# Patient Record
Sex: Male | Born: 1975 | Race: Black or African American | Hispanic: No | Marital: Single
Health system: Southern US, Community
[De-identification: ages and names within clinical notes are randomized; demographics above are authoritative.]

---

## 2020-06-29 ENCOUNTER — Inpatient Hospital Stay
Admission: RE | Admit: 2020-06-29 | Discharge: 2020-07-17 | Disposition: A | Discharge status: Other Acute Inpatient Hospital | Payer: Medicare Other | Source: Other Acute Inpatient Hospital | Attending: Internal Medicine | Admitting: Internal Medicine

## 2020-06-29 ENCOUNTER — Other Ambulatory Visit (HOSPITAL_COMMUNITY): Payer: Medicare Other

## 2020-06-29 DIAGNOSIS — I5032 Chronic diastolic (congestive) heart failure: Secondary | ICD-10-CM

## 2020-06-29 DIAGNOSIS — J9621 Acute and chronic respiratory failure with hypoxia: Secondary | ICD-10-CM

## 2020-06-29 DIAGNOSIS — Z992 Dependence on renal dialysis: Secondary | ICD-10-CM

## 2020-06-29 DIAGNOSIS — Z4659 Encounter for fitting and adjustment of other gastrointestinal appliance and device: Secondary | ICD-10-CM

## 2020-06-29 DIAGNOSIS — Z452 Encounter for adjustment and management of vascular access device: Secondary | ICD-10-CM

## 2020-06-29 DIAGNOSIS — N186 End stage renal disease: Secondary | ICD-10-CM

## 2020-06-29 DIAGNOSIS — J9690 Respiratory failure, unspecified, unspecified whether with hypoxia or hypercapnia: Secondary | ICD-10-CM

## 2020-06-29 DIAGNOSIS — J181 Lobar pneumonia, unspecified organism: Secondary | ICD-10-CM

## 2020-06-29 DIAGNOSIS — G9341 Metabolic encephalopathy: Secondary | ICD-10-CM

## 2020-06-29 LAB — CBC
HCT: 28.2 % — ABNORMAL LOW (ref 39.0–52.0)
Hemoglobin: 8.7 g/dL — ABNORMAL LOW (ref 13.0–17.0)
MCH: 21.9 pg — ABNORMAL LOW (ref 26.0–34.0)
MCHC: 30.9 g/dL (ref 30.0–36.0)
MCV: 70.9 fL — ABNORMAL LOW (ref 80.0–100.0)
Platelets: 134 10*3/uL — ABNORMAL LOW (ref 150–400)
RBC: 3.98 MIL/uL — ABNORMAL LOW (ref 4.22–5.81)
RDW: 15.2 % (ref 11.5–15.5)
WBC: 12.6 10*3/uL — ABNORMAL HIGH (ref 4.0–10.5)
nRBC: 0 % (ref 0.0–0.2)

## 2020-06-29 LAB — COMPREHENSIVE METABOLIC PANEL
ALT: 24 U/L (ref 0–44)
AST: 33 U/L (ref 15–41)
Albumin: 2.2 g/dL — ABNORMAL LOW (ref 3.5–5.0)
Alkaline Phosphatase: 58 U/L (ref 38–126)
Anion gap: 17 — ABNORMAL HIGH (ref 5–15)
BUN: 73 mg/dL — ABNORMAL HIGH (ref 6–20)
CO2: 26 mmol/L (ref 22–32)
Calcium: 8.5 mg/dL — ABNORMAL LOW (ref 8.9–10.3)
Chloride: 83 mmol/L — ABNORMAL LOW (ref 98–111)
Creatinine, Ser: 8.05 mg/dL — ABNORMAL HIGH (ref 0.61–1.24)
GFR, Estimated: 8 mL/min — ABNORMAL LOW (ref >60)
Glucose, Bld: 129 mg/dL — ABNORMAL HIGH (ref 70–99)
Potassium: 4.7 mmol/L (ref 3.5–5.1)
Sodium: 126 mmol/L — ABNORMAL LOW (ref 135–145)
Total Bilirubin: 0.6 mg/dL (ref 0.3–1.2)
Total Protein: 6 g/dL — ABNORMAL LOW (ref 6.5–8.1)

## 2020-06-29 LAB — BLOOD GAS, ARTERIAL
Acid-Base Excess: 2.8 mmol/L — ABNORMAL HIGH (ref 0.0–2.0)
Bicarbonate: 26.4 mmol/L (ref 20.0–28.0)
FIO2: 28
O2 Saturation: 98.5 %
Patient temperature: 36.7
pCO2 arterial: 37.2 mmHg (ref 32.0–48.0)
pH, Arterial: 7.463 — ABNORMAL HIGH (ref 7.350–7.450)
pO2, Arterial: 115 mmHg — ABNORMAL HIGH (ref 83.0–108.0)

## 2020-06-29 LAB — TSH: TSH: 5.995 u[IU]/mL — ABNORMAL HIGH (ref 0.350–4.500)

## 2020-06-30 DIAGNOSIS — J9621 Acute and chronic respiratory failure with hypoxia: Secondary | ICD-10-CM | POA: Diagnosis not present

## 2020-06-30 DIAGNOSIS — N186 End stage renal disease: Secondary | ICD-10-CM | POA: Diagnosis not present

## 2020-06-30 DIAGNOSIS — I5032 Chronic diastolic (congestive) heart failure: Secondary | ICD-10-CM

## 2020-06-30 DIAGNOSIS — G9341 Metabolic encephalopathy: Secondary | ICD-10-CM | POA: Diagnosis not present

## 2020-06-30 DIAGNOSIS — J181 Lobar pneumonia, unspecified organism: Secondary | ICD-10-CM

## 2020-06-30 DIAGNOSIS — Z992 Dependence on renal dialysis: Secondary | ICD-10-CM

## 2020-06-30 LAB — HEPATITIS B CORE ANTIBODY, TOTAL: Hep B Core Total Ab: NONREACTIVE

## 2020-06-30 LAB — HEPATITIS B SURFACE ANTIGEN: Hepatitis B Surface Ag: NONREACTIVE

## 2020-06-30 NOTE — Consult Note (Signed)
[facility] [place]  Date of Service: 06/30/2020  PULMONARY CRITICAL CARE CONSULT   DRIN KISHIMOTO  [id number]  DOB: 07-14-1975   DOA: 06/29/2020  Referring Physician: [name], MD  HPI: Jeffery Andrews is a 45 y.o. male seen for follow up of Acute on Chronic Respiratory Failure.  Patient has multiple medical problems including end-stage renal disease congestive heart failure hypertension diabetes came into the hospital because of altered mental status.  At the time of evaluation patient was intubated placed on mechanical ventilation was noted to be in hyperglycemia.  Patient had a prolonged course ended up remaining on the ventilator and was not able to come off so therefore transferred to our facility for further management.  At time of evaluation here patient is on full support and respiratory therapy is check the RSB and mechanics to try to start with the weaning.  Review of Systems:  ROS performed and is unremarkable other than noted above.  Past medical history: Hypertension Congestive heart failure Diabetes mellitus End-stage renal disease Encephalopathy  Social history: Unknown tobacco alcohol drug use  Family history: Noncontributory to present illness  Medications: Reviewed on Rounds  Physical Exam:  Vitals: Temperature is 97.7 pulse 67 respiratory 14 blood pressure is 1.7/67 saturations 100%  Ventilator Settings on pressure support FiO2 is 28% pressure 12/5  General: Comfortable at this time Eyes: Grossly normal lids, irises & conjunctiva ENT: grossly tongue is normal Neck: no obvious mass Cardiovascular: S1-S2 normal no gallop or rub Respiratory: No rhonchi very coarse breath Abdomen: Soft and nontender Skin: no rash seen on limited exam Musculoskeletal: not rigid Psychiatric:unable to assess Neurologic: no seizure no involuntary movements         Labs on Admission:  Basic  Metabolic Panel: Recent Labs  Lab 06/29/20 2137  NA 126*  K 4.7  CL 83*  CO2 26  GLUCOSE 129*  BUN 73*  CREATININE 8.05*  CALCIUM 8.5*    Recent Labs  Lab 06/29/20 2220  PHART 7.463*  PCO2ART 37.2  PO2ART 115*  HCO3 26.4  O2SAT 98.5    Liver Function Tests: Recent Labs  Lab 06/29/20 2137  AST 33  ALT 24  ALKPHOS 58  BILITOT 0.6  PROT 6.0*  ALBUMIN 2.2*   No results for input(s): LIPASE, AMYLASE in the last 168 hours. No results for input(s): AMMONIA in the last 168 hours.  CBC: Recent Labs  Lab 06/29/20 2137  WBC 12.6*  HGB 8.7*  HCT 28.2*  MCV 70.9*  PLT 134*    Cardiac Enzymes: No results for input(s): CKTOTAL, CKMB, CKMBINDEX, TROPONINI in the last 168 hours.  BNP (last 3 results) No results for input(s): BNP in the last 8760 hours.  ProBNP (last 3 results) No results for input(s): PROBNP in the last 8760 hours.   Radiological Exams on Admission: DG CHEST PORT 1 VIEW  Result Date: 06/29/2020 CLINICAL DATA:  Intubation EXAM: PORTABLE CHEST - 1 VIEW PORTABLE ABDOMEN - 1 VIEW COMPARISON:  None. FINDINGS: *Endotracheal tube tip terminates 5.8 cm from the carina. *Transesophageal tube tip and side port are distal to the GE junction, coiling in the gastric lumen with the tip redirected towards the gastric fundus. *Telemetry leads overlie the chest. Patchy ill-defined opacities are present in the right lung base, could early airspace disease or atelectasis. No pneumothorax or visible effusion. Cardiomediastinal contours are unremarkable for the portable technique. No suspicious abdominal calcifications or obstructive bowel gas pattern within  the included portions of the upper abdomen. No acute osseous abnormality or suspicious osseous lesion. IMPRESSION: Lines and tubes as above. Patchy opacity in the right lower lung, possible atelectasis or early consolidation. Electronically Signed   By: Lovena Le M.D.   On: 06/29/2020 22:20   DG Abd Portable  1V  Result Date: 06/29/2020 CLINICAL DATA:  Intubation EXAM: PORTABLE CHEST - 1 VIEW PORTABLE ABDOMEN - 1 VIEW COMPARISON:  None. FINDINGS: *Endotracheal tube tip terminates 5.8 cm from the carina. *Transesophageal tube tip and side port are distal to the GE junction, coiling in the gastric lumen with the tip redirected towards the gastric fundus. *Telemetry leads overlie the chest. Patchy ill-defined opacities are present in the right lung base, could early airspace disease or atelectasis. No pneumothorax or visible effusion. Cardiomediastinal contours are unremarkable for the portable technique. No suspicious abdominal calcifications or obstructive bowel gas pattern within the included portions of the upper abdomen. No acute osseous abnormality or suspicious osseous lesion. IMPRESSION: Lines and tubes as above. Patchy opacity in the right lower lung, possible atelectasis or early consolidation. Electronically Signed   By: Lovena Le M.D.   On: 06/29/2020 22:20    Assessment/Plan Active Problems:   Acute on chronic respiratory failure with hypoxia (HCC)   End stage renal disease on dialysis (HCC)   Lobar pneumonia, unspecified organism (HCC)   Acute metabolic encephalopathy   Chronic diastolic CHF (congestive heart failure) (HCC)   Acute on chronic respiratory failure with hypoxia plan is going to be to continue with pressure support weaning right now is on pressure support of 12/5 goal is for 2 hours today to start we will see how the patient does with the attempts at weaning. End-stage renal disease will be continued on hemodialysis nephrology will be asked to see the patient. Chronic heart failure diastolic continue to monitor patient's fluid status closely diurese as tolerated. Lobar pneumonia chest x-ray shows patchy infiltrates in the right lower lung zone.  Patient has been treated with antibiotics.  Will discuss further with primary team. Metabolic encephalopathy overall no change we will  continue with supportive care  I have personally seen and evaluated the patient, evaluated laboratory and imaging results, formulated the assessment and plan and placed orders. The Patient requires high complexity decision making with multiple systems involvement.  Case was discussed on Rounds with the Respiratory Therapy Director and the Respiratory staff Time Spent 61mnutes  Raileigh Sabater A Kesler Wickham, MD [facility] CenterPulmonary [facility] Medicine Sleep Medicine

## 2020-07-01 ENCOUNTER — Other Ambulatory Visit (HOSPITAL_COMMUNITY): Payer: Medicare Other

## 2020-07-01 DIAGNOSIS — G9341 Metabolic encephalopathy: Secondary | ICD-10-CM | POA: Diagnosis not present

## 2020-07-01 DIAGNOSIS — J9621 Acute and chronic respiratory failure with hypoxia: Secondary | ICD-10-CM | POA: Diagnosis not present

## 2020-07-01 DIAGNOSIS — I5032 Chronic diastolic (congestive) heart failure: Secondary | ICD-10-CM

## 2020-07-01 DIAGNOSIS — J181 Lobar pneumonia, unspecified organism: Secondary | ICD-10-CM

## 2020-07-01 DIAGNOSIS — N186 End stage renal disease: Secondary | ICD-10-CM

## 2020-07-01 LAB — HEMOGLOBIN A1C
Hgb A1c MFr Bld: 13.5 % — ABNORMAL HIGH (ref 4.8–5.6)
Mean Plasma Glucose: 341 mg/dL

## 2020-07-01 LAB — HEPATITIS B SURFACE ANTIBODY,QUALITATIVE: Hep B S Ab: REACTIVE — AB

## 2020-07-01 NOTE — Consult Note (Signed)
[facility] CONSULT NOTE    Date: 07/01/2020                  Patient Name:  Jeffery Andrews  MRN: [id number]  DOB: September 22, 1975  Age / Sex: 45 y.o., male         PCP: Jacqualine Code, DO                 Service Requesting Consult: Hospitalist                 Reason for Consult: Evaluation management of ESRD            History of Present Illness: Patient is a 45 y.o. male with a PMHx of ESRD on HD, posterior reversible encephalopathy syndrome, diabetes mellitus type 2, chronic systolic heart failure, anemia of chronic kidney disease, secondary hyperparathyroidism, malignant hypertension, acute respiratory failure, who was admitted to [facility] on 06/29/2020 for ongoing treatment of acute respiratory failure, posterior reversible encephalopathy syndrome, and end-stage renal disease.  Patient was admitted to [facility] through the emergency department for elevated blood sugar and confusion.  His initial blood pressure there was 289/152.  Given worsening agitation and confusion he had to be intubated.  He was admitted to the intensive care unit.  He was placed on a Cardene drip initially.  He was also found to have labile blood sugars.  CT of the brain on 06/24/2020 showed cerebral edema.  In addition imaging showed probable posterior reversible encephalopathy syndrome.  His hemodialysis treatments were continued.   Medications: Sliding scale insulin with Humalog, amlodipine 10 mg daily, clonidine 0.1 mg nightly, famotidine 10 mg daily, heparin 5000 units subcutaneous every 12 hours, hydralazine 100 mg 3 times daily, Juven 1 packet 3 times daily, labetalol 200 mg twice daily, Lantus 20 units nightly, losartan 50 mg daily, sertraline 200 mg daily   Allergies: No known drug allergies   Past Medical History: ESRD on HD, posterior reversible encephalopathy syndrome, diabetes mellitus type 2, chronic systolic heart failure, anemia of  chronic kidney disease, secondary hyperparathyroidism, malignant hypertension, acute respiratory failure   Past Surgical History: Status post AV fistula placement  Family History: Unable to obtain as the patient is currently intubated.   Social History: Unable to obtain as the patient is currently intubated.  Review of Systems: Unable to obtain as the patient is currently intubated.  Vital Signs: Temperature nine 9.1 pulse 66 respirations 15 blood pressure 161/79  Weight trends: There were no vitals filed for this visit.  Physical Exam: Physical Exam: General: Critically ill-appearing  Head:  Normocephalic, atraumatic.  Endotracheal tube in place  Eyes:  Anicteric  Neck:  Supple  Lungs:   Clear to auscultation, normal effort  Heart:  S1S2 no rubs  Abdomen:   Soft, nontender, bowel sounds present  Extremities: Trace peripheral edema.  Neurologic: Eyes are open however patient not following commands  Skin:  No acute skin rash  Access: AV fistula in place    Lab results: Basic Metabolic Panel: Recent Labs  Lab 06/29/20 2137  NA 126*  K 4.7  CL 83*  CO2 26  GLUCOSE 129*  BUN 73*  CREATININE 8.05*  CALCIUM 8.5*    Liver Function Tests: Recent Labs  Lab 06/29/20 2137  AST 33  ALT 24  ALKPHOS 58  BILITOT 0.6  PROT 6.0*  ALBUMIN 2.2*   No results for input(s): LIPASE, AMYLASE in the last 168 hours. No results for  input(s): AMMONIA in the last 168 hours.  CBC: Recent Labs  Lab 06/29/20 2137  WBC 12.6*  HGB 8.7*  HCT 28.2*  MCV 70.9*  PLT 134*    Cardiac Enzymes: No results for input(s): CKTOTAL, CKMB, CKMBINDEX, TROPONINI in the last 168 hours.  BNP: Invalid input(s): POCBNP  CBG: No results for input(s): GLUCAP in the last 168 hours.  Microbiology: Results for orders placed or performed during the hospital encounter of 06/29/20  Culture, Respiratory w Gram Stain     Status: None (Preliminary result)   Collection Time: 06/30/20  4:00 PM    Specimen: Tracheal Aspirate; Respiratory  Result Value Ref Range Status   Specimen Description TRACHEAL ASPIRATE  Final   Special Requests NONE  Final   Gram Stain   Final    FEW WBC PRESENT,BOTH PMN AND MONONUCLEAR MODERATE GRAM NEGATIVE COCCOBACILLI RARE GRAM POSITIVE COCCI IN PAIRS FEW GRAM POSITIVE RODS Performed at [facility], [place] [address] [place], [place] [address]    Culture PENDING  Incomplete   Report Status PENDING  Incomplete    Coagulation Studies: No results for input(s): LABPROT, INR in the last 72 hours.  Urinalysis: No results for input(s): COLORURINE, LABSPEC, PHURINE, GLUCOSEU, HGBUR, BILIRUBINUR, KETONESUR, PROTEINUR, UROBILINOGEN, NITRITE, LEUKOCYTESUR in the last 72 hours.  Invalid input(s): APPERANCEUR    Imaging: DG CHEST PORT 1 VIEW  Result Date: 06/29/2020 CLINICAL DATA:  Intubation EXAM: PORTABLE CHEST - 1 VIEW PORTABLE ABDOMEN - 1 VIEW COMPARISON:  None. FINDINGS: *Endotracheal tube tip terminates 5.8 cm from the carina. *Transesophageal tube tip and side port are distal to the GE junction, coiling in the gastric lumen with the tip redirected towards the gastric fundus. *Telemetry leads overlie the chest. Patchy ill-defined opacities are present in the right lung base, could early airspace disease or atelectasis. No pneumothorax or visible effusion. Cardiomediastinal contours are unremarkable for the portable technique. No suspicious abdominal calcifications or obstructive bowel gas pattern within the included portions of the upper abdomen. No acute osseous abnormality or suspicious osseous lesion. IMPRESSION: Lines and tubes as above. Patchy opacity in the right lower lung, possible atelectasis or early consolidation. Electronically Signed   By: Lovena Le M.D.   On: 06/29/2020 22:20   DG Abd Portable 1V  Result Date: 06/29/2020 CLINICAL DATA:  Intubation EXAM: PORTABLE CHEST - 1 VIEW PORTABLE ABDOMEN - 1 VIEW COMPARISON:  None. FINDINGS:  *Endotracheal tube tip terminates 5.8 cm from the carina. *Transesophageal tube tip and side port are distal to the GE junction, coiling in the gastric lumen with the tip redirected towards the gastric fundus. *Telemetry leads overlie the chest. Patchy ill-defined opacities are present in the right lung base, could early airspace disease or atelectasis. No pneumothorax or visible effusion. Cardiomediastinal contours are unremarkable for the portable technique. No suspicious abdominal calcifications or obstructive bowel gas pattern within the included portions of the upper abdomen. No acute osseous abnormality or suspicious osseous lesion. IMPRESSION: Lines and tubes as above. Patchy opacity in the right lower lung, possible atelectasis or early consolidation. Electronically Signed   By: Lovena Le M.D.   On: 06/29/2020 22:20     Assessment & Plan: Pt is a 45 y.o. male with a PMHx of ESRD on HD, posterior reversible encephalopathy syndrome, diabetes mellitus type 2, chronic systolic heart failure, anemia of chronic kidney disease, secondary hyperparathyroidism, malignant hypertension, acute respiratory failure, who was admitted to [facility] on 06/29/2020 for ongoing treatment of acute respiratory failure, posterior  reversible encephalopathy syndrome, and end-stage renal disease.   1.  ESRD on HD.  We will maintain the patient on TTS dialysis schedule while here at our facility.  Continue to use AV fistula.  2.  Anemia of chronic kidney disease.  Hemoglobin currently 8.7.  Start the patient on Retacrit 4000 units IV with dialysis treatments.  3.  Secondary hyperparathyroidism.  Monitor calcium, phosphorus over the course of the hospitalization.  Not on binders at the moment.  4.  Acute respiratory failure.  Maintained on ventilatory support.  Weaning as per pulmonary/critical care.  Encephalopathy likely playing some role in the ability to extubate the patient.  5.  Hypertension.  Currently on  amlodipine, clonidine, hydralazine, labetalol, and losartan.  Monitor blood pressure trend.

## 2020-07-01 NOTE — Progress Notes (Signed)
Jeffery Andrews   [facility] CRITICAL CARE SERVICE  PROGRESS NOTE     Jeffery Andrews  [id number]  DOB: 02-18-1975   DOA: 06/29/2020  Referring Physician: [name], MD  HPI: Jeffery Andrews is a 45 y.o. male being followed for ventilator/airway/oxygen weaning Acute on Chronic Respiratory Failure.  Patient is currently on pressure support 10/5 good volumes are noted.  Respiratory therapy is going to advance the weaning further  Medications: Reviewed on Rounds  Physical Exam:  Vitals: Temperature is 99.1 pulse 66 respiratory 15 blood pressure is 161/79 saturations 100%  Ventilator Settings on pressure support 10/5  General: Comfortable at this time Neck: supple Cardiovascular: no malignant arrhythmias Respiratory: No rhonchi very coarse breath sound Skin: no rash seen on limited exam Musculoskeletal: No gross abnormality Psychiatric:unable to assess Neurologic:no involuntary movements         Lab Data:   Basic Metabolic Panel: Recent Labs  Lab 06/29/20 2137  NA 126*  K 4.7  CL 83*  CO2 26  GLUCOSE 129*  BUN 73*  CREATININE 8.05*  CALCIUM 8.5*    ABG: Recent Labs  Lab 06/29/20 2220  PHART 7.463*  PCO2ART 37.2  PO2ART 115*  HCO3 26.4  O2SAT 98.5    Liver Function Tests: Recent Labs  Lab 06/29/20 2137  AST 33  ALT 24  ALKPHOS 58  BILITOT 0.6  PROT 6.0*  ALBUMIN 2.2*   No results for input(s): LIPASE, AMYLASE in the last 168 hours. No results for input(s): AMMONIA in the last 168 hours.  CBC: Recent Labs  Lab 06/29/20 2137  WBC 12.6*  HGB 8.7*  HCT 28.2*  MCV 70.9*  PLT 134*    Cardiac Enzymes: No results for input(s): CKTOTAL, CKMB, CKMBINDEX, TROPONINI in the last 168 hours.  BNP (last 3 results) No results for input(s): BNP in the last 8760 hours.  ProBNP (last 3 results) No results for input(s): PROBNP in the last 8760 hours.  Radiological Exams: DG CHEST PORT 1  VIEW  Result Date: 06/29/2020 CLINICAL DATA:  Intubation EXAM: PORTABLE CHEST - 1 VIEW PORTABLE ABDOMEN - 1 VIEW COMPARISON:  None. FINDINGS: *Endotracheal tube tip terminates 5.8 cm from the carina. *Transesophageal tube tip and side port are distal to the GE junction, coiling in the gastric lumen with the tip redirected towards the gastric fundus. *Telemetry leads overlie the chest. Patchy ill-defined opacities are present in the right lung base, could early airspace disease or atelectasis. No pneumothorax or visible effusion. Cardiomediastinal contours are unremarkable for the portable technique. No suspicious abdominal calcifications or obstructive bowel gas pattern within the included portions of the upper abdomen. No acute osseous abnormality or suspicious osseous lesion. IMPRESSION: Lines and tubes as above. Patchy opacity in the right lower lung, possible atelectasis or early consolidation. Electronically Signed   By: Lovena Le M.D.   On: 06/29/2020 22:20   DG Abd Portable 1V  Result Date: 06/29/2020 CLINICAL DATA:  Intubation EXAM: PORTABLE CHEST - 1 VIEW PORTABLE ABDOMEN - 1 VIEW COMPARISON:  None. FINDINGS: *Endotracheal tube tip terminates 5.8 cm from the carina. *Transesophageal tube tip and side port are distal to the GE junction, coiling in the gastric lumen with the tip redirected towards the gastric fundus. *Telemetry leads overlie the chest. Patchy ill-defined opacities are present in the right lung base, could early airspace disease or atelectasis. No pneumothorax or visible effusion. Cardiomediastinal contours are unremarkable for the portable technique. No suspicious abdominal calcifications or  obstructive bowel gas pattern within the included portions of the upper abdomen. No acute osseous abnormality or suspicious osseous lesion. IMPRESSION: Lines and tubes as above. Patchy opacity in the right lower lung, possible atelectasis or early consolidation. Electronically Signed   By: Lovena Le M.D.   On: 06/29/2020 22:20    Assessment/Plan Active Problems:   Acute on chronic respiratory failure with hypoxia (HCC)   End stage renal disease on dialysis (HCC)   Lobar pneumonia, unspecified organism (HCC)   Acute metabolic encephalopathy   Chronic diastolic CHF (congestive heart failure) (HCC)   Acute on chronic respiratory failure hypoxia we will continue with pressure support 10/5 tidal volume 550 as tolerated.  Patient should be able to advance to T collar soon End-stage renal failure on hemodialysis being seen by nephrology we will continue with their recommendations Metabolic encephalopathy no change Chronic diastolic heart failure appears to be compensated at this time. Lobar pneumonia chest x-ray results as noted above   I have personally seen and evaluated the patient, evaluated laboratory and imaging results, formulated the assessment and plan and placed orders. The Patient requires high complexity decision making with multiple systems involvement.  Rounds were done with the Respiratory Therapy Director and Staff therapists and discussed with nursing staff also.  Jeffery Gee, MD [facility] Medicine Sleep Medicine

## 2020-07-02 ENCOUNTER — Other Ambulatory Visit (HOSPITAL_COMMUNITY): Payer: Medicare Other

## 2020-07-02 LAB — RENAL FUNCTION PANEL
Albumin: 2 g/dL — ABNORMAL LOW (ref 3.5–5.0)
Anion gap: 15 (ref 5–15)
BUN: 92 mg/dL — ABNORMAL HIGH (ref 6–20)
CO2: 24 mmol/L (ref 22–32)
Calcium: 8.4 mg/dL — ABNORMAL LOW (ref 8.9–10.3)
Chloride: 90 mmol/L — ABNORMAL LOW (ref 98–111)
Creatinine, Ser: 7.34 mg/dL — ABNORMAL HIGH (ref 0.61–1.24)
GFR, Estimated: 9 mL/min — ABNORMAL LOW (ref >60)
Glucose, Bld: 77 mg/dL (ref 70–99)
Phosphorus: 4.9 mg/dL — ABNORMAL HIGH (ref 2.5–4.6)
Potassium: 5.1 mmol/L (ref 3.5–5.1)
Sodium: 129 mmol/L — ABNORMAL LOW (ref 135–145)

## 2020-07-02 LAB — CBC
HCT: 28.6 % — ABNORMAL LOW (ref 39.0–52.0)
Hemoglobin: 8.8 g/dL — ABNORMAL LOW (ref 13.0–17.0)
MCH: 22.1 pg — ABNORMAL LOW (ref 26.0–34.0)
MCHC: 30.8 g/dL (ref 30.0–36.0)
MCV: 71.7 fL — ABNORMAL LOW (ref 80.0–100.0)
Platelets: 167 10*3/uL (ref 150–400)
RBC: 3.99 MIL/uL — ABNORMAL LOW (ref 4.22–5.81)
RDW: 15.4 % (ref 11.5–15.5)
WBC: 15 10*3/uL — ABNORMAL HIGH (ref 4.0–10.5)
nRBC: 0 % (ref 0.0–0.2)

## 2020-07-02 NOTE — Progress Notes (Signed)
  Echocardiogram 2D Echocardiogram with 3D has been performed. Dr. Doylene Canard contacted at 11:12 to read echo.  Jeffery Andrews M 07/02/2020, 11:13 AM

## 2020-07-03 DIAGNOSIS — J9621 Acute and chronic respiratory failure with hypoxia: Secondary | ICD-10-CM | POA: Diagnosis not present

## 2020-07-03 DIAGNOSIS — I5032 Chronic diastolic (congestive) heart failure: Secondary | ICD-10-CM | POA: Diagnosis not present

## 2020-07-03 DIAGNOSIS — G9341 Metabolic encephalopathy: Secondary | ICD-10-CM | POA: Diagnosis not present

## 2020-07-03 DIAGNOSIS — N186 End stage renal disease: Secondary | ICD-10-CM | POA: Diagnosis not present

## 2020-07-03 LAB — ECHOCARDIOGRAM COMPLETE
Area-P 1/2: 3.15 cm2
S' Lateral: 2.7 cm

## 2020-07-03 NOTE — Progress Notes (Signed)
[facility] [place]   PULMONARY CRITICAL CARE SERVICE  PROGRESS NOTE     Jeffery Andrews  [id number]  DOB: Mar 11, 1975   DOA: 06/29/2020  Referring Physician: [name], MD  HPI: Jeffery Andrews is a 45 y.o. male being followed for ventilator/airway/oxygen weaning Acute on Chronic Respiratory Failure.  Patient is currently on pressure support has been on 28% FiO2 and a pressure of 10/5 remains nonverbal and nonresponsive  Medications: Reviewed on Rounds  Physical Exam:  Vitals: Temperature 98.0 pulse 69 respiratory rate is 17 blood pressure 166/80 saturations 100%  Ventilator Settings on pressure support FiO2 28% pressure 10/5  General: Comfortable at this time Neck: supple Cardiovascular: no malignant arrhythmias Respiratory: No rhonchi or rales are noted at this time Skin: no rash seen on limited exam Musculoskeletal: No gross abnormality Psychiatric:unable to assess Neurologic:no involuntary movements         Lab Data:   Basic Metabolic Panel: Recent Labs  Lab 06/29/20 2137 07/02/20 1221  NA 126* 129*  K 4.7 5.1  CL 83* 90*  CO2 26 24  GLUCOSE 129* 77  BUN 73* 92*  CREATININE 8.05* 7.34*  CALCIUM 8.5* 8.4*  PHOS  --  4.9*    ABG: Recent Labs  Lab 06/29/20 2220  PHART 7.463*  PCO2ART 37.2  PO2ART 115*  HCO3 26.4  O2SAT 98.5    Liver Function Tests: Recent Labs  Lab 06/29/20 2137 07/02/20 1221  AST 33  --   ALT 24  --   ALKPHOS 58  --   BILITOT 0.6  --   PROT 6.0*  --   ALBUMIN 2.2* 2.0*   No results for input(s): LIPASE, AMYLASE in the last 168 hours. No results for input(s): AMMONIA in the last 168 hours.  CBC: Recent Labs  Lab 06/29/20 2137 07/02/20 1221  WBC 12.6* 15.0*  HGB 8.7* 8.8*  HCT 28.2* 28.6*  MCV 70.9* 71.7*  PLT 134* 167    Cardiac Enzymes: No results for input(s): CKTOTAL, CKMB, CKMBINDEX, TROPONINI in the last 168 hours.  BNP (last 3 results) No  results for input(s): BNP in the last 8760 hours.  ProBNP (last 3 results) No results for input(s): PROBNP in the last 8760 hours.  Radiological Exams: No results found.  Assessment/Plan Active Problems:   Acute on chronic respiratory failure with hypoxia (HCC)   End stage renal disease on dialysis (HCC)   Lobar pneumonia, unspecified organism (HCC)   Acute metabolic encephalopathy   Chronic diastolic CHF (congestive heart failure) (HCC)   Acute on chronic respiratory failure hypoxia plan is to continue with pressure support titrate oxygen as tolerated continue pulmonary toilet patient remains nonresponsive and there is a question whether they will be able to actually protect airway will likely end up needing to have a tracheostomy End-stage renal disease on hemodialysis patient is being seen by nephrology Lobar pneumonia has been treated with antibiotics we will continue with supportive care Metabolic encephalopathy remains nonresponsive Chronic diastolic heart failure echocardiogram was done   I have personally seen and evaluated the patient, evaluated laboratory and imaging results, formulated the assessment and plan and placed orders. The Patient requires high complexity decision making with multiple systems involvement.  Rounds were done with the Respiratory Therapy Director and Staff therapists and discussed with nursing staff also.  Allyne Gee, MD [facility] Pulmonary Critical Care Medicine Sleep Medicine

## 2020-07-03 NOTE — Progress Notes (Signed)
[facility]  ROUNDING NOTE   Subjective:  Patient completed dialysis early this AM. Still on the ventilator. Blood pressure currently 166/80.   Objective:  Vital signs in last 24 hours:  Temperature 97 pulse 69 respirations 14 blood pressure 166/80   Physical Exam: General:  Critically ill-appearing  Head:  Normocephalic, atraumatic.  Endotracheal tube in place  Eyes:  Anicteric  Neck:  Supple  Lungs:   Clear to auscultation, vent assisted  Heart:  S1S2 no rubs  Abdomen:   Soft, nontender, bowel sounds present  Extremities:  Trace peripheral edema.  Neurologic:  Intubated, not following commands  Skin:  No acute skin rash  Access:  Left upper extremity AV fistula    Basic Metabolic Panel: Recent Labs  Lab 06/29/20 2137 07/02/20 1221  NA 126* 129*  K 4.7 5.1  CL 83* 90*  CO2 26 24  GLUCOSE 129* 77  BUN 73* 92*  CREATININE 8.05* 7.34*  CALCIUM 8.5* 8.4*  PHOS  --  4.9*    Liver Function Tests: Recent Labs  Lab 06/29/20 2137 07/02/20 1221  AST 33  --   ALT 24  --   ALKPHOS 58  --   BILITOT 0.6  --   PROT 6.0*  --   ALBUMIN 2.2* 2.0*   No results for input(s): LIPASE, AMYLASE in the last 168 hours. No results for input(s): AMMONIA in the last 168 hours.  CBC: Recent Labs  Lab 06/29/20 2137 07/02/20 1221  WBC 12.6* 15.0*  HGB 8.7* 8.8*  HCT 28.2* 28.6*  MCV 70.9* 71.7*  PLT 134* 167    Cardiac Enzymes: No results for input(s): CKTOTAL, CKMB, CKMBINDEX, TROPONINI in the last 168 hours.  BNP: Invalid input(s): POCBNP  CBG: No results for input(s): GLUCAP in the last 168 hours.  Microbiology: Results for orders placed or performed during the hospital encounter of 06/29/20  Culture, Respiratory w Gram Stain     Status: None (Preliminary result)   Collection Time: 06/30/20  4:00 PM   Specimen: Tracheal Aspirate; Respiratory  Result Value Ref Range Status   Specimen Description TRACHEAL ASPIRATE  Final   Special Requests NONE   Final   Gram Stain   Final    FEW WBC PRESENT,BOTH PMN AND MONONUCLEAR MODERATE GRAM NEGATIVE COCCOBACILLI RARE GRAM POSITIVE COCCI IN PAIRS FEW GRAM POSITIVE RODS    Culture   Final    ABUNDANT HAEMOPHILUS INFLUENZAE CULTURE REINCUBATED FOR BETTER GROWTH Performed at [facility], [place] [address] [place], [place] [address]    Report Status PENDING  Incomplete    Coagulation Studies: No results for input(s): LABPROT, INR in the last 72 hours.  Urinalysis: No results for input(s): COLORURINE, LABSPEC, PHURINE, GLUCOSEU, HGBUR, BILIRUBINUR, KETONESUR, PROTEINUR, UROBILINOGEN, NITRITE, LEUKOCYTESUR in the last 72 hours.  Invalid input(s): APPERANCEUR    Imaging: No results found.   Medications:       Assessment/ Plan:  45 y.o. male with a PMHx of ESRD on HD, posterior reversible encephalopathy syndrome, diabetes mellitus type 2, chronic systolic heart failure, anemia of chronic kidney disease, secondary hyperparathyroidism, malignant hypertension, acute respiratory failure, who was admitted to [facility] on 06/29/2020 for ongoing treatment of acute respiratory failure, posterior reversible encephalopathy syndrome, and end-stage renal disease.   1.  ESRD on HD.  We will plan to maintain the patient on TTS dialysis schedule.  Next dialysis treatment for tomorrow.  2.  Anemia of chronic kidney disease.  Hemoglobin currently 8.8.  Start  Retacrit 4000 units IV with dialysis treatments.  3.  Secondary hyperparathyroidism.  Serum phosphorus currently 4.9 and acceptable.  Continue to monitor bone mineral metabolism parameters.  4.  Acute respiratory failure.  Continue ventilatory support at this time.  5.  Hypertension.  Blood pressure currently 166/80.  Continue amlodipine, clonidine, hydralazine, at current doses.  Increase losartan to '100mg'$  daily.     LOS: 0 Jeffery Andrews 7/1/20229:44 AM

## 2020-07-04 LAB — RENAL FUNCTION PANEL
Albumin: 2 g/dL — ABNORMAL LOW (ref 3.5–5.0)
Anion gap: 11 (ref 5–15)
BUN: 83 mg/dL — ABNORMAL HIGH (ref 6–20)
CO2: 27 mmol/L (ref 22–32)
Calcium: 8.6 mg/dL — ABNORMAL LOW (ref 8.9–10.3)
Chloride: 93 mmol/L — ABNORMAL LOW (ref 98–111)
Creatinine, Ser: 6.14 mg/dL — ABNORMAL HIGH (ref 0.61–1.24)
GFR, Estimated: 11 mL/min — ABNORMAL LOW (ref >60)
Glucose, Bld: 98 mg/dL (ref 70–99)
Phosphorus: 4.2 mg/dL (ref 2.5–4.6)
Potassium: 4.5 mmol/L (ref 3.5–5.1)
Sodium: 131 mmol/L — ABNORMAL LOW (ref 135–145)

## 2020-07-04 LAB — CULTURE, RESPIRATORY W GRAM STAIN

## 2020-07-04 LAB — CBC
HCT: 27.8 % — ABNORMAL LOW (ref 39.0–52.0)
Hemoglobin: 8.6 g/dL — ABNORMAL LOW (ref 13.0–17.0)
MCH: 22.4 pg — ABNORMAL LOW (ref 26.0–34.0)
MCHC: 30.9 g/dL (ref 30.0–36.0)
MCV: 72.4 fL — ABNORMAL LOW (ref 80.0–100.0)
Platelets: 183 10*3/uL (ref 150–400)
RBC: 3.84 MIL/uL — ABNORMAL LOW (ref 4.22–5.81)
RDW: 15.9 % — ABNORMAL HIGH (ref 11.5–15.5)
WBC: 16.3 10*3/uL — ABNORMAL HIGH (ref 4.0–10.5)
nRBC: 0 % (ref 0.0–0.2)

## 2020-07-06 DIAGNOSIS — G9341 Metabolic encephalopathy: Secondary | ICD-10-CM | POA: Diagnosis not present

## 2020-07-06 DIAGNOSIS — J9621 Acute and chronic respiratory failure with hypoxia: Secondary | ICD-10-CM | POA: Diagnosis not present

## 2020-07-06 DIAGNOSIS — I5032 Chronic diastolic (congestive) heart failure: Secondary | ICD-10-CM | POA: Diagnosis not present

## 2020-07-06 DIAGNOSIS — N186 End stage renal disease: Secondary | ICD-10-CM | POA: Diagnosis not present

## 2020-07-06 NOTE — Progress Notes (Signed)
[facility]  ROUNDING NOTE   Subjective:  Patient seen and evaluated at bedside. Awake but not following commands. Still on the ventilator at this time.   Objective:  Vital signs in last 24 hours:  Temperature nine 9.3 pulse 94 respirations 23 blood pressure 174/92   Physical Exam: General:  Critically ill-appearing  Head:  Normocephalic, atraumatic.  Endotracheal tube in place  Eyes:  Anicteric  Neck:  Supple  Lungs:   Clear to auscultation, vent assisted  Heart:  S1S2 no rubs  Abdomen:   Soft, nontender, bowel sounds present  Extremities:  Trace peripheral edema.  Neurologic:  Intubated, not following commands  Skin:  No acute skin rash  Access:  Left upper extremity AV fistula    Basic Metabolic Panel: Recent Labs  Lab 06/29/20 2137 07/02/20 1221 07/04/20 0756  NA 126* 129* 131*  K 4.7 5.1 4.5  CL 83* 90* 93*  CO2 '26 24 27  '$ GLUCOSE 129* 77 98  BUN 73* 92* 83*  CREATININE 8.05* 7.34* 6.14*  CALCIUM 8.5* 8.4* 8.6*  PHOS  --  4.9* 4.2     Liver Function Tests: Recent Labs  Lab 06/29/20 2137 07/02/20 1221 07/04/20 0756  AST 33  --   --   ALT 24  --   --   ALKPHOS 58  --   --   BILITOT 0.6  --   --   PROT 6.0*  --   --   ALBUMIN 2.2* 2.0* 2.0*    No results for input(s): LIPASE, AMYLASE in the last 168 hours. No results for input(s): AMMONIA in the last 168 hours.  CBC: Recent Labs  Lab 06/29/20 2137 07/02/20 1221 07/04/20 0756  WBC 12.6* 15.0* 16.3*  HGB 8.7* 8.8* 8.6*  HCT 28.2* 28.6* 27.8*  MCV 70.9* 71.7* 72.4*  PLT 134* 167 183     Cardiac Enzymes: No results for input(s): CKTOTAL, CKMB, CKMBINDEX, TROPONINI in the last 168 hours.  BNP: Invalid input(s): POCBNP  CBG: No results for input(s): GLUCAP in the last 168 hours.  Microbiology: Results for orders placed or performed during the hospital encounter of 06/29/20  Culture, Respiratory w Gram Stain     Status: None   Collection Time: 06/30/20  4:00 PM    Specimen: Tracheal Aspirate; Respiratory  Result Value Ref Range Status   Specimen Description TRACHEAL ASPIRATE  Final   Special Requests NONE  Final   Gram Stain   Final    FEW WBC PRESENT,BOTH PMN AND MONONUCLEAR MODERATE GRAM NEGATIVE COCCOBACILLI RARE GRAM POSITIVE COCCI IN PAIRS FEW GRAM POSITIVE RODS Performed at [facility], [place] [address] [place], [place] [address]    Culture   Final    ABUNDANT HAEMOPHILUS INFLUENZAE BETA LACTAMASE NEGATIVE FEW STAPHYLOCOCCUS AUREUS    Report Status 07/04/2020 FINAL  Final   Organism ID, Bacteria STAPHYLOCOCCUS AUREUS  Final      Susceptibility   Staphylococcus aureus - MIC*    CIPROFLOXACIN <=0.5 SENSITIVE Sensitive     ERYTHROMYCIN >=8 RESISTANT Resistant     GENTAMICIN <=0.5 SENSITIVE Sensitive     OXACILLIN <=0.25 SENSITIVE Sensitive     TETRACYCLINE <=1 SENSITIVE Sensitive     VANCOMYCIN <=0.5 SENSITIVE Sensitive     TRIMETH/SULFA <=10 SENSITIVE Sensitive     CLINDAMYCIN <=0.25 SENSITIVE Sensitive     RIFAMPIN <=0.5 SENSITIVE Sensitive     Inducible Clindamycin NEGATIVE Sensitive     * FEW STAPHYLOCOCCUS AUREUS    Coagulation Studies:  No results for input(s): LABPROT, INR in the last 72 hours.  Urinalysis: No results for input(s): COLORURINE, LABSPEC, PHURINE, GLUCOSEU, HGBUR, BILIRUBINUR, KETONESUR, PROTEINUR, UROBILINOGEN, NITRITE, LEUKOCYTESUR in the last 72 hours.  Invalid input(s): APPERANCEUR    Imaging: No results found.   Medications:       Assessment/ Plan:  45 y.o. male with a PMHx of ESRD on HD, posterior reversible encephalopathy syndrome, diabetes mellitus type 2, chronic systolic heart failure, anemia of chronic kidney disease, secondary hyperparathyroidism, malignant hypertension, acute respiratory failure, who was admitted to [facility] on 06/29/2020 for ongoing treatment of acute respiratory failure, posterior reversible encephalopathy syndrome, and end-stage renal disease.   1.  ESRD  on HD.  Next dialysis treatment scheduled for tomorrow.  Continue dialysis on TTS schedule.  2.  Anemia of chronic kidney disease.  Continue Retacrit 4000 units IV with dialysis treatments.  3.  Secondary hyperparathyroidism.  Phosphorus 4.2 on last check.  Not on binders at the moment.  4.  Acute respiratory failure.  Weaning as per pulmonary/critical care.  5.  Hypertension.  Systolic blood pressure has ranged between the 150s to 170s recently.  Continue amlodipine, clonidine, hydralazine, labetalol, and losartan.  We will also add clonidine patch 0.2 mg transdermal weekly.   LOS: 0 Coulton Schlink 7/4/20228:43 AM

## 2020-07-06 NOTE — Progress Notes (Signed)
Pulmonary [place]   PULMONARY CRITICAL CARE SERVICE  PROGRESS NOTE     REMONE SAPP  [id number]  DOB: 05-Aug-1975   DOA: 06/29/2020  Referring Physician: [name], MD  HPI: HENDRICK SCHIELKE is a 45 y.o. male being followed for ventilator/airway/oxygen weaning Acute on Chronic Respiratory Failure.  Patient is currently on pressure support remains on vent unresponsive nonverbal orally intubated  Medications: Reviewed on Rounds  Physical Exam:  Vitals: Temperature is 99.3 pulse 74 respiratory 23 blood pressure is 179/92 saturations 100%  Ventilator Settings pressure support FiO2 28% pressure 10/5  General: Comfortable at this time Neck: supple Cardiovascular: no malignant arrhythmias Respiratory: No rhonchi very coarse breath sounds Skin: no rash seen on limited exam Musculoskeletal: No gross abnormality Psychiatric:unable to assess Neurologic:no involuntary movements         Lab Data:   Basic Metabolic Panel: Recent Labs  Lab 06/29/20 2137 07/02/20 1221 07/04/20 0756  NA 126* 129* 131*  K 4.7 5.1 4.5  CL 83* 90* 93*  CO2 '26 24 27  '$ GLUCOSE 129* 77 98  BUN 73* 92* 83*  CREATININE 8.05* 7.34* 6.14*  CALCIUM 8.5* 8.4* 8.6*  PHOS  --  4.9* 4.2    ABG: Recent Labs  Lab 06/29/20 2220  PHART 7.463*  PCO2ART 37.2  PO2ART 115*  HCO3 26.4  O2SAT 98.5    Liver Function Tests: Recent Labs  Lab 06/29/20 2137 07/02/20 1221 07/04/20 0756  AST 33  --   --   ALT 24  --   --   ALKPHOS 58  --   --   BILITOT 0.6  --   --   PROT 6.0*  --   --   ALBUMIN 2.2* 2.0* 2.0*   No results for input(s): LIPASE, AMYLASE in the last 168 hours. No results for input(s): AMMONIA in the last 168 hours.  CBC: Recent Labs  Lab 06/29/20 2137 07/02/20 1221 07/04/20 0756  WBC 12.6* 15.0* 16.3*  HGB 8.7* 8.8* 8.6*  HCT 28.2* 28.6* 27.8*  MCV 70.9* 71.7* 72.4*  PLT 134* 167 183    Cardiac Enzymes: No results  for input(s): CKTOTAL, CKMB, CKMBINDEX, TROPONINI in the last 168 hours.  BNP (last 3 results) No results for input(s): BNP in the last 8760 hours.  ProBNP (last 3 results) No results for input(s): PROBNP in the last 8760 hours.  Radiological Exams: No results found.  Assessment/Plan Active Problems:   Acute on chronic respiratory failure with hypoxia (HCC)   End stage renal disease on dialysis (HCC)   Lobar pneumonia, unspecified organism (HCC)   Acute metabolic encephalopathy   Chronic diastolic CHF (congestive heart failure) (HCC)   Acute on chronic respiratory failure with hypoxia patient currently is on pressure support has been on 28% FiO2 End-stage renal failure on dialysis being seen by nephrology Metabolic encephalopathy no change no improvement clinically Chronic diastolic heart failure supportive care fluid being managed by nephrology   I have personally seen and evaluated the patient, evaluated laboratory and imaging results, formulated the assessment and plan and placed orders. The Patient requires high complexity decision making with multiple systems involvement.  Rounds were done with the Respiratory Therapy Director and Staff therapists and discussed with nursing staff also.  Allyne Gee, MD [facility] Pulmonary Critical Care Medicine Sleep Medicine

## 2020-07-07 DIAGNOSIS — G9341 Metabolic encephalopathy: Secondary | ICD-10-CM | POA: Diagnosis not present

## 2020-07-07 DIAGNOSIS — I5032 Chronic diastolic (congestive) heart failure: Secondary | ICD-10-CM | POA: Diagnosis not present

## 2020-07-07 DIAGNOSIS — N186 End stage renal disease: Secondary | ICD-10-CM | POA: Diagnosis not present

## 2020-07-07 DIAGNOSIS — J9621 Acute and chronic respiratory failure with hypoxia: Secondary | ICD-10-CM | POA: Diagnosis not present

## 2020-07-07 LAB — RENAL FUNCTION PANEL
Albumin: 1.9 g/dL — ABNORMAL LOW (ref 3.5–5.0)
Anion gap: 12 (ref 5–15)
BUN: 98 mg/dL — ABNORMAL HIGH (ref 6–20)
CO2: 27 mmol/L (ref 22–32)
Calcium: 8.9 mg/dL (ref 8.9–10.3)
Chloride: 91 mmol/L — ABNORMAL LOW (ref 98–111)
Creatinine, Ser: 6.73 mg/dL — ABNORMAL HIGH (ref 0.61–1.24)
GFR, Estimated: 10 mL/min — ABNORMAL LOW (ref >60)
Glucose, Bld: 59 mg/dL — ABNORMAL LOW (ref 70–99)
Phosphorus: 3.2 mg/dL (ref 2.5–4.6)
Potassium: 4.8 mmol/L (ref 3.5–5.1)
Sodium: 130 mmol/L — ABNORMAL LOW (ref 135–145)

## 2020-07-07 LAB — CBC
HCT: 25.2 % — ABNORMAL LOW (ref 39.0–52.0)
Hemoglobin: 7.9 g/dL — ABNORMAL LOW (ref 13.0–17.0)
MCH: 22.6 pg — ABNORMAL LOW (ref 26.0–34.0)
MCHC: 31.3 g/dL (ref 30.0–36.0)
MCV: 72 fL — ABNORMAL LOW (ref 80.0–100.0)
Platelets: 265 10*3/uL (ref 150–400)
RBC: 3.5 MIL/uL — ABNORMAL LOW (ref 4.22–5.81)
RDW: 16.2 % — ABNORMAL HIGH (ref 11.5–15.5)
WBC: 15 10*3/uL — ABNORMAL HIGH (ref 4.0–10.5)
nRBC: 0 % (ref 0.0–0.2)

## 2020-07-07 NOTE — Progress Notes (Signed)
Pulmonary [place]   PULMONARY CRITICAL CARE SERVICE  PROGRESS NOTE     Jeffery Andrews  [id number]  DOB: 23-Nov-1975   DOA: 06/29/2020  Referring Physician: [name], MD  HPI: Jeffery Andrews is a 45 y.o. male being followed for ventilator/airway/oxygen weaning Acute on Chronic Respiratory Failure.  Patient is on pressure support has been on 28% FiO2  Medications: Reviewed on Rounds  Physical Exam:  Vitals: Temperature is 100.6 pulse 82 respiratory rate is 20 blood pressure is 160/80 saturations 100  Ventilator Settings on pressure support FiO2 is 28% pressure 10/5 tidal volume 583  General: Comfortable at this time Neck: supple Cardiovascular: no malignant arrhythmias Respiratory: No rhonchi very coarse breath sounds Skin: no rash seen on limited exam Musculoskeletal: No gross abnormality Psychiatric:unable to assess Neurologic:no involuntary movements         Lab Data:   Basic Metabolic Panel: Recent Labs  Lab 07/02/20 1221 07/04/20 0756 07/07/20 0737  NA 129* 131* 130*  K 5.1 4.5 4.8  CL 90* 93* 91*  CO2 '24 27 27  '$ GLUCOSE 77 98 59*  BUN 92* 83* 98*  CREATININE 7.34* 6.14* 6.73*  CALCIUM 8.4* 8.6* 8.9  PHOS 4.9* 4.2 3.2    ABG: No results for input(s): PHART, PCO2ART, PO2ART, HCO3, O2SAT in the last 168 hours.  Liver Function Tests: Recent Labs  Lab 07/02/20 1221 07/04/20 0756 07/07/20 0737  ALBUMIN 2.0* 2.0* 1.9*   No results for input(s): LIPASE, AMYLASE in the last 168 hours. No results for input(s): AMMONIA in the last 168 hours.  CBC: Recent Labs  Lab 07/02/20 1221 07/04/20 0756 07/07/20 0737  WBC 15.0* 16.3* 15.0*  HGB 8.8* 8.6* 7.9*  HCT 28.6* 27.8* 25.2*  MCV 71.7* 72.4* 72.0*  PLT 167 183 265    Cardiac Enzymes: No results for input(s): CKTOTAL, CKMB, CKMBINDEX, TROPONINI in the last 168 hours.  BNP (last 3 results) No results for input(s): BNP in the last 8760  hours.  ProBNP (last 3 results) No results for input(s): PROBNP in the last 8760 hours.  Radiological Exams: No results found.  Assessment/Plan Active Problems:   Acute on chronic respiratory failure with hypoxia (HCC)   End stage renal disease on dialysis (HCC)   Lobar pneumonia, unspecified organism (HCC)   Acute metabolic encephalopathy   Chronic diastolic CHF (congestive heart failure) (HCC)   Acute on chronic respiratory failure hypoxia continue with the pressure support at this time.  Patient remains neurologically unresponsive.  Spoke with primary care team overall prognosis is quite poor and based on his neurological lack of improvement need to have a conversation with the family regarding whether or not to do a tracheostomy and possibly consider comfort measures End-stage renal disease on hemodialysis we will continue to monitor closely. Metabolic encephalopathy overall no change no improvement from a neurological perspective Chronic diastolic heart failure compensated we will continue to monitor closely   I have personally seen and evaluated the patient, evaluated laboratory and imaging results, formulated the assessment and plan and placed orders. The Patient requires high complexity decision making with multiple systems involvement.  Rounds were done with the Respiratory Therapy Director and Staff therapists and discussed with nursing staff also.  Allyne Gee, MD [facility] Pulmonary Critical Care Medicine Sleep Medicine

## 2020-07-08 ENCOUNTER — Other Ambulatory Visit (HOSPITAL_COMMUNITY): Payer: Medicare Other

## 2020-07-08 ENCOUNTER — Inpatient Hospital Stay (HOSPITAL_COMMUNITY)
Admission: RE | Admit: 2020-07-08 | Discharge: 2020-07-08 | Disposition: A | Discharge status: Home / Self Care | Payer: Medicare Other | Source: Other Acute Inpatient Hospital | Attending: Internal Medicine | Admitting: Internal Medicine

## 2020-07-08 DIAGNOSIS — R569 Unspecified convulsions: Secondary | ICD-10-CM | POA: Diagnosis not present

## 2020-07-08 DIAGNOSIS — N186 End stage renal disease: Secondary | ICD-10-CM | POA: Diagnosis not present

## 2020-07-08 DIAGNOSIS — G9341 Metabolic encephalopathy: Secondary | ICD-10-CM | POA: Diagnosis not present

## 2020-07-08 DIAGNOSIS — J9621 Acute and chronic respiratory failure with hypoxia: Secondary | ICD-10-CM | POA: Diagnosis not present

## 2020-07-08 DIAGNOSIS — I5032 Chronic diastolic (congestive) heart failure: Secondary | ICD-10-CM | POA: Diagnosis not present

## 2020-07-08 NOTE — Procedures (Signed)
Patient Name: KENNYTH PEARSON  MRN: [id number]  Epilepsy Attending: Lora Havens  Referring Physician/Provider: Dr [name] Date: 07/08/2020 Duration: 23.34 mins  Patient history: 45 year old male with altered mental status.  EEG to evaluate for seizures.  Level of alertness: lethargic  AEDs during EEG study: None  Technical aspects: This EEG study was done with scalp electrodes positioned according to the 10-20 International system of electrode placement. Electrical activity was acquired at a sampling rate of '500Hz'$  and reviewed with a high frequency filter of '70Hz'$  and a low frequency filter of '1Hz'$ . EEG data were recorded continuously and digitally stored.   Description: EEG showed continuous generalized 5 to 6 Hz theta as well as intermittent generalized 2 to 3 Hz delta slowing. Hyperventilation and photic stimulation were not performed.     ABNORMALITY - Continuous slow, generalized  IMPRESSION: This study is suggestive of moderate to severe diffuse encephalopathy, nonspecific etiology. No seizures or epileptiform discharges were seen throughout the recording.  Zakary Kimura Barbra Sarks

## 2020-07-08 NOTE — Progress Notes (Signed)
EEG completed, results pending. 

## 2020-07-08 NOTE — Progress Notes (Signed)
Pulmonary [place]   PULMONARY CRITICAL CARE SERVICE  PROGRESS NOTE     [name]  [id number]  DOB: 21-Jan-1975   DOA: 06/29/2020  Referring Physician: [name], MD  HPI: Jeffery Andrews is a 45 y.o. male being followed for ventilator/airway/oxygen weaning Acute on Chronic Respiratory Failure.  Patient was on pressure support this morning looks good was on a pressure of 10/5.  Spoke with the case management and I discussed the possibility of an extubation trial.  The patient is a dialysis patient if he does get a tracheostomy it would mean there would be undue hardship on the family having to have the patient placed outside of [place].  [place] there [place]  Since he has done well with the pressure support and he does not have significant secretions it may be worthwhile to try extubation.  If he fails extubation then definitively will need to be reintubated and have a tracheostomy placed.  Medications: Reviewed on Rounds  Physical Exam:  Vitals: Temperature is 98.6 pulse 85 respiratory 16 blood pressure is 146/65 saturations 100%  Ventilator Settings currently on pressure support 10/5 requiring 28% FiO2 with tidal volume 598 cc  General: Comfortable at this time Neck: supple Cardiovascular: no malignant arrhythmias Respiratory: No rhonchi very coarse breath Skin: no rash seen on limited exam Musculoskeletal: No gross abnormality Psychiatric:unable to assess Neurologic:no involuntary movements         Lab Data:   Basic Metabolic Panel: Recent Labs  Lab 07/02/20 1221 07/04/20 0756 07/07/20 0737  NA 129* 131* 130*  K 5.1 4.5 4.8  CL 90* 93* 91*  CO2 '24 27 27  '$ GLUCOSE 77 98 59*  BUN 92* 83* 98*  CREATININE 7.34* 6.14* 6.73*  CALCIUM 8.4* 8.6* 8.9  PHOS 4.9* 4.2 3.2    ABG: No results for input(s): PHART, PCO2ART, PO2ART, HCO3, O2SAT in the last  168 hours.  Liver Function Tests: Recent Labs  Lab 07/02/20 1221 07/04/20 0756 07/07/20 0737  ALBUMIN 2.0* 2.0* 1.9*   No results for input(s): LIPASE, AMYLASE in the last 168 hours. No results for input(s): AMMONIA in the last 168 hours.  CBC: Recent Labs  Lab 07/02/20 1221 07/04/20 0756 07/07/20 0737  WBC 15.0* 16.3* 15.0*  HGB 8.8* 8.6* 7.9*  HCT 28.6* 27.8* 25.2*  MCV 71.7* 72.4* 72.0*  PLT 167 183 265    Cardiac Enzymes: No results for input(s): CKTOTAL, CKMB, CKMBINDEX, TROPONINI in the last 168 hours.  BNP (last 3 results) No results for input(s): BNP in the last 8760 hours.  ProBNP (last 3 results) No results for input(s): PROBNP in the last 8760 hours.  Radiological Exams: No results found.  Assessment/Plan Active Problems:   Acute on chronic respiratory failure with hypoxia (HCC)   End stage renal disease on dialysis (HCC)   Lobar pneumonia, unspecified organism (HCC)   Acute metabolic encephalopathy   Chronic diastolic CHF (congestive heart failure) (HCC)   Acute on chronic respiratory failure hypoxia patient is doing fine with the pressure support we will have respiratory therapy decrease his pressure support 5/5 for 2 hours check an ABG at that point and then make a decision regarding possibility of extubation. End-stage renal disease on hemodialysis Lobar pneumonia treated with antibiotics we will continue to follow along closely Metabolic encephalopathy no changes noted at this time he remains nonresponsive CHF diastolic heart failure continue with blood pressure control supportive care  I have personally seen and evaluated the patient, evaluated laboratory and imaging results, formulated the assessment and plan and placed orders. The Patient requires high complexity decision making with multiple systems involvement.  Rounds were done with the Respiratory Therapy Director and Staff therapists and discussed with nursing staff also.  Allyne Gee, MD [facility] Pulmonary Critical Care Medicine Sleep Medicine

## 2020-07-08 NOTE — Progress Notes (Signed)
[facility]  ROUNDING NOTE   Subjective:  Patient maintained on dialysis on TTS schedule. Blood pressure control has improved a bit with the addition of clonidine patch. However continues to have some periods of elevated blood pressure. EEG was performed which showed moderate to severe diffuse encephalopathy.  Objective:  Vital signs in last 24 hours:  Temperature nine 9.6 pulse 85 respiration 16 blood pressure 146/65   Physical Exam: General:  Critically ill-appearing  Head:  Normocephalic, atraumatic.  Endotracheal tube in place  Eyes:  Anicteric  Neck:  Supple  Lungs:   Clear to auscultation, vent assisted  Heart:  S1S2 no rubs  Abdomen:   Soft, nontender, bowel sounds present  Extremities:  Trace peripheral edema.  Neurologic:  Intubated, not following commands  Skin:  No acute skin rash  Access:  Left upper extremity AV fistula    Basic Metabolic Panel: Recent Labs  Lab 07/02/20 1221 07/04/20 0756 07/07/20 0737  NA 129* 131* 130*  K 5.1 4.5 4.8  CL 90* 93* 91*  CO2 '24 27 27  '$ GLUCOSE 77 98 59*  BUN 92* 83* 98*  CREATININE 7.34* 6.14* 6.73*  CALCIUM 8.4* 8.6* 8.9  PHOS 4.9* 4.2 3.2     Liver Function Tests: Recent Labs  Lab 07/02/20 1221 07/04/20 0756 07/07/20 0737  ALBUMIN 2.0* 2.0* 1.9*    No results for input(s): LIPASE, AMYLASE in the last 168 hours. No results for input(s): AMMONIA in the last 168 hours.  CBC: Recent Labs  Lab 07/02/20 1221 07/04/20 0756 07/07/20 0737  WBC 15.0* 16.3* 15.0*  HGB 8.8* 8.6* 7.9*  HCT 28.6* 27.8* 25.2*  MCV 71.7* 72.4* 72.0*  PLT 167 183 265     Cardiac Enzymes: No results for input(s): CKTOTAL, CKMB, CKMBINDEX, TROPONINI in the last 168 hours.  BNP: Invalid input(s): POCBNP  CBG: No results for input(s): GLUCAP in the last 168 hours.  Microbiology: Results for orders placed or performed during the hospital encounter of 06/29/20  Culture, Respiratory w Gram Stain     Status: None    Collection Time: 06/30/20  4:00 PM   Specimen: Tracheal Aspirate; Respiratory  Result Value Ref Range Status   Specimen Description TRACHEAL ASPIRATE  Final   Special Requests NONE  Final   Gram Stain   Final    FEW WBC PRESENT,BOTH PMN AND MONONUCLEAR MODERATE GRAM NEGATIVE COCCOBACILLI RARE GRAM POSITIVE COCCI IN PAIRS FEW GRAM POSITIVE RODS Performed at [facility], [place] [address] [place], [place] [address]    Culture   Final    ABUNDANT HAEMOPHILUS INFLUENZAE BETA LACTAMASE NEGATIVE FEW STAPHYLOCOCCUS AUREUS    Report Status 07/04/2020 FINAL  Final   Organism ID, Bacteria STAPHYLOCOCCUS AUREUS  Final      Susceptibility   Staphylococcus aureus - MIC*    CIPROFLOXACIN <=0.5 SENSITIVE Sensitive     ERYTHROMYCIN >=8 RESISTANT Resistant     GENTAMICIN <=0.5 SENSITIVE Sensitive     OXACILLIN <=0.25 SENSITIVE Sensitive     TETRACYCLINE <=1 SENSITIVE Sensitive     VANCOMYCIN <=0.5 SENSITIVE Sensitive     TRIMETH/SULFA <=10 SENSITIVE Sensitive     CLINDAMYCIN <=0.25 SENSITIVE Sensitive     RIFAMPIN <=0.5 SENSITIVE Sensitive     Inducible Clindamycin NEGATIVE Sensitive     * FEW STAPHYLOCOCCUS AUREUS    Coagulation Studies: No results for input(s): LABPROT, INR in the last 72 hours.  Urinalysis: No results for input(s): COLORURINE, LABSPEC, [place], [place], [place], [place], [place], [place], [place],  NITRITE, LEUKOCYTESUR in the last 72 hours.  Invalid input(s): APPERANCEUR    Imaging: EEG adult  Result Date: 07/08/2020 Lora Havens, MD     07/08/2020 11:36 AM Patient Name: Jeffery Andrews MRN: [id number] Epilepsy Attending: Lora Havens Referring Physician/Provider: Dr [name] Date: 07/08/2020 Duration: 23.34 mins Patient history: 45 year old male with altered mental status.  EEG to evaluate for seizures. Level of alertness: lethargic AEDs during EEG study: None Technical aspects: This EEG study was done with scalp electrodes positioned  according to the 10-20 International system of electrode placement. Electrical activity was acquired at a sampling rate of '500Hz'$  and reviewed with a high frequency filter of '70Hz'$  and a low frequency filter of '1Hz'$ . EEG data were recorded continuously and digitally stored. Description: EEG showed continuous generalized 5 to 6 Hz theta as well as intermittent generalized 2 to 3 Hz delta slowing. Hyperventilation and photic stimulation were not performed.   ABNORMALITY - Continuous slow, generalized IMPRESSION: This study is suggestive of moderate to severe diffuse encephalopathy, nonspecific etiology. No seizures or epileptiform discharges were seen throughout the recording. Lora Havens     Medications:       Assessment/ Plan:  45 y.o. male with a PMHx of ESRD on HD, posterior reversible encephalopathy syndrome, diabetes mellitus type 2, chronic systolic heart failure, anemia of chronic kidney disease, secondary hyperparathyroidism, malignant hypertension, acute respiratory failure, who was admitted to [facility] on 06/29/2020 for ongoing treatment of acute respiratory failure, posterior reversible encephalopathy syndrome, and end-stage renal disease.   1.  ESRD on HD.  We will plan for dialysis treatment again tomorrow.  2.  Anemia of chronic kidney disease.  Hemoglobin down to 7.9.  Continue Retacrit 4000 units IV with dialysis treatments.  3.  Secondary hyperparathyroidism.  Phosphorus 3.2 and at target.  4.  Acute respiratory failure.  Suspect that encephalopathy playing some role in the inability to perform extubation on this patient.  Defer further management to pulmonary/critical care.  5.  Hypertension.  Blood pressure control overall has improved with the addition of clonidine patch however he does still have some episodes of severe hypertension.  I have advised nursing to check blood pressure manually when these episodes occur.  Otherwise continue amlodipine, clonidine and hold  p.o. and transdermal, hydralazine, labetalol, and losartan.  EEG result reviewed and was suggestive of moderate to severe diffuse encephalopathy.  Nonspecific etiology.  Primary team considering CT head again.   LOS: 0 Eulises Kijowski 7/6/202212:28 PM

## 2020-07-09 DIAGNOSIS — J9621 Acute and chronic respiratory failure with hypoxia: Secondary | ICD-10-CM | POA: Diagnosis not present

## 2020-07-09 DIAGNOSIS — N186 End stage renal disease: Secondary | ICD-10-CM | POA: Diagnosis not present

## 2020-07-09 DIAGNOSIS — I5032 Chronic diastolic (congestive) heart failure: Secondary | ICD-10-CM | POA: Diagnosis not present

## 2020-07-09 DIAGNOSIS — G9341 Metabolic encephalopathy: Secondary | ICD-10-CM | POA: Diagnosis not present

## 2020-07-09 LAB — RENAL FUNCTION PANEL
Albumin: 2 g/dL — ABNORMAL LOW (ref 3.5–5.0)
Anion gap: 12 (ref 5–15)
BUN: 84 mg/dL — ABNORMAL HIGH (ref 6–20)
CO2: 26 mmol/L (ref 22–32)
Calcium: 8.7 mg/dL — ABNORMAL LOW (ref 8.9–10.3)
Chloride: 93 mmol/L — ABNORMAL LOW (ref 98–111)
Creatinine, Ser: 6.36 mg/dL — ABNORMAL HIGH (ref 0.61–1.24)
GFR, Estimated: 10 mL/min — ABNORMAL LOW (ref >60)
Glucose, Bld: 206 mg/dL — ABNORMAL HIGH (ref 70–99)
Phosphorus: 3 mg/dL (ref 2.5–4.6)
Potassium: 4.3 mmol/L (ref 3.5–5.1)
Sodium: 131 mmol/L — ABNORMAL LOW (ref 135–145)

## 2020-07-09 LAB — CBC
HCT: 25.6 % — ABNORMAL LOW (ref 39.0–52.0)
Hemoglobin: 7.7 g/dL — ABNORMAL LOW (ref 13.0–17.0)
MCH: 22.1 pg — ABNORMAL LOW (ref 26.0–34.0)
MCHC: 30.1 g/dL (ref 30.0–36.0)
MCV: 73.6 fL — ABNORMAL LOW (ref 80.0–100.0)
Platelets: 295 10*3/uL (ref 150–400)
RBC: 3.48 MIL/uL — ABNORMAL LOW (ref 4.22–5.81)
RDW: 16.1 % — ABNORMAL HIGH (ref 11.5–15.5)
WBC: 16.6 10*3/uL — ABNORMAL HIGH (ref 4.0–10.5)
nRBC: 0 % (ref 0.0–0.2)

## 2020-07-09 NOTE — Progress Notes (Signed)
Pulmonary Jeffery Andrews   PULMONARY CRITICAL CARE SERVICE  PROGRESS NOTE     [name]  [id number]  DOB: [birth year]   DOA: 06/29/2020  Referring Physician: [name], MD  HPI: Jeffery Andrews is a 45 y.o. male being followed for ventilator/airway/oxygen weaning Acute on Chronic Respiratory Failure.  Patient at this time is on pressure support FiO2 is 28% pressure 10/5  Medications: Reviewed on Rounds  Physical Exam:  Vitals: Temperature is 99.2 pulse 74 respiratory 12 blood pressure is 185/84 saturations 100%  Ventilator Settings on pressure support FiO2 is 28% pressure 10.5  General: Comfortable at this time Neck: supple Cardiovascular: no malignant arrhythmias Respiratory: Scattered rhonchi expansion is equal Skin: no rash seen on limited exam Musculoskeletal: No gross abnormality Psychiatric:unable to assess Neurologic:no involuntary movements         Lab Data:   Basic Metabolic Panel: Recent Labs  Lab 07/02/20 1221 07/04/20 0756 07/07/20 0737 07/09/20 0800  NA 129* 131* 130* 131*  K 5.1 4.5 4.8 4.3  CL 90* 93* 91* 93*  CO2 '24 27 27 26  '$ GLUCOSE 77 98 59* 206*  BUN 92* 83* 98* 84*  CREATININE 7.34* 6.14* 6.73* 6.36*  CALCIUM 8.4* 8.6* 8.9 8.7*  PHOS 4.9* 4.2 3.2 3.0    ABG: No results for input(s): PHART, PCO2ART, PO2ART, HCO3, O2SAT in the last 168 hours.  Liver Function Tests: Recent Labs  Lab 07/02/20 1221 07/04/20 0756 07/07/20 0737 07/09/20 0800  ALBUMIN 2.0* 2.0* 1.9* 2.0*   No results for input(s): LIPASE, AMYLASE in the last 168 hours. No results for input(s): AMMONIA in the last 168 hours.  CBC: Recent Labs  Lab 07/02/20 1221 07/04/20 0756 07/07/20 0737 07/09/20 0800  WBC 15.0* 16.3* 15.0* 16.6*  HGB 8.8* 8.6* 7.9* 7.7*  HCT 28.6* 27.8* 25.2* 25.6*  MCV 71.7* 72.4* 72.0* 73.6*  PLT 167 183 265 295    Cardiac Enzymes: No results for input(s): CKTOTAL, CKMB,  CKMBINDEX, TROPONINI in the last 168 hours.  BNP (last 3 results) No results for input(s): BNP in the last 8760 hours.  ProBNP (last 3 results) No results for input(s): PROBNP in the last 8760 hours.  Radiological Exams: DG Chest Port 1 View  Result Date: 07/08/2020 CLINICAL DATA:  PICC line repositioning. EXAM: PORTABLE CHEST 1 VIEW COMPARISON:  Earlier the same date and 06/29/2020. FINDINGS: 1828 hours. Right arm PICC has been redirected with the tip overlying the superior cavoatrial junction. The endotracheal and enteric tubes are unchanged in position. The lungs appear clear. There is no pleural effusion or pneumothorax. The heart size and mediastinal contours are unchanged. IMPRESSION: PICC line has been redirected with the tip projecting over the superior cavoatrial junction. Electronically Signed   By: Richardean Sale M.D.   On: 07/08/2020 18:50   DG Chest Port 1 View  Result Date: 07/08/2020 CLINICAL DATA:  PICC line placement. EXAM: PORTABLE CHEST 1 VIEW COMPARISON:  06/29/2020. FINDINGS: 1821 hours. Right IJ PICC appears looped in the left brachiocephalic vein with the tip projected laterally. Endotracheal tube tip projects over the mid trachea. Enteric tube projects below the diaphragm. The heart size and mediastinal contours are stable. There is no pleural effusion or pneumothorax. Skin folds overlie the left chest. These are less conspicuous on repeat examination. IMPRESSION: Malpositioned right IJ PICC line as described. Repeat examination has already been performed; see separate report. Electronically Signed   By: Richardean Sale M.D.   On: 07/08/2020 18:49  EEG adult  Result Date: 07/08/2020 Lora Havens, MD     07/08/2020 11:36 AM Patient Name: Jeffery Andrews MRN: [id number] Epilepsy Attending: Lora Havens Referring Physician/Provider: Dr [name] Date: 07/08/2020 Duration: 23.34 mins Patient history: 44 year old male with altered mental status.  EEG to evaluate for  seizures. Level of alertness: lethargic AEDs during EEG study: None Technical aspects: This EEG study was done with scalp electrodes positioned according to the 10-20 International system of electrode placement. Electrical activity was acquired at a sampling rate of '500Hz'$  and reviewed with a high frequency filter of '70Hz'$  and a low frequency filter of '1Hz'$ . EEG data were recorded continuously and digitally stored. Description: EEG showed continuous generalized 5 to 6 Hz theta as well as intermittent generalized 2 to 3 Hz delta slowing. Hyperventilation and photic stimulation were not performed.   ABNORMALITY - Continuous slow, generalized IMPRESSION: This study is suggestive of moderate to severe diffuse encephalopathy, nonspecific etiology. No seizures or epileptiform discharges were seen throughout the recording. [name]    Assessment/Plan Active Problems:   Acute on chronic respiratory failure with hypoxia (HCC)   End stage renal disease on dialysis (HCC)   Lobar pneumonia, unspecified organism (HCC)   Acute metabolic encephalopathy   Chronic diastolic CHF (congestive heart failure) (HCC)   Acute on chronic respiratory failure with hypoxia we will continue with full supportive care.  Monitor.  We did try to wean him and unfortunately was having periods of apnea.  Family wants all aggressive measures to be done and therefore we will need to consider doing a tracheostomy at this stage End-stage renal failure patient is on hemodialysis supportive care Acute metabolic encephalopathy the EEG was done and it shows moderate to severe diffuse encephalopathy there was no active seizure discharges noted prognosis is poor Chronic diastolic heart failure now compensated monitor patient's fluid status Lobar pneumonia has been treated with antibiotics we will continue to monitor.   I have personally seen and evaluated the patient, evaluated laboratory and imaging results, formulated the assessment and  plan and placed orders. The Patient requires high complexity decision making with multiple systems involvement.  Rounds were done with the Respiratory Therapy Director and Staff therapists and discussed with nursing staff also.  Allyne Gee, MD [facility] Pulmonary Critical Care Medicine Sleep Medicine

## 2020-07-10 DIAGNOSIS — N186 End stage renal disease: Secondary | ICD-10-CM | POA: Diagnosis not present

## 2020-07-10 DIAGNOSIS — J9621 Acute and chronic respiratory failure with hypoxia: Secondary | ICD-10-CM | POA: Diagnosis not present

## 2020-07-10 DIAGNOSIS — I5032 Chronic diastolic (congestive) heart failure: Secondary | ICD-10-CM | POA: Diagnosis not present

## 2020-07-10 DIAGNOSIS — G9341 Metabolic encephalopathy: Secondary | ICD-10-CM | POA: Diagnosis not present

## 2020-07-10 LAB — BASIC METABOLIC PANEL
Anion gap: 10 (ref 5–15)
BUN: 58 mg/dL — ABNORMAL HIGH (ref 6–20)
CO2: 28 mmol/L (ref 22–32)
Calcium: 8.8 mg/dL — ABNORMAL LOW (ref 8.9–10.3)
Chloride: 94 mmol/L — ABNORMAL LOW (ref 98–111)
Creatinine, Ser: 4.78 mg/dL — ABNORMAL HIGH (ref 0.61–1.24)
GFR, Estimated: 14 mL/min — ABNORMAL LOW (ref >60)
Glucose, Bld: 98 mg/dL (ref 70–99)
Potassium: 4.3 mmol/L (ref 3.5–5.1)
Sodium: 132 mmol/L — ABNORMAL LOW (ref 135–145)

## 2020-07-10 LAB — CBC
HCT: 24.6 % — ABNORMAL LOW (ref 39.0–52.0)
Hemoglobin: 7.5 g/dL — ABNORMAL LOW (ref 13.0–17.0)
MCH: 22.4 pg — ABNORMAL LOW (ref 26.0–34.0)
MCHC: 30.5 g/dL (ref 30.0–36.0)
MCV: 73.4 fL — ABNORMAL LOW (ref 80.0–100.0)
Platelets: 300 10*3/uL (ref 150–400)
RBC: 3.35 MIL/uL — ABNORMAL LOW (ref 4.22–5.81)
RDW: 16.2 % — ABNORMAL HIGH (ref 11.5–15.5)
WBC: 18 10*3/uL — ABNORMAL HIGH (ref 4.0–10.5)
nRBC: 0 % (ref 0.0–0.2)

## 2020-07-10 LAB — MAGNESIUM: Magnesium: 2.3 mg/dL (ref 1.7–2.4)

## 2020-07-10 LAB — VANCOMYCIN, RANDOM: Vancomycin Rm: 9

## 2020-07-10 LAB — CULTURE, BLOOD (ROUTINE X 2)

## 2020-07-10 NOTE — Progress Notes (Signed)
[facility]  ROUNDING NOTE   Subjective:  Positive blood culture noted with gram-positive cocci in clusters. Patient does have a right IJ PICC line in place. Also has a left upper extremity AV fistula. Due for dialysis treatment tomorrow.  Objective:  Vital signs in last 24 hours:  Temperature 99.6 pulse 71 respirations 12 blood pressure 150/82   Physical Exam: General:  Critically ill-appearing  Head:  Normocephalic, atraumatic.  Endotracheal tube in place  Eyes:  Anicteric  Neck:  Supple  Lungs:   Clear to auscultation, vent assisted  Heart:  S1S2 no rubs  Abdomen:   Soft, nontender, bowel sounds present  Extremities:  Trace peripheral edema.  Neurologic:  Intubated, not following commands  Skin:  No acute skin rash  Access:  Left upper extremity AV fistula    Basic Metabolic Panel: Recent Labs  Lab 07/04/20 0756 07/07/20 0737 07/09/20 0800 07/10/20 0659  NA 131* 130* 131* 132*  K 4.5 4.8 4.3 4.3  CL 93* 91* 93* 94*  CO2 '27 27 26 28  '$ GLUCOSE 98 59* 206* 98  BUN 83* 98* 84* 58*  CREATININE 6.14* 6.73* 6.36* 4.78*  CALCIUM 8.6* 8.9 8.7* 8.8*  MG  --   --   --  2.3  PHOS 4.2 3.2 3.0  --      Liver Function Tests: Recent Labs  Lab 07/04/20 0756 07/07/20 0737 07/09/20 0800  ALBUMIN 2.0* 1.9* 2.0*    No results for input(s): LIPASE, AMYLASE in the last 168 hours. No results for input(s): AMMONIA in the last 168 hours.  CBC: Recent Labs  Lab 07/04/20 0756 07/07/20 0737 07/09/20 0800 07/10/20 0659  WBC 16.3* 15.0* 16.6* 18.0*  HGB 8.6* 7.9* 7.7* 7.5*  HCT 27.8* 25.2* 25.6* 24.6*  MCV 72.4* 72.0* 73.6* 73.4*  PLT 183 265 295 300     Cardiac Enzymes: No results for input(s): CKTOTAL, CKMB, CKMBINDEX, TROPONINI in the last 168 hours.  BNP: Invalid input(s): POCBNP  CBG: No results for input(s): GLUCAP in the last 168 hours.  Microbiology: Results for orders placed or performed during the hospital encounter of 06/29/20   Culture, Respiratory w Gram Stain     Status: None   Collection Time: 06/30/20  4:00 PM   Specimen: Tracheal Aspirate; Respiratory  Result Value Ref Range Status   Specimen Description TRACHEAL ASPIRATE  Final   Special Requests NONE  Final   Gram Stain   Final    FEW WBC PRESENT,BOTH PMN AND MONONUCLEAR MODERATE GRAM NEGATIVE COCCOBACILLI RARE GRAM POSITIVE COCCI IN PAIRS FEW GRAM POSITIVE RODS Performed at [facility], [place] [address] [place], [place] [address]    Culture   Final    ABUNDANT HAEMOPHILUS INFLUENZAE BETA LACTAMASE NEGATIVE FEW STAPHYLOCOCCUS AUREUS    Report Status 07/04/2020 FINAL  Final   Organism ID, Bacteria STAPHYLOCOCCUS AUREUS  Final      Susceptibility   Staphylococcus aureus - MIC*    CIPROFLOXACIN <=0.5 SENSITIVE Sensitive     ERYTHROMYCIN >=8 RESISTANT Resistant     GENTAMICIN <=0.5 SENSITIVE Sensitive     OXACILLIN <=0.25 SENSITIVE Sensitive     TETRACYCLINE <=1 SENSITIVE Sensitive     VANCOMYCIN <=0.5 SENSITIVE Sensitive     TRIMETH/SULFA <=10 SENSITIVE Sensitive     CLINDAMYCIN <=0.25 SENSITIVE Sensitive     RIFAMPIN <=0.5 SENSITIVE Sensitive     Inducible Clindamycin NEGATIVE Sensitive     * FEW STAPHYLOCOCCUS AUREUS  Culture, blood (routine x 2)  Status: None (Preliminary result)   Collection Time: 07/08/20  7:12 PM   Specimen: BLOOD  Result Value Ref Range Status   Specimen Description BLOOD SITE NOT SPECIFIED  Final   Special Requests   Final    BOTTLES DRAWN AEROBIC AND ANAEROBIC Blood Culture adequate volume   Culture   Final    NO GROWTH 2 DAYS Performed at [facility], [address], [place], [place] [address]    Report Status PENDING  Incomplete  Culture, blood (routine x 2)     Status: None (Preliminary result)   Collection Time: 07/08/20  7:12 PM   Specimen: BLOOD  Result Value Ref Range Status   Specimen Description BLOOD SITE NOT SPECIFIED  Final   Special Requests   Final    BOTTLES DRAWN AEROBIC AND  ANAEROBIC Blood Culture results may not be optimal due to an inadequate volume of blood received in culture bottles   Culture  Setup Time   Final    GRAM POSITIVE COCCI IN CLUSTERS AEROBIC BOTTLE ONLY Organism ID to follow CRITICAL RESULT CALLED TO, READ BACK BY AND VERIFIED WITH: Mirian Mo RN, [id number] 07/10/20 D.VANHOOK Performed at [facility], [place] [address], [place], [place] [address]    Culture GRAM POSITIVE COCCI  Final   Report Status PENDING  Incomplete  Blood Culture ID Panel (Reflexed)     Status: Abnormal   Collection Time: 07/08/20  7:12 PM  Result Value Ref Range Status   Enterococcus faecalis NOT DETECTED NOT DETECTED Final   Enterococcus Faecium NOT DETECTED NOT DETECTED Final   Listeria monocytogenes NOT DETECTED NOT DETECTED Final   Staphylococcus species DETECTED (A) NOT DETECTED Final   Staphylococcus aureus (BCID) NOT DETECTED NOT DETECTED Final   Staphylococcus epidermidis DETECTED (A) NOT DETECTED Final    Comment: Methicillin (oxacillin) resistant coagulase negative staphylococcus. Possible blood culture contaminant (unless isolated from more than one blood culture draw or clinical case suggests pathogenicity). No antibiotic treatment is indicated for blood  culture contaminants.    Staphylococcus lugdunensis NOT DETECTED NOT DETECTED Final   Streptococcus species NOT DETECTED NOT DETECTED Final   Streptococcus agalactiae NOT DETECTED NOT DETECTED Final   Streptococcus pneumoniae NOT DETECTED NOT DETECTED Final   Streptococcus pyogenes NOT DETECTED NOT DETECTED Final   A.calcoaceticus-baumannii NOT DETECTED NOT DETECTED Final   Bacteroides fragilis NOT DETECTED NOT DETECTED Final   Enterobacterales NOT DETECTED NOT DETECTED Final   Enterobacter cloacae complex NOT DETECTED NOT DETECTED Final   Escherichia coli NOT DETECTED NOT DETECTED Final   Klebsiella aerogenes NOT DETECTED NOT DETECTED Final   Klebsiella oxytoca NOT DETECTED NOT DETECTED Final    Klebsiella pneumoniae NOT DETECTED NOT DETECTED Final   Proteus species NOT DETECTED NOT DETECTED Final   Salmonella species NOT DETECTED NOT DETECTED Final   Serratia marcescens NOT DETECTED NOT DETECTED Final   Haemophilus influenzae NOT DETECTED NOT DETECTED Final   Neisseria meningitidis NOT DETECTED NOT DETECTED Final   Pseudomonas aeruginosa NOT DETECTED NOT DETECTED Final   Stenotrophomonas maltophilia NOT DETECTED NOT DETECTED Final   Candida albicans NOT DETECTED NOT DETECTED Final   Candida auris NOT DETECTED NOT DETECTED Final   Candida glabrata NOT DETECTED NOT DETECTED Final   Candida krusei NOT DETECTED NOT DETECTED Final   Candida parapsilosis NOT DETECTED NOT DETECTED Final   Candida tropicalis NOT DETECTED NOT DETECTED Final   Cryptococcus neoformans/gattii NOT DETECTED NOT DETECTED Final   Methicillin resistance mecA/C DETECTED (A)  NOT DETECTED Final    Comment: Performed at [facility], [place] [address] [place], [place] [address]    Coagulation Studies: No results for input(s): LABPROT, INR in the last 72 hours.  Urinalysis: No results for input(s): COLORURINE, LABSPEC, PHURINE, GLUCOSEU, HGBUR, BILIRUBINUR, KETONESUR, PROTEINUR, UROBILINOGEN, NITRITE, LEUKOCYTESUR in the last 72 hours.  Invalid input(s): APPERANCEUR    Imaging: DG Chest Port 1 View  Result Date: 07/08/2020 CLINICAL DATA:  PICC line repositioning. EXAM: PORTABLE CHEST 1 VIEW COMPARISON:  Earlier the same date and 06/29/2020. FINDINGS: 1828 hours. Right arm PICC has been redirected with the tip overlying the superior cavoatrial junction. The endotracheal and enteric tubes are unchanged in position. The lungs appear clear. There is no pleural effusion or pneumothorax. The heart size and mediastinal contours are unchanged. IMPRESSION: PICC line has been redirected with the tip projecting over the superior cavoatrial junction. Electronically Signed   By: Richardean Sale M.D.   On: 07/08/2020 18:50    DG Chest Port 1 View  Result Date: 07/08/2020 CLINICAL DATA:  PICC line placement. EXAM: PORTABLE CHEST 1 VIEW COMPARISON:  06/29/2020. FINDINGS: 1821 hours. Right IJ PICC appears looped in the left brachiocephalic vein with the tip projected laterally. Endotracheal tube tip projects over the mid trachea. Enteric tube projects below the diaphragm. The heart size and mediastinal contours are stable. There is no pleural effusion or pneumothorax. Skin folds overlie the left chest. These are less conspicuous on repeat examination. IMPRESSION: Malpositioned right IJ PICC line as described. Repeat examination has already been performed; see separate report. Electronically Signed   By: Richardean Sale M.D.   On: 07/08/2020 18:49   EEG adult  Result Date: 07/08/2020 Lora Havens, MD     07/08/2020 11:36 AM Patient Name: Jeffery Andrews MRN: [id number] Epilepsy Attending: Lora Havens Referring Physician/Provider: Dr [name] Date: 07/08/2020 Duration: 23.34 mins Patient history: 45 year old male with altered mental status.  EEG to evaluate for seizures. Level of alertness: lethargic AEDs during EEG study: None Technical aspects: This EEG study was done with scalp electrodes positioned according to the 10-20 International system of electrode placement. Electrical activity was acquired at a sampling rate of '500Hz'$  and reviewed with a high frequency filter of '70Hz'$  and a low frequency filter of '1Hz'$ . EEG data were recorded continuously and digitally stored. Description: EEG showed continuous generalized 5 to 6 Hz theta as well as intermittent generalized 2 to 3 Hz delta slowing. Hyperventilation and photic stimulation were not performed.   ABNORMALITY - Continuous slow, generalized IMPRESSION: This study is suggestive of moderate to severe diffuse encephalopathy, nonspecific etiology. No seizures or epileptiform discharges were seen throughout the recording. Lora Havens     Medications:        Assessment/ Plan:  45 y.o. male with a PMHx of ESRD on HD, posterior reversible encephalopathy syndrome, diabetes mellitus type 2, chronic systolic heart failure, anemia of chronic kidney disease, secondary hyperparathyroidism, malignant hypertension, acute respiratory failure, who was admitted to [facility] on 06/29/2020 for ongoing treatment of acute respiratory failure, posterior reversible encephalopathy syndrome, and end-stage renal disease.   1.  ESRD on HD.  Patient due for hemodialysis treatment again tomorrow.  Continue to use left upper extremity AV fistula.  2.  Anemia of chronic kidney disease.  Hemoglobin drifting down and currently 7.5.  Continue Retacrit 4000 units IV with dialysis.  Consider blood transfusion for hemoglobin of 7 or less.  3.  Secondary hyperparathyroidism.  Phosphorus  currently 3.0 and acceptable.  4.  Acute respiratory failure.  Maintain mechanical ventilation.  Difficult to wean from the ventilator given metabolic and hypertensive encephalopathy.  5.  Hypertension.  Continue amlodipine, clonidine, hydralazine, labetalol, and losartan.  6.  Bacteremia.  Gram-positive cocci in clusters noted.  Has right IJ PICC line in place.  Consider line holiday but defer infection issues to hospitalist.   LOS: 0 Andrienne Havener 7/8/20229:23 AM

## 2020-07-11 LAB — RENAL FUNCTION PANEL
Albumin: 1.8 g/dL — ABNORMAL LOW (ref 3.5–5.0)
Anion gap: 13 (ref 5–15)
BUN: 87 mg/dL — ABNORMAL HIGH (ref 6–20)
CO2: 26 mmol/L (ref 22–32)
Calcium: 8.8 mg/dL — ABNORMAL LOW (ref 8.9–10.3)
Chloride: 93 mmol/L — ABNORMAL LOW (ref 98–111)
Creatinine, Ser: 6.02 mg/dL — ABNORMAL HIGH (ref 0.61–1.24)
GFR, Estimated: 11 mL/min — ABNORMAL LOW (ref >60)
Glucose, Bld: 110 mg/dL — ABNORMAL HIGH (ref 70–99)
Phosphorus: 2.7 mg/dL (ref 2.5–4.6)
Potassium: 4.3 mmol/L (ref 3.5–5.1)
Sodium: 132 mmol/L — ABNORMAL LOW (ref 135–145)

## 2020-07-11 LAB — CBC
HCT: 22.7 % — ABNORMAL LOW (ref 39.0–52.0)
Hemoglobin: 7 g/dL — ABNORMAL LOW (ref 13.0–17.0)
MCH: 22.4 pg — ABNORMAL LOW (ref 26.0–34.0)
MCHC: 30.8 g/dL (ref 30.0–36.0)
MCV: 72.8 fL — ABNORMAL LOW (ref 80.0–100.0)
Platelets: 319 10*3/uL (ref 150–400)
RBC: 3.12 MIL/uL — ABNORMAL LOW (ref 4.22–5.81)
RDW: 16.2 % — ABNORMAL HIGH (ref 11.5–15.5)
WBC: 18.7 10*3/uL — ABNORMAL HIGH (ref 4.0–10.5)
nRBC: 0 % (ref 0.0–0.2)

## 2020-07-11 LAB — VANCOMYCIN, TROUGH: Vancomycin Tr: 9 ug/mL — ABNORMAL LOW (ref 15–20)

## 2020-07-11 LAB — PREPARE RBC (CROSSMATCH)

## 2020-07-11 LAB — ABO/RH: ABO/RH(D): O POS

## 2020-07-12 DIAGNOSIS — N186 End stage renal disease: Secondary | ICD-10-CM | POA: Diagnosis not present

## 2020-07-12 DIAGNOSIS — G9341 Metabolic encephalopathy: Secondary | ICD-10-CM | POA: Diagnosis not present

## 2020-07-12 DIAGNOSIS — I5032 Chronic diastolic (congestive) heart failure: Secondary | ICD-10-CM | POA: Diagnosis not present

## 2020-07-12 DIAGNOSIS — J9621 Acute and chronic respiratory failure with hypoxia: Secondary | ICD-10-CM | POA: Diagnosis not present

## 2020-07-12 LAB — BPAM RBC
Blood Product Expiration Date: 202208092359
ISSUE DATE / TIME: 202207091839
Unit Type and Rh: 5100

## 2020-07-12 LAB — TYPE AND SCREEN
ABO/RH(D): O POS
Antibody Screen: NEGATIVE
Unit division: 0

## 2020-07-12 LAB — CBC
HCT: 26 % — ABNORMAL LOW (ref 39.0–52.0)
Hemoglobin: 8.3 g/dL — ABNORMAL LOW (ref 13.0–17.0)
MCH: 23.3 pg — ABNORMAL LOW (ref 26.0–34.0)
MCHC: 31.9 g/dL (ref 30.0–36.0)
MCV: 73 fL — ABNORMAL LOW (ref 80.0–100.0)
Platelets: 298 10*3/uL (ref 150–400)
RBC: 3.56 MIL/uL — ABNORMAL LOW (ref 4.22–5.81)
RDW: 16.6 % — ABNORMAL HIGH (ref 11.5–15.5)
WBC: 18.7 10*3/uL — ABNORMAL HIGH (ref 4.0–10.5)
nRBC: 0 % (ref 0.0–0.2)

## 2020-07-12 LAB — HEMOGLOBIN A1C
Hgb A1c MFr Bld: 11 % — ABNORMAL HIGH (ref 4.8–5.6)
Mean Plasma Glucose: 269 mg/dL

## 2020-07-12 NOTE — Progress Notes (Signed)
Pulmonary [place]   PULMONARY CRITICAL CARE SERVICE  PROGRESS NOTE     [name]  [id number]  DOB: November 29, 1975   DOA: 06/29/2020  Referring Physician: [name], MD  HPI: Jeffery Andrews is a 45 y.o. male being followed for ventilator/airway/oxygen weaning Acute on Chronic Respiratory Failure.  Patient remains grossly unchanged has been moving the ventilator remains intubated has been on pressure support which he is tolerating  Medications: Reviewed on Rounds  Physical Exam:  Vitals: Temperature 99.6 pulse 77 respiratory 12 blood pressure is 150/84 saturations 100%  Ventilator Settings currently on pressure support 5/5  General: Comfortable at this time Neck: supple Cardiovascular: no malignant arrhythmias Respiratory: No rhonchi very coarse breath sounds Skin: no rash seen on limited exam Musculoskeletal: No gross abnormality Psychiatric:unable to assess Neurologic:no involuntary movements         Lab Data:   Basic Metabolic Panel: Recent Labs  Lab 07/07/20 0737 07/09/20 0800 07/10/20 0659 07/11/20 0813  NA 130* 131* 132* 132*  K 4.8 4.3 4.3 4.3  CL 91* 93* 94* 93*  CO2 '27 26 28 26  '$ GLUCOSE 59* 206* 98 110*  BUN 98* 84* 58* 87*  CREATININE 6.73* 6.36* 4.78* 6.02*  CALCIUM 8.9 8.7* 8.8* 8.8*  MG  --   --  2.3  --   PHOS 3.2 3.0  --  2.7    ABG: No results for input(s): PHART, PCO2ART, PO2ART, HCO3, O2SAT in the last 168 hours.  Liver Function Tests: Recent Labs  Lab 07/07/20 0737 07/09/20 0800 07/11/20 0813  ALBUMIN 1.9* 2.0* 1.8*   No results for input(s): LIPASE, AMYLASE in the last 168 hours. No results for input(s): AMMONIA in the last 168 hours.  CBC: Recent Labs  Lab 07/07/20 0737 07/09/20 0800 07/10/20 0659 07/11/20 0813 07/12/20 0307  WBC 15.0* 16.6* 18.0* 18.7* 18.7*  HGB 7.9* 7.7* 7.5* 7.0* 8.3*  HCT 25.2* 25.6* 24.6* 22.7* 26.0*  MCV 72.0* 73.6* 73.4* 72.8* 73.0*   PLT 265 295 300 319 298    Cardiac Enzymes: No results for input(s): CKTOTAL, CKMB, CKMBINDEX, TROPONINI in the last 168 hours.  BNP (last 3 results) No results for input(s): BNP in the last 8760 hours.  ProBNP (last 3 results) No results for input(s): PROBNP in the last 8760 hours.  Radiological Exams: No results found.  Assessment/Plan Active Problems:   Acute on chronic respiratory failure with hypoxia (HCC)   End stage renal disease on dialysis (HCC)   Lobar pneumonia, unspecified organism (HCC)   Acute metabolic encephalopathy   Chronic diastolic CHF (congestive heart failure) (HCC)   Acute on chronic respiratory failure hypoxia we will continue with the pressure support 5/5.  Because of the patient's mental status and multiple other factors await ENT input for possibility of doing a tracheostomy. End-stage renal failure on hemodialysis we will continue to monitor closely. Lobar pneumonia treated we will continue present management Acute metabolic encephalopathy no change Chronic diastolic heart failure patient is compensated at this time   I have personally seen and evaluated the patient, evaluated laboratory and imaging results, formulated the assessment and plan and placed orders. The Patient requires high complexity decision making with multiple systems involvement.  Rounds were done with the Respiratory Therapy Director and Staff therapists and discussed with nursing staff also.  Allyne Gee, MD [facility] Pulmonary Critical Care Medicine Sleep Medicine

## 2020-07-12 NOTE — Progress Notes (Signed)
Pulmonary [place]ery Andrews   PULMONARY CRITICAL CARE SERVICE  PROGRESS NOTE     Jeffery Andrews  [id number]  DOB: 27-Feb-1975   DOA: 06/29/2020  Referring Physician: [name], MD  HPI: Jeffery Andrews is a 45 y.o. male being followed for ventilator/airway/oxygen weaning Acute on Chronic Respiratory Failure.  Patient is on pressure support currently still on a pressure support of 5/5 awaiting a tracheostomy  Medications: Reviewed on Rounds  Physical Exam:  Vitals: Temperature is 99.0 pulse 80 respiratory 24 blood pressure is 182/81 saturations 100%  Ventilator Settings on pressure support FiO2 28% pressure 5/5  General: Comfortable at this time Neck: supple Cardiovascular: no malignant arrhythmias Respiratory: No rhonchi very coarse breath sounds Skin: no rash seen on limited exam Musculoskeletal: No gross abnormality Psychiatric:unable to assess Neurologic:no involuntary movements         Lab Data:   Basic Metabolic Panel: Recent Labs  Lab 07/07/20 0737 07/09/20 0800 07/10/20 0659 07/11/20 0813  NA 130* 131* 132* 132*  K 4.8 4.3 4.3 4.3  CL 91* 93* 94* 93*  CO2 '27 26 28 26  '$ GLUCOSE 59* 206* 98 110*  BUN 98* 84* 58* 87*  CREATININE 6.73* 6.36* 4.78* 6.02*  CALCIUM 8.9 8.7* 8.8* 8.8*  MG  --   --  2.3  --   PHOS 3.2 3.0  --  2.7    ABG: No results for input(s): PHART, PCO2ART, PO2ART, HCO3, O2SAT in the last 168 hours.  Liver Function Tests: Recent Labs  Lab 07/07/20 0737 07/09/20 0800 07/11/20 0813  ALBUMIN 1.9* 2.0* 1.8*   No results for input(s): LIPASE, AMYLASE in the last 168 hours. No results for input(s): AMMONIA in the last 168 hours.  CBC: Recent Labs  Lab 07/07/20 0737 07/09/20 0800 07/10/20 0659 07/11/20 0813 07/12/20 0307  WBC 15.0* 16.6* 18.0* 18.7* 18.7*  HGB 7.9* 7.7* 7.5* 7.0* 8.3*  HCT 25.2* 25.6* 24.6* 22.7* 26.0*  MCV 72.0* 73.6* 73.4* 72.8* 73.0*  PLT 265 295 300 319 298     Cardiac Enzymes: No results for input(s): CKTOTAL, CKMB, CKMBINDEX, TROPONINI in the last 168 hours.  BNP (last 3 results) No results for input(s): BNP in the last 8760 hours.  ProBNP (last 3 results) No results for input(s): PROBNP in the last 8760 hours.  Radiological Exams: No results found.  Assessment/Plan Active Problems:   Acute on chronic respiratory failure with hypoxia (HCC)   End stage renal disease on dialysis (HCC)   Lobar pneumonia, unspecified organism (HCC)   Acute metabolic encephalopathy   Chronic diastolic CHF (congestive heart failure) (HCC)   Acute on chronic respiratory failure hypoxia we will continue with the pressure support right now that he is actually tolerating it well we are awaiting tracheostomy based on periods of apnea I think it will be beneficial to have a secure airway End-stage renal disease on hemodialysis we will continue to monitor closely Lobar pneumonia supportive care has been treated with antibiotics Anoxic encephalopathy metabolic encephalopathy I believe the patient has suffered a diffuse global anoxic event EEG is suggestive Chronic diastolic heart failure right now is compensated we will continue to monitor.   I have personally seen and evaluated the patient, evaluated laboratory and imaging results, formulated the assessment and plan and placed orders. The Patient requires high complexity decision making with multiple systems involvement.  Rounds were done with the Respiratory Therapy Director and Staff therapists and discussed with nursing staff also.  Jeffery Loll A  Humphrey Rolls, MD [facility] Pulmonary Critical Care Medicine Sleep Medicine

## 2020-07-13 DIAGNOSIS — J9621 Acute and chronic respiratory failure with hypoxia: Secondary | ICD-10-CM | POA: Diagnosis not present

## 2020-07-13 DIAGNOSIS — I5032 Chronic diastolic (congestive) heart failure: Secondary | ICD-10-CM | POA: Diagnosis not present

## 2020-07-13 DIAGNOSIS — G9341 Metabolic encephalopathy: Secondary | ICD-10-CM | POA: Diagnosis not present

## 2020-07-13 DIAGNOSIS — N186 End stage renal disease: Secondary | ICD-10-CM | POA: Diagnosis not present

## 2020-07-13 LAB — CULTURE, BLOOD (ROUTINE X 2): Special Requests: ADEQUATE

## 2020-07-13 NOTE — Progress Notes (Signed)
Pulmonary [place]   PULMONARY CRITICAL CARE SERVICE  PROGRESS NOTE     Jeffery Andrews  [id number]  DOB: 01-26-1975   DOA: 06/29/2020  Referring Physician: [name], MD  HPI: Jeffery Andrews is a 45 y.o. male being followed for ventilator/airway/oxygen weaning Acute on Chronic Respiratory Failure.  Patient at this time is on pressure support has been on a pressure of 5/5 which the patient is tolerating without any major difficulty  Medications: Reviewed on Rounds  Physical Exam:  Vitals: Temperature 100.8 pulse 79 respiratory 21 blood pressure 181/91 saturations 100%  Ventilator Settings orally intubated on the ventilator on pressure support FiO2 28% pressure 5/5  General: Comfortable at this time Neck: supple Cardiovascular: no malignant arrhythmias Respiratory: Coarse breath sounds with few scattered rhonchi Skin: no rash seen on limited exam Musculoskeletal: No gross abnormality Psychiatric:unable to assess Neurologic:no involuntary movements         Lab Data:   Basic Metabolic Panel: Recent Labs  Lab 07/07/20 0737 07/09/20 0800 07/10/20 0659 07/11/20 0813  NA 130* 131* 132* 132*  K 4.8 4.3 4.3 4.3  CL 91* 93* 94* 93*  CO2 '27 26 28 26  '$ GLUCOSE 59* 206* 98 110*  BUN 98* 84* 58* 87*  CREATININE 6.73* 6.36* 4.78* 6.02*  CALCIUM 8.9 8.7* 8.8* 8.8*  MG  --   --  2.3  --   PHOS 3.2 3.0  --  2.7    ABG: No results for input(s): PHART, PCO2ART, PO2ART, HCO3, O2SAT in the last 168 hours.  Liver Function Tests: Recent Labs  Lab 07/07/20 0737 07/09/20 0800 07/11/20 0813  ALBUMIN 1.9* 2.0* 1.8*   No results for input(s): LIPASE, AMYLASE in the last 168 hours. No results for input(s): AMMONIA in the last 168 hours.  CBC: Recent Labs  Lab 07/07/20 0737 07/09/20 0800 07/10/20 0659 07/11/20 0813 07/12/20 0307  WBC 15.0* 16.6* 18.0* 18.7* 18.7*  HGB 7.9* 7.7* 7.5* 7.0* 8.3*  HCT 25.2* 25.6*  24.6* 22.7* 26.0*  MCV 72.0* 73.6* 73.4* 72.8* 73.0*  PLT 265 295 300 319 298    Cardiac Enzymes: No results for input(s): CKTOTAL, CKMB, CKMBINDEX, TROPONINI in the last 168 hours.  BNP (last 3 results) No results for input(s): BNP in the last 8760 hours.  ProBNP (last 3 results) No results for input(s): PROBNP in the last 8760 hours.  Radiological Exams: No results found.  Assessment/Plan Active Problems:   Acute on chronic respiratory failure with hypoxia (HCC)   End stage renal disease on dialysis (HCC)   Lobar pneumonia, unspecified organism (HCC)   Acute metabolic encephalopathy   Chronic diastolic CHF (congestive heart failure) (HCC)   Acute on chronic respiratory failure with hypoxia patient is on pressure support 5/5 which the patient is tolerating however has intermittent apneas noted.  Based on the wishes of the family the patient is going to have a tracheostomy consultation for ENT to evaluate has been placed End-stage renal disease patient is on hemodialysis which will be continued. 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 The Patient requires high complexity decision making with multiple systems involvement.  Rounds were done with  the Respiratory Therapy Director and Staff therapists and discussed with nursing staff also.  Allyne Gee, MD [facility] Pulmonary Critical Care Medicine Sleep Medicine

## 2020-07-14 DIAGNOSIS — N186 End stage renal disease: Secondary | ICD-10-CM | POA: Diagnosis not present

## 2020-07-14 DIAGNOSIS — G9341 Metabolic encephalopathy: Secondary | ICD-10-CM | POA: Diagnosis not present

## 2020-07-14 DIAGNOSIS — I5032 Chronic diastolic (congestive) heart failure: Secondary | ICD-10-CM | POA: Diagnosis not present

## 2020-07-14 DIAGNOSIS — J9621 Acute and chronic respiratory failure with hypoxia: Secondary | ICD-10-CM | POA: Diagnosis not present

## 2020-07-14 LAB — RENAL FUNCTION PANEL
Albumin: 1.9 g/dL — ABNORMAL LOW (ref 3.5–5.0)
Anion gap: 12 (ref 5–15)
BUN: 91 mg/dL — ABNORMAL HIGH (ref 6–20)
CO2: 26 mmol/L (ref 22–32)
Calcium: 8.7 mg/dL — ABNORMAL LOW (ref 8.9–10.3)
Chloride: 94 mmol/L — ABNORMAL LOW (ref 98–111)
Creatinine, Ser: 6.04 mg/dL — ABNORMAL HIGH (ref 0.61–1.24)
GFR, Estimated: 11 mL/min — ABNORMAL LOW (ref >60)
Glucose, Bld: 224 mg/dL — ABNORMAL HIGH (ref 70–99)
Phosphorus: 3.2 mg/dL (ref 2.5–4.6)
Potassium: 4.2 mmol/L (ref 3.5–5.1)
Sodium: 132 mmol/L — ABNORMAL LOW (ref 135–145)

## 2020-07-14 LAB — CBC
HCT: 21.9 % — ABNORMAL LOW (ref 39.0–52.0)
Hemoglobin: 7 g/dL — ABNORMAL LOW (ref 13.0–17.0)
MCH: 23.4 pg — ABNORMAL LOW (ref 26.0–34.0)
MCHC: 32 g/dL (ref 30.0–36.0)
MCV: 73.2 fL — ABNORMAL LOW (ref 80.0–100.0)
Platelets: 332 10*3/uL (ref 150–400)
RBC: 2.99 MIL/uL — ABNORMAL LOW (ref 4.22–5.81)
RDW: 16.8 % — ABNORMAL HIGH (ref 11.5–15.5)
WBC: 15.5 10*3/uL — ABNORMAL HIGH (ref 4.0–10.5)
nRBC: 0 % (ref 0.0–0.2)

## 2020-07-14 LAB — VANCOMYCIN, TROUGH: Vancomycin Tr: 24 ug/mL (ref 15–20)

## 2020-07-14 NOTE — Progress Notes (Signed)
Pulmonary [name]   PULMONARY CRITICAL CARE SERVICE  PROGRESS NOTE     Jeffery Andrews  [id number]  DOB: Apr 05, 1975   DOA: 06/29/2020  Referring Physician: [name], MD  HPI: Jeffery Andrews is a 45 y.o. male being followed for ventilator/airway/oxygen weaning Acute on Chronic Respiratory Failure.  Patient is on pressure support currently on 28% FiO2  Medications: Reviewed on Rounds  Physical Exam:  Vitals: Temperature is 99.2 pulse 69 respiratory to 16 blood pressure is 178/78 saturations 100%  Ventilator Settings pressure support FiO2 28% pressure 5/5  General: Comfortable at this time Neck: supple Cardiovascular: no malignant arrhythmias Respiratory: Coarse rhonchi Skin: no rash seen on limited exam Musculoskeletal: No gross abnormality Psychiatric:unable to assess Neurologic:no involuntary movements         Lab Data:   Basic Metabolic Panel: Recent Labs  Lab 07/09/20 0800 07/10/20 0659 07/11/20 0813 07/14/20 0810  NA 131* 132* 132* 132*  K 4.3 4.3 4.3 4.2  CL 93* 94* 93* 94*  CO2 '26 28 26 26  '$ GLUCOSE 206* 98 110* 224*  BUN 84* 58* 87* 91*  CREATININE 6.36* 4.78* 6.02* 6.04*  CALCIUM 8.7* 8.8* 8.8* 8.7*  MG  --  2.3  --   --   PHOS 3.0  --  2.7 3.2    ABG: No results for input(s): PHART, PCO2ART, PO2ART, HCO3, O2SAT in the last 168 hours.  Liver Function Tests: Recent Labs  Lab 07/09/20 0800 07/11/20 0813 07/14/20 0810  ALBUMIN 2.0* 1.8* 1.9*   No results for input(s): LIPASE, AMYLASE in the last 168 hours. No results for input(s): AMMONIA in the last 168 hours.  CBC: Recent Labs  Lab 07/09/20 0800 07/10/20 0659 07/11/20 0813 07/12/20 0307 07/14/20 0810  WBC 16.6* 18.0* 18.7* 18.7* 15.5*  HGB 7.7* 7.5* 7.0* 8.3* 7.0*  HCT 25.6* 24.6* 22.7* 26.0* 21.9*  MCV 73.6* 73.4* 72.8* 73.0* 73.2*  PLT 295 300 319 298 332    Cardiac Enzymes: No results for input(s): CKTOTAL, CKMB,  CKMBINDEX, TROPONINI in the last 168 hours.  BNP (last 3 results) No results for input(s): BNP in the last 8760 hours.  ProBNP (last 3 results) No results for input(s): PROBNP in the last 8760 hours.  Radiological Exams: No results found.  Assessment/Plan Active Problems:   Acute on chronic respiratory failure with hypoxia (HCC)   End stage renal disease on dialysis (HCC)   Lobar pneumonia, unspecified organism (HCC)   Acute metabolic encephalopathy   Chronic diastolic CHF (congestive heart failure) (HCC)   Acute on chronic respiratory failure hypoxia patient is doing fine on the pressure support.  Per family request waiting on tracheostomy apparently ENT is out of town I have put the consultation and will await for trach End-stage renal failure on hemodialysis Lobar pneumonia has been treated with antibiotics Diffuse global anoxic encephalopathy no change Chronic heart failure diastolic dysfunction supportive care monitor fluid status   I have personally seen and evaluated the patient, evaluated laboratory and imaging results, formulated the assessment and plan and placed orders. The Patient requires high complexity decision making with multiple systems involvement.  Rounds were done with the Respiratory Therapy Director and Staff therapists and discussed with nursing staff also.  Allyne Gee, MD [facility] Pulmonary Critical Care Medicine Sleep Medicine

## 2020-07-15 DIAGNOSIS — N186 End stage renal disease: Secondary | ICD-10-CM | POA: Diagnosis not present

## 2020-07-15 DIAGNOSIS — I5032 Chronic diastolic (congestive) heart failure: Secondary | ICD-10-CM | POA: Diagnosis not present

## 2020-07-15 DIAGNOSIS — J9620 Acute and chronic respiratory failure, unspecified whether with hypoxia or hypercapnia: Secondary | ICD-10-CM

## 2020-07-15 DIAGNOSIS — J9621 Acute and chronic respiratory failure with hypoxia: Secondary | ICD-10-CM | POA: Diagnosis not present

## 2020-07-15 DIAGNOSIS — G9341 Metabolic encephalopathy: Secondary | ICD-10-CM | POA: Diagnosis not present

## 2020-07-15 LAB — BLOOD CULTURE ID PANEL (REFLEXED) - BCID2

## 2020-07-15 LAB — CBC
HCT: 26.6 % — ABNORMAL LOW (ref 39.0–52.0)
Hemoglobin: 8.2 g/dL — ABNORMAL LOW (ref 13.0–17.0)
MCH: 22.8 pg — ABNORMAL LOW (ref 26.0–34.0)
MCHC: 30.8 g/dL (ref 30.0–36.0)
MCV: 73.9 fL — ABNORMAL LOW (ref 80.0–100.0)
Platelets: 415 10*3/uL — ABNORMAL HIGH (ref 150–400)
RBC: 3.6 MIL/uL — ABNORMAL LOW (ref 4.22–5.81)
RDW: 16.8 % — ABNORMAL HIGH (ref 11.5–15.5)
WBC: 15.8 10*3/uL — ABNORMAL HIGH (ref 4.0–10.5)
nRBC: 0 % (ref 0.0–0.2)

## 2020-07-15 LAB — PREPARE RBC (CROSSMATCH)

## 2020-07-15 NOTE — Progress Notes (Signed)
[facility]  ROUNDING NOTE   Subjective:  Patient seen at bedside. Still appears to have contractures in both upper extremities. Has been difficult to wean from the ventilator.  Objective:  Vital signs in last 24 hours:  Temperature 99 pulse 76 respirations 20 blood pressure 168/85   Physical Exam: General:  Critically ill-appearing  Head:  Normocephalic, atraumatic.  Endotracheal tube in place  Eyes:  Anicteric  Neck:  Supple  Lungs:   Clear to auscultation, vent assisted  Heart:  S1S2 no rubs  Abdomen:   Soft, nontender, bowel sounds present  Extremities:  Trace peripheral edema.  Neurologic:  Intubated, not following commands, contractures in both upper extremities.  Skin:  No acute skin rash  Access:  Left upper extremity AV fistula    Basic Metabolic Panel: Recent Labs  Lab 07/09/20 0800 07/10/20 0659 07/11/20 0813 07/14/20 0810  NA 131* 132* 132* 132*  K 4.3 4.3 4.3 4.2  CL 93* 94* 93* 94*  CO2 '26 28 26 26  '$ GLUCOSE 206* 98 110* 224*  BUN 84* 58* 87* 91*  CREATININE 6.36* 4.78* 6.02* 6.04*  CALCIUM 8.7* 8.8* 8.8* 8.7*  MG  --  2.3  --   --   PHOS 3.0  --  2.7 3.2     Liver Function Tests: Recent Labs  Lab 07/09/20 0800 07/11/20 0813 07/14/20 0810  ALBUMIN 2.0* 1.8* 1.9*    No results for input(s): LIPASE, AMYLASE in the last 168 hours. No results for input(s): AMMONIA in the last 168 hours.  CBC: Recent Labs  Lab 07/09/20 0800 07/10/20 0659 07/11/20 0813 07/12/20 0307 07/14/20 0810  WBC 16.6* 18.0* 18.7* 18.7* 15.5*  HGB 7.7* 7.5* 7.0* 8.3* 7.0*  HCT 25.6* 24.6* 22.7* 26.0* 21.9*  MCV 73.6* 73.4* 72.8* 73.0* 73.2*  PLT 295 300 319 298 332     Cardiac Enzymes: No results for input(s): CKTOTAL, CKMB, CKMBINDEX, TROPONINI in the last 168 hours.  BNP: Invalid input(s): POCBNP  CBG: No results for input(s): GLUCAP in the last 168 hours.  Microbiology: Results for orders placed or performed during the hospital encounter  of 06/29/20  Culture, Respiratory w Gram Stain     Status: None   Collection Time: 06/30/20  4:00 PM   Specimen: Tracheal Aspirate; Respiratory  Result Value Ref Range Status   Specimen Description TRACHEAL ASPIRATE  Final   Special Requests NONE  Final   Gram Stain   Final    FEW WBC PRESENT,BOTH PMN AND MONONUCLEAR MODERATE GRAM NEGATIVE COCCOBACILLI RARE GRAM POSITIVE COCCI IN PAIRS FEW GRAM POSITIVE RODS Performed at [facility], [place] [address], [place], [place] [address]    Culture   Final    ABUNDANT HAEMOPHILUS INFLUENZAE BETA LACTAMASE NEGATIVE FEW STAPHYLOCOCCUS AUREUS    Report Status 07/04/2020 FINAL  Final   Organism ID, Bacteria STAPHYLOCOCCUS AUREUS  Final      Susceptibility   Staphylococcus aureus - MIC*    CIPROFLOXACIN <=0.5 SENSITIVE Sensitive     ERYTHROMYCIN >=8 RESISTANT Resistant     GENTAMICIN <=0.5 SENSITIVE Sensitive     OXACILLIN <=0.25 SENSITIVE Sensitive     TETRACYCLINE <=1 SENSITIVE Sensitive     VANCOMYCIN <=0.5 SENSITIVE Sensitive     TRIMETH/SULFA <=10 SENSITIVE Sensitive     CLINDAMYCIN <=0.25 SENSITIVE Sensitive     RIFAMPIN <=0.5 SENSITIVE Sensitive     Inducible Clindamycin NEGATIVE Sensitive     * FEW STAPHYLOCOCCUS AUREUS  Culture, blood (routine x 2)  Status: Abnormal   Collection Time: 07/08/20  7:12 PM   Specimen: BLOOD  Result Value Ref Range Status   Specimen Description BLOOD SITE NOT SPECIFIED  Final   Special Requests   Final    BOTTLES DRAWN AEROBIC AND ANAEROBIC Blood Culture adequate volume   Culture  Setup Time   Final    GRAM POSITIVE COCCI IN CLUSTERS AEROBIC BOTTLE ONLY CRITICAL VALUE NOTED.  VALUE IS CONSISTENT WITH PREVIOUSLY REPORTED AND CALLED VALUE.    Culture (A)  Final    STAPHYLOCOCCUS HOMINIS THE SIGNIFICANCE OF ISOLATING THIS ORGANISM FROM A SINGLE SET OF BLOOD CULTURES WHEN MULTIPLE SETS ARE DRAWN IS UNCERTAIN. PLEASE NOTIFY THE MICROBIOLOGY DEPARTMENT WITHIN ONE WEEK IF SPECIATION AND  SENSITIVITIES ARE REQUIRED. Performed at [facility], [place] [address], [place], [place] [address]    Report Status 07/13/2020 FINAL  Final  Culture, blood (routine x 2)     Status: Abnormal   Collection Time: 07/08/20  7:12 PM   Specimen: BLOOD  Result Value Ref Range Status   Specimen Description BLOOD SITE NOT SPECIFIED  Final   Special Requests   Final    BOTTLES DRAWN AEROBIC AND ANAEROBIC Blood Culture results may not be optimal due to an inadequate volume of blood received in culture bottles   Culture  Setup Time   Final    GRAM POSITIVE COCCI IN CLUSTERS AEROBIC BOTTLE ONLY Organism ID to follow CRITICAL RESULT CALLED TO, READ BACK BY AND VERIFIED WITH: Mirian Mo RN, [id number] 07/10/20 D.VANHOOK    Culture (A)  Final    STAPHYLOCOCCUS EPIDERMIDIS THE SIGNIFICANCE OF ISOLATING THIS ORGANISM FROM A SINGLE SET OF BLOOD CULTURES WHEN MULTIPLE SETS ARE DRAWN IS UNCERTAIN. PLEASE NOTIFY THE MICROBIOLOGY DEPARTMENT WITHIN ONE WEEK IF SPECIATION AND SENSITIVITIES ARE REQUIRED. Performed at [facility], [place] [address], [place], [place] [address]    Report Status 07/10/2020 FINAL  Final  Blood Culture ID Panel (Reflexed)     Status: Abnormal   Collection Time: 07/08/20  7:12 PM  Result Value Ref Range Status   Enterococcus faecalis NOT DETECTED NOT DETECTED Final   Enterococcus Faecium NOT DETECTED NOT DETECTED Final   Listeria monocytogenes NOT DETECTED NOT DETECTED Final   Staphylococcus species DETECTED (A) NOT DETECTED Final   Staphylococcus aureus (BCID) NOT DETECTED NOT DETECTED Final   Staphylococcus epidermidis DETECTED (A) NOT DETECTED Final    Comment: Methicillin (oxacillin) resistant coagulase negative staphylococcus. Possible blood culture contaminant (unless isolated from more than one blood culture draw or clinical case suggests pathogenicity). No antibiotic treatment is indicated for blood  culture contaminants.    Staphylococcus lugdunensis NOT DETECTED NOT  DETECTED Final   Streptococcus species NOT DETECTED NOT DETECTED Final   Streptococcus agalactiae NOT DETECTED NOT DETECTED Final   Streptococcus pneumoniae NOT DETECTED NOT DETECTED Final   Streptococcus pyogenes NOT DETECTED NOT DETECTED Final   A.calcoaceticus-baumannii NOT DETECTED NOT DETECTED Final   Bacteroides fragilis NOT DETECTED NOT DETECTED Final   Enterobacterales NOT DETECTED NOT DETECTED Final   Enterobacter cloacae complex NOT DETECTED NOT DETECTED Final   Escherichia coli NOT DETECTED NOT DETECTED Final   Klebsiella aerogenes NOT DETECTED NOT DETECTED Final   Klebsiella oxytoca NOT DETECTED NOT DETECTED Final   Klebsiella pneumoniae NOT DETECTED NOT DETECTED Final   Proteus species NOT DETECTED NOT DETECTED Final   Salmonella species NOT DETECTED NOT DETECTED Final   Serratia marcescens NOT DETECTED NOT DETECTED Final   Haemophilus influenzae  NOT DETECTED NOT DETECTED Final   Neisseria meningitidis NOT DETECTED NOT DETECTED Final   Pseudomonas aeruginosa NOT DETECTED NOT DETECTED Final   Stenotrophomonas maltophilia NOT DETECTED NOT DETECTED Final   Candida albicans NOT DETECTED NOT DETECTED Final   Candida auris NOT DETECTED NOT DETECTED Final   Candida glabrata NOT DETECTED NOT DETECTED Final   Candida krusei NOT DETECTED NOT DETECTED Final   Candida parapsilosis NOT DETECTED NOT DETECTED Final   Candida tropicalis NOT DETECTED NOT DETECTED Final   Cryptococcus neoformans/gattii NOT DETECTED NOT DETECTED Final   Methicillin resistance mecA/C DETECTED (A) NOT DETECTED Final    Comment: Performed at [facility], [place] [address], [place], [place] [address]    Coagulation Studies: No results for input(s): LABPROT, INR in the last 72 hours.  Urinalysis: No results for input(s): COLORURINE, LABSPEC, PHURINE, GLUCOSEU, HGBUR, BILIRUBINUR, KETONESUR, PROTEINUR, UROBILINOGEN, NITRITE, LEUKOCYTESUR in the last 72 hours.  Invalid input(s): APPERANCEUR     Imaging: No results found.   Medications:       Assessment/ Plan:  45 y.o. male with a PMHx of ESRD on HD, posterior reversible encephalopathy syndrome, diabetes mellitus type 2, chronic systolic heart failure, anemia of chronic kidney disease, secondary hyperparathyroidism, malignant hypertension, acute respiratory failure, who was admitted to [facility] on 06/29/2020 for ongoing treatment of acute respiratory failure, posterior reversible encephalopathy syndrome, and end-stage renal disease.   1.  ESRD on HD.  Maintain the patient on a TTS dialysis schedule at this time.  Next dialysis treatment scheduled for tomorrow.  2.  Anemia of chronic kidney disease.  H hemoglobin down to 7.0.  Consider blood transfusion but defer to primary team.  3.  Secondary hyperparathyroidism.  Phosphorus remains within acceptable range and currently 3.2.  4.  Acute respiratory failure.  Patient maintained on mechanical ventilation.  May end up requiring tracheostomy placement.  Difficult to wean secondary to all underlying mental status from hypertensive encephalopathy.  5.  Hypertension.  Blood pressure continues to be labile and currently 168/85.  Continue amlodipine, clonidine, dialysis, labetalol, losartan.  6.  Bacteremia.  Gram-positive cocci in clusters noted previously. Line management and antibiotic management per hospitalist.    LOS: 0 Jeffery Andrews 7/13/202211:10 AM

## 2020-07-15 NOTE — Progress Notes (Signed)
Pulmonary [place]   PULMONARY CRITICAL CARE SERVICE  PROGRESS NOTE     Jeffery Andrews  [id number]  DOB: 1975-06-08   DOA: 06/29/2020  Referring Physician: [name], MD  HPI: Jeffery Andrews is a 45 y.o. male being followed for ventilator/airway/oxygen weaning Acute on Chronic Respiratory Failure.  Patient is currently on pressure support has been on 5/5 minutes right now on 28% FiO2  Medications: Reviewed on Rounds  Physical Exam:  Vitals: Temperature is 99.0 pulse 76 respiratory rate is 20 blood pressures 168/88 saturations 100%  Ventilator Settings on pressure support FiO2 28% pressure 5/5  General: Comfortable at this time Neck: supple Cardiovascular: no malignant arrhythmias Respiratory: No rhonchi very coarse breath sounds Skin: no rash seen on limited exam Musculoskeletal: No gross abnormality Psychiatric:unable to assess Neurologic:no involuntary movements         Lab Data:   Basic Metabolic Panel: Recent Labs  Lab 07/09/20 0800 07/10/20 0659 07/11/20 0813 07/14/20 0810  NA 131* 132* 132* 132*  K 4.3 4.3 4.3 4.2  CL 93* 94* 93* 94*  CO2 '26 28 26 26  '$ GLUCOSE 206* 98 110* 224*  BUN 84* 58* 87* 91*  CREATININE 6.36* 4.78* 6.02* 6.04*  CALCIUM 8.7* 8.8* 8.8* 8.7*  MG  --  2.3  --   --   PHOS 3.0  --  2.7 3.2    ABG: No results for input(s): PHART, PCO2ART, PO2ART, HCO3, O2SAT in the last 168 hours.  Liver Function Tests: Recent Labs  Lab 07/09/20 0800 07/11/20 0813 07/14/20 0810  ALBUMIN 2.0* 1.8* 1.9*   No results for input(s): LIPASE, AMYLASE in the last 168 hours. No results for input(s): AMMONIA in the last 168 hours.  CBC: Recent Labs  Lab 07/09/20 0800 07/10/20 0659 07/11/20 0813 07/12/20 0307 07/14/20 0810  WBC 16.6* 18.0* 18.7* 18.7* 15.5*  HGB 7.7* 7.5* 7.0* 8.3* 7.0*  HCT 25.6* 24.6* 22.7* 26.0* 21.9*  MCV 73.6* 73.4* 72.8* 73.0* 73.2*  PLT 295 300 319 298 332     Cardiac Enzymes: No results for input(s): CKTOTAL, CKMB, CKMBINDEX, TROPONINI in the last 168 hours.  BNP (last 3 results) No results for input(s): BNP in the last 8760 hours.  ProBNP (last 3 results) No results for input(s): PROBNP in the last 8760 hours.  Radiological Exams: No results found.  Assessment/Plan Active Problems:   Acute on chronic respiratory failure with hypoxia (HCC)   End stage renal disease on dialysis (HCC)   Lobar pneumonia, unspecified organism (HCC)   Acute metabolic encephalopathy   Chronic diastolic CHF (congestive heart failure) (HCC)   Acute on chronic respiratory failure hypoxia we will continue to wean on pressure support.  We are waiting for tracheostomy End-stage renal failure on hemodialysis Lobar pneumonia has been treated with antibiotics we will continue to monitor closely. Metabolic encephalopathy no change Chronic diastolic heart failure at baseline   I have personally seen and evaluated the patient, evaluated laboratory and imaging results, formulated the assessment and plan and placed orders. The Patient requires high complexity decision making with multiple systems involvement.  Rounds were done with the Respiratory Therapy Director and Staff therapists and discussed with nursing staff also.  Allyne Gee, MD [facility] Pulmonary Critical Care Medicine Sleep Medicine

## 2020-07-16 ENCOUNTER — Ambulatory Visit (INDEPENDENT_AMBULATORY_CARE_PROVIDER_SITE_OTHER): Payer: Self-pay | Admitting: Otolaryngology

## 2020-07-16 ENCOUNTER — Encounter (HOSPITAL_COMMUNITY): Payer: Self-pay | Admitting: Certified Registered"

## 2020-07-16 DIAGNOSIS — J9610 Chronic respiratory failure, unspecified whether with hypoxia or hypercapnia: Secondary | ICD-10-CM

## 2020-07-16 DIAGNOSIS — J9621 Acute and chronic respiratory failure with hypoxia: Secondary | ICD-10-CM | POA: Diagnosis not present

## 2020-07-16 DIAGNOSIS — I5032 Chronic diastolic (congestive) heart failure: Secondary | ICD-10-CM | POA: Diagnosis not present

## 2020-07-16 DIAGNOSIS — G9341 Metabolic encephalopathy: Secondary | ICD-10-CM | POA: Diagnosis not present

## 2020-07-16 DIAGNOSIS — N186 End stage renal disease: Secondary | ICD-10-CM | POA: Diagnosis not present

## 2020-07-16 LAB — CBC
HCT: 23.4 % — ABNORMAL LOW (ref 39.0–52.0)
Hemoglobin: 7.1 g/dL — ABNORMAL LOW (ref 13.0–17.0)
MCH: 22.4 pg — ABNORMAL LOW (ref 26.0–34.0)
MCHC: 30.3 g/dL (ref 30.0–36.0)
MCV: 73.8 fL — ABNORMAL LOW (ref 80.0–100.0)
Platelets: 371 10*3/uL (ref 150–400)
RBC: 3.17 MIL/uL — ABNORMAL LOW (ref 4.22–5.81)
RDW: 16.8 % — ABNORMAL HIGH (ref 11.5–15.5)
WBC: 13.6 10*3/uL — ABNORMAL HIGH (ref 4.0–10.5)
nRBC: 0 % (ref 0.0–0.2)

## 2020-07-16 LAB — RENAL FUNCTION PANEL
Albumin: 1.8 g/dL — ABNORMAL LOW (ref 3.5–5.0)
Anion gap: 11 (ref 5–15)
BUN: 83 mg/dL — ABNORMAL HIGH (ref 6–20)
CO2: 27 mmol/L (ref 22–32)
Calcium: 8.4 mg/dL — ABNORMAL LOW (ref 8.9–10.3)
Chloride: 94 mmol/L — ABNORMAL LOW (ref 98–111)
Creatinine, Ser: 5.43 mg/dL — ABNORMAL HIGH (ref 0.61–1.24)
GFR, Estimated: 12 mL/min — ABNORMAL LOW (ref >60)
Glucose, Bld: 224 mg/dL — ABNORMAL HIGH (ref 70–99)
Phosphorus: 3.6 mg/dL (ref 2.5–4.6)
Potassium: 4 mmol/L (ref 3.5–5.1)
Sodium: 132 mmol/L — ABNORMAL LOW (ref 135–145)

## 2020-07-16 LAB — VANCOMYCIN, TROUGH: Vancomycin Tr: 17 ug/mL (ref 15–20)

## 2020-07-16 NOTE — Progress Notes (Signed)
Pulmonary [place]   PULMONARY CRITICAL CARE SERVICE  PROGRESS NOTE     GARRUS ROLLEY  [id number]  DOB: 1975/12/07   DOA: 06/29/2020  Referring Physician: [name], MD  HPI: LISANDRO CURTISS is a 45 y.o. male being followed for ventilator/airway/oxygen weaning Acute on Chronic Respiratory Failure.  Patient is afebrile right now comfortable without distress has been on hemodialysis this morning was on pressure support 5/5  Medications: Reviewed on Rounds  Physical Exam:  Vitals: Temperature is 97.9 pulse 67 respiratory rate is 19 blood pressure is 157/81 saturations 100%  Ventilator Settings on pressure support 5/5  General: Comfortable at this time Neck: supple Cardiovascular: no malignant arrhythmias Respiratory: Scattered rhonchi expansion is equal Skin: no rash seen on limited exam Musculoskeletal: No gross abnormality Psychiatric:unable to assess Neurologic:no involuntary movements         Lab Data:   Basic Metabolic Panel: Recent Labs  Lab 07/10/20 0659 07/11/20 0813 07/14/20 0810 07/16/20 0626  NA 132* 132* 132* 132*  K 4.3 4.3 4.2 4.0  CL 94* 93* 94* 94*  CO2 '28 26 26 27  '$ GLUCOSE 98 110* 224* 224*  BUN 58* 87* 91* 83*  CREATININE 4.78* 6.02* 6.04* 5.43*  CALCIUM 8.8* 8.8* 8.7* 8.4*  MG 2.3  --   --   --   PHOS  --  2.7 3.2 3.6    ABG: No results for input(s): PHART, PCO2ART, PO2ART, HCO3, O2SAT in the last 168 hours.  Liver Function Tests: Recent Labs  Lab 07/11/20 0813 07/14/20 0810 07/16/20 0626  ALBUMIN 1.8* 1.9* 1.8*   No results for input(s): LIPASE, AMYLASE in the last 168 hours. No results for input(s): AMMONIA in the last 168 hours.  CBC: Recent Labs  Lab 07/11/20 0813 07/12/20 0307 07/14/20 0810 07/15/20 1329 07/16/20 0702  WBC 18.7* 18.7* 15.5* 15.8* 13.6*  HGB 7.0* 8.3* 7.0* 8.2* 7.1*  HCT 22.7* 26.0* 21.9* 26.6* 23.4*  MCV 72.8* 73.0* 73.2* 73.9* 73.8*  PLT  319 298 332 415* 371    Cardiac Enzymes: No results for input(s): CKTOTAL, CKMB, CKMBINDEX, TROPONINI in the last 168 hours.  BNP (last 3 results) No results for input(s): BNP in the last 8760 hours.  ProBNP (last 3 results) No results for input(s): PROBNP in the last 8760 hours.  Radiological Exams: No results found.  Assessment/Plan Active Problems:   Acute on chronic respiratory failure with hypoxia (HCC)   End stage renal disease on dialysis (HCC)   Lobar pneumonia, unspecified organism (HCC)   Acute metabolic encephalopathy   Chronic diastolic CHF (congestive heart failure) (HCC)   Acute on chronic respiratory failure hypoxia plan is to continue with pressure support for now.  The tracheostomy is scheduled for Friday once the tracheostomy is done I think we will be able to move ahead with weaning and I am hopeful that we should have him on T collar fairly quickly End-stage renal disease on hemodialysis we will continue to monitor closely. 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 The Patient requires high complexity decision making with multiple systems involvement.  Rounds were done with the Respiratory Therapy Director and Staff therapists and discussed with nursing  staff also.  Allyne Gee, MD [facility] Pulmonary Critical Care Medicine Sleep Medicine

## 2020-07-16 NOTE — H&P (Signed)
PREOPERATIVE H&P  Chief Complaint: Chronic respiratory failure  HPI: Jeffery Andrews is a 45 y.o. male who presents for evaluation of chronic respiratory failure.  Patient was admitted to an outside hospital on 06/22/2020 with end-stage renal disease, malignant hypertension and encephalopathy.  He was subsequently intubated on 06/24/2020.  [facility].  He was transferred intubated and on ventilator.  Attempts to wean him off of the ventilator have been unsuccessful and tracheostomy was recommended.  Patient has been nonresponsive and this has been discussed with his family.  He receives dialysis on Tuesdays Thursdays and Saturdays.  No past medical history on file.  Social History   Socioeconomic History   Marital status: Single    Spouse name: Not on file   Number of children: Not on file   Years of education: Not on file   Highest education level: Not on file  Occupational History   Not on file  Tobacco Use   Smoking status: Not on file   Smokeless tobacco: Not on file  Substance and Sexual Activity   Alcohol use: Not on file   Drug use: Not on file   Sexual activity: Not on file  Other Topics Concern   Not on file  Social History Narrative   Not on file   Social Determinants of Health   Financial Resource Strain: Not on file  Food Insecurity: Not on file  Transportation Needs: Not on file  Physical Activity: Not on file  Stress: Not on file  Social Connections: Not on file   No family history on file. Not on File Prior to Admission medications   Not on File     Positive ROS: Patient nonresponsive  All other systems have been reviewed and were otherwise negative with the exception of those mentioned in the HPI and as above.  Physical Exam: There were no vitals filed for this visit.  General: Patient intubated with a 7/2 endotracheal tube and on ventilator.  He is  nonresponsive. Nasal: Clear nasal passages Neck: No palpable adenopathy or thyroid nodules.  Trachea is palpably midline with a thin neck and easily palpable cricoid cartilage. Cardiovascular: Regular rate and rhythm, no murmur.  Respiratory: Clear to auscultation Neurologic: Patient is not responsive to conversation.   Assessment/Plan: Chronic respiratory failure having been intubated for over 3 weeks.  Plan for tracheostomy.   [name], MD 07/16/2020 6:04 PM

## 2020-07-17 ENCOUNTER — Telehealth (INDEPENDENT_AMBULATORY_CARE_PROVIDER_SITE_OTHER): Payer: Self-pay | Admitting: Otolaryngology

## 2020-07-17 ENCOUNTER — Inpatient Hospital Stay
Admission: RE | Admit: 2020-07-17 | Discharge: 2020-12-25 | Disposition: A | Discharge status: Skilled Nursing Facility | Payer: Medicare Other | Source: Other Acute Inpatient Hospital | Attending: Internal Medicine | Admitting: Internal Medicine

## 2020-07-17 ENCOUNTER — Encounter (HOSPITAL_COMMUNITY)
Admission: RE | Disposition: A | Discharge status: Nursing Home (Includes ICF) | Payer: Self-pay | Source: Other Acute Inpatient Hospital | Attending: Internal Medicine

## 2020-07-17 ENCOUNTER — Encounter (HOSPITAL_COMMUNITY): Payer: Medicare Other | Admitting: Critical Care Medicine

## 2020-07-17 ENCOUNTER — Ambulatory Visit (INDEPENDENT_AMBULATORY_CARE_PROVIDER_SITE_OTHER): Payer: Self-pay | Admitting: Otolaryngology

## 2020-07-17 ENCOUNTER — Inpatient Hospital Stay (HOSPITAL_COMMUNITY): Admission: RE | Admit: 2020-07-17 | Payer: Medicare Other | Source: Home / Self Care | Admitting: Otolaryngology

## 2020-07-17 DIAGNOSIS — T17908A Unspecified foreign body in respiratory tract, part unspecified causing other injury, initial encounter: Secondary | ICD-10-CM

## 2020-07-17 DIAGNOSIS — R0602 Shortness of breath: Secondary | ICD-10-CM

## 2020-07-17 DIAGNOSIS — K567 Ileus, unspecified: Secondary | ICD-10-CM

## 2020-07-17 DIAGNOSIS — J189 Pneumonia, unspecified organism: Secondary | ICD-10-CM

## 2020-07-17 DIAGNOSIS — D72829 Elevated white blood cell count, unspecified: Secondary | ICD-10-CM

## 2020-07-17 DIAGNOSIS — R112 Nausea with vomiting, unspecified: Secondary | ICD-10-CM

## 2020-07-17 DIAGNOSIS — J9621 Acute and chronic respiratory failure with hypoxia: Secondary | ICD-10-CM | POA: Diagnosis present

## 2020-07-17 DIAGNOSIS — J181 Lobar pneumonia, unspecified organism: Secondary | ICD-10-CM | POA: Diagnosis present

## 2020-07-17 DIAGNOSIS — R1310 Dysphagia, unspecified: Secondary | ICD-10-CM

## 2020-07-17 DIAGNOSIS — N186 End stage renal disease: Secondary | ICD-10-CM

## 2020-07-17 DIAGNOSIS — Z452 Encounter for adjustment and management of vascular access device: Secondary | ICD-10-CM

## 2020-07-17 DIAGNOSIS — R509 Fever, unspecified: Secondary | ICD-10-CM

## 2020-07-17 DIAGNOSIS — J9620 Acute and chronic respiratory failure, unspecified whether with hypoxia or hypercapnia: Secondary | ICD-10-CM

## 2020-07-17 DIAGNOSIS — J9690 Respiratory failure, unspecified, unspecified whether with hypoxia or hypercapnia: Secondary | ICD-10-CM

## 2020-07-17 DIAGNOSIS — I5032 Chronic diastolic (congestive) heart failure: Secondary | ICD-10-CM | POA: Diagnosis present

## 2020-07-17 HISTORY — PX: TRACHEOSTOMY TUBE PLACEMENT: SHX814

## 2020-07-17 LAB — TYPE AND SCREEN
ABO/RH(D): O POS
Antibody Screen: NEGATIVE
Unit division: 0

## 2020-07-17 LAB — BPAM RBC
Blood Product Expiration Date: 202208122359
ISSUE DATE / TIME: 202207140834
Unit Type and Rh: 5100

## 2020-07-17 SURGERY — CREATION, TRACHEOSTOMY
Anesthesia: General

## 2020-07-17 SURGERY — CREATION, TRACHEOSTOMY
Anesthesia: General | Site: Neck

## 2020-07-17 MED ORDER — SODIUM CHLORIDE 0.9 % IV SOLN: INTRAVENOUS | Status: DC | PRN

## 2020-07-17 MED ORDER — LIDOCAINE-EPINEPHRINE 1 %-1:100000 IJ SOLN
INTRAMUSCULAR | Status: DC | PRN
  Administered 2020-07-17: 4.5 mL via INTRADERMAL

## 2020-07-17 MED ORDER — FENTANYL CITRATE (PF) 250 MCG/5ML IJ SOLN
INTRAMUSCULAR | Status: DC | PRN
  Administered 2020-07-17: 50 ug via INTRAVENOUS

## 2020-07-17 MED ORDER — SUGAMMADEX SODIUM 200 MG/2ML IV SOLN
INTRAVENOUS | Status: DC | PRN
  Administered 2020-07-17: 200 mg via INTRAVENOUS

## 2020-07-17 MED ORDER — ONDANSETRON HCL 4 MG/2ML IJ SOLN
INTRAMUSCULAR | Status: DC | PRN
  Administered 2020-07-17: 4 mg via INTRAVENOUS

## 2020-07-17 MED ORDER — BACITRACIN ZINC 500 UNIT/GM EX OINT
TOPICAL_OINTMENT | CUTANEOUS | Status: DC | PRN
  Administered 2020-07-17: 1 via TOPICAL

## 2020-07-17 MED ORDER — 0.9 % SODIUM CHLORIDE (POUR BTL) OPTIME
TOPICAL | Status: DC | PRN
  Administered 2020-07-17: 1000 mL

## 2020-07-17 MED ORDER — BACITRACIN ZINC 500 UNIT/GM EX OINT
TOPICAL_OINTMENT | CUTANEOUS | Status: AC
  Filled 2020-07-17: qty 28.35

## 2020-07-17 MED ORDER — ROCURONIUM BROMIDE 10 MG/ML (PF) SYRINGE
PREFILLED_SYRINGE | INTRAVENOUS | Status: DC | PRN
  Administered 2020-07-17: 20 mg via INTRAVENOUS
  Administered 2020-07-17: 50 mg via INTRAVENOUS

## 2020-07-17 SURGICAL SUPPLY — 43 items
ATTRACTOMAT 16X20 MAGNETIC DRP (DRAPES) IMPLANT
BAG COUNTER SPONGE SURGICOUNT (BAG) ×2 IMPLANT
BLADE SURG 15 STRL LF DISP TIS (BLADE) ×1 IMPLANT
BLADE SURG 15 STRL SS (BLADE) ×1
CLEANER TIP ELECTROSURG 2X2 (MISCELLANEOUS) ×2 IMPLANT
COVER SURGICAL LIGHT HANDLE (MISCELLANEOUS) ×2 IMPLANT
DRAPE HALF SHEET 40X57 (DRAPES) IMPLANT
DRESSING MEPILEX FLEX 4X4 (GAUZE/BANDAGES/DRESSINGS) ×1 IMPLANT
DRSG MEPILEX BORD LITE 2X5 (GAUZE/BANDAGES/DRESSINGS) ×2 IMPLANT
DRSG MEPILEX FLEX 4X4 (GAUZE/BANDAGES/DRESSINGS) ×2
ELECT COATED BLADE 2.86 ST (ELECTRODE) ×2 IMPLANT
ELECT REM PT RETURN 9FT ADLT (ELECTROSURGICAL) ×2
ELECTRODE REM PT RTRN 9FT ADLT (ELECTROSURGICAL) ×1 IMPLANT
GAUZE 4X4 16PLY ~~LOC~~+RFID DBL (SPONGE) ×2 IMPLANT
GAUZE SPONGE 4X4 12PLY STRL LF (GAUZE/BANDAGES/DRESSINGS) ×2 IMPLANT
GEL ULTRASOUND 20GR AQUASONIC (MISCELLANEOUS) ×2 IMPLANT
GLOVE SURG MICRO LTX SZ7.5 (GLOVE) ×2 IMPLANT
GOWN STRL REUS W/ TWL LRG LVL3 (GOWN DISPOSABLE) ×1 IMPLANT
GOWN STRL REUS W/ TWL XL LVL3 (GOWN DISPOSABLE) ×1 IMPLANT
GOWN STRL REUS W/TWL LRG LVL3 (GOWN DISPOSABLE) ×1
GOWN STRL REUS W/TWL XL LVL3 (GOWN DISPOSABLE) ×1
HOLDER TRACH TUBE VELCRO 19.5 (MISCELLANEOUS) ×2 IMPLANT
KIT BASIN OR (CUSTOM PROCEDURE TRAY) ×2 IMPLANT
KIT SUCTION CATH 14FR (SUCTIONS) ×2 IMPLANT
KIT TURNOVER KIT B (KITS) ×2 IMPLANT
NEEDLE HYPO 25GX1X1/2 BEV (NEEDLE) ×2 IMPLANT
NS IRRIG 1000ML POUR BTL (IV SOLUTION) ×2 IMPLANT
PACK EENT II TURBAN DRAPE (CUSTOM PROCEDURE TRAY) ×2 IMPLANT
PAD ARMBOARD 7.5X6 YLW CONV (MISCELLANEOUS) IMPLANT
PENCIL BUTTON HOLSTER BLD 10FT (ELECTRODE) ×2 IMPLANT
SPONGE DRAIN TRACH 4X4 STRL 2S (GAUZE/BANDAGES/DRESSINGS) ×2 IMPLANT
SPONGE INTESTINAL PEANUT (DISPOSABLE) ×2 IMPLANT
SUT SILK 2 0 SH CR/8 (SUTURE) ×2 IMPLANT
SUT SILK 3 0 TIES 10X30 (SUTURE) IMPLANT
SYR 5ML LUER SLIP (SYRINGE) ×2 IMPLANT
SYR CONTROL 10ML LL (SYRINGE) ×2 IMPLANT
TOWEL GREEN STERILE (TOWEL DISPOSABLE) ×2 IMPLANT
TOWEL GREEN STERILE FF (TOWEL DISPOSABLE) ×2 IMPLANT
TUBE CONNECTING 12X1/4 (SUCTIONS) ×2 IMPLANT
TUBE TRACH  6.0 CUFF FLEX (MISCELLANEOUS) ×1
TUBE TRACH 6.0 CUFF FLEX (MISCELLANEOUS) ×1 IMPLANT
TUBE TRACH SHILEY  6 DIST  CUF (TUBING) IMPLANT
TUBE TRACH SHILEY 8 DIST CUF (TUBING) IMPLANT

## 2020-07-17 NOTE — Anesthesia Preprocedure Evaluation (Signed)
Anesthesia Evaluation  Patient identified by MRN, date of birth, ID band Patient unresponsive    Reviewed: Patient's Chart, lab work & pertinent test results, Unable to perform ROS - Chart review only  Airway Mallampati: Intubated  TM Distance: >3 FB Neck ROM: Full    Dental no notable dental hx.    Pulmonary neg pulmonary ROS,    Pulmonary exam normal breath sounds clear to auscultation       Cardiovascular hypertension, Normal cardiovascular exam Rhythm:Regular Rate:Normal  Left ventricular ejection fraction, by estimation, is 65 to 70%. The  left ventricle has normal function. The left ventricle has no regional  wall motion abnormalities. There is moderate concentric left ventricular  hypertrophy. Left ventricular  diastolic parameters are consistent with Grade II diastolic dysfunction  (pseudonormalization).  2. Right ventricular systolic function is normal. The right ventricular  size is normal.  3. Left atrial size was moderately dilated.  4. Right atrial size was mildly dilated.  5. A small pericardial effusion is present. The pericardial effusion is  circumferential. There is no evidence of cardiac tamponade.  6. The mitral valve is normal in structure. Mild to moderate mitral valve  regurgitation.  7. The aortic valve is tricuspid. Aortic valve regurgitation is trivial.  8. The inferior vena cava is normal in size with greater than 50%  respiratory variability, suggesting right atrial pressure of 3 mmHg.    Neuro/Psych negative neurological ROS  negative psych ROS   GI/Hepatic negative GI ROS, Neg liver ROS,   Endo/Other  negative endocrine ROS  Renal/GU DialysisRenal disease  negative genitourinary   Musculoskeletal negative musculoskeletal ROS (+)   Abdominal   Peds negative pediatric ROS (+)  Hematology  (+) anemia ,   Anesthesia Other Findings   Reproductive/Obstetrics negative OB ROS                              Anesthesia Physical Anesthesia Plan  ASA: 4  Anesthesia Plan: General   Post-op Pain Management:    Induction: Intravenous  PONV Risk Score and Plan: 2 and Ondansetron, Dexamethasone and Treatment may vary due to age or medical condition  Airway Management Planned: Tracheostomy and Oral ETT  Additional Equipment:   Intra-op Plan:   Post-operative Plan: Post-operative intubation/ventilation  Informed Consent: I have reviewed the patients History and Physical, chart, labs and discussed the procedure including the risks, benefits and alternatives for the proposed anesthesia with the patient or authorized representative who has indicated his/her understanding and acceptance.     Dental advisory given  Plan Discussed with: CRNA and Surgeon  Anesthesia Plan Comments:         Anesthesia Quick Evaluation

## 2020-07-17 NOTE — Transfer of Care (Signed)
Immediate Anesthesia Transfer of Care Note  Patient: Avraj L Waterson  Procedure(s) Performed: TRACHEOSTOMY (Neck)  Patient Location: ICU  Anesthesia Type:General  Level of Consciousness: sedated, unresponsive and Patient remains intubated per anesthesia plan  Airway & Oxygen Therapy: Patient remains intubated per anesthesia plan and Patient placed on Ventilator (see vital sign flow sheet for setting)  Post-op Assessment: Report given to RN and Post -op Vital signs reviewed and stable  Post vital signs: Reviewed and stable  Last Vitals:  Vitals Value Taken Time  BP    Temp    Pulse    Resp    SpO2      Last Pain: There were no vitals filed for this visit.       Complications: No notable events documented.

## 2020-07-17 NOTE — Anesthesia Postprocedure Evaluation (Signed)
Anesthesia Post Note  Patient: Jeffery Andrews  Procedure(s) Performed: TRACHEOSTOMY (Neck)     Patient location during evaluation: SICU Anesthesia Type: General Level of consciousness: sedated Pain management: pain level controlled Vital Signs Assessment: post-procedure vital signs reviewed and stable Respiratory status: patient remains intubated per anesthesia plan Cardiovascular status: stable Postop Assessment: no apparent nausea or vomiting Anesthetic complications: no   No notable events documented.  Last Vitals: There were no vitals filed for this visit.  Last Pain: There were no vitals filed for this visit.               Babe Anthis S

## 2020-07-17 NOTE — Brief Op Note (Signed)
07/17/2020  8:55 AM  PATIENT:  Jeffery Andrews  45 y.o. male  PRE-OPERATIVE DIAGNOSIS:  respiratory failure  POST-OPERATIVE DIAGNOSIS:  respiratory failure  PROCEDURE:  Procedure(s): TRACHEOSTOMY (N/A) # 6 [place]  SURGEON:  Surgeon(s) and Role:    * Rozetta Nunnery, MD - Primary  PHYSICIAN ASSISTANT:   ASSISTANTS: none   ANESTHESIA:   general  EBL:  minimal   BLOOD ADMINISTERED:none  DRAINS: none   LOCAL MEDICATIONS USED:  XYLOCAINE   SPECIMEN:  No Specimen  DISPOSITION OF SPECIMEN:  N/A  COUNTS:  YES  TOURNIQUET:  * No tourniquets in log *  DICTATION: .Other Dictation: Dictation Number [id number]  PLAN OF CARE: Discharge to home after PACU  PATIENT DISPOSITION:  PACU - hemodynamically stable.   Delay start of Pharmacological VTE agent (>24hrs) due to surgical blood loss or risk of bleeding: yes

## 2020-07-17 NOTE — H&P (Signed)
PREOPERATIVE H&P  Chief Complaint: Respiratory failure  HPI: Jeffery Andrews is a 45 y.o. male who presents for evaluation of respiratory failure for possible tracheostomy.  Patient was admitted initially to the hospital on 06/22/2020 with encephalopathy.  He also had history of end-stage renal disease, malignant hypertension and congestive heart failure.  He subsequently underwent intubation on 06/24/2020.  [facility] intubated on ventilator.  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  Attempts at weaning him expending more evident unsuccessful and tracheostomy was recommended.  He is taken to the operating room this time with tracheostomy.  He receives dialysis Tuesday Thursdays and Saturdays.  No past medical history on file.  Social History   Socioeconomic History   Marital status: Single    Spouse name: Not on file   Number of children: Not on file   Years of education: Not on file   Highest education level: Not on file  Occupational History   Not on file  Tobacco Use   Smoking status: Not on file   Smokeless tobacco: Not on file  Substance and Sexual Activity   Alcohol use: Not on file   Drug use: Not on file   Sexual activity: Not on file  Other Topics Concern   Not on file  Social History Narrative   Not on file   Social Determinants of Health   Financial Resource Strain: Not on file  Food Insecurity: Not on file  Transportation Needs: Not on file  Physical Activity: Not on file  Stress: Not on file  Social Connections: Not on file   No family history on file. Not on File Prior to Admission medications   Not on File     Positive ROS: Otherwise negative.  Nonresponsive  All other systems have been reviewed and were otherwise negative with the exception of those mentioned in the HPI and as above.  Physical Exam: There were no vitals filed for this  visit.  General: Patient is intubated and nonresponsive. Nasal: Clear nasal passages Neck: No palpable adenopathy or thyroid nodules.  Trachea is midline and easily palpable. Cardiovascular: Regular rate and rhythm, no murmur.  Respiratory: Clear to auscultation Neurologic: Patient unresponsive to conversation and intubated.   Assessment/Plan: respiratory failure Plan for Procedure(s): TRACHEOSTOMY   [name], MD 07/17/2020 8:03 AM

## 2020-07-17 NOTE — Progress Notes (Signed)
[facility]  ROUNDING NOTE   Subjective:  Patient just returned from tracheostomy placement. Not responding to voice commands. Still has contractures in bilateral upper extremities.  Objective:  Vital signs in last 24 hours:  Temperature 98.5 pulse 93 respirations 21 blood pressure 180/91   Physical Exam: General:  Critically ill-appearing  Head:  Normocephalic, atraumatic.    Eyes:  Anicteric  Neck: Tracheostomy in place  Lungs:   Clear to auscultation, vent assisted  Heart:  S1S2 no rubs  Abdomen:   Soft, nontender, bowel sounds present  Extremities:  Trace peripheral edema.  Neurologic:  Eyes open but not following commands, contractures bilateral upper extremities  Skin:  No acute skin rash  Access:  Left upper extremity AV fistula    Basic Metabolic Panel: Recent Labs  Lab 07/11/20 0813 07/14/20 0810 07/16/20 0626  NA 132* 132* 132*  K 4.3 4.2 4.0  CL 93* 94* 94*  CO2 '26 26 27  '$ GLUCOSE 110* 224* 224*  BUN 87* 91* 83*  CREATININE 6.02* 6.04* 5.43*  CALCIUM 8.8* 8.7* 8.4*  PHOS 2.7 3.2 3.6     Liver Function Tests: Recent Labs  Lab 07/11/20 0813 07/14/20 0810 07/16/20 0626  ALBUMIN 1.8* 1.9* 1.8*    No results for input(s): LIPASE, AMYLASE in the last 168 hours. No results for input(s): AMMONIA in the last 168 hours.  CBC: Recent Labs  Lab 07/11/20 0813 07/12/20 0307 07/14/20 0810 07/15/20 1329 07/16/20 0702  WBC 18.7* 18.7* 15.5* 15.8* 13.6*  HGB 7.0* 8.3* 7.0* 8.2* 7.1*  HCT 22.7* 26.0* 21.9* 26.6* 23.4*  MCV 72.8* 73.0* 73.2* 73.9* 73.8*  PLT 319 298 332 415* 371     Cardiac Enzymes: No results for input(s): CKTOTAL, CKMB, CKMBINDEX, TROPONINI in the last 168 hours.  BNP: Invalid input(s): POCBNP  CBG: No results for input(s): GLUCAP in the last 168 hours.  Microbiology: Results for orders placed or performed during the hospital encounter of 06/29/20  Culture, Respiratory w Gram Stain     Status: None    Collection Time: 06/30/20  4:00 PM   Specimen: Tracheal Aspirate; Respiratory  Result Value Ref Range Status   Specimen Description TRACHEAL ASPIRATE  Final   Special Requests NONE  Final   Gram Stain   Final    FEW WBC PRESENT,BOTH PMN AND MONONUCLEAR MODERATE GRAM NEGATIVE COCCOBACILLI RARE GRAM POSITIVE COCCI IN PAIRS FEW GRAM POSITIVE RODS Performed at [facility], [place] [name] [place], [place] [address]    Culture   Final    ABUNDANT HAEMOPHILUS INFLUENZAE BETA LACTAMASE NEGATIVE FEW STAPHYLOCOCCUS AUREUS    Report Status 07/04/2020 FINAL  Final   Organism ID, Bacteria STAPHYLOCOCCUS AUREUS  Final      Susceptibility   Staphylococcus aureus - MIC*    CIPROFLOXACIN <=0.5 SENSITIVE Sensitive     ERYTHROMYCIN >=8 RESISTANT Resistant     GENTAMICIN <=0.5 SENSITIVE Sensitive     OXACILLIN <=0.25 SENSITIVE Sensitive     TETRACYCLINE <=1 SENSITIVE Sensitive     VANCOMYCIN <=0.5 SENSITIVE Sensitive     TRIMETH/SULFA <=10 SENSITIVE Sensitive     CLINDAMYCIN <=0.25 SENSITIVE Sensitive     RIFAMPIN <=0.5 SENSITIVE Sensitive     Inducible Clindamycin NEGATIVE Sensitive     * FEW STAPHYLOCOCCUS AUREUS  Culture, blood (routine x 2)     Status: Abnormal   Collection Time: 07/08/20  7:12 PM   Specimen: BLOOD  Result Value Ref Range Status   Specimen Description BLOOD SITE  NOT SPECIFIED  Final   Special Requests   Final    BOTTLES DRAWN AEROBIC AND ANAEROBIC Blood Culture adequate volume   Culture  Setup Time   Final    GRAM POSITIVE COCCI IN CLUSTERS AEROBIC BOTTLE ONLY CRITICAL VALUE NOTED.  VALUE IS CONSISTENT WITH PREVIOUSLY REPORTED AND CALLED VALUE.    Culture (A)  Final    STAPHYLOCOCCUS HOMINIS THE SIGNIFICANCE OF ISOLATING THIS ORGANISM FROM A SINGLE SET OF BLOOD CULTURES WHEN MULTIPLE SETS ARE DRAWN IS UNCERTAIN. PLEASE NOTIFY THE MICROBIOLOGY DEPARTMENT WITHIN ONE WEEK IF SPECIATION AND SENSITIVITIES ARE REQUIRED. Performed at [facility], [place]  [address], [place], [place] [address]    Report Status 07/13/2020 FINAL  Final  Culture, blood (routine x 2)     Status: Abnormal   Collection Time: 07/08/20  7:12 PM   Specimen: BLOOD  Result Value Ref Range Status   Specimen Description BLOOD SITE NOT SPECIFIED  Final   Special Requests   Final    BOTTLES DRAWN AEROBIC AND ANAEROBIC Blood Culture results may not be optimal due to an inadequate volume of blood received in culture bottles   Culture  Setup Time   Final    GRAM POSITIVE COCCI IN CLUSTERS AEROBIC BOTTLE ONLY Organism ID to follow CRITICAL RESULT CALLED TO, READ BACK BY AND VERIFIED WITH: Mirian Mo RN, 0132 07/10/20 D.VANHOOK    Culture (A)  Final    STAPHYLOCOCCUS EPIDERMIDIS THE SIGNIFICANCE OF ISOLATING THIS ORGANISM FROM A SINGLE SET OF BLOOD CULTURES WHEN MULTIPLE SETS ARE DRAWN IS UNCERTAIN. PLEASE NOTIFY THE MICROBIOLOGY DEPARTMENT WITHIN ONE WEEK IF SPECIATION AND SENSITIVITIES ARE REQUIRED. Performed at [facility], [place] [address] [place], [place] [address]    Report Status 07/10/2020 FINAL  Final  Blood Culture ID Panel (Reflexed)     Status: Abnormal   Collection Time: 07/08/20  7:12 PM  Result Value Ref Range Status   Enterococcus faecalis NOT DETECTED NOT DETECTED Final   Enterococcus Faecium NOT DETECTED NOT DETECTED Final   Listeria monocytogenes NOT DETECTED NOT DETECTED Final   Staphylococcus species DETECTED (A) NOT DETECTED Final    Comment: CRITICAL RESULT CALLED TO, READ BACK BY AND VERIFIED WITH: Mirian Mo RN, 0132 07/10/20 D.VANHOOK    Staphylococcus aureus (BCID) NOT DETECTED NOT DETECTED Final   Staphylococcus epidermidis DETECTED (A) NOT DETECTED Final    Comment: Methicillin (oxacillin) resistant coagulase negative staphylococcus. Possible blood culture contaminant (unless isolated from more than one blood culture draw or clinical case suggests pathogenicity). No antibiotic treatment is indicated for blood  culture contaminants. CRITICAL  RESULT CALLED TO, READ BACK BY AND VERIFIED WITH: Mirian Mo RN, 0132 07/10/20 D.VANHOOK    Staphylococcus lugdunensis NOT DETECTED NOT DETECTED Final   Streptococcus species NOT DETECTED NOT DETECTED Final   Streptococcus agalactiae NOT DETECTED NOT DETECTED Final   Streptococcus pneumoniae NOT DETECTED NOT DETECTED Final   Streptococcus pyogenes NOT DETECTED NOT DETECTED Final   A.calcoaceticus-baumannii NOT DETECTED NOT DETECTED Final   Bacteroides fragilis NOT DETECTED NOT DETECTED Final   Enterobacterales NOT DETECTED NOT DETECTED Final   Enterobacter cloacae complex NOT DETECTED NOT DETECTED Final   Escherichia coli NOT DETECTED NOT DETECTED Final   Klebsiella aerogenes NOT DETECTED NOT DETECTED Final   Klebsiella oxytoca NOT DETECTED NOT DETECTED Final   Klebsiella pneumoniae NOT DETECTED NOT DETECTED Final   Proteus species NOT DETECTED NOT DETECTED Final   Salmonella species NOT DETECTED NOT DETECTED Final  Serratia marcescens NOT DETECTED NOT DETECTED Final   Haemophilus influenzae NOT DETECTED NOT DETECTED Final   Neisseria meningitidis NOT DETECTED NOT DETECTED Final   Pseudomonas aeruginosa NOT DETECTED NOT DETECTED Final   Stenotrophomonas maltophilia NOT DETECTED NOT DETECTED Final   Candida albicans NOT DETECTED NOT DETECTED Final   Candida auris NOT DETECTED NOT DETECTED Final   Candida glabrata NOT DETECTED NOT DETECTED Final   Candida krusei NOT DETECTED NOT DETECTED Final   Candida parapsilosis NOT DETECTED NOT DETECTED Final   Candida tropicalis NOT DETECTED NOT DETECTED Final   Cryptococcus neoformans/gattii NOT DETECTED NOT DETECTED Final   Methicillin resistance mecA/C DETECTED (A) NOT DETECTED Final    Comment: Performed at [facility], [place] [address] [place], [place] [address]    Coagulation Studies: No results for input(s): LABPROT, INR in the last 72 hours.  Urinalysis: No results for input(s): COLORURINE, LABSPEC, PHURINE, GLUCOSEU, HGBUR,  BILIRUBINUR, KETONESUR, PROTEINUR, UROBILINOGEN, NITRITE, LEUKOCYTESUR in the last 72 hours.  Invalid input(s): APPERANCEUR    Imaging: No results found.   Medications:       Assessment/ Plan:  45 y.o. male with a PMHx of ESRD on HD, posterior reversible encephalopathy syndrome, diabetes mellitus type 2, chronic systolic heart failure, anemia of chronic kidney disease, secondary hyperparathyroidism, malignant hypertension, acute respiratory failure, who was admitted to [facility] on 06/29/2020 for ongoing treatment of acute respiratory failure, posterior reversible encephalopathy syndrome, and end-stage renal disease.   1.  ESRD on HD.  Patient completed dialysis early this AM.  Next dialysis for tomorrow.  2.  Anemia of chronic kidney disease.  Hemoglobin currently 7.1.  Just received blood transfusion as well.  3.  Secondary hyperparathyroidism.  Phosphorus currently 3.6 and within target.  4.  Acute respiratory failure.  Tracheostomy placed 07/17/2020.  Breathing comfortably but still on the vent.  5.  Hypertension.  Blood pressure continues to be labile and currently 180/91 continue amlodipine, clonidine, dialysis, labetalol, losartan.  6.  Bacteremia.  Gram-positive cocci in clusters noted previously. Line management and antibiotic management per hospitalist.    LOS: 0 Jeffery Andrews 7/15/202210:28 AM

## 2020-07-17 NOTE — OR Nursing (Signed)
Patient's left ear has been put under positional pressure due to the flexing of the patient's neck to the left.  MD states that pt's head has been in that position for one month.  Left ear appears to be macerated and weeping.  MD applied bacitracin ointment and mepilex at beginning of case.  Will need further assessment and treatment to prevent further damage.

## 2020-07-17 NOTE — Telephone Encounter (Signed)
Called Jeffery Andrews following completion of the tracheostomy on Jeffery Andrews.  Everything went well with this.  He asked about Jeffery Andrews being able to speak and reviewed with him that he will not be able to speak until he is off the ventilator. He should be fine seeing him this afternoon.

## 2020-07-17 NOTE — Interval H&P Note (Signed)
History and Physical Interval Note:  07/17/2020 8:07 AM  Jeffery Andrews  has presented today for surgery, with the diagnosis of respiratory failure.  The various methods of treatment have been discussed with the patient and family. After consideration of risks, benefits and other options for treatment, the patient has consented to  Procedure(s): TRACHEOSTOMY (N/A) as a surgical intervention.  The patient's history has been reviewed, patient examined, no change in status, stable for surgery.  I have reviewed the patient's chart and labs.  Questions were answered to the patient's satisfaction.     [name]

## 2020-07-18 LAB — CBC
HCT: 27.6 % — ABNORMAL LOW (ref 39.0–52.0)
Hemoglobin: 8.7 g/dL — ABNORMAL LOW (ref 13.0–17.0)
MCH: 23.5 pg — ABNORMAL LOW (ref 26.0–34.0)
MCHC: 31.5 g/dL (ref 30.0–36.0)
MCV: 74.4 fL — ABNORMAL LOW (ref 80.0–100.0)
Platelets: 395 10*3/uL (ref 150–400)
RBC: 3.71 MIL/uL — ABNORMAL LOW (ref 4.22–5.81)
RDW: 17.8 % — ABNORMAL HIGH (ref 11.5–15.5)
WBC: 13.9 10*3/uL — ABNORMAL HIGH (ref 4.0–10.5)
nRBC: 0 % (ref 0.0–0.2)

## 2020-07-18 LAB — RENAL FUNCTION PANEL
Albumin: 1.9 g/dL — ABNORMAL LOW (ref 3.5–5.0)
Anion gap: 11 (ref 5–15)
BUN: 79 mg/dL — ABNORMAL HIGH (ref 6–20)
CO2: 26 mmol/L (ref 22–32)
Calcium: 8.6 mg/dL — ABNORMAL LOW (ref 8.9–10.3)
Chloride: 95 mmol/L — ABNORMAL LOW (ref 98–111)
Creatinine, Ser: 5.64 mg/dL — ABNORMAL HIGH (ref 0.61–1.24)
GFR, Estimated: 12 mL/min — ABNORMAL LOW (ref >60)
Glucose, Bld: 148 mg/dL — ABNORMAL HIGH (ref 70–99)
Phosphorus: 4.4 mg/dL (ref 2.5–4.6)
Potassium: 4.5 mmol/L (ref 3.5–5.1)
Sodium: 132 mmol/L — ABNORMAL LOW (ref 135–145)

## 2020-07-20 ENCOUNTER — Encounter (HOSPITAL_COMMUNITY): Payer: Self-pay | Admitting: Otolaryngology

## 2020-07-20 DIAGNOSIS — J9621 Acute and chronic respiratory failure with hypoxia: Secondary | ICD-10-CM | POA: Diagnosis not present

## 2020-07-20 DIAGNOSIS — J181 Lobar pneumonia, unspecified organism: Secondary | ICD-10-CM

## 2020-07-20 DIAGNOSIS — I5032 Chronic diastolic (congestive) heart failure: Secondary | ICD-10-CM | POA: Diagnosis not present

## 2020-07-20 DIAGNOSIS — N186 End stage renal disease: Secondary | ICD-10-CM | POA: Diagnosis not present

## 2020-07-20 DIAGNOSIS — Z992 Dependence on renal dialysis: Secondary | ICD-10-CM

## 2020-07-20 NOTE — Progress Notes (Signed)
[facility]  PROGRESS NOTE     Jeffery Andrews  [id number]  DOB: 1975-09-29   DOA: 07/17/2020  Referring Physician: [name], MD  HPI: Jeffery Andrews is a 45 y.o. male being followed for ventilator/airway/oxygen weaning Acute on Chronic Respiratory Failure.  Patient had tracheostomy done Friday patient should be ready today for weaning so we should be able to advance to T collar.  Right now is on pressure support 5/5  Medications: Reviewed on Rounds  Physical Exam:  Vitals: Temperature 99.0 pulse 83 respiratory is 13 blood pressure is 190/96 saturations 100%  Ventilator Settings on pressure support with a pressure of 5/5  General: Comfortable at this time Neck: supple Cardiovascular: no malignant arrhythmias Respiratory: No rhonchi very coarse breath sound Skin: no rash seen on limited exam Musculoskeletal: No gross abnormality Psychiatric:unable to assess Neurologic:no involuntary movements         Lab Data:   Basic Metabolic Panel: Recent Labs  Lab 07/14/20 0810 07/16/20 0626 07/18/20 1015  NA 132* 132* 132*  K 4.2 4.0 4.5  CL 94* 94* 95*  CO2 '26 27 26  '$ GLUCOSE 224* 224* 148*  BUN 91* 83* 79*  CREATININE 6.04* 5.43* 5.64*  CALCIUM 8.7* 8.4* 8.6*  PHOS 3.2 3.6 4.4    ABG: No results for input(s): PHART, PCO2ART, PO2ART, HCO3, O2SAT in the last 168 hours.  Liver Function Tests: Recent Labs  Lab 07/14/20 0810 07/16/20 0626 07/18/20 1015  ALBUMIN 1.9* 1.8* 1.9*   No results for input(s): LIPASE, AMYLASE in the last 168 hours. No results for input(s): AMMONIA in the last 168 hours.  CBC: Recent Labs  Lab 07/14/20 0810 07/15/20 1329 07/16/20 0702 07/18/20 1015  WBC 15.5* 15.8* 13.6* 13.9*  HGB 7.0* 8.2* 7.1* 8.7*  HCT 21.9* 26.6* 23.4* 27.6*  MCV 73.2* 73.9* 73.8* 74.4*  PLT 332 415* 371 395    Cardiac Enzymes: No results for input(s):  CKTOTAL, CKMB, CKMBINDEX, TROPONINI in the last 168 hours.  BNP (last 3 results) No results for input(s): BNP in the last 8760 hours.  ProBNP (last 3 results) No results for input(s): PROBNP in the last 8760 hours.  Radiological Exams: No results found.  Assessment/Plan Active Problems:   Acute on chronic respiratory failure with hypoxia (HCC)   End stage renal disease on dialysis (HCC)   Lobar pneumonia, unspecified organism (HCC)   Chronic diastolic CHF (congestive heart failure) (HCC)   Acute on chronic respiratory failure hypoxia we will continue with the weaning on pressure support 5/5.  Titrate oxygen as tolerated continue pulmonary toilet End-stage renal failure on hemodialysis we will continue to monitor along closely. Lobar pneumonia treated we will continue with supportive care. Chronic diastolic heart failure at baseline   I have personally seen and evaluated the patient, evaluated laboratory and imaging results, formulated the assessment and plan and placed orders. The Patient requires high complexity decision making with multiple systems involvement.  Rounds were done with the Respiratory Therapy Director and Staff therapists and discussed with nursing staff also.  Allyne Gee, MD [facility] Pulmonary Critical Care Medicine Sleep Medicine

## 2020-07-20 NOTE — Progress Notes (Signed)
[facility]  ROUNDING NOTE   Subjective:  Pt seen and evaluated at bedside.  Continues to have labile BP.  Trach site appears to be healing.   Objective:  Vital signs in last 24 hours:  Temperature 99 pulse 83 respirations 13 blood pressure 139/71-190/96   Physical Exam: General:  Critically ill-appearing  Head:  Normocephalic, atraumatic.    Eyes:  Anicteric  Neck:  Tracheostomy in place  Lungs:   Clear to auscultation, vent assisted  Heart:  S1S2 no rubs  Abdomen:   Soft, nontender, bowel sounds present  Extremities:  Trace peripheral edema.  Neurologic:  Contractures b/l UE's  Skin:  No acute skin rash  Access:  Left upper extremity AV fistula    Basic Metabolic Panel: Recent Labs  Lab 07/14/20 0810 07/16/20 0626 07/18/20 1015  NA 132* 132* 132*  K 4.2 4.0 4.5  CL 94* 94* 95*  CO2 '26 27 26  '$ GLUCOSE 224* 224* 148*  BUN 91* 83* 79*  CREATININE 6.04* 5.43* 5.64*  CALCIUM 8.7* 8.4* 8.6*  PHOS 3.2 3.6 4.4     Liver Function Tests: Recent Labs  Lab 07/14/20 0810 07/16/20 0626 07/18/20 1015  ALBUMIN 1.9* 1.8* 1.9*    No results for input(s): LIPASE, AMYLASE in the last 168 hours. No results for input(s): AMMONIA in the last 168 hours.  CBC: Recent Labs  Lab 07/14/20 0810 07/15/20 1329 07/16/20 0702 07/18/20 1015  WBC 15.5* 15.8* 13.6* 13.9*  HGB 7.0* 8.2* 7.1* 8.7*  HCT 21.9* 26.6* 23.4* 27.6*  MCV 73.2* 73.9* 73.8* 74.4*  PLT 332 415* 371 395     Cardiac Enzymes: No results for input(s): CKTOTAL, CKMB, CKMBINDEX, TROPONINI in the last 168 hours.  BNP: Invalid input(s): POCBNP  CBG: No results for input(s): GLUCAP in the last 168 hours.  Microbiology: Results for orders placed or performed during the hospital encounter of 06/29/20  Culture, Respiratory w Gram Stain     Status: None   Collection Time: 06/30/20  4:00 PM   Specimen: Tracheal Aspirate; Respiratory  Result Value Ref Range Status   Specimen Description  TRACHEAL ASPIRATE  Final   Special Requests NONE  Final   Gram Stain   Final    FEW WBC PRESENT,BOTH PMN AND MONONUCLEAR MODERATE GRAM NEGATIVE COCCOBACILLI RARE GRAM POSITIVE COCCI IN PAIRS FEW GRAM POSITIVE RODS Performed at [facility], [place] [address], [place], [place] [address]    Culture   Final    ABUNDANT HAEMOPHILUS INFLUENZAE BETA LACTAMASE NEGATIVE FEW STAPHYLOCOCCUS AUREUS    Report Status 07/04/2020 FINAL  Final   Organism ID, Bacteria STAPHYLOCOCCUS AUREUS  Final      Susceptibility   Staphylococcus aureus - MIC*    CIPROFLOXACIN <=0.5 SENSITIVE Sensitive     ERYTHROMYCIN >=8 RESISTANT Resistant     GENTAMICIN <=0.5 SENSITIVE Sensitive     OXACILLIN <=0.25 SENSITIVE Sensitive     TETRACYCLINE <=1 SENSITIVE Sensitive     VANCOMYCIN <=0.5 SENSITIVE Sensitive     TRIMETH/SULFA <=10 SENSITIVE Sensitive     CLINDAMYCIN <=0.25 SENSITIVE Sensitive     RIFAMPIN <=0.5 SENSITIVE Sensitive     Inducible Clindamycin NEGATIVE Sensitive     * FEW STAPHYLOCOCCUS AUREUS  Culture, blood (routine x 2)     Status: Abnormal   Collection Time: 07/08/20  7:12 PM   Specimen: BLOOD  Result Value Ref Range Status   Specimen Description BLOOD SITE NOT SPECIFIED  Final   Special Requests   Final  BOTTLES DRAWN AEROBIC AND ANAEROBIC Blood Culture adequate volume   Culture  Setup Time   Final    GRAM POSITIVE COCCI IN CLUSTERS AEROBIC BOTTLE ONLY CRITICAL VALUE NOTED.  VALUE IS CONSISTENT WITH PREVIOUSLY REPORTED AND CALLED VALUE.    Culture (A)  Final    STAPHYLOCOCCUS HOMINIS THE SIGNIFICANCE OF ISOLATING THIS ORGANISM FROM A SINGLE SET OF BLOOD CULTURES WHEN MULTIPLE SETS ARE DRAWN IS UNCERTAIN. PLEASE NOTIFY THE MICROBIOLOGY DEPARTMENT WITHIN ONE WEEK IF SPECIATION AND SENSITIVITIES ARE REQUIRED. Performed at [facility], [place] [address], [place], [place] [address]    Report Status 07/13/2020 FINAL  Final  Culture, blood (routine x 2)     Status: Abnormal    Collection Time: 07/08/20  7:12 PM   Specimen: BLOOD  Result Value Ref Range Status   Specimen Description BLOOD SITE NOT SPECIFIED  Final   Special Requests   Final    BOTTLES DRAWN AEROBIC AND ANAEROBIC Blood Culture results may not be optimal due to an inadequate volume of blood received in culture bottles   Culture  Setup Time   Final    GRAM POSITIVE COCCI IN CLUSTERS AEROBIC BOTTLE ONLY Organism ID to follow CRITICAL RESULT CALLED TO, READ BACK BY AND VERIFIED WITH: Mirian Mo RN, 0132 07/10/20 D.VANHOOK    Culture (A)  Final    STAPHYLOCOCCUS EPIDERMIDIS THE SIGNIFICANCE OF ISOLATING THIS ORGANISM FROM A SINGLE SET OF BLOOD CULTURES WHEN MULTIPLE SETS ARE DRAWN IS UNCERTAIN. PLEASE NOTIFY THE MICROBIOLOGY DEPARTMENT WITHIN ONE WEEK IF SPECIATION AND SENSITIVITIES ARE REQUIRED. Performed at [facility], [place] [address], [place], [place] [address]    Report Status 07/10/2020 FINAL  Final  Blood Culture ID Panel (Reflexed)     Status: Abnormal   Collection Time: 07/08/20  7:12 PM  Result Value Ref Range Status   Enterococcus faecalis NOT DETECTED NOT DETECTED Final   Enterococcus Faecium NOT DETECTED NOT DETECTED Final   Listeria monocytogenes NOT DETECTED NOT DETECTED Final   Staphylococcus species DETECTED (A) NOT DETECTED Final    Comment: CRITICAL RESULT CALLED TO, READ BACK BY AND VERIFIED WITH: Mirian Mo RN, 0132 07/10/20 D.VANHOOK    Staphylococcus aureus (BCID) NOT DETECTED NOT DETECTED Final   Staphylococcus epidermidis DETECTED (A) NOT DETECTED Final    Comment: Methicillin (oxacillin) resistant coagulase negative staphylococcus. Possible blood culture contaminant (unless isolated from more than one blood culture draw or clinical case suggests pathogenicity). No antibiotic treatment is indicated for blood  culture contaminants. CRITICAL RESULT CALLED TO, READ BACK BY AND VERIFIED WITH: Mirian Mo RN, 0132 07/10/20 D.VANHOOK    Staphylococcus lugdunensis NOT DETECTED  NOT DETECTED Final   Streptococcus species NOT DETECTED NOT DETECTED Final   Streptococcus agalactiae NOT DETECTED NOT DETECTED Final   Streptococcus pneumoniae NOT DETECTED NOT DETECTED Final   Streptococcus pyogenes NOT DETECTED NOT DETECTED Final   A.calcoaceticus-baumannii NOT DETECTED NOT DETECTED Final   Bacteroides fragilis NOT DETECTED NOT DETECTED Final   Enterobacterales NOT DETECTED NOT DETECTED Final   Enterobacter cloacae complex NOT DETECTED NOT DETECTED Final   Escherichia coli NOT DETECTED NOT DETECTED Final   Klebsiella aerogenes NOT DETECTED NOT DETECTED Final   Klebsiella oxytoca NOT DETECTED NOT DETECTED Final   Klebsiella pneumoniae NOT DETECTED NOT DETECTED Final   Proteus species NOT DETECTED NOT DETECTED Final   Salmonella species NOT DETECTED NOT DETECTED Final   Serratia marcescens NOT DETECTED NOT DETECTED Final   Haemophilus influenzae NOT DETECTED NOT  DETECTED Final   Neisseria meningitidis NOT DETECTED NOT DETECTED Final   Pseudomonas aeruginosa NOT DETECTED NOT DETECTED Final   Stenotrophomonas maltophilia NOT DETECTED NOT DETECTED Final   Candida albicans NOT DETECTED NOT DETECTED Final   Candida auris NOT DETECTED NOT DETECTED Final   Candida glabrata NOT DETECTED NOT DETECTED Final   Candida krusei NOT DETECTED NOT DETECTED Final   Candida parapsilosis NOT DETECTED NOT DETECTED Final   Candida tropicalis NOT DETECTED NOT DETECTED Final   Cryptococcus neoformans/gattii NOT DETECTED NOT DETECTED Final   Methicillin resistance mecA/C DETECTED (A) NOT DETECTED Final    Comment: Performed at [place] [address] [place], [place] [address]    Coagulation Studies: No results for input(s): LABPROT, INR in the last 72 hours.  Urinalysis: No results for input(s): COLORURINE, LABSPEC, PHURINE, GLUCOSEU, HGBUR, BILIRUBINUR, KETONESUR, PROTEINUR, UROBILINOGEN, NITRITE, LEUKOCYTESUR in the last 72 hours.  Invalid input(s): APPERANCEUR     Imaging: No results found.   Medications:       Assessment/ Plan:  45 y.o. male with a PMHx of ESRD on HD, posterior reversible encephalopathy syndrome, diabetes mellitus type 2, chronic systolic heart failure, anemia of chronic kidney disease, secondary hyperparathyroidism, malignant hypertension, acute respiratory failure, who was admitted to [facility] on 06/29/2020 for ongoing treatment of acute respiratory failure, posterior reversible encephalopathy syndrome, and end-stage renal disease.   1.  ESRD on HD.  Pt seen at bedside, continue HD on TTS schedule.   2.  Anemia of chronic kidney disease.  Hgb up to 8.7 post transfusion.   3.  Secondary hyperparathyroidism.  Phosphorus remains at target at 4.4, will monitor.   4.  Acute respiratory failure.  Tracheostomy placed 07/17/2020.  Healing well.   5.  Hypertension.  Blood pressure continues to be labile and currently 190/96 but has been as low as 139/71 over past 24 hours.  Continue amlodipine, clonidine, dialysis, labetalol, losartan.  6.  Bacteremia.  Gram-positive cocci in clusters noted previously. Line management and antibiotic management per hospitalist.    LOS: 0 Leaann Nevils 7/18/20228:10 AM

## 2020-07-21 ENCOUNTER — Other Ambulatory Visit (HOSPITAL_COMMUNITY): Payer: Medicare Other

## 2020-07-21 DIAGNOSIS — I5032 Chronic diastolic (congestive) heart failure: Secondary | ICD-10-CM | POA: Diagnosis not present

## 2020-07-21 DIAGNOSIS — J181 Lobar pneumonia, unspecified organism: Secondary | ICD-10-CM | POA: Diagnosis not present

## 2020-07-21 DIAGNOSIS — N186 End stage renal disease: Secondary | ICD-10-CM | POA: Diagnosis not present

## 2020-07-21 DIAGNOSIS — J9621 Acute and chronic respiratory failure with hypoxia: Secondary | ICD-10-CM | POA: Diagnosis not present

## 2020-07-21 LAB — BLOOD GAS, ARTERIAL
Acid-Base Excess: 1 mmol/L (ref 0.0–2.0)
Bicarbonate: 24.8 mmol/L (ref 20.0–28.0)
FIO2: 28
O2 Saturation: 98.4 %
Patient temperature: 37
pCO2 arterial: 37.4 mmHg (ref 32.0–48.0)
pH, Arterial: 7.437 (ref 7.350–7.450)
pO2, Arterial: 115 mmHg — ABNORMAL HIGH (ref 83.0–108.0)

## 2020-07-21 LAB — RENAL FUNCTION PANEL
Albumin: 1.8 g/dL — ABNORMAL LOW (ref 3.5–5.0)
Anion gap: 7 (ref 5–15)
BUN: 100 mg/dL — ABNORMAL HIGH (ref 6–20)
CO2: 28 mmol/L (ref 22–32)
Calcium: 8.5 mg/dL — ABNORMAL LOW (ref 8.9–10.3)
Chloride: 93 mmol/L — ABNORMAL LOW (ref 98–111)
Creatinine, Ser: 5.7 mg/dL — ABNORMAL HIGH (ref 0.61–1.24)
GFR, Estimated: 12 mL/min — ABNORMAL LOW (ref >60)
Glucose, Bld: 166 mg/dL — ABNORMAL HIGH (ref 70–99)
Phosphorus: 4.2 mg/dL (ref 2.5–4.6)
Potassium: 4.9 mmol/L (ref 3.5–5.1)
Sodium: 128 mmol/L — ABNORMAL LOW (ref 135–145)

## 2020-07-21 LAB — CBC
HCT: 27.7 % — ABNORMAL LOW (ref 39.0–52.0)
Hemoglobin: 8.6 g/dL — ABNORMAL LOW (ref 13.0–17.0)
MCH: 23.2 pg — ABNORMAL LOW (ref 26.0–34.0)
MCHC: 31 g/dL (ref 30.0–36.0)
MCV: 74.7 fL — ABNORMAL LOW (ref 80.0–100.0)
Platelets: 421 10*3/uL — ABNORMAL HIGH (ref 150–400)
RBC: 3.71 MIL/uL — ABNORMAL LOW (ref 4.22–5.81)
RDW: 17.5 % — ABNORMAL HIGH (ref 11.5–15.5)
WBC: 16.3 10*3/uL — ABNORMAL HIGH (ref 4.0–10.5)
nRBC: 0 % (ref 0.0–0.2)

## 2020-07-21 NOTE — Op Note (Signed)
[name], [name] MEDICAL RECORD NO: [id number] ACCOUNT NO: [id number] DATE OF BIRTH: 02/12/1975 FACILITY: [facility] LOCATION: [facility] PHYSICIAN: [name]. [name], MD  Operative Report   DATE OF PROCEDURE: 07/17/2020  PREOPERATIVE DIAGNOSIS:  Respiratory failure.  POSTOPERATIVE DIAGNOSIS:  Respiratory failure.  OPERATION PERFORMED:  Tracheostomy with a #6 Shiley cuffed tracheostomy tube.  SURGEON:  [name], MD  ANESTHESIA:  General endotracheal.  ESTIMATED BLOOD LOSS:  Minimal.  COMPLICATIONS:  None.  BRIEF CLINICAL NOTE:  The patient is a 45 year old gentleman who has a long history of hypertension as well as congestive heart failure, chronic renal failure and on dialysis.  The patient was admitted to an outside hospital with acute encephalopathy and  not responsive.  He was subsequently intubated about 4 weeks ago and was transferred to [facility], intubated on ventilator on 06/29/2020.  He was receiving long-term care, but attempts to extubate him have been unsuccessful and a  tracheostomy was recommended after 4 weeks of intubation.  He is taken to the operating room at this time for tracheostomy.  DESCRIPTION OF PROCEDURE:  The patient was brought straight down from [facility] on ventilator, intubated.  He underwent anesthesia.  The neck was marked out, trachea midline and cricoid cartilage and proposed incision site was marked and  injected with 4 mL of Xylocaine with epinephrine for hemostasis.  Of note, the patient had a chronic sore on the left ear where he tilts his head chronically to the left.  This was cleaned and applied mupirocin ointment and a dressing to the left ear.   Vertical incision was made just below the cricoid cartilage.  Dissection was carried down through the subcutaneous tissue.  Strap muscles were divided and retracted laterally in the midline.  Thyroid isthmus was identified and was divided with cautery.    The first three tracheal rings were exposed and a horizontal tracheostomy was made between the second and third tracheal rings.  The endotracheal tube was removed and a #6 Shiley cuffed tube was inserted without difficulty.  The patient was ventilated  well and this was subsequently secured to the neck with 2-0 silk sutures x4 and a Velcro trach collar around the neck.  The patient's pulmonary status was stable and was transferred back to [facility] D: 07/17/2020 9:00:51 am T: 07/17/2020 10:18:00 am  JOB: [id number]

## 2020-07-21 NOTE — Progress Notes (Signed)
[name]  PROGRESS NOTE     Jeffery Andrews  [id number]  DOB: Aug 08, 1975   DOA: 07/17/2020  Referring Physician: [name], MD  HPI: Jeffery Andrews is a 45 y.o. male being followed for ventilator/airway/oxygen weaning Acute on Chronic Respiratory Failure.  He is doing well from a respiratory perspective has been taken off of the ventilator and has done fine overnight without any desaturations or issues.  Respiratory therapy still observes periods of apnea which I believe are being neurologically mediated has not had any desaturations however.  Medications: Reviewed on Rounds  Physical Exam:  Vitals: Temperature is 97.7 pulse 84 respiratory 14 blood pressure is 144/71 saturations 97%  Ventilator Settings off the ventilator on T collar  General: Comfortable at this time Neck: supple Cardiovascular: no malignant arrhythmias Respiratory: No rhonchi very coarse breath sounds Skin: no rash seen on limited exam Musculoskeletal: No gross abnormality Psychiatric:unable to assess Neurologic:no involuntary movements         Lab Data:   Basic Metabolic Panel: Recent Labs  Lab 07/16/20 0626 07/18/20 1015 07/21/20 0815  NA 132* 132* 128*  K 4.0 4.5 4.9  CL 94* 95* 93*  CO2 '27 26 28  '$ GLUCOSE 224* 148* 166*  BUN 83* 79* 100*  CREATININE 5.43* 5.64* 5.70*  CALCIUM 8.4* 8.6* 8.5*  PHOS 3.6 4.4 4.2    ABG: No results for input(s): PHART, PCO2ART, PO2ART, HCO3, O2SAT in the last 168 hours.  Liver Function Tests: Recent Labs  Lab 07/16/20 0626 07/18/20 1015 07/21/20 0815  ALBUMIN 1.8* 1.9* 1.8*   No results for input(s): LIPASE, AMYLASE in the last 168 hours. No results for input(s): AMMONIA in the last 168 hours.  CBC: Recent Labs  Lab 07/15/20 1329 07/16/20 0702 07/18/20 1015 07/21/20 0815  WBC 15.8* 13.6* 13.9* 16.3*  HGB 8.2* 7.1* 8.7* 8.6*  HCT 26.6* 23.4*  27.6* 27.7*  MCV 73.9* 73.8* 74.4* 74.7*  PLT 415* 371 395 421*    Cardiac Enzymes: No results for input(s): CKTOTAL, CKMB, CKMBINDEX, TROPONINI in the last 168 hours.  BNP (last 3 results) No results for input(s): BNP in the last 8760 hours.  ProBNP (last 3 results) No results for input(s): PROBNP in the last 8760 hours.  Radiological Exams: CT ABDOMEN WO CONTRAST  Result Date: 07/21/2020 CLINICAL DATA:  Preoperative evaluation for gastrostomy catheter placement. EXAM: CT ABDOMEN WITHOUT CONTRAST TECHNIQUE: Multidetector CT imaging of the abdomen was performed following the standard protocol without IV contrast. COMPARISON:  None. FINDINGS: Lower chest: Small right pleural effusion with mild right basilar atelectasis. Left lung bases clear. Cardiac size within normal limits. Hepatobiliary: No focal liver abnormality is seen. No gallstones, gallbladder wall thickening, or biliary dilatation. Pancreas: Evaluation is limited by infiltration of the retroperitoneal fat diffusely and noncontrast technique, however, the pancreas is grossly unremarkable. Spleen: Unremarkable Adrenals/Urinary Tract: The adrenal glands are unremarkable. The kidneys are markedly atrophic in keeping with chronic renal insufficiency. No hydronephrosis. No intrarenal calcifications. Stomach/Bowel: Nasogastric tube is seen looped within the gastric lumen. The stomach is decompressed. A the stomach is largely positioned behind the inferior margin of the left hepatic lobe and the more proximal body is seen posterior to the distal transverse colon. A small window, however, is present for access of the stomach, best seen on image # 40 and sagittal reformat # 81. No free intraperitoneal gas. No evidence of obstruction. Vascular/Lymphatic: The abdominal vasculature is unremarkable  on this noncontrast examination. No pathologic adenopathy. Other: There is diffuse infiltration of the fat of the abdominal wall and retroperitoneum in  keeping with anasarca. Musculoskeletal: No acute bone abnormality. Bilateral L5 pars defects are present with grade 1 anterolisthesis of L5 upon S1. No acute bone abnormality. IMPRESSION: Small window for access of the stomach identified involving the distal body of the stomach. Insufflation of the stomach would help widen this potential portal of access. Opacification of the colon with oral contrast may also aid in avoidance of inadvertent puncture. Anasarca. Electronically Signed   By: Fidela Salisbury MD   On: 07/21/2020 03:04    Assessment/Plan Active Problems:   Acute on chronic respiratory failure with hypoxia (HCC)   End stage renal disease on dialysis ([place])   Lobar pneumonia, unspecified organism ([place])   Chronic diastolic CHF (congestive heart failure) ([place])   Acute on chronic respiratory failure with hypoxia we will continue with the T collar.  I think we should be able to complete 24 hours without any difficulty and will continue on the weaning. End-stage renal disease on hemodialysis we will continue to monitor closely. Lobar pneumonia treated slow improvement noted Chronic diastolic heart failure compensated at this time   I have personally seen and evaluated the patient, evaluated laboratory and imaging results, formulated the assessment and plan and placed orders. The Patient requires high complexity decision making with multiple systems involvement.  Rounds were done with the Respiratory Therapy Director and Staff therapists and discussed with nursing staff also.  Allyne Gee, MD [facility] Pulmonary Critical Care Medicine Sleep Medicine

## 2020-07-22 DIAGNOSIS — J9621 Acute and chronic respiratory failure with hypoxia: Secondary | ICD-10-CM | POA: Diagnosis not present

## 2020-07-22 DIAGNOSIS — I5032 Chronic diastolic (congestive) heart failure: Secondary | ICD-10-CM | POA: Diagnosis not present

## 2020-07-22 DIAGNOSIS — N186 End stage renal disease: Secondary | ICD-10-CM | POA: Diagnosis not present

## 2020-07-22 DIAGNOSIS — J181 Lobar pneumonia, unspecified organism: Secondary | ICD-10-CM | POA: Diagnosis not present

## 2020-07-22 NOTE — Progress Notes (Signed)
[facility]  ROUNDING NOTE   Subjective:  Patient does intermittently open eyes. Not following any commands however. Blood pressure currently 154/79.  Objective:  Vital signs in last 24 hours:  Temperature 99 pulse 82 respirations 19 blood pressure 154/79   Physical Exam: General:  Critically ill-appearing  Head:  Normocephalic, atraumatic.    Eyes:  Anicteric  Neck:  Tracheostomy in place  Lungs:   Clear to auscultation, vent assisted  Heart:  S1S2 no rubs  Abdomen:   Soft, nontender, bowel sounds present  Extremities:  Trace peripheral edema.  Neurologic: Eyes open however patient not following commands  Skin:  No acute skin rash  Access:  Left upper extremity AV fistula    Basic Metabolic Panel: Recent Labs  Lab 07/16/20 0626 07/18/20 1015 07/21/20 0815  NA 132* 132* 128*  K 4.0 4.5 4.9  CL 94* 95* 93*  CO2 '27 26 28  '$ GLUCOSE 224* 148* 166*  BUN 83* 79* 100*  CREATININE 5.43* 5.64* 5.70*  CALCIUM 8.4* 8.6* 8.5*  PHOS 3.6 4.4 4.2     Liver Function Tests: Recent Labs  Lab 07/16/20 0626 07/18/20 1015 07/21/20 0815  ALBUMIN 1.8* 1.9* 1.8*    No results for input(s): LIPASE, AMYLASE in the last 168 hours. No results for input(s): AMMONIA in the last 168 hours.  CBC: Recent Labs  Lab 07/15/20 1329 07/16/20 0702 07/18/20 1015 07/21/20 0815  WBC 15.8* 13.6* 13.9* 16.3*  HGB 8.2* 7.1* 8.7* 8.6*  HCT 26.6* 23.4* 27.6* 27.7*  MCV 73.9* 73.8* 74.4* 74.7*  PLT 415* 371 395 421*     Cardiac Enzymes: No results for input(s): CKTOTAL, CKMB, CKMBINDEX, TROPONINI in the last 168 hours.  BNP: Invalid input(s): POCBNP  CBG: No results for input(s): GLUCAP in the last 168 hours.  Microbiology: Results for orders placed or performed during the hospital encounter of 06/29/20  Culture, Respiratory w Gram Stain     Status: None   Collection Time: 06/30/20  4:00 PM   Specimen: Tracheal Aspirate; Respiratory  Result Value Ref Range Status    Specimen Description TRACHEAL ASPIRATE  Final   Special Requests NONE  Final   Gram Stain   Final    FEW WBC PRESENT,BOTH PMN AND MONONUCLEAR MODERATE GRAM NEGATIVE COCCOBACILLI RARE GRAM POSITIVE COCCI IN PAIRS FEW GRAM POSITIVE RODS Performed at [facility], [place] [address], [place], [place] [address]    Culture   Final    ABUNDANT HAEMOPHILUS INFLUENZAE BETA LACTAMASE NEGATIVE FEW STAPHYLOCOCCUS AUREUS    Report Status 07/04/2020 FINAL  Final   Organism ID, Bacteria STAPHYLOCOCCUS AUREUS  Final      Susceptibility   Staphylococcus aureus - MIC*    CIPROFLOXACIN <=0.5 SENSITIVE Sensitive     ERYTHROMYCIN >=8 RESISTANT Resistant     GENTAMICIN <=0.5 SENSITIVE Sensitive     OXACILLIN <=0.25 SENSITIVE Sensitive     TETRACYCLINE <=1 SENSITIVE Sensitive     VANCOMYCIN <=0.5 SENSITIVE Sensitive     TRIMETH/SULFA <=10 SENSITIVE Sensitive     CLINDAMYCIN <=0.25 SENSITIVE Sensitive     RIFAMPIN <=0.5 SENSITIVE Sensitive     Inducible Clindamycin NEGATIVE Sensitive     * FEW STAPHYLOCOCCUS AUREUS  Culture, blood (routine x 2)     Status: Abnormal   Collection Time: 07/08/20  7:12 PM   Specimen: BLOOD  Result Value Ref Range Status   Specimen Description BLOOD SITE NOT SPECIFIED  Final   Special Requests   Final  BOTTLES DRAWN AEROBIC AND ANAEROBIC Blood Culture adequate volume   Culture  Setup Time   Final    GRAM POSITIVE COCCI IN CLUSTERS AEROBIC BOTTLE ONLY CRITICAL VALUE NOTED.  VALUE IS CONSISTENT WITH PREVIOUSLY REPORTED AND CALLED VALUE.    Culture (A)  Final    STAPHYLOCOCCUS HOMINIS THE SIGNIFICANCE OF ISOLATING THIS ORGANISM FROM A SINGLE SET OF BLOOD CULTURES WHEN MULTIPLE SETS ARE DRAWN IS UNCERTAIN. PLEASE NOTIFY THE MICROBIOLOGY DEPARTMENT WITHIN ONE WEEK IF SPECIATION AND SENSITIVITIES ARE REQUIRED. Performed at [facility], [place] [address], [place], [place] [address]    Report Status 07/13/2020 FINAL  Final  Culture, blood (routine x 2)     Status:  Abnormal   Collection Time: 07/08/20  7:12 PM   Specimen: BLOOD  Result Value Ref Range Status   Specimen Description BLOOD SITE NOT SPECIFIED  Final   Special Requests   Final    BOTTLES DRAWN AEROBIC AND ANAEROBIC Blood Culture results may not be optimal due to an inadequate volume of blood received in culture bottles   Culture  Setup Time   Final    GRAM POSITIVE COCCI IN CLUSTERS AEROBIC BOTTLE ONLY Organism ID to follow CRITICAL RESULT CALLED TO, READ BACK BY AND VERIFIED WITH: Mirian Mo RN, 0132 07/10/20 D.VANHOOK    Culture (A)  Final    STAPHYLOCOCCUS EPIDERMIDIS THE SIGNIFICANCE OF ISOLATING THIS ORGANISM FROM A SINGLE SET OF BLOOD CULTURES WHEN MULTIPLE SETS ARE DRAWN IS UNCERTAIN. PLEASE NOTIFY THE MICROBIOLOGY DEPARTMENT WITHIN ONE WEEK IF SPECIATION AND SENSITIVITIES ARE REQUIRED. Performed at [facility], [place] [address] [place], [place] [address]    Report Status 07/10/2020 FINAL  Final  Blood Culture ID Panel (Reflexed)     Status: Abnormal   Collection Time: 07/08/20  7:12 PM  Result Value Ref Range Status   Enterococcus faecalis NOT DETECTED NOT DETECTED Final   Enterococcus Faecium NOT DETECTED NOT DETECTED Final   Listeria monocytogenes NOT DETECTED NOT DETECTED Final   Staphylococcus species DETECTED (A) NOT DETECTED Final    Comment: CRITICAL RESULT CALLED TO, READ BACK BY AND VERIFIED WITH: Mirian Mo RN, 0132 07/10/20 D.VANHOOK    Staphylococcus aureus (BCID) NOT DETECTED NOT DETECTED Final   Staphylococcus epidermidis DETECTED (A) NOT DETECTED Final    Comment: Methicillin (oxacillin) resistant coagulase negative staphylococcus. Possible blood culture contaminant (unless isolated from more than one blood culture draw or clinical case suggests pathogenicity). No antibiotic treatment is indicated for blood  culture contaminants. CRITICAL RESULT CALLED TO, READ BACK BY AND VERIFIED WITH: Mirian Mo RN, 0132 07/10/20 D.VANHOOK    Staphylococcus lugdunensis  NOT DETECTED NOT DETECTED Final   Streptococcus species NOT DETECTED NOT DETECTED Final   Streptococcus agalactiae NOT DETECTED NOT DETECTED Final   Streptococcus pneumoniae NOT DETECTED NOT DETECTED Final   Streptococcus pyogenes NOT DETECTED NOT DETECTED Final   A.calcoaceticus-baumannii NOT DETECTED NOT DETECTED Final   Bacteroides fragilis NOT DETECTED NOT DETECTED Final   Enterobacterales NOT DETECTED NOT DETECTED Final   Enterobacter cloacae complex NOT DETECTED NOT DETECTED Final   Escherichia coli NOT DETECTED NOT DETECTED Final   Klebsiella aerogenes NOT DETECTED NOT DETECTED Final   Klebsiella oxytoca NOT DETECTED NOT DETECTED Final   Klebsiella pneumoniae NOT DETECTED NOT DETECTED Final   Proteus species NOT DETECTED NOT DETECTED Final   Salmonella species NOT DETECTED NOT DETECTED Final   Serratia marcescens NOT DETECTED NOT DETECTED Final   Haemophilus influenzae NOT DETECTED NOT  DETECTED Final   Neisseria meningitidis NOT DETECTED NOT DETECTED Final   Pseudomonas aeruginosa NOT DETECTED NOT DETECTED Final   Stenotrophomonas maltophilia NOT DETECTED NOT DETECTED Final   Candida albicans NOT DETECTED NOT DETECTED Final   Candida auris NOT DETECTED NOT DETECTED Final   Candida glabrata NOT DETECTED NOT DETECTED Final   Candida krusei NOT DETECTED NOT DETECTED Final   Candida parapsilosis NOT DETECTED NOT DETECTED Final   Candida tropicalis NOT DETECTED NOT DETECTED Final   Cryptococcus neoformans/gattii NOT DETECTED NOT DETECTED Final   Methicillin resistance mecA/C DETECTED (A) NOT DETECTED Final    Comment: Performed at [facility] [address] [place], [place] [address]    Coagulation Studies: No results for input(s): LABPROT, INR in the last 72 hours.  Urinalysis: No results for input(s): COLORURINE, LABSPEC, PHURINE, GLUCOSEU, HGBUR, BILIRUBINUR, KETONESUR, PROTEINUR, UROBILINOGEN, NITRITE, LEUKOCYTESUR in the last 72 hours.  Invalid input(s):  APPERANCEUR    Imaging: CT ABDOMEN WO CONTRAST  Result Date: 07/21/2020 CLINICAL DATA:  Preoperative evaluation for gastrostomy catheter placement. EXAM: CT ABDOMEN WITHOUT CONTRAST TECHNIQUE: Multidetector CT imaging of the abdomen was performed following the standard protocol without IV contrast. COMPARISON:  None. FINDINGS: Lower chest: Small right pleural effusion with mild right basilar atelectasis. Left lung bases clear. Cardiac size within normal limits. Hepatobiliary: No focal liver abnormality is seen. No gallstones, gallbladder wall thickening, or biliary dilatation. Pancreas: Evaluation is limited by infiltration of the retroperitoneal fat diffusely and noncontrast technique, however, the pancreas is grossly unremarkable. Spleen: Unremarkable Adrenals/Urinary Tract: The adrenal glands are unremarkable. The kidneys are markedly atrophic in keeping with chronic renal insufficiency. No hydronephrosis. No intrarenal calcifications. Stomach/Bowel: Nasogastric tube is seen looped within the gastric lumen. The stomach is decompressed. A the stomach is largely positioned behind the inferior margin of the left hepatic lobe and the more proximal body is seen posterior to the distal transverse colon. A small window, however, is present for access of the stomach, best seen on image # 40 and sagittal reformat # 81. No free intraperitoneal gas. No evidence of obstruction. Vascular/Lymphatic: The abdominal vasculature is unremarkable on this noncontrast examination. No pathologic adenopathy. Other: There is diffuse infiltration of the fat of the abdominal wall and retroperitoneum in keeping with anasarca. Musculoskeletal: No acute bone abnormality. Bilateral L5 pars defects are present with grade 1 anterolisthesis of L5 upon S1. No acute bone abnormality. IMPRESSION: Small window for access of the stomach identified involving the distal body of the stomach. Insufflation of the stomach would help widen this  potential portal of access. Opacification of the colon with oral contrast may also aid in avoidance of inadvertent puncture. Anasarca. Electronically Signed   By: Fidela Salisbury MD   On: 07/21/2020 03:04     Medications:       Assessment/ Plan:  45 y.o. male with a PMHx of ESRD on HD, posterior reversible encephalopathy syndrome, diabetes mellitus type 2, chronic systolic heart failure, anemia of chronic kidney disease, secondary hyperparathyroidism, malignant hypertension, acute respiratory failure, who was admitted to [facility] on 06/29/2020 for ongoing treatment of acute respiratory failure, posterior reversible encephalopathy syndrome, and end-stage renal disease.   1.  ESRD on HD.  No need for hemodialysis today.  We will plan for hemodialysis treatment again tomorrow.  2.  Anemia of chronic kidney disease.  Hemoglobin currently 8.6.  Continue Retacrit 4000 units IV with dialysis.  3.  Secondary hyperparathyroidism.  Phosphorus at target at 4.2.  Continue to  monitor bone mineral metabolism parameters.  4.  Acute respiratory failure.  Tracheostomy placed 07/17/2020.  Patient breathing comfortably through tracheostomy.  5.  Hypertension.  Blood pressure currently 154/79.  Patient be maintained on amlodipine, clonidine, hydralazine, labetalol, and losartan.  6.  Bacteremia.  Gram-positive cocci in clusters noted previously. Line management and antibiotic management per hospitalist.    LOS: 0 Jeffery Andrews 7/20/202211:04 AM

## 2020-07-22 NOTE — Progress Notes (Signed)
[name]  PROGRESS NOTE     [name]  [id number]  DOB: 12-Jan-1975   DOA: 07/17/2020  Referring Physician: [name], MD  HPI: Jeffery [place] is a 45 y.o. male being followed for ventilator/airway/oxygen weaning Acute on Chronic Respiratory Failure.  He remains off the ventilator and has been successfully weaned from the ventilator  Medications: Reviewed on Rounds  Physical Exam:  Vitals: Temperature is 99.0 pulse 82 respiratory 19 blood pressure is 154/79 saturations 100%  Ventilator Settings on T collar room air  General: Comfortable at this time Neck: supple Cardiovascular: no malignant arrhythmias Respiratory: No rhonchi very coarse breath sounds Skin: no rash seen on limited exam Musculoskeletal: No gross abnormality Psychiatric:unable to assess Neurologic:no involuntary movements         Lab Data:   Basic Metabolic Panel: Recent Labs  Lab 07/16/20 0626 07/18/20 1015 07/21/20 0815  NA 132* 132* 128*  K 4.0 4.5 4.9  CL 94* 95* 93*  CO2 '27 26 28  '$ GLUCOSE 224* 148* 166*  BUN 83* 79* 100*  CREATININE 5.43* 5.64* 5.70*  CALCIUM 8.4* 8.6* 8.5*  PHOS 3.6 4.4 4.2    ABG: Recent Labs  Lab 07/21/20 1035  PHART 7.437  PCO2ART 37.4  PO2ART 115*  HCO3 24.8  O2SAT 98.4    Liver Function Tests: Recent Labs  Lab 07/16/20 0626 07/18/20 1015 07/21/20 0815  ALBUMIN 1.8* 1.9* 1.8*   No results for input(s): LIPASE, AMYLASE in the last 168 hours. No results for input(s): AMMONIA in the last 168 hours.  CBC: Recent Labs  Lab 07/16/20 0702 07/18/20 1015 07/21/20 0815  WBC 13.6* 13.9* 16.3*  HGB 7.1* 8.7* 8.6*  HCT 23.4* 27.6* 27.7*  MCV 73.8* 74.4* 74.7*  PLT 371 395 421*    Cardiac Enzymes: No results for input(s): CKTOTAL, CKMB, CKMBINDEX, TROPONINI in the last 168 hours.  BNP (last 3 results) No results for input(s): BNP in the last  8760 hours.  ProBNP (last 3 results) No results for input(s): PROBNP in the last 8760 hours.  Radiological Exams: CT ABDOMEN WO CONTRAST  Result Date: 07/21/2020 CLINICAL DATA:  Preoperative evaluation for gastrostomy catheter placement. EXAM: CT ABDOMEN WITHOUT CONTRAST TECHNIQUE: Multidetector CT imaging of the abdomen was performed following the standard protocol without IV contrast. COMPARISON:  None. FINDINGS: Lower chest: Small right pleural effusion with mild right basilar atelectasis. Left lung bases clear. Cardiac size within normal limits. Hepatobiliary: No focal liver abnormality is seen. No gallstones, gallbladder wall thickening, or biliary dilatation. Pancreas: Evaluation is limited by infiltration of the retroperitoneal fat diffusely and noncontrast technique, however, the pancreas is grossly unremarkable. Spleen: Unremarkable Adrenals/Urinary Tract: The adrenal glands are unremarkable. The kidneys are markedly atrophic in keeping with chronic renal insufficiency. No hydronephrosis. No intrarenal calcifications. Stomach/Bowel: Nasogastric tube is seen looped within the gastric lumen. The stomach is decompressed. A the stomach is largely positioned behind the inferior margin of the left hepatic lobe and the more proximal body is seen posterior to the distal transverse colon. A small window, however, is present for access of the stomach, best seen on image # 40 and sagittal reformat # 81. No free intraperitoneal gas. No evidence of obstruction. Vascular/Lymphatic: The abdominal vasculature is unremarkable on this noncontrast examination. No pathologic adenopathy. Other: There is diffuse infiltration of the fat of the abdominal wall and retroperitoneum in keeping with anasarca. Musculoskeletal: No acute bone abnormality. Bilateral L5  pars defects are present with grade 1 anterolisthesis of L5 upon S1. No acute bone abnormality. IMPRESSION: Small window for access of the stomach identified  involving the distal body of the stomach. Insufflation of the stomach would help widen this potential portal of access. Opacification of the colon with oral contrast may also aid in avoidance of inadvertent puncture. Anasarca. Electronically Signed   By: Fidela Salisbury MD   On: 07/21/2020 03:04    Assessment/Plan Active Problems:   Acute on chronic respiratory failure with hypoxia (HCC)   End stage renal disease on dialysis ([place])   Lobar pneumonia, unspecified organism ([place])   Chronic diastolic CHF (congestive heart failure) (HCC)   Acute on chronic respiratory failure hypoxia plan is going to be to continue with the T collar I am hoping that we should be able to change him over to a cuffless trach and begin with PMV as well End-stage renal failure on hemodialysis right now Lobar pneumonia has been treated we will continue to follow along Chronic diastolic heart failure compensated at this time   I have personally seen and evaluated the patient, evaluated laboratory and imaging results, formulated the assessment and plan and placed orders. The Patient requires high complexity decision making with multiple systems involvement.  Rounds were done with the Respiratory Therapy Director and Staff therapists and discussed with nursing staff also.  Allyne Gee, MD [facility] Pulmonary Critical Care Medicine Sleep Medicine

## 2020-07-23 DIAGNOSIS — N186 End stage renal disease: Secondary | ICD-10-CM | POA: Diagnosis not present

## 2020-07-23 DIAGNOSIS — J9621 Acute and chronic respiratory failure with hypoxia: Secondary | ICD-10-CM | POA: Diagnosis not present

## 2020-07-23 DIAGNOSIS — J181 Lobar pneumonia, unspecified organism: Secondary | ICD-10-CM | POA: Diagnosis not present

## 2020-07-23 DIAGNOSIS — I5032 Chronic diastolic (congestive) heart failure: Secondary | ICD-10-CM | POA: Diagnosis not present

## 2020-07-23 LAB — CBC
HCT: 25.7 % — ABNORMAL LOW (ref 39.0–52.0)
Hemoglobin: 8.1 g/dL — ABNORMAL LOW (ref 13.0–17.0)
MCH: 23.5 pg — ABNORMAL LOW (ref 26.0–34.0)
MCHC: 31.5 g/dL (ref 30.0–36.0)
MCV: 74.7 fL — ABNORMAL LOW (ref 80.0–100.0)
Platelets: 414 10*3/uL — ABNORMAL HIGH (ref 150–400)
RBC: 3.44 MIL/uL — ABNORMAL LOW (ref 4.22–5.81)
RDW: 18 % — ABNORMAL HIGH (ref 11.5–15.5)
WBC: 16.1 10*3/uL — ABNORMAL HIGH (ref 4.0–10.5)
nRBC: 0 % (ref 0.0–0.2)

## 2020-07-23 LAB — RENAL FUNCTION PANEL
Albumin: 1.9 g/dL — ABNORMAL LOW (ref 3.5–5.0)
Anion gap: 13 (ref 5–15)
BUN: 96 mg/dL — ABNORMAL HIGH (ref 6–20)
CO2: 26 mmol/L (ref 22–32)
Calcium: 8.8 mg/dL — ABNORMAL LOW (ref 8.9–10.3)
Chloride: 91 mmol/L — ABNORMAL LOW (ref 98–111)
Creatinine, Ser: 5.2 mg/dL — ABNORMAL HIGH (ref 0.61–1.24)
GFR, Estimated: 13 mL/min — ABNORMAL LOW (ref >60)
Glucose, Bld: 116 mg/dL — ABNORMAL HIGH (ref 70–99)
Phosphorus: 3.6 mg/dL (ref 2.5–4.6)
Potassium: 4.9 mmol/L (ref 3.5–5.1)
Sodium: 130 mmol/L — ABNORMAL LOW (ref 135–145)

## 2020-07-23 NOTE — Progress Notes (Signed)
P[facility] [place]  PROGRESS NOTE     VIC FLOWERS  [id number]  DOB: [birth year]   DOA: 07/17/2020  Referring Physician: [name], MD  HPI: MALHAR MONTEMARANO is a 45 y.o. male being followed for ventilator/airway/oxygen weaning Acute on Chronic Respiratory Failure.  Patient is off the ventilator on T collar has been on room air doing well  Medications: Reviewed on Rounds  Physical Exam:  Vitals: Temperature is 97.5 pulse 77 respiratory rate is 13 blood pressure is 164/85 saturations 100%  Ventilator Settings on T collar room air  General: Comfortable at this time Neck: supple Cardiovascular: no malignant arrhythmias Respiratory: Scattered rhonchi noted Skin: no rash seen on limited exam Musculoskeletal: No gross abnormality Psychiatric:unable to assess Neurologic:no involuntary movements         Lab Data:   Basic Metabolic Panel: Recent Labs  Lab 07/18/20 1015 07/21/20 0815 07/23/20 0658  NA 132* 128* 130*  K 4.5 4.9 4.9  CL 95* 93* 91*  CO2 '26 28 26  '$ GLUCOSE 148* 166* 116*  BUN 79* 100* 96*  CREATININE 5.64* 5.70* 5.20*  CALCIUM 8.6* 8.5* 8.8*  PHOS 4.4 4.2 3.6    ABG: Recent Labs  Lab 07/21/20 1035  PHART 7.437  PCO2ART 37.4  PO2ART 115*  HCO3 24.8  O2SAT 98.4    Liver Function Tests: Recent Labs  Lab 07/18/20 1015 07/21/20 0815 07/23/20 0658  ALBUMIN 1.9* 1.8* 1.9*   No results for input(s): LIPASE, AMYLASE in the last 168 hours. No results for input(s): AMMONIA in the last 168 hours.  CBC: Recent Labs  Lab 07/18/20 1015 07/21/20 0815 07/23/20 0658  WBC 13.9* 16.3* 16.1*  HGB 8.7* 8.6* 8.1*  HCT 27.6* 27.7* 25.7*  MCV 74.4* 74.7* 74.7*  PLT 395 421* 414*    Cardiac Enzymes: No results for input(s): CKTOTAL, CKMB, CKMBINDEX, TROPONINI in the last 168 hours.  BNP (last 3 results) No results for input(s): BNP in the last 8760  hours.  ProBNP (last 3 results) No results for input(s): PROBNP in the last 8760 hours.  Radiological Exams: No results found.  Assessment/Plan Active Problems:   Acute on chronic respiratory failure with hypoxia (HCC)   End stage renal disease on dialysis (HCC)   Lobar pneumonia, unspecified organism (HCC)   Chronic diastolic CHF (congestive heart failure) (HCC)   Acute on chronic respiratory failure with hypoxia doing well with the weaning on T collar.  Respiratory therapy reports that secretions have been minimal End-stage renal failure on hemodialysis Lobar pneumonia treated continue to follow along. Chronic diastolic heart failure compensated   I have personally seen and evaluated the patient, evaluated laboratory and imaging results, formulated the assessment and plan and placed orders. The Patient requires high complexity decision making with multiple systems involvement.  Rounds were done with the Respiratory Therapy Director and Staff therapists and discussed with nursing staff also.  Allyne Gee, MD [facility] Pulmonary Critical Care Medicine Sleep Medicine

## 2020-07-24 DIAGNOSIS — J181 Lobar pneumonia, unspecified organism: Secondary | ICD-10-CM | POA: Diagnosis not present

## 2020-07-24 DIAGNOSIS — J9621 Acute and chronic respiratory failure with hypoxia: Secondary | ICD-10-CM | POA: Diagnosis not present

## 2020-07-24 DIAGNOSIS — N186 End stage renal disease: Secondary | ICD-10-CM | POA: Diagnosis not present

## 2020-07-24 DIAGNOSIS — I5032 Chronic diastolic (congestive) heart failure: Secondary | ICD-10-CM | POA: Diagnosis not present

## 2020-07-24 NOTE — Progress Notes (Signed)
[facility]  ROUNDING NOTE   Subjective:  Patient seen and evaluated at bedside.  Blood pressure currently 173/83.  Objective:  Vital signs in last 24 hours:  Temperature 97.3 pulse 73 respiration 16 blood pressure 173/83   Physical Exam: General:  Critically ill-appearing  Head:  Normocephalic, atraumatic.    Eyes:  Anicteric  Neck:  Tracheostomy in place  Lungs:   Clear to auscultation, vent assisted  Heart:  S1S2 no rubs  Abdomen:   Soft, nontender, bowel sounds present  Extremities:  Trace peripheral edema.  Neurologic:  Eyes open however patient not following commands  Skin:  No acute skin rash  Access:  Left upper extremity AV fistula    Basic Metabolic Panel: Recent Labs  Lab 07/18/20 1015 07/21/20 0815 07/23/20 0658  NA 132* 128* 130*  K 4.5 4.9 4.9  CL 95* 93* 91*  CO2 '26 28 26  '$ GLUCOSE 148* 166* 116*  BUN 79* 100* 96*  CREATININE 5.64* 5.70* 5.20*  CALCIUM 8.6* 8.5* 8.8*  PHOS 4.4 4.2 3.6     Liver Function Tests: Recent Labs  Lab 07/18/20 1015 07/21/20 0815 07/23/20 0658  ALBUMIN 1.9* 1.8* 1.9*    No results for input(s): LIPASE, AMYLASE in the last 168 hours. No results for input(s): AMMONIA in the last 168 hours.  CBC: Recent Labs  Lab 07/18/20 1015 07/21/20 0815 07/23/20 0658  WBC 13.9* 16.3* 16.1*  HGB 8.7* 8.6* 8.1*  HCT 27.6* 27.7* 25.7*  MCV 74.4* 74.7* 74.7*  PLT 395 421* 414*     Cardiac Enzymes: No results for input(s): CKTOTAL, CKMB, CKMBINDEX, TROPONINI in the last 168 hours.  BNP: Invalid input(s): POCBNP  CBG: No results for input(s): GLUCAP in the last 168 hours.  Microbiology: Results for orders placed or performed during the hospital encounter of 06/29/20  Culture, Respiratory w Gram Stain     Status: None   Collection Time: 06/30/20  4:00 PM   Specimen: Tracheal Aspirate; Respiratory  Result Value Ref Range Status   Specimen Description TRACHEAL ASPIRATE  Final   Special Requests NONE   Final   Gram Stain   Final    FEW WBC PRESENT,BOTH PMN AND MONONUCLEAR MODERATE GRAM NEGATIVE COCCOBACILLI RARE GRAM POSITIVE COCCI IN PAIRS FEW GRAM POSITIVE RODS Performed at [facility], [place] [address] [place], [place] [address]    Culture   Final    ABUNDANT HAEMOPHILUS INFLUENZAE BETA LACTAMASE NEGATIVE FEW STAPHYLOCOCCUS AUREUS    Report Status 07/04/2020 FINAL  Final   Organism ID, Bacteria STAPHYLOCOCCUS AUREUS  Final      Susceptibility   Staphylococcus aureus - MIC*    CIPROFLOXACIN <=0.5 SENSITIVE Sensitive     ERYTHROMYCIN >=8 RESISTANT Resistant     GENTAMICIN <=0.5 SENSITIVE Sensitive     OXACILLIN <=0.25 SENSITIVE Sensitive     TETRACYCLINE <=1 SENSITIVE Sensitive     VANCOMYCIN <=0.5 SENSITIVE Sensitive     TRIMETH/SULFA <=10 SENSITIVE Sensitive     CLINDAMYCIN <=0.25 SENSITIVE Sensitive     RIFAMPIN <=0.5 SENSITIVE Sensitive     Inducible Clindamycin NEGATIVE Sensitive     * FEW STAPHYLOCOCCUS AUREUS  Culture, blood (routine x 2)     Status: Abnormal   Collection Time: 07/08/20  7:12 PM   Specimen: BLOOD  Result Value Ref Range Status   Specimen Description BLOOD SITE NOT SPECIFIED  Final   Special Requests   Final    BOTTLES DRAWN AEROBIC AND ANAEROBIC Blood Culture adequate volume  Culture  Setup Time   Final    GRAM POSITIVE COCCI IN CLUSTERS AEROBIC BOTTLE ONLY CRITICAL VALUE NOTED.  VALUE IS CONSISTENT WITH PREVIOUSLY REPORTED AND CALLED VALUE.    Culture (A)  Final    STAPHYLOCOCCUS HOMINIS THE SIGNIFICANCE OF ISOLATING THIS ORGANISM FROM A SINGLE SET OF BLOOD CULTURES WHEN MULTIPLE SETS ARE DRAWN IS UNCERTAIN. PLEASE NOTIFY THE MICROBIOLOGY DEPARTMENT WITHIN ONE WEEK IF SPECIATION AND SENSITIVITIES ARE REQUIRED. Performed at [facility], [place] [address] [place], [place] [address]    Report Status 07/13/2020 FINAL  Final  Culture, blood (routine x 2)     Status: Abnormal   Collection Time: 07/08/20  7:12 PM   Specimen: BLOOD   Result Value Ref Range Status   Specimen Description BLOOD SITE NOT SPECIFIED  Final   Special Requests   Final    BOTTLES DRAWN AEROBIC AND ANAEROBIC Blood Culture results may not be optimal due to an inadequate volume of blood received in culture bottles   Culture  Setup Time   Final    GRAM POSITIVE COCCI IN CLUSTERS AEROBIC BOTTLE ONLY Organism ID to follow CRITICAL RESULT CALLED TO, READ BACK BY AND VERIFIED WITH: Mirian Mo RN, [id number] 07/10/20 D.VANHOOK    Culture (A)  Final    STAPHYLOCOCCUS EPIDERMIDIS THE SIGNIFICANCE OF ISOLATING THIS ORGANISM FROM A SINGLE SET OF BLOOD CULTURES WHEN MULTIPLE SETS ARE DRAWN IS UNCERTAIN. PLEASE NOTIFY THE MICROBIOLOGY DEPARTMENT WITHIN ONE WEEK IF SPECIATION AND SENSITIVITIES ARE REQUIRED. Performed at [facility], [place] [address] [place], [place] [address]    Report Status 07/10/2020 FINAL  Final  Blood Culture ID Panel (Reflexed)     Status: Abnormal   Collection Time: 07/08/20  7:12 PM  Result Value Ref Range Status   Enterococcus faecalis NOT DETECTED NOT DETECTED Final   Enterococcus Faecium NOT DETECTED NOT DETECTED Final   Listeria monocytogenes NOT DETECTED NOT DETECTED Final   Staphylococcus species DETECTED (A) NOT DETECTED Final    Comment: CRITICAL RESULT CALLED TO, READ BACK BY AND VERIFIED WITH: Mirian Mo RN, [id number] 07/10/20 D.VANHOOK    Staphylococcus aureus (BCID) NOT DETECTED NOT DETECTED Final   Staphylococcus epidermidis DETECTED (A) NOT DETECTED Final    Comment: Methicillin (oxacillin) resistant coagulase negative staphylococcus. Possible blood culture contaminant (unless isolated from more than one blood culture draw or clinical case suggests pathogenicity). No antibiotic treatment is indicated for blood  culture contaminants. CRITICAL RESULT CALLED TO, READ BACK BY AND VERIFIED WITH: Mirian Mo RN, 0132 07/10/20 D.VANHOOK    Staphylococcus lugdunensis NOT DETECTED NOT DETECTED Final   Streptococcus species NOT  DETECTED NOT DETECTED Final   Streptococcus agalactiae NOT DETECTED NOT DETECTED Final   Streptococcus pneumoniae NOT DETECTED NOT DETECTED Final   Streptococcus pyogenes NOT DETECTED NOT DETECTED Final   A.calcoaceticus-baumannii NOT DETECTED NOT DETECTED Final   Bacteroides fragilis NOT DETECTED NOT DETECTED Final   Enterobacterales NOT DETECTED NOT DETECTED Final   Enterobacter cloacae complex NOT DETECTED NOT DETECTED Final   Escherichia coli NOT DETECTED NOT DETECTED Final   Klebsiella aerogenes NOT DETECTED NOT DETECTED Final   Klebsiella oxytoca NOT DETECTED NOT DETECTED Final   Klebsiella pneumoniae NOT DETECTED NOT DETECTED Final   Proteus species NOT DETECTED NOT DETECTED Final   Salmonella species NOT DETECTED NOT DETECTED Final   Serratia marcescens NOT DETECTED NOT DETECTED Final   Haemophilus influenzae NOT DETECTED NOT DETECTED Final   Neisseria meningitidis NOT DETECTED NOT DETECTED Final  Pseudomonas aeruginosa NOT DETECTED NOT DETECTED Final   Stenotrophomonas maltophilia NOT DETECTED NOT DETECTED Final   Candida albicans NOT DETECTED NOT DETECTED Final   Candida auris NOT DETECTED NOT DETECTED Final   Candida glabrata NOT DETECTED NOT DETECTED Final   Candida krusei NOT DETECTED NOT DETECTED Final   Candida parapsilosis NOT DETECTED NOT DETECTED Final   Candida tropicalis NOT DETECTED NOT DETECTED Final   Cryptococcus neoformans/gattii NOT DETECTED NOT DETECTED Final   Methicillin resistance mecA/C DETECTED (A) NOT DETECTED Final    Comment: Performed at [facility], [place] [address] [place], [place] [address]    Coagulation Studies: No results for input(s): LABPROT, INR in the last 72 hours.  Urinalysis: No results for input(s): COLORURINE, LABSPEC, PHURINE, GLUCOSEU, HGBUR, BILIRUBINUR, KETONESUR, PROTEINUR, UROBILINOGEN, NITRITE, LEUKOCYTESUR in the last 72 hours.  Invalid input(s): APPERANCEUR    Imaging: No results found.   Medications:        Assessment/ Plan:  45 y.o. male with a PMHx of ESRD on HD, posterior reversible encephalopathy syndrome, diabetes mellitus type 2, chronic systolic heart failure, anemia of chronic kidney disease, secondary hyperparathyroidism, malignant hypertension, acute respiratory failure, who was admitted to [facility] on 06/29/2020 for ongoing treatment of acute respiratory failure, posterior reversible encephalopathy syndrome, and end-stage renal disease.   1.  ESRD on HD.  Patient seen and evaluated at bedside.  Due for dialysis treatment again tomorrow.  2.  Anemia of chronic kidney disease.  Hemoglobin currently 8.1.  Continue to monitor.  Maintain the patient on Retacrit 4000 IV with dialysis.  3.  Secondary hyperparathyroidism.  Phosphorus 3.6 and acceptable.  4.  Acute respiratory failure.  Tracheostomy placed 07/17/2020.  Patient breathing comfortably through tracheostomy.  5.  Hypertension.  Continue amlodipine, clonidine, drowsing, labetalol, and losartan.  Continues to have labile blood pressure.  6.  Bacteremia.  Gram-positive cocci in clusters noted previously. Line management and antibiotic management per hospitalist.    LOS: 0 Jeffery [place] 7/22/20226:04 PM

## 2020-07-24 NOTE — Progress Notes (Signed)
Pulmonary [place]  PROGRESS NOTE     Jeffery [place]  [id number]  DOB: [birth year]   DOA: 07/17/2020  Referring Physician: [name], MD  HPI: Jeffery [place] is a 45 y.o. male being followed for ventilator/airway/oxygen weaning Acute on Chronic Respiratory Failure.  Patient remains on T collar room air doing very well.  Medications: Reviewed on Rounds  Physical Exam:  Vitals: Temperature is 97.3 pulse 75 respiratory 16 blood pressure is 158/80 saturations 100%  Ventilator Settings on T collar room air  General: Comfortable at this time Neck: supple Cardiovascular: no malignant arrhythmias Respiratory: No rhonchi no rales are noted at this time Skin: no rash seen on limited exam Musculoskeletal: No gross abnormality Psychiatric:unable to assess Neurologic:no involuntary movements         Lab Data:   Basic Metabolic Panel: Recent Labs  Lab 07/18/20 1015 07/21/20 0815 07/23/20 0658  NA 132* 128* 130*  K 4.5 4.9 4.9  CL 95* 93* 91*  CO2 '26 28 26  '$ GLUCOSE 148* 166* 116*  BUN 79* 100* 96*  CREATININE 5.64* 5.70* 5.20*  CALCIUM 8.6* 8.5* 8.8*  PHOS 4.4 4.2 3.6    ABG: Recent Labs  Lab 07/21/20 1035  PHART 7.437  PCO2ART 37.4  PO2ART 115*  HCO3 24.8  O2SAT 98.4    Liver Function Tests: Recent Labs  Lab 07/18/20 1015 07/21/20 0815 07/23/20 0658  ALBUMIN 1.9* 1.8* 1.9*   No results for input(s): LIPASE, AMYLASE in the last 168 hours. No results for input(s): AMMONIA in the last 168 hours.  CBC: Recent Labs  Lab 07/18/20 1015 07/21/20 0815 07/23/20 0658  WBC 13.9* 16.3* 16.1*  HGB 8.7* 8.6* 8.1*  HCT 27.6* 27.7* 25.7*  MCV 74.4* 74.7* 74.7*  PLT 395 421* 414*    Cardiac Enzymes: No results for input(s): CKTOTAL, CKMB, CKMBINDEX, TROPONINI in the last 168 hours.  BNP (last 3 results) No results for input(s): BNP in the last 8760 hours.  ProBNP  (last 3 results) No results for input(s): PROBNP in the last 8760 hours.  Radiological Exams: No results found.  Assessment/Plan Active Problems:   Acute on chronic respiratory failure with hypoxia (HCC)   End stage renal disease on dialysis (HCC)   Lobar pneumonia, unspecified organism (HCC)   Chronic diastolic CHF (congestive heart failure) (HCC)   Acute on chronic respiratory failure hypoxia we will continue with the weaning.  I am hopeful that next week we should be able to do capping we will start with PMV now End-stage renal failure patient is on hemodialysis Lobar pneumonia treated with supportive care Chronic diastolic heart failure is compensated   I have personally seen and evaluated the patient, evaluated laboratory and imaging results, formulated the assessment and plan and placed orders. The Patient requires high complexity decision making with multiple systems involvement.  Rounds were done with the Respiratory Therapy Director and Staff therapists and discussed with nursing staff also.  Allyne Gee, MD [facility] Pulmonary Critical Care Medicine Sleep Medicine

## 2020-07-25 ENCOUNTER — Encounter: Payer: Self-pay | Admitting: Internal Medicine

## 2020-07-25 LAB — CBC
HCT: 23.2 % — ABNORMAL LOW (ref 39.0–52.0)
Hemoglobin: 7.3 g/dL — ABNORMAL LOW (ref 13.0–17.0)
MCH: 23.9 pg — ABNORMAL LOW (ref 26.0–34.0)
MCHC: 31.5 g/dL (ref 30.0–36.0)
MCV: 75.8 fL — ABNORMAL LOW (ref 80.0–100.0)
Platelets: 400 10*3/uL (ref 150–400)
RBC: 3.06 MIL/uL — ABNORMAL LOW (ref 4.22–5.81)
RDW: 18.4 % — ABNORMAL HIGH (ref 11.5–15.5)
WBC: 15.4 10*3/uL — ABNORMAL HIGH (ref 4.0–10.5)
nRBC: 0 % (ref 0.0–0.2)

## 2020-07-25 LAB — PROTIME-INR
INR: 1.1 (ref 0.8–1.2)
Prothrombin Time: 14.2 seconds (ref 11.4–15.2)

## 2020-07-25 LAB — RENAL FUNCTION PANEL
Albumin: 1.8 g/dL — ABNORMAL LOW (ref 3.5–5.0)
Anion gap: 9 (ref 5–15)
BUN: 101 mg/dL — ABNORMAL HIGH (ref 6–20)
CO2: 27 mmol/L (ref 22–32)
Calcium: 8.9 mg/dL (ref 8.9–10.3)
Chloride: 92 mmol/L — ABNORMAL LOW (ref 98–111)
Creatinine, Ser: 4.98 mg/dL — ABNORMAL HIGH (ref 0.61–1.24)
GFR, Estimated: 14 mL/min — ABNORMAL LOW (ref >60)
Glucose, Bld: 156 mg/dL — ABNORMAL HIGH (ref 70–99)
Phosphorus: 3.7 mg/dL (ref 2.5–4.6)
Potassium: 4.6 mmol/L (ref 3.5–5.1)
Sodium: 128 mmol/L — ABNORMAL LOW (ref 135–145)

## 2020-07-25 NOTE — H&P (Addendum)
Chief Complaint: Patient was seen in consultation today for image guided gastrostomy tube placement at the request of Dr. Laren Everts, A.  Referring Physician(s): Dr. Laren Everts, A.  Supervising Physician: Aletta Edouard  Patient Status: Jeffery Andrews  History of Present Illness: Jeffery Andrews is a 45 y.o. male with ESRD on renal replacement therapy, CHF, HTN, chronic respiratory failure s/p tracheostomy on 07/17/2020, posterior reversible encephalopathy syndrome.  Patient has been not responsive to verbal stimuli. patient has been hospitalized at [facility] since 06/29/2020 for long-term care.  As patient's oral intake has been poor, a long-term feeding tube placement was recommended to the family.   IR was requested for image guided gastrostomy tube placement. CT abdomen was ordered for anatomy eval, patient will need oral contrast the day before of the procedure followed by abdominal x-ray for further evaluation per Dr. Laurence Ferrari.  Patient was evaluated at the bedside, on tracheostomy.  Not in acute distress, still not responsive to verbal stimuli.  History reviewed. No pertinent past medical history.    Allergies: Patient has no allergy information on record.  Medications: Prior to Admission medications   Not on File     No family history on file.  Social History   Socioeconomic History   Marital status: Single    Spouse name: Not on file   Number of children: Not on file   Years of education: Not on file   Highest education level: Not on file  Occupational History   Not on file  Tobacco Use   Smoking status: Not on file   Smokeless tobacco: Not on file  Substance and Sexual Activity   Alcohol use: Not on file   Drug use: Not on file   Sexual activity: Not on file  Other Topics Concern   Not on file  Social History Narrative   Not on file   Social Determinants of Health   Financial Resource Strain: Not on file  Food  Insecurity: Not on file  Transportation Needs: Not on file  Physical Activity: Not on file  Stress: Not on file  Social Connections: Not on file     Review of Systems: A 12 point ROS discussed and pertinent positives are indicated in the HPI above.  All other systems are negative.   Vital Signs: There were no vitals taken for this visit.  Physical Exam Vitals reviewed.  Constitutional:      General: He is not in acute distress.    Appearance: He is ill-appearing.  HENT:     Nose:     Comments: Feeding tube present Neck:     Comments: Tracheostomy present Cardiovascular:     Rate and Rhythm: Normal rate and regular rhythm.     Pulses: Normal pulses.     Heart sounds: Normal heart sounds.  Pulmonary:     Breath sounds: Normal breath sounds.  Abdominal:     General: Abdomen is flat. Bowel sounds are normal.     Palpations: Abdomen is soft.  Skin:    General: Skin is warm and dry.  Neurological:     Mental Status: Mental status is at baseline.     Comments: Patient nonresponsive to verbal stimuli    MD Evaluation Airway: WNL Heart: WNL Abdomen: WNL Chest/ Lungs: WNL ASA  Classification: 3 Mallampati/Airway Score: Two (Limited assesment, patient not responsive to verbl stimuli and not able to follow command.)  Imaging: CT ABDOMEN WO CONTRAST  Result Date: 07/21/2020 CLINICAL DATA:  Preoperative evaluation  for gastrostomy catheter placement. EXAM: CT ABDOMEN WITHOUT CONTRAST TECHNIQUE: Multidetector CT imaging of the abdomen was performed following the standard protocol without IV contrast. COMPARISON:  None. FINDINGS: Lower chest: Small right pleural effusion with mild right basilar atelectasis. Left lung bases clear. Cardiac size within normal limits. Hepatobiliary: No focal liver abnormality is seen. No gallstones, gallbladder wall thickening, or biliary dilatation. Pancreas: Evaluation is limited by infiltration of the retroperitoneal fat diffusely and noncontrast  technique, however, the pancreas is grossly unremarkable. Spleen: Unremarkable Adrenals/Urinary Tract: The adrenal glands are unremarkable. The kidneys are markedly atrophic in keeping with chronic renal insufficiency. No hydronephrosis. No intrarenal calcifications. Stomach/Bowel: Nasogastric tube is seen looped within the gastric lumen. The stomach is decompressed. A the stomach is largely positioned behind the inferior margin of the left hepatic lobe and the more proximal body is seen posterior to the distal transverse colon. A small window, however, is present for access of the stomach, best seen on image # 40 and sagittal reformat # 81. No free intraperitoneal gas. No evidence of obstruction. Vascular/Lymphatic: The abdominal vasculature is unremarkable on this noncontrast examination. No pathologic adenopathy. Other: There is diffuse infiltration of the fat of the abdominal wall and retroperitoneum in keeping with anasarca. Musculoskeletal: No acute bone abnormality. Bilateral L5 pars defects are present with grade 1 anterolisthesis of L5 upon S1. No acute bone abnormality. IMPRESSION: Small window for access of the stomach identified involving the distal body of the stomach. Insufflation of the stomach would help widen this potential portal of access. Opacification of the colon with oral contrast may also aid in avoidance of inadvertent puncture. Anasarca. Electronically Signed   By: Fidela Salisbury MD   On: 07/21/2020 03:04   DG Chest Port 1 View  Result Date: 07/08/2020 CLINICAL DATA:  PICC line repositioning. EXAM: PORTABLE CHEST 1 VIEW COMPARISON:  Earlier the same date and 06/29/2020. FINDINGS: 1828 hours. Right arm PICC has been redirected with the tip overlying the superior cavoatrial junction. The endotracheal and enteric tubes are unchanged in position. The lungs appear clear. There is no pleural effusion or pneumothorax. The heart size and mediastinal contours are unchanged. IMPRESSION: PICC line  has been redirected with the tip projecting over the superior cavoatrial junction. Electronically Signed   By: Richardean Sale M.D.   On: 07/08/2020 18:50   DG Chest Port 1 View  Result Date: 07/08/2020 CLINICAL DATA:  PICC line placement. EXAM: PORTABLE CHEST 1 VIEW COMPARISON:  06/29/2020. FINDINGS: 1821 hours. Right IJ PICC appears looped in the left brachiocephalic vein with the tip projected laterally. Endotracheal tube tip projects over the mid trachea. Enteric tube projects below the diaphragm. The heart size and mediastinal contours are stable. There is no pleural effusion or pneumothorax. Skin folds overlie the left chest. These are less conspicuous on repeat examination. IMPRESSION: Malpositioned right IJ PICC line as described. Repeat examination has already been performed; see separate report. Electronically Signed   By: Richardean Sale M.D.   On: 07/08/2020 18:49   DG CHEST PORT 1 VIEW  Result Date: 06/29/2020 CLINICAL DATA:  Intubation EXAM: PORTABLE CHEST - 1 VIEW PORTABLE ABDOMEN - 1 VIEW COMPARISON:  None. FINDINGS: *Endotracheal tube tip terminates 5.8 cm from the carina. *Transesophageal tube tip and side port are distal to the GE junction, coiling in the gastric lumen with the tip redirected towards the gastric fundus. *Telemetry leads overlie the chest. Patchy ill-defined opacities are present in the right lung base, could early airspace disease or atelectasis.  No pneumothorax or visible effusion. Cardiomediastinal contours are unremarkable for the portable technique. No suspicious abdominal calcifications or obstructive bowel gas pattern within the included portions of the upper abdomen. No acute osseous abnormality or suspicious osseous lesion. IMPRESSION: Lines and tubes as above. Patchy opacity in the right lower lung, possible atelectasis or early consolidation. Electronically Signed   By: Lovena Le M.D.   On: 06/29/2020 22:20   DG Abd Portable 1V  Result Date:  06/29/2020 CLINICAL DATA:  Intubation EXAM: PORTABLE CHEST - 1 VIEW PORTABLE ABDOMEN - 1 VIEW COMPARISON:  None. FINDINGS: *Endotracheal tube tip terminates 5.8 cm from the carina. *Transesophageal tube tip and side port are distal to the GE junction, coiling in the gastric lumen with the tip redirected towards the gastric fundus. *Telemetry leads overlie the chest. Patchy ill-defined opacities are present in the right lung base, could early airspace disease or atelectasis. No pneumothorax or visible effusion. Cardiomediastinal contours are unremarkable for the portable technique. No suspicious abdominal calcifications or obstructive bowel gas pattern within the included portions of the upper abdomen. No acute osseous abnormality or suspicious osseous lesion. IMPRESSION: Lines and tubes as above. Patchy opacity in the right lower lung, possible atelectasis or early consolidation. Electronically Signed   By: Lovena Le M.D.   On: 06/29/2020 22:20   EEG adult  Result Date: 07/08/2020 Lora Havens, MD     07/08/2020 11:36 AM Patient Name: Jeffery Andrews MRN: [id number] Epilepsy Attending: Lora Havens Referring Physician/Provider: Dr [name] Date: 07/08/2020 Duration: 23.34 mins Patient history: 45 year old male with altered mental status.  EEG to evaluate for seizures. Level of alertness: lethargic AEDs during EEG study: None Technical aspects: This EEG study was done with scalp electrodes positioned according to the 10-20 International system of electrode placement. Electrical activity was acquired at a sampling rate of '500Hz'$  and reviewed with a high frequency filter of '70Hz'$  and a low frequency filter of '1Hz'$ . EEG data were recorded continuously and digitally stored. Description: EEG showed continuous generalized 5 to 6 Hz theta as well as intermittent generalized 2 to 3 Hz delta slowing. Hyperventilation and photic stimulation were not performed.   ABNORMALITY - Continuous slow, generalized  IMPRESSION: This study is suggestive of moderate to severe diffuse encephalopathy, nonspecific etiology. No seizures or epileptiform discharges were seen throughout the recording. Lora Havens   ECHOCARDIOGRAM COMPLETE  Result Date: 07/03/2020    ECHOCARDIOGRAM REPORT   Patient Name:   Jeffery Andrews Date of Exam: 07/02/2020 Medical Rec #:  [id number]          Height: Accession #:    [id number]         Weight: Date of Birth:  Jul 02, 1975          BSA: Patient Age:    45 years           BP:           161/79 mmHg Patient Gender: M                  HR:           72 bpm. Exam Location:  Inpatient Procedure: 2D Echo, 3D Echo, Cardiac Doppler and Color Doppler Indications:     Congestive Heart Failure I50.9  History:         Patient has no prior history of Echocardiogram examinations.                  End stage renal  disease. Chronic systolic heart failure,                  Anemia, Thyroid disease, Acute respiratory failure,                  Hypertension.  Sonographer:     Darlina Sicilian RDCS Referring Phys:  [place] Diagnosing Phys: Dixie Dials MD IMPRESSIONS  1. Left ventricular ejection fraction, by estimation, is 65 to 70%. The left ventricle has normal function. The left ventricle has no regional wall motion abnormalities. There is moderate concentric left ventricular hypertrophy. Left ventricular diastolic parameters are consistent with Grade II diastolic dysfunction (pseudonormalization).  2. Right ventricular systolic function is normal. The right ventricular size is normal.  3. Left atrial size was moderately dilated.  4. Right atrial size was mildly dilated.  5. A small pericardial effusion is present. The pericardial effusion is circumferential. There is no evidence of cardiac tamponade.  6. The mitral valve is normal in structure. Mild to moderate mitral valve regurgitation.  7. The aortic valve is tricuspid. Aortic valve regurgitation is trivial.  8. The inferior vena cava is normal in size  with greater than 50% respiratory variability, suggesting right atrial pressure of 3 mmHg. FINDINGS  Left Ventricle: Left ventricular ejection fraction, by estimation, is 65 to 70%. The left ventricle has normal function. The left ventricle has no regional wall motion abnormalities. The left ventricular internal cavity size was normal in size. There is  moderate concentric left ventricular hypertrophy. Left ventricular diastolic parameters are consistent with Grade II diastolic dysfunction (pseudonormalization). Right Ventricle: The right ventricular size is normal. No increase in right ventricular wall thickness. Right ventricular systolic function is normal. Left Atrium: Left atrial size was moderately dilated. Right Atrium: Right atrial size was mildly dilated. Pericardium: A small pericardial effusion is present. The pericardial effusion is circumferential. There is no evidence of cardiac tamponade. Mitral Valve: The mitral valve is normal in structure. Mild to moderate mitral valve regurgitation. Tricuspid Valve: The tricuspid valve is normal in structure. Tricuspid valve regurgitation is mild. Aortic Valve: The aortic valve is tricuspid. Aortic valve regurgitation is trivial. Pulmonic Valve: The pulmonic valve was normal in structure. Pulmonic valve regurgitation is trivial. Aorta: The aortic root is normal in size and structure. There is minimal (Grade I) atheroma plaque involving the aortic root. Venous: The inferior vena cava is normal in size with greater than 50% respiratory variability, suggesting right atrial pressure of 3 mmHg. IAS/Shunts: No atrial level shunt detected by color flow Doppler.  LEFT VENTRICLE PLAX 2D LVIDd:         5.00 cm  Diastology LVIDs:         2.70 cm  LV e' medial:    7.43 cm/s LV PW:         1.80 cm  LV E/e' medial:  13.6 LV IVS:        1.80 cm  LV e' lateral:   7.94 cm/s LVOT diam:     2.40 cm  LV E/e' lateral: 12.7 LV SV:         125 LVOT Area:     4.52 cm  RIGHT VENTRICLE RV  S prime:     21.10 cm/s TAPSE (M-mode): 3.2 cm LEFT ATRIUM             RIGHT ATRIUM LA diam:        4.60 cm RA Area:     12.90 cm LA Vol (A2C):  77.1 ml RA Volume:   26.50 ml LA Vol (A4C):   64.9 ml LA Biplane Vol: 84.0 ml  AORTIC VALVE LVOT Vmax:   137.00 cm/s LVOT Vmean:  90.700 cm/s LVOT VTI:    0.276 m  AORTA Ao Root diam: 3.50 cm Ao Asc diam:  3.30 cm MITRAL VALVE                TRICUSPID VALVE MV Area (PHT): 3.15 cm     TR Peak grad:   28.9 mmHg MV Decel Time: 241 msec     TR Vmax:        269.00 cm/s MV E velocity: 101.00 cm/s MV A velocity: 89.30 cm/s   SHUNTS MV E/A ratio:  1.13         Systemic VTI:  0.28 m                             Systemic Diam: 2.40 cm Dixie Dials MD Electronically signed by Dixie Dials MD Signature Date/Time: 07/03/2020/1:45:55 PM    Final     Labs:  CBC: Recent Labs    07/18/20 1015 07/21/20 0815 07/23/20 0658 07/25/20 0806  WBC 13.9* 16.3* 16.1* 15.4*  HGB 8.7* 8.6* 8.1* 7.3*  HCT 27.6* 27.7* 25.7* 23.2*  PLT 395 421* 414* 400    COAGS: No results for input(s): INR, APTT in the last 8760 hours.  BMP: Recent Labs    07/18/20 1015 07/21/20 0815 07/23/20 0658 07/25/20 0806  NA 132* 128* 130* 128*  K 4.5 4.9 4.9 4.6  CL 95* 93* 91* 92*  CO2 '26 28 26 27  '$ GLUCOSE 148* 166* 116* 156*  BUN 79* 100* 96* 101*  CALCIUM 8.6* 8.5* 8.8* 8.9  CREATININE 5.64* 5.70* 5.20* 4.98*  GFRNONAA 12* 12* 13* 14*    LIVER FUNCTION TESTS: Recent Labs    06/29/20 2137 07/02/20 1221 07/18/20 1015 07/21/20 0815 07/23/20 0658 07/25/20 0806  BILITOT 0.6  --   --   --   --   --   AST 33  --   --   --   --   --   ALT 24  --   --   --   --   --   ALKPHOS 58  --   --   --   --   --   PROT 6.0*  --   --   --   --   --   ALBUMIN 2.2*   < > 1.9* 1.8* 1.9* 1.8*   < > = values in this interval not displayed.    TUMOR MARKERS: No results for input(s): AFPTM, CEA, CA199, CHROMGRNA in the last 8760 hours.  Assessment and Plan: 45 y.o. male in need of a long-term  feeding tube for nutrition.  IR was requested for image guided gastrostomy tube placement. CT abdomen was ordered for anatomy eval, patient will need oral contrast the day before of the procedure followed by abdominal x-ray for further evaluation per Dr. Laurence Ferrari.  CBC on 07/25/20 with persistently elevated WBC 15.4, hgb 7.3, plt 400 Patient afebrile per [facility] chart  INR ordered.  Not on anticoagulation/antiplatelet   Patient is not responsive to verbal stimuli and non verbal, not capable of making his own medical decisions.  Patient's father was contacted to obtain consent.   Risks and benefits image guided gastrostomy tube placement was discussed with the patient's father including, but not limited to the  need for a barium enema during the procedure, bleeding, infection, peritonitis and/or damage to adjacent structures.  All of the father's questions were answered, patient is agreeable to proceed.  Consent signed and in IR binder.   Will need to hold tube feeding at midnight of the procedure.  Patient to have oral contrast the day before the procedure, and an abdominal x-ray in the morning of the day of the procedure per Dr. Laurence Ferrari.    The procedure is tentatively scheduled for next week pending KUB result and IR schedule.    Thank you for this interesting consult.  I greatly enjoyed meeting Jeffery Andrews and look forward to participating in their care.  A copy of this report was sent to the requesting provider on this date.  Electronically Signed: Tera Mater, PA-C 07/25/2020, 10:37 AM   I spent a total of 40 Minutes    in face to face in clinical consultation, greater than 50% of which was counseling/coordinating care for gastrostomy tube placement

## 2020-07-27 DIAGNOSIS — J9621 Acute and chronic respiratory failure with hypoxia: Secondary | ICD-10-CM | POA: Diagnosis not present

## 2020-07-27 DIAGNOSIS — N186 End stage renal disease: Secondary | ICD-10-CM | POA: Diagnosis not present

## 2020-07-27 DIAGNOSIS — I5032 Chronic diastolic (congestive) heart failure: Secondary | ICD-10-CM | POA: Diagnosis not present

## 2020-07-27 DIAGNOSIS — J181 Lobar pneumonia, unspecified organism: Secondary | ICD-10-CM | POA: Diagnosis not present

## 2020-07-27 LAB — HEMOGLOBIN AND HEMATOCRIT, BLOOD
HCT: 25 % — ABNORMAL LOW (ref 39.0–52.0)
Hemoglobin: 7.6 g/dL — ABNORMAL LOW (ref 13.0–17.0)

## 2020-07-27 NOTE — Progress Notes (Signed)
[facility] [place]  PROGRESS NOTE     LARONN CARITHERS  [id number]  DOB: Aug 28, 1975   DOA: 07/17/2020  Referring Physician: [name], MD  HPI: GRIFFON LEYENDECKER is a 45 y.o. male being followed for ventilator/airway/oxygen weaning Acute on Chronic Respiratory Failure.  Patient is resting comfortably on T collar liberated from the ventilator right now is on room air minimal secretions reportedly  Medications: Reviewed on Rounds  Physical Exam:  Vitals: Temperature 97.1 pulse 61 respiratory rate is 13 blood pressure is 160/84 saturations 99%  Ventilator Settings on T collar with room air  General: Comfortable at this time Neck: supple Cardiovascular: no malignant arrhythmias Respiratory: No rhonchi no rales noted at this time Skin: no rash seen on limited exam Musculoskeletal: No gross abnormality Psychiatric:unable to assess Neurologic:no involuntary movements         Lab Data:   Basic Metabolic Panel: Recent Labs  Lab 07/21/20 0815 07/23/20 0658 07/25/20 0806  NA 128* 130* 128*  K 4.9 4.9 4.6  CL 93* 91* 92*  CO2 '28 26 27  '$ GLUCOSE 166* 116* 156*  BUN 100* 96* 101*  CREATININE 5.70* 5.20* 4.98*  CALCIUM 8.5* 8.8* 8.9  PHOS 4.2 3.6 3.7    ABG: Recent Labs  Lab 07/21/20 1035  PHART 7.437  PCO2ART 37.4  PO2ART 115*  HCO3 24.8  O2SAT 98.4    Liver Function Tests: Recent Labs  Lab 07/21/20 0815 07/23/20 0658 07/25/20 0806  ALBUMIN 1.8* 1.9* 1.8*   No results for input(s): LIPASE, AMYLASE in the last 168 hours. No results for input(s): AMMONIA in the last 168 hours.  CBC: Recent Labs  Lab 07/21/20 0815 07/23/20 0658 07/25/20 0806 07/27/20 1125  WBC 16.3* 16.1* 15.4*  --   HGB 8.6* 8.1* 7.3* 7.6*  HCT 27.7* 25.7* 23.2* 25.0*  MCV 74.7* 74.7* 75.8*  --   PLT 421* 414* 400  --     Cardiac Enzymes: No results for input(s): CKTOTAL, CKMB, CKMBINDEX,  TROPONINI in the last 168 hours.  BNP (last 3 results) No results for input(s): BNP in the last 8760 hours.  ProBNP (last 3 results) No results for input(s): PROBNP in the last 8760 hours.  Radiological Exams: No results found.  Assessment/Plan Active Problems:   Acute on chronic respiratory failure with hypoxia (HCC)   End stage renal disease on dialysis (HCC)   Lobar pneumonia, unspecified organism (HCC)   Chronic diastolic CHF (congestive heart failure) (HCC)   Acute on chronic respiratory failure with hypoxia plan is to continue with the T collar titrate oxygen as tolerated continue pulmonary toilet. End-stage renal failure patient is on hemodialysis continue to monitor closely Lobar pneumonia treated slow improvement Chronic diastolic heart failure is compensated at this time   I have personally seen and evaluated the patient, evaluated laboratory and imaging results, formulated the assessment and plan and placed orders. The Patient requires high complexity decision making with multiple systems involvement.  Rounds were done with the Respiratory Therapy Director and Staff therapists and discussed with nursing staff also.  Allyne Gee, MD [facility] Pulmonary Critical Care Medicine Sleep Medicine

## 2020-07-27 NOTE — Progress Notes (Signed)
[facility]  ROUNDING NOTE   Subjective:  Patient seen at bedside this AM. Blood pressure currently 160/84. Due for dialysis treatment again tomorrow.  Objective:  Vital signs in last 24 hours:  Temperature 97.1 pulse 61 respirations 13 blood pressure 160/84   Physical Exam: General:  Critically ill-appearing  Head:  Normocephalic, atraumatic.    Eyes:  Anicteric  Neck:  Tracheostomy in place  Lungs:   Clear to auscultation, vent assisted  Heart:  S1S2 no rubs  Abdomen:   Soft, nontender, bowel sounds present  Extremities:  Trace peripheral edema.  Neurologic:  Eyes open, not following commands  Skin:  No acute skin rash  Access:  Left upper extremity AV fistula    Basic Metabolic Panel: Recent Labs  Lab 07/21/20 0815 07/23/20 0658 07/25/20 0806  NA 128* 130* 128*  K 4.9 4.9 4.6  CL 93* 91* 92*  CO2 '28 26 27  '$ GLUCOSE 166* 116* 156*  BUN 100* 96* 101*  CREATININE 5.70* 5.20* 4.98*  CALCIUM 8.5* 8.8* 8.9  PHOS 4.2 3.6 3.7     Liver Function Tests: Recent Labs  Lab 07/21/20 0815 07/23/20 0658 07/25/20 0806  ALBUMIN 1.8* 1.9* 1.8*    No results for input(s): LIPASE, AMYLASE in the last 168 hours. No results for input(s): AMMONIA in the last 168 hours.  CBC: Recent Labs  Lab 07/21/20 0815 07/23/20 0658 07/25/20 0806  WBC 16.3* 16.1* 15.4*  HGB 8.6* 8.1* 7.3*  HCT 27.7* 25.7* 23.2*  MCV 74.7* 74.7* 75.8*  PLT 421* 414* 400     Cardiac Enzymes: No results for input(s): CKTOTAL, CKMB, CKMBINDEX, TROPONINI in the last 168 hours.  BNP: Invalid input(s): POCBNP  CBG: No results for input(s): GLUCAP in the last 168 hours.  Microbiology: Results for orders placed or performed during the hospital encounter of 06/29/20  Culture, Respiratory w Gram Stain     Status: None   Collection Time: 06/30/20  4:00 PM   Specimen: Tracheal Aspirate; Respiratory  Result Value Ref Range Status   Specimen Description TRACHEAL ASPIRATE  Final    Special Requests NONE  Final   Gram Stain   Final    FEW WBC PRESENT,BOTH PMN AND MONONUCLEAR MODERATE GRAM NEGATIVE COCCOBACILLI RARE GRAM POSITIVE COCCI IN PAIRS FEW GRAM POSITIVE RODS Performed at [facility], [place] [address], [place], [place] [address]    Culture   Final    ABUNDANT HAEMOPHILUS INFLUENZAE BETA LACTAMASE NEGATIVE FEW STAPHYLOCOCCUS AUREUS    Report Status 07/04/2020 FINAL  Final   Organism ID, Bacteria STAPHYLOCOCCUS AUREUS  Final      Susceptibility   Staphylococcus aureus - MIC*    CIPROFLOXACIN <=0.5 SENSITIVE Sensitive     ERYTHROMYCIN >=8 RESISTANT Resistant     GENTAMICIN <=0.5 SENSITIVE Sensitive     OXACILLIN <=0.25 SENSITIVE Sensitive     TETRACYCLINE <=1 SENSITIVE Sensitive     VANCOMYCIN <=0.5 SENSITIVE Sensitive     TRIMETH/SULFA <=10 SENSITIVE Sensitive     CLINDAMYCIN <=0.25 SENSITIVE Sensitive     RIFAMPIN <=0.5 SENSITIVE Sensitive     Inducible Clindamycin NEGATIVE Sensitive     * FEW STAPHYLOCOCCUS AUREUS  Culture, blood (routine x 2)     Status: Abnormal   Collection Time: 07/08/20  7:12 PM   Specimen: BLOOD  Result Value Ref Range Status   Specimen Description BLOOD SITE NOT SPECIFIED  Final   Special Requests   Final    BOTTLES DRAWN AEROBIC AND ANAEROBIC Blood  Culture adequate volume   Culture  Setup Time   Final    GRAM POSITIVE COCCI IN CLUSTERS AEROBIC BOTTLE ONLY CRITICAL VALUE NOTED.  VALUE IS CONSISTENT WITH PREVIOUSLY REPORTED AND CALLED VALUE.    Culture (A)  Final    STAPHYLOCOCCUS HOMINIS THE SIGNIFICANCE OF ISOLATING THIS ORGANISM FROM A SINGLE SET OF BLOOD CULTURES WHEN MULTIPLE SETS ARE DRAWN IS UNCERTAIN. PLEASE NOTIFY THE MICROBIOLOGY DEPARTMENT WITHIN ONE WEEK IF SPECIATION AND SENSITIVITIES ARE REQUIRED. Performed at [facility], [place] [address], [place], [place] [address]    Report Status 07/13/2020 FINAL  Final  Culture, blood (routine x 2)     Status: Abnormal   Collection Time: 07/08/20  7:12 PM    Specimen: BLOOD  Result Value Ref Range Status   Specimen Description BLOOD SITE NOT SPECIFIED  Final   Special Requests   Final    BOTTLES DRAWN AEROBIC AND ANAEROBIC Blood Culture results may not be optimal due to an inadequate volume of blood received in culture bottles   Culture  Setup Time   Final    GRAM POSITIVE COCCI IN CLUSTERS AEROBIC BOTTLE ONLY Organism ID to follow CRITICAL RESULT CALLED TO, READ BACK BY AND VERIFIED WITH: Mirian Mo RN, 0132 07/10/20 D.VANHOOK    Culture (A)  Final    STAPHYLOCOCCUS EPIDERMIDIS THE SIGNIFICANCE OF ISOLATING THIS ORGANISM FROM A SINGLE SET OF BLOOD CULTURES WHEN MULTIPLE SETS ARE DRAWN IS UNCERTAIN. PLEASE NOTIFY THE MICROBIOLOGY DEPARTMENT WITHIN ONE WEEK IF SPECIATION AND SENSITIVITIES ARE REQUIRED. Performed at [facility], [place] [address] [place], [place] [address]    Report Status 07/10/2020 FINAL  Final  Blood Culture ID Panel (Reflexed)     Status: Abnormal   Collection Time: 07/08/20  7:12 PM  Result Value Ref Range Status   Enterococcus faecalis NOT DETECTED NOT DETECTED Final   Enterococcus Faecium NOT DETECTED NOT DETECTED Final   Listeria monocytogenes NOT DETECTED NOT DETECTED Final   Staphylococcus species DETECTED (A) NOT DETECTED Final    Comment: CRITICAL RESULT CALLED TO, READ BACK BY AND VERIFIED WITH: Mirian Mo RN, 0132 07/10/20 D.VANHOOK    Staphylococcus aureus (BCID) NOT DETECTED NOT DETECTED Final   Staphylococcus epidermidis DETECTED (A) NOT DETECTED Final    Comment: Methicillin (oxacillin) resistant coagulase negative staphylococcus. Possible blood culture contaminant (unless isolated from more than one blood culture draw or clinical case suggests pathogenicity). No antibiotic treatment is indicated for blood  culture contaminants. CRITICAL RESULT CALLED TO, READ BACK BY AND VERIFIED WITH: Mirian Mo RN, 0132 07/10/20 D.VANHOOK    Staphylococcus lugdunensis NOT DETECTED NOT DETECTED Final   Streptococcus  species NOT DETECTED NOT DETECTED Final   Streptococcus agalactiae NOT DETECTED NOT DETECTED Final   Streptococcus pneumoniae NOT DETECTED NOT DETECTED Final   Streptococcus pyogenes NOT DETECTED NOT DETECTED Final   A.calcoaceticus-baumannii NOT DETECTED NOT DETECTED Final   Bacteroides fragilis NOT DETECTED NOT DETECTED Final   Enterobacterales NOT DETECTED NOT DETECTED Final   Enterobacter cloacae complex NOT DETECTED NOT DETECTED Final   Escherichia coli NOT DETECTED NOT DETECTED Final   Klebsiella aerogenes NOT DETECTED NOT DETECTED Final   Klebsiella oxytoca NOT DETECTED NOT DETECTED Final   Klebsiella pneumoniae NOT DETECTED NOT DETECTED Final   Proteus species NOT DETECTED NOT DETECTED Final   Salmonella species NOT DETECTED NOT DETECTED Final   Serratia marcescens NOT DETECTED NOT DETECTED Final   Haemophilus influenzae NOT DETECTED NOT DETECTED Final   Neisseria meningitidis  NOT DETECTED NOT DETECTED Final   Pseudomonas aeruginosa NOT DETECTED NOT DETECTED Final   Stenotrophomonas maltophilia NOT DETECTED NOT DETECTED Final   Candida albicans NOT DETECTED NOT DETECTED Final   Candida auris NOT DETECTED NOT DETECTED Final   Candida glabrata NOT DETECTED NOT DETECTED Final   Candida krusei NOT DETECTED NOT DETECTED Final   Candida parapsilosis NOT DETECTED NOT DETECTED Final   Candida tropicalis NOT DETECTED NOT DETECTED Final   Cryptococcus neoformans/gattii NOT DETECTED NOT DETECTED Final   Methicillin resistance mecA/C DETECTED (A) NOT DETECTED Final    Comment: Performed at [facility], [place] [address], [place], [place] [address]    Coagulation Studies: Recent Labs    07/25/20 1053  LABPROT 14.2  INR 1.1    Urinalysis: No results for input(s): COLORURINE, LABSPEC, PHURINE, GLUCOSEU, HGBUR, BILIRUBINUR, KETONESUR, PROTEINUR, UROBILINOGEN, NITRITE, LEUKOCYTESUR in the last 72 hours.  Invalid input(s): APPERANCEUR    Imaging: No results  found.   Medications:       Assessment/ Plan:  45 y.o. male with a PMHx of ESRD on HD, posterior reversible encephalopathy syndrome, diabetes mellitus type 2, chronic systolic heart failure, anemia of chronic kidney disease, secondary hyperparathyroidism, malignant hypertension, acute respiratory failure, who was admitted to [facility] on 06/29/2020 for ongoing treatment of acute respiratory failure, posterior reversible encephalopathy syndrome, and end-stage renal disease.   1.  ESRD on HD.  No acute indication for dialysis today.  We will plan for hemodialysis treatment again tomorrow.  2.  Anemia of chronic kidney disease.  Hemoglobin drifted down to 7.3.  Continue Retacrit 4000 units IV with dialysis.  Consider blood transfusion for hemoglobin of 7 or less.  3.  Secondary hyperparathyroidism.  Phosphorus 3.7 and acceptable.  4.  Acute respiratory failure.  Tracheostomy placed 07/17/2020.  Patient breathing comfortably through tracheostomy.  5.  Hypertension.  Maintain the patient amlodipine, clonidine, hydralazine, labetalol, and losartan.  Blood pressure this a.m. 160/84.  6.  Bacteremia.  Gram-positive cocci in clusters noted previously. Line management and antibiotic management per hospitalist.    LOS: 0 Hermann Dottavio 7/25/20228:38 AM

## 2020-07-28 ENCOUNTER — Other Ambulatory Visit (HOSPITAL_COMMUNITY): Payer: Medicare Other

## 2020-07-28 ENCOUNTER — Encounter (HOSPITAL_COMMUNITY): Payer: Self-pay | Admitting: Interventional Radiology

## 2020-07-28 DIAGNOSIS — J9621 Acute and chronic respiratory failure with hypoxia: Secondary | ICD-10-CM | POA: Diagnosis not present

## 2020-07-28 DIAGNOSIS — I5032 Chronic diastolic (congestive) heart failure: Secondary | ICD-10-CM | POA: Diagnosis not present

## 2020-07-28 DIAGNOSIS — J181 Lobar pneumonia, unspecified organism: Secondary | ICD-10-CM | POA: Diagnosis not present

## 2020-07-28 DIAGNOSIS — N186 End stage renal disease: Secondary | ICD-10-CM | POA: Diagnosis not present

## 2020-07-28 HISTORY — PX: IR GASTROSTOMY TUBE MOD SED: IMG625

## 2020-07-28 LAB — RENAL FUNCTION PANEL
Albumin: 1.8 g/dL — ABNORMAL LOW (ref 3.5–5.0)
Anion gap: 13 (ref 5–15)
BUN: 123 mg/dL — ABNORMAL HIGH (ref 6–20)
CO2: 27 mmol/L (ref 22–32)
Calcium: 8.9 mg/dL (ref 8.9–10.3)
Chloride: 89 mmol/L — ABNORMAL LOW (ref 98–111)
Creatinine, Ser: 5.58 mg/dL — ABNORMAL HIGH (ref 0.61–1.24)
GFR, Estimated: 12 mL/min — ABNORMAL LOW (ref >60)
Glucose, Bld: 163 mg/dL — ABNORMAL HIGH (ref 70–99)
Phosphorus: 2.8 mg/dL (ref 2.5–4.6)
Potassium: 4.4 mmol/L (ref 3.5–5.1)
Sodium: 129 mmol/L — ABNORMAL LOW (ref 135–145)

## 2020-07-28 LAB — CBC
HCT: 23.1 % — ABNORMAL LOW (ref 39.0–52.0)
Hemoglobin: 7.3 g/dL — ABNORMAL LOW (ref 13.0–17.0)
MCH: 23.4 pg — ABNORMAL LOW (ref 26.0–34.0)
MCHC: 31.6 g/dL (ref 30.0–36.0)
MCV: 74 fL — ABNORMAL LOW (ref 80.0–100.0)
Platelets: 388 10*3/uL (ref 150–400)
RBC: 3.12 MIL/uL — ABNORMAL LOW (ref 4.22–5.81)
RDW: 18.2 % — ABNORMAL HIGH (ref 11.5–15.5)
WBC: 13.4 10*3/uL — ABNORMAL HIGH (ref 4.0–10.5)
nRBC: 0 % (ref 0.0–0.2)

## 2020-07-28 MED ORDER — MIDAZOLAM HCL 2 MG/2ML IJ SOLN
INTRAMUSCULAR | Status: AC | PRN
  Administered 2020-07-28: 1 mg via INTRAVENOUS

## 2020-07-28 MED ORDER — IOHEXOL 300 MG/ML  SOLN
50.0000 mL | Freq: Once | INTRAMUSCULAR | Status: AC | PRN
  Administered 2020-07-28: 20 mL

## 2020-07-28 MED ORDER — CEFAZOLIN SODIUM-DEXTROSE 2-4 GM/100ML-% IV SOLN
INTRAVENOUS | Status: AC
  Filled 2020-07-28: qty 100

## 2020-07-28 MED ORDER — GLUCAGON HCL RDNA (DIAGNOSTIC) 1 MG IJ SOLR
INTRAMUSCULAR | Status: AC | PRN
  Administered 2020-07-28: .5 mg via INTRAVENOUS

## 2020-07-28 MED ORDER — MIDAZOLAM HCL 2 MG/2ML IJ SOLN
INTRAMUSCULAR | Status: AC
  Filled 2020-07-28: qty 2

## 2020-07-28 MED ORDER — LIDOCAINE HCL 1 % IJ SOLN
INTRAMUSCULAR | Status: AC
  Filled 2020-07-28: qty 20

## 2020-07-28 MED ORDER — CEFAZOLIN SODIUM-DEXTROSE 2-4 GM/100ML-% IV SOLN
INTRAVENOUS | Status: AC | PRN
  Administered 2020-07-28: 2 g via INTRAVENOUS

## 2020-07-28 MED ORDER — FENTANYL CITRATE (PF) 100 MCG/2ML IJ SOLN
INTRAMUSCULAR | Status: AC
  Filled 2020-07-28: qty 2

## 2020-07-28 MED ORDER — FENTANYL CITRATE (PF) 100 MCG/2ML IJ SOLN
INTRAMUSCULAR | Status: AC | PRN
  Administered 2020-07-28: 25 ug via INTRAVENOUS

## 2020-07-28 MED ORDER — GLUCAGON HCL RDNA (DIAGNOSTIC) 1 MG IJ SOLR
INTRAMUSCULAR | Status: AC
  Filled 2020-07-28: qty 1

## 2020-07-28 NOTE — Progress Notes (Signed)
[facility]  PROGRESS NOTE     HARUKI BURDICK  [id number]  DOB: January 14, 1975   DOA: 07/17/2020  Referring Physician: [name], MD  HPI: RAJVIR BYRDSONG is a 45 y.o. male being followed for ventilator/airway/oxygen weaning Acute on Chronic Respiratory Failure.  Patient is afebrile right now resting comfortably without distress  Medications: Reviewed on Rounds  Physical Exam:  Vitals: Temperature is 97.9 pulse 83 respiratory 18 blood pressure is 190/94 saturations 100%  Ventilator Settings off the ventilator on room air on T collar  General: Comfortable at this time Neck: supple Cardiovascular: no malignant arrhythmias Respiratory: No rhonchi no rales are noted at this time Skin: no rash seen on limited exam Musculoskeletal: No gross abnormality Psychiatric:unable to assess Neurologic:no involuntary movements         Lab Data:   Basic Metabolic Panel: Recent Labs  Lab 07/23/20 0658 07/25/20 0806  NA 130* 128*  K 4.9 4.6  CL 91* 92*  CO2 26 27  GLUCOSE 116* 156*  BUN 96* 101*  CREATININE 5.20* 4.98*  CALCIUM 8.8* 8.9  PHOS 3.6 3.7    ABG: Recent Labs  Lab 07/21/20 1035  PHART 7.437  PCO2ART 37.4  PO2ART 115*  HCO3 24.8  O2SAT 98.4    Liver Function Tests: Recent Labs  Lab 07/23/20 0658 07/25/20 0806  ALBUMIN 1.9* 1.8*   No results for input(s): LIPASE, AMYLASE in the last 168 hours. No results for input(s): AMMONIA in the last 168 hours.  CBC: Recent Labs  Lab 07/23/20 0658 07/25/20 0806 07/27/20 1125 07/28/20 0800  WBC 16.1* 15.4*  --  13.4*  HGB 8.1* 7.3* 7.6* 7.3*  HCT 25.7* 23.2* 25.0* 23.1*  MCV 74.7* 75.8*  --  74.0*  PLT 414* 400  --  388    Cardiac Enzymes: No results for input(s): CKTOTAL, CKMB, CKMBINDEX, TROPONINI in the last 168 hours.  BNP (last 3 results) No results for input(s): BNP in the last 8760 hours.  ProBNP  (last 3 results) No results for input(s): PROBNP in the last 8760 hours.  Radiological Exams: DG Abd Portable 1V  Result Date: 07/28/2020 CLINICAL DATA:  45 year old male status post PO barium for gastrostomy tube placement. EXAM: PORTABLE ABDOMEN - 1 VIEW COMPARISON:  CT Abdomen and Pelvis 07/21/2020 and earlier. FINDINGS: Portable AP supine view at 0600 hours. Enteric tube remains in place and loops in the gastric body. Barium contrast is present from the terminal ileum to the splenic flexure, with good opacification of the transverse colon. Non obstructed bowel gas pattern. No acute osseous abnormality identified. IMPRESSION: Good barium opacification of the transverse colon for gastrostomy placement. Electronically Signed   By: Genevie Ann M.D.   On: 07/28/2020 06:17    Assessment/Plan Active Problems:   Acute on chronic respiratory failure with hypoxia (HCC)   End stage renal disease on dialysis (HCC)   Lobar pneumonia, unspecified organism (HCC)   Chronic diastolic CHF (congestive heart failure) (HCC)   Acute on chronic respiratory failure hypoxia plan is going to be continue with the T collar patient has been successfully liberated from the ventilator End-stage renal failure on hemodialysis Lobar pneumonia treated we will continue to follow Chronic diastolic heart failure no change compensated   I have personally seen and evaluated the patient, evaluated laboratory and imaging results, formulated the assessment and plan and placed orders. The Patient requires high complexity decision making with multiple systems involvement.  Rounds were done with the Respiratory Therapy Director and Staff therapists and discussed with nursing staff also.  Allyne Gee, MD [facility] Pulmonary Critical Care Medicine Sleep Medicine

## 2020-07-28 NOTE — Procedures (Signed)
Vascular and Interventional Radiology Procedure Note  Patient: Jeffery Andrews DOB: [birth year] Medical Record Number: [id number] Note Date/Time: 07/28/20 2:26 PM   Performing Physician: Michaelle Birks, MD Assistant(s): None  Diagnosis: Dysphagia   Procedure: PERCUTANEOUS GASTROSTOMY TUBE PLACEMENT  Anesthesia: Conscious Sedation Complications: None Estimated Blood Loss: Minimal  Findings:  Successful placement of a  78 F MIC gastrostomy tube under fluoroscopy.   See detailed procedure note with images in PACS. The patient tolerated the procedure well without incident or complication and was returned to Floor Bed in stable condition.    Michaelle Birks, MD [facility] Radiology   Pager. [place]

## 2020-07-29 DIAGNOSIS — N186 End stage renal disease: Secondary | ICD-10-CM | POA: Diagnosis not present

## 2020-07-29 DIAGNOSIS — J181 Lobar pneumonia, unspecified organism: Secondary | ICD-10-CM | POA: Diagnosis not present

## 2020-07-29 DIAGNOSIS — I5032 Chronic diastolic (congestive) heart failure: Secondary | ICD-10-CM | POA: Diagnosis not present

## 2020-07-29 DIAGNOSIS — J9621 Acute and chronic respiratory failure with hypoxia: Secondary | ICD-10-CM | POA: Diagnosis not present

## 2020-07-29 NOTE — Procedures (Addendum)
Jeffery Andrews is a 45 y.o. male s/p image guided gastrostomy tube placement with Dr. Maryelizabeth Kaufmann yesterday.   G tube to wall suction.  G tube site appears clean, dry, no bleeding, no sign of acute infection.  Has T-tacs   Ok to start using the gastrostomy tube around 2:30 pm today.   IR APP will remove the T-tacs after 8/2.  Please call IR for questions and concerns regarding the G tube.   Armando Gang Kellianne Ek PA-C 07/29/2020 10:38 AM

## 2020-07-29 NOTE — Progress Notes (Signed)
[facility]  ROUNDING NOTE   Subjective:  Patient laying in bed. Has been weaned from the ventilator. 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  Objective:  Vital signs in last 24 hours:  Temperature 98.4 pulse 76 respirations 14 blood pressure 156/86   Physical Exam: General:  Critically ill-appearing  Head:  Normocephalic, atraumatic.    Eyes:  Anicteric  Neck:  Tracheostomy in place  Lungs:   Clear to auscultation, vent assisted  Heart:  S1S2 no rubs  Abdomen:   Soft, nontender, bowel sounds present  Extremities:  Trace peripheral edema.  Neurologic:  Eyes open, not following commands, bilateral hands with contractures  Skin:  No acute skin rash  Access:  Left upper extremity AV fistula    Basic Metabolic Panel: Recent Labs  Lab 07/23/20 0658 07/25/20 0806 07/28/20 0800  NA 130* 128* 129*  K 4.9 4.6 4.4  CL 91* 92* 89*  CO2 '26 27 27  '$ GLUCOSE 116* 156* 163*  BUN 96* 101* 123*  CREATININE 5.20* 4.98* 5.58*  CALCIUM 8.8* 8.9 8.9  PHOS 3.6 3.7 2.8     Liver Function Tests: Recent Labs  Lab 07/23/20 0658 07/25/20 0806 07/28/20 0800  ALBUMIN 1.9* 1.8* 1.8*    No results for input(s): LIPASE, AMYLASE in the last 168 hours. No results for input(s): AMMONIA in the last 168 hours.  CBC: Recent Labs  Lab 07/23/20 0658 07/25/20 0806 07/27/20 1125 07/28/20 0800  WBC 16.1* 15.4*  --  13.4*  HGB 8.1* 7.3* 7.6* 7.3*  HCT 25.7* 23.2* 25.0* 23.1*  MCV 74.7* 75.8*  --  74.0*  PLT 414* 400  --  388     Cardiac Enzymes: No results for input(s): CKTOTAL, CKMB, CKMBINDEX, TROPONINI in the last 168 hours.  BNP: Invalid input(s): POCBNP  CBG: No results for input(s): GLUCAP in the last 168 hours.  Microbiology: Results for orders placed or performed during the hospital encounter of 07/17/20  Culture, blood (routine x 2)     Status: None (Preliminary result)   Collection Time: 07/27/20 11:17 AM   Specimen: BLOOD RIGHT ARM  Result Value Ref Range  Status   Specimen Description BLOOD RIGHT ARM  Final   Special Requests   Final    BOTTLES DRAWN AEROBIC ONLY Blood Culture results may not be optimal due to an inadequate volume of blood received in culture bottles   Culture   Final    NO GROWTH < 24 HOURS Performed at [facility], [place] [address] [place], [place] [address]    Report Status PENDING  Incomplete  Culture, blood (routine x 2)     Status: None (Preliminary result)   Collection Time: 07/27/20 11:23 AM   Specimen: BLOOD RIGHT HAND  Result Value Ref Range Status   Specimen Description BLOOD RIGHT HAND  Final   Special Requests   Final    BOTTLES DRAWN AEROBIC ONLY Blood Culture results may not be optimal due to an inadequate volume of blood received in culture bottles   Culture   Final    NO GROWTH < 24 HOURS Performed at [facility], [place] [address] [place], [place] [address]    Report Status PENDING  Incomplete    Coagulation Studies: No results for input(s): LABPROT, INR in the last 72 hours.   Urinalysis: No results for input(s): COLORURINE, LABSPEC, PHURINE, GLUCOSEU, HGBUR, BILIRUBINUR, KETONESUR, PROTEINUR, UROBILINOGEN, NITRITE, LEUKOCYTESUR in the last 72 hours.  Invalid input(s): APPERANCEUR    Imaging: IR  GASTROSTOMY TUBE MOD SED  Result Date: 07/29/2020 INDICATION: Dysphagia. EXAM: PERCUTANEOUS GASTROSTOMY TUBE PLACEMENT MEDICATIONS: Ancef 2 gm IV; Antibiotics were administered within 1 hour of the procedure. Glucagon 0.5 Mg IV ANESTHESIA/SEDATION: Versed 1 mg IV; Fentanyl 25 mcg IV Moderate Sedation Time:  23 The patient was continuously monitored during the procedure by the interventional radiology nurse under my direct supervision. CONTRAST:  20 mL OMNIPAQUE IOHEXOL 300 MG/ML SOLN - administered into the gastric lumen. FLUOROSCOPY TIME:  Fluoroscopy Time: 0 minutes 18 seconds (1 mGy). COMPLICATIONS: None immediate. PROCEDURE: Informed written consent was obtained from the patient and the  patient's family after a thorough discussion of the procedural risks, benefits and alternatives. All questions were addressed. Maximal Sterile Barrier Technique was utilized including caps, mask, sterile gowns, sterile gloves, sterile drape, hand hygiene and skin antiseptic. A timeout was performed prior to the initiation of the procedure. The epigastrium was prepped with Betadine in a sterile fashion, and a sterile drape was applied covering the operative field. A sterile gown and sterile gloves were used for the procedure. The night before the procedure, the patient received barium in order to be able to identify the transverse colon under fluoroscopy. PERCUTANEOUS GASTROSTOMY TUBE PLACEMENT: Ultrasound was used to identify the inferior/left lateral margin of the liver and its relationship to the stomach. A 5-F Kumpe catheter was advanced over a 0.035-inch Bentson guidewire and placed in the stomach under fluoroscopy. The upper abdomen was prepped and draped using all elements of maximal sterile barrier technique. After inflating the stomach with air, the gastric body was identified by fluoroscopy and appropriate cutaneous entry sites on the anterior abdominal wall were selected. Local anesthesia was provided with 1% lidocaine with epinephrine. Through an 18-G needle, 2 metal T-fasteners were placed through the anterior abdominal wall into the gastric body lumen under fluoroscopy. Once the gastric wall was tacked to the anterior abdominal wall, a stab incision was made using a #11 blade between the two T-fasteners. An 18-G needle was then placed through the incision and into the stomach followed by an 0.035 inch guidewire. The guidewire was coiled in the gastric fundus. The needle was removed and the track was dilated with a 18-F telescoping dilator/peel-away sheath. Over the 0.035 inch guidewire, through the peel-away sheath, a 18-F MIC gastrostomy tube was inserted into the gastric lumen. Contrast injection  confirmed proper positioning of the gastrostomy tube within the gastric lumen. The intraluminal balloon was insufflated with dilute contrast and the tube was withdrawn until the balloon was flush against the stomach wall. The outer disc was then lowered to be flush with the skin, securing the tube in place. IMPRESSION: Successful placement of a percutaneous 68 French MIC gastrostomy tube under fluoroscopy. Michaelle Birks, MD [facility]iology [facility] Radiology Electronically Signed   By: Michaelle Birks MD   On: 07/29/2020 07:07   DG Abd Portable 1V  Result Date: 07/28/2020 CLINICAL DATA:  45 year old male status post PO barium for gastrostomy tube placement. EXAM: PORTABLE ABDOMEN - 1 VIEW COMPARISON:  CT Abdomen and Pelvis 07/21/2020 and earlier. FINDINGS: Portable AP supine view at 0600 hours. Enteric tube remains in place and loops in the gastric body. Barium contrast is present from the terminal ileum to the splenic flexure, with good opacification of the transverse colon. Non obstructed bowel gas pattern. No acute osseous abnormality identified. IMPRESSION: Good barium opacification of the transverse colon for gastrostomy placement. Electronically Signed   By: Genevie Ann M.D.   On:  07/28/2020 06:17     Medications:       Assessment/ Plan:  45 y.o. male with a PMHx of ESRD on HD, posterior reversible encephalopathy syndrome, diabetes mellitus type 2, chronic systolic heart failure, anemia of chronic kidney disease, secondary hyperparathyroidism, malignant hypertension, acute respiratory failure, who was admitted to [facility] on 06/29/2020 for ongoing treatment of acute respiratory failure, posterior reversible encephalopathy syndrome, and end-stage renal disease.   1.  ESRD on HD.  We will plan for hemodialysis treatment again tomorrow.  2.  Anemia of chronic kidney disease.  Hemoglobin remains low at 7.3.  Consider blood transfusion for hemoglobin of 7 or less.   Otherwise maintain the patient on Retacrit.  3.  Secondary hyperparathyroidism.  Phosphorus currently 2.8 and acceptable.  4.  Acute respiratory failure.  Tracheostomy placed 07/17/2020.  Patient now weaned from the ventilator and respiratory status appears to be stable.  5.  Hypertension.  Maintain the patient amlodipine, clonidine, hydralazine, labetalol, and losartan.  Blood pressure improved this a.m. at 156/86.  6.  Bacteremia.  Gram-positive cocci in clusters noted previously. Line management and antibiotic management per hospitalist.    LOS: 0 Jeffery Andrews 7/27/20228:28 AM

## 2020-07-29 NOTE — Progress Notes (Signed)
Pulmonary [place]  PROGRESS NOTE     Jeffery Andrews  [id number]  DOB: [birth year]   DOA: 07/17/2020  Referring Physician: [name], MD  HPI: Jeffery Andrews is a 45 y.o. male being followed for ventilator/airway/oxygen weaning Acute on Chronic Respiratory Failure.  Patient is comfortable right now without distress has been afebrile remains off the ventilator on T collar has been using the PMV  Medications: Reviewed on Rounds  Physical Exam:  Vitals: Temperature is 98.4 pulse 76 respiratory 14 blood pressure is 156/86 saturations 99%  Ventilator Settings currently off the ventilator on T collar room air  General: Comfortable at this time Neck: supple Cardiovascular: no malignant arrhythmias Respiratory: No rhonchi no rales are noted at this time Skin: no rash seen on limited exam Musculoskeletal: No gross abnormality Psychiatric:unable to assess Neurologic:no involuntary movements         Lab Data:   Basic Metabolic Panel: Recent Labs  Lab 07/23/20 0658 07/25/20 0806 07/28/20 0800  NA 130* 128* 129*  K 4.9 4.6 4.4  CL 91* 92* 89*  CO2 '26 27 27  '$ GLUCOSE 116* 156* 163*  BUN 96* 101* 123*  CREATININE 5.20* 4.98* 5.58*  CALCIUM 8.8* 8.9 8.9  PHOS 3.6 3.7 2.8    ABG: No results for input(s): PHART, PCO2ART, PO2ART, HCO3, O2SAT in the last 168 hours.  Liver Function Tests: Recent Labs  Lab 07/23/20 0658 07/25/20 0806 07/28/20 0800  ALBUMIN 1.9* 1.8* 1.8*   No results for input(s): LIPASE, AMYLASE in the last 168 hours. No results for input(s): AMMONIA in the last 168 hours.  CBC: Recent Labs  Lab 07/23/20 0658 07/25/20 0806 07/27/20 1125 07/28/20 0800  WBC 16.1* 15.4*  --  13.4*  HGB 8.1* 7.3* 7.6* 7.3*  HCT 25.7* 23.2* 25.0* 23.1*  MCV 74.7* 75.8*  --  74.0*  PLT 414* 400  --  388    Cardiac Enzymes: No results for input(s): CKTOTAL, CKMB, CKMBINDEX,  TROPONINI in the last 168 hours.  BNP (last 3 results) No results for input(s): BNP in the last 8760 hours.  ProBNP (last 3 results) No results for input(s): PROBNP in the last 8760 hours.  Radiological Exams: IR GASTROSTOMY TUBE MOD SED  Result Date: 07/29/2020 INDICATION: Dysphagia. EXAM: PERCUTANEOUS GASTROSTOMY TUBE PLACEMENT MEDICATIONS: Ancef 2 gm IV; Antibiotics were administered within 1 hour of the procedure. Glucagon 0.5 Mg IV ANESTHESIA/SEDATION: Versed 1 mg IV; Fentanyl 25 mcg IV Moderate Sedation Time:  23 The patient was continuously monitored during the procedure by the interventional radiology nurse under my direct supervision. CONTRAST:  20 mL OMNIPAQUE IOHEXOL 300 MG/ML SOLN - administered into the gastric lumen. FLUOROSCOPY TIME:  Fluoroscopy Time: 0 minutes 18 seconds (1 mGy). COMPLICATIONS: None immediate. PROCEDURE: Informed written consent was obtained from the patient and the patient's family after a thorough discussion of the procedural risks, benefits and alternatives. All questions were addressed. Maximal Sterile Barrier Technique was utilized including caps, mask, sterile gowns, sterile gloves, sterile drape, hand hygiene and skin antiseptic. A timeout was performed prior to the initiation of the procedure. The epigastrium was prepped with Betadine in a sterile fashion, and a sterile drape was applied covering the operative field. A sterile gown and sterile gloves were used for the procedure. The night before the procedure, the patient received barium in order to be able to identify the transverse colon under fluoroscopy. PERCUTANEOUS GASTROSTOMY TUBE PLACEMENT: Ultrasound was used  to identify the inferior/left lateral margin of the liver and its relationship to the stomach. A 5-F Kumpe catheter was advanced over a 0.035-inch Bentson guidewire and placed in the stomach under fluoroscopy. The upper abdomen was prepped and draped using all elements of maximal sterile barrier  technique. After inflating the stomach with air, the gastric body was identified by fluoroscopy and appropriate cutaneous entry sites on the anterior abdominal wall were selected. Local anesthesia was provided with 1% lidocaine with epinephrine. Through an 18-G needle, 2 metal T-fasteners were placed through the anterior abdominal wall into the gastric body lumen under fluoroscopy. Once the gastric wall was tacked to the anterior abdominal wall, a stab incision was made using a #11 blade between the two T-fasteners. An 18-G needle was then placed through the incision and into the stomach followed by an 0.035 inch guidewire. The guidewire was coiled in the gastric fundus. The needle was removed and the track was dilated with a 18-F telescoping dilator/peel-away sheath. Over the 0.035 inch guidewire, through the peel-away sheath, a 18-F MIC gastrostomy tube was inserted into the gastric lumen. Contrast injection confirmed proper positioning of the gastrostomy tube within the gastric lumen. The intraluminal balloon was insufflated with dilute contrast and the tube was withdrawn until the balloon was flush against the stomach wall. The outer disc was then lowered to be flush with the skin, securing the tube in place. IMPRESSION: Successful placement of a percutaneous 69 French MIC gastrostomy tube under fluoroscopy. Michaelle Birks, MD [facility] Electronically Signed   By: Michaelle Birks MD   On: 07/29/2020 07:07   DG Abd Portable 1V  Result Date: 07/28/2020 CLINICAL DATA:  45 year old male status post PO barium for gastrostomy tube placement. EXAM: PORTABLE ABDOMEN - 1 VIEW COMPARISON:  CT Abdomen and Pelvis 07/21/2020 and earlier. FINDINGS: Portable AP supine view at 0600 hours. Enteric tube remains in place and loops in the gastric body. Barium contrast is present from the terminal ileum to the splenic flexure, with good opacification of the transverse colon.  Non obstructed bowel gas pattern. No acute osseous abnormality identified. IMPRESSION: Good barium opacification of the transverse colon for gastrostomy placement. Electronically Signed   By: Genevie Ann M.D.   On: 07/28/2020 06:17    Assessment/Plan Active Problems:   Acute on chronic respiratory failure with hypoxia (HCC)   End stage renal disease on dialysis (HCC)   Lobar pneumonia, unspecified organism (HCC)   Chronic diastolic CHF (congestive heart failure) (HCC)   Acute on chronic respiratory failure hypoxia we will continue with the weaning patient we will proceed to capping now End-stage renal disease on hemodialysis we will continue to monitor closely. Lobar pneumonia treated slow improvement Chronic diastolic heart failure supportive care   I have personally seen and evaluated the patient, evaluated laboratory and imaging results, formulated the assessment and plan and placed orders. The Patient requires high complexity decision making with multiple systems involvement.  Rounds were done with the Respiratory Therapy Director and Staff therapists and discussed with nursing staff also.  Allyne Gee, MD [facility] Pulmonary Critical Care Medicine Sleep Medicine

## 2020-07-30 DIAGNOSIS — I5032 Chronic diastolic (congestive) heart failure: Secondary | ICD-10-CM | POA: Diagnosis not present

## 2020-07-30 DIAGNOSIS — J181 Lobar pneumonia, unspecified organism: Secondary | ICD-10-CM | POA: Diagnosis not present

## 2020-07-30 DIAGNOSIS — J9621 Acute and chronic respiratory failure with hypoxia: Secondary | ICD-10-CM | POA: Diagnosis not present

## 2020-07-30 DIAGNOSIS — N186 End stage renal disease: Secondary | ICD-10-CM | POA: Diagnosis not present

## 2020-07-30 LAB — RENAL FUNCTION PANEL
Albumin: 2.1 g/dL — ABNORMAL LOW (ref 3.5–5.0)
Anion gap: 15 (ref 5–15)
BUN: 107 mg/dL — ABNORMAL HIGH (ref 6–20)
CO2: 25 mmol/L (ref 22–32)
Calcium: 9 mg/dL (ref 8.9–10.3)
Chloride: 89 mmol/L — ABNORMAL LOW (ref 98–111)
Creatinine, Ser: 5.47 mg/dL — ABNORMAL HIGH (ref 0.61–1.24)
GFR, Estimated: 12 mL/min — ABNORMAL LOW (ref >60)
Glucose, Bld: 88 mg/dL (ref 70–99)
Phosphorus: 3.2 mg/dL (ref 2.5–4.6)
Potassium: 5.4 mmol/L — ABNORMAL HIGH (ref 3.5–5.1)
Sodium: 129 mmol/L — ABNORMAL LOW (ref 135–145)

## 2020-07-30 LAB — CBC
HCT: 24.3 % — ABNORMAL LOW (ref 39.0–52.0)
Hemoglobin: 7.6 g/dL — ABNORMAL LOW (ref 13.0–17.0)
MCH: 23.7 pg — ABNORMAL LOW (ref 26.0–34.0)
MCHC: 31.3 g/dL (ref 30.0–36.0)
MCV: 75.7 fL — ABNORMAL LOW (ref 80.0–100.0)
Platelets: 460 10*3/uL — ABNORMAL HIGH (ref 150–400)
RBC: 3.21 MIL/uL — ABNORMAL LOW (ref 4.22–5.81)
RDW: 19.2 % — ABNORMAL HIGH (ref 11.5–15.5)
WBC: 15.8 10*3/uL — ABNORMAL HIGH (ref 4.0–10.5)
nRBC: 0 % (ref 0.0–0.2)

## 2020-07-30 NOTE — Progress Notes (Signed)
Pulmonary [place]  PROGRESS NOTE     ELIGHA BLOWER  [id number]  DOB: [birth year]   DOA: 07/17/2020  Referring Physician: [name], MD  HPI: CASTEN GREGORICH is a 45 y.o. male being followed for ventilator/airway/oxygen weaning Acute on Chronic Respiratory Failure.  Low-grade fevers noted patient is otherwise been doing fine and is capping without any distress  Medications: Reviewed on Rounds  Physical Exam:  Vitals: Temperature 100.3 pulse 83 respiratory rate is 12 blood pressure is 185/88 saturations 99%  Ventilator Settings capping of the ventilator  General: Comfortable at this time Neck: supple Cardiovascular: no malignant arrhythmias Respiratory: No rhonchi very coarse breath sounds Skin: no rash seen on limited exam Musculoskeletal: No gross abnormality Psychiatric:unable to assess Neurologic:no involuntary movements         Lab Data:   Basic Metabolic Panel: Recent Labs  Lab 07/25/20 0806 07/28/20 0800  NA 128* 129*  K 4.6 4.4  CL 92* 89*  CO2 27 27  GLUCOSE 156* 163*  BUN 101* 123*  CREATININE 4.98* 5.58*  CALCIUM 8.9 8.9  PHOS 3.7 2.8    ABG: No results for input(s): PHART, PCO2ART, PO2ART, HCO3, O2SAT in the last 168 hours.  Liver Function Tests: Recent Labs  Lab 07/25/20 0806 07/28/20 0800  ALBUMIN 1.8* 1.8*   No results for input(s): LIPASE, AMYLASE in the last 168 hours. No results for input(s): AMMONIA in the last 168 hours.  CBC: Recent Labs  Lab 07/25/20 0806 07/27/20 1125 07/28/20 0800 07/30/20 0808  WBC 15.4*  --  13.4* 15.8*  HGB 7.3* 7.6* 7.3* 7.6*  HCT 23.2* 25.0* 23.1* 24.3*  MCV 75.8*  --  74.0* 75.7*  PLT 400  --  388 460*    Cardiac Enzymes: No results for input(s): CKTOTAL, CKMB, CKMBINDEX, TROPONINI in the last 168 hours.  BNP (last 3 results) No results for input(s): BNP in the last 8760 hours.  ProBNP (last 3  results) No results for input(s): PROBNP in the last 8760 hours.  Radiological Exams: IR GASTROSTOMY TUBE MOD SED  Result Date: 07/29/2020 INDICATION: Dysphagia. EXAM: PERCUTANEOUS GASTROSTOMY TUBE PLACEMENT MEDICATIONS: Ancef 2 gm IV; Antibiotics were administered within 1 hour of the procedure. Glucagon 0.5 Mg IV ANESTHESIA/SEDATION: Versed 1 mg IV; Fentanyl 25 mcg IV Moderate Sedation Time:  23 The patient was continuously monitored during the procedure by the interventional radiology nurse under my direct supervision. CONTRAST:  20 mL OMNIPAQUE IOHEXOL 300 MG/ML SOLN - administered into the gastric lumen. FLUOROSCOPY TIME:  Fluoroscopy Time: 0 minutes 18 seconds (1 mGy). COMPLICATIONS: None immediate. PROCEDURE: Informed written consent was obtained from the patient and the patient's family after a thorough discussion of the procedural risks, benefits and alternatives. All questions were addressed. Maximal Sterile Barrier Technique was utilized including caps, mask, sterile gowns, sterile gloves, sterile drape, hand hygiene and skin antiseptic. A timeout was performed prior to the initiation of the procedure. The epigastrium was prepped with Betadine in a sterile fashion, and a sterile drape was applied covering the operative field. A sterile gown and sterile gloves were used for the procedure. The night before the procedure, the patient received barium in order to be able to identify the transverse colon under fluoroscopy. PERCUTANEOUS GASTROSTOMY TUBE PLACEMENT: Ultrasound was used to identify the inferior/left lateral margin of the liver and its relationship to the stomach. A 5-F Kumpe catheter was advanced over a 0.035-inch Bentson guidewire and placed  in the stomach under fluoroscopy. The upper abdomen was prepped and draped using all elements of maximal sterile barrier technique. After inflating the stomach with air, the gastric body was identified by fluoroscopy and appropriate cutaneous entry sites  on the anterior abdominal wall were selected. Local anesthesia was provided with 1% lidocaine with epinephrine. Through an 18-G needle, 2 metal T-fasteners were placed through the anterior abdominal wall into the gastric body lumen under fluoroscopy. Once the gastric wall was tacked to the anterior abdominal wall, a stab incision was made using a #11 blade between the two T-fasteners. An 18-G needle was then placed through the incision and into the stomach followed by an 0.035 inch guidewire. The guidewire was coiled in the gastric fundus. The needle was removed and the track was dilated with a 18-F telescoping dilator/peel-away sheath. Over the 0.035 inch guidewire, through the peel-away sheath, a 18-F MIC gastrostomy tube was inserted into the gastric lumen. Contrast injection confirmed proper positioning of the gastrostomy tube within the gastric lumen. The intraluminal balloon was insufflated with dilute contrast and the tube was withdrawn until the balloon was flush against the stomach wall. The outer disc was then lowered to be flush with the skin, securing the tube in place. IMPRESSION: Successful placement of a percutaneous 8 French MIC gastrostomy tube under fluoroscopy. Michaelle Birks, MD [facility] Radiology Electronically Signed   By: Michaelle Birks MD   On: 07/29/2020 07:07    Assessment/Plan Active Problems:   Acute on chronic respiratory failure with hypoxia ([place])   End stage renal disease on dialysis ([place])   Lobar pneumonia, unspecified organism ([place])   Chronic diastolic CHF (congestive heart failure) ([place])   Acute on chronic respiratory failure with hypoxia doing well with capping patient will be completing 24 hours continue to advance as tolerated. End-stage renal failure on hemodialysis patient is being seen by nephrology Lobar pneumonia treated slowly improving we will continue to monitor Chronic diastolic heart failure compensated at  this time with supportive care   I have personally seen and evaluated the patient, evaluated laboratory and imaging results, formulated the assessment and plan and placed orders. The Patient requires high complexity decision making with multiple systems involvement.  Rounds were done with the Respiratory Therapy Director and Staff therapists and discussed with nursing staff also.  Allyne Gee, MD [facility] Pulmonary Critical Care Medicine Sleep Medicine

## 2020-07-31 DIAGNOSIS — J9621 Acute and chronic respiratory failure with hypoxia: Secondary | ICD-10-CM | POA: Diagnosis not present

## 2020-07-31 DIAGNOSIS — J181 Lobar pneumonia, unspecified organism: Secondary | ICD-10-CM | POA: Diagnosis not present

## 2020-07-31 DIAGNOSIS — N186 End stage renal disease: Secondary | ICD-10-CM | POA: Diagnosis not present

## 2020-07-31 DIAGNOSIS — I5032 Chronic diastolic (congestive) heart failure: Secondary | ICD-10-CM | POA: Diagnosis not present

## 2020-07-31 NOTE — Progress Notes (Signed)
Pulmonary [place]  PROGRESS NOTE     Jeffery Andrews  [id number]  DOB: 07-17-1975   DOA: 07/17/2020  Referring Physician: [name], MD  HPI: Jeffery Andrews is a 45 y.o. male being followed for ventilator/airway/oxygen weaning Acute on Chronic Respiratory Failure.  At this time patient is capping will be completing 48 hours doing quite well  Medications: Reviewed on Rounds  Physical Exam:  Vitals: Temperature is 98.8 pulse 73 respiratory is 18 blood pressure is 159/86 saturations 100%  Ventilator Settings capping right now at 48-hour goal  General: Comfortable at this time Neck: supple Cardiovascular: no malignant arrhythmias Respiratory: No rhonchi very coarse breath sounds Skin: no rash seen on limited exam Musculoskeletal: No gross abnormality Psychiatric:unable to assess Neurologic:no involuntary movements         Lab Data:   Basic Metabolic Panel: Recent Labs  Lab 07/25/20 0806 07/28/20 0800 07/30/20 0808  NA 128* 129* 129*  K 4.6 4.4 5.4*  CL 92* 89* 89*  CO2 '27 27 25  '$ GLUCOSE 156* 163* 88  BUN 101* 123* 107*  CREATININE 4.98* 5.58* 5.47*  CALCIUM 8.9 8.9 9.0  PHOS 3.7 2.8 3.2    ABG: No results for input(s): PHART, PCO2ART, PO2ART, HCO3, O2SAT in the last 168 hours.  Liver Function Tests: Recent Labs  Lab 07/25/20 0806 07/28/20 0800 07/30/20 0808  ALBUMIN 1.8* 1.8* 2.1*   No results for input(s): LIPASE, AMYLASE in the last 168 hours. No results for input(s): AMMONIA in the last 168 hours.  CBC: Recent Labs  Lab 07/25/20 0806 07/27/20 1125 07/28/20 0800 07/30/20 0808  WBC 15.4*  --  13.4* 15.8*  HGB 7.3* 7.6* 7.3* 7.6*  HCT 23.2* 25.0* 23.1* 24.3*  MCV 75.8*  --  74.0* 75.7*  PLT 400  --  388 460*    Cardiac Enzymes: No results for input(s): CKTOTAL, CKMB, CKMBINDEX, TROPONINI in the last 168 hours.  BNP (last 3 results) No results  for input(s): BNP in the last 8760 hours.  ProBNP (last 3 results) No results for input(s): PROBNP in the last 8760 hours.  Radiological Exams: No results found.  Assessment/Plan Active Problems:   Acute on chronic respiratory failure with hypoxia (HCC)   End stage renal disease on dialysis (HCC)   Lobar pneumonia, unspecified organism (HCC)   Chronic diastolic CHF (congestive heart failure) (HCC)   Acute on chronic respiratory failure with hypoxia plan is going to be to continue with the capping as ordered.  We will continue secretion management supportive care. End-stage renal failure on hemodialysis being seen by nephrology Lobar pneumonia treated with antibiotics slow improvement Chronic diastolic heart failure right now is compensated   I have personally seen and evaluated the patient, evaluated laboratory and imaging results, formulated the assessment and plan and placed orders. The Patient requires high complexity decision making with multiple systems involvement.  Rounds were done with the Respiratory Therapy Director and Staff therapists and discussed with nursing staff also.  Allyne Gee, MD [facility] Pulmonary Critical Care Medicine Sleep Medicine

## 2020-07-31 NOTE — Progress Notes (Signed)
[facility]  ROUNDING NOTE   Subjective:  Patient seen and evaluated at bedside. Tracheostomy currently capped. Not following commands at the moment.  Objective:  Vital signs in last 24 hours:  Temperature 98.8 pulse 93 respirations 18 blood pressure 159/86   Physical Exam: General:  Critically ill-appearing  Head:  Normocephalic, atraumatic.    Eyes:  Anicteric  Neck:  Tracheostomy in place  Lungs:   Clear to auscultation, vent assisted  Heart:  S1S2 no rubs  Abdomen:   Soft, nontender, bowel sounds present  Extremities:  Trace peripheral edema.  Neurologic:  Eyes open, not following commands, bilateral hands with contractures  Skin:  No acute skin rash  Access:  Left upper extremity AV fistula    Basic Metabolic Panel: Recent Labs  Lab 07/25/20 0806 07/28/20 0800 07/30/20 0808  NA 128* 129* 129*  K 4.6 4.4 5.4*  CL 92* 89* 89*  CO2 '27 27 25  '$ GLUCOSE 156* 163* 88  BUN 101* 123* 107*  CREATININE 4.98* 5.58* 5.47*  CALCIUM 8.9 8.9 9.0  PHOS 3.7 2.8 3.2     Liver Function Tests: Recent Labs  Lab 07/25/20 0806 07/28/20 0800 07/30/20 0808  ALBUMIN 1.8* 1.8* 2.1*    No results for input(s): LIPASE, AMYLASE in the last 168 hours. No results for input(s): AMMONIA in the last 168 hours.  CBC: Recent Labs  Lab 07/25/20 0806 07/27/20 1125 07/28/20 0800 07/30/20 0808  WBC 15.4*  --  13.4* 15.8*  HGB 7.3* 7.6* 7.3* 7.6*  HCT 23.2* 25.0* 23.1* 24.3*  MCV 75.8*  --  74.0* 75.7*  PLT 400  --  388 460*     Cardiac Enzymes: No results for input(s): CKTOTAL, CKMB, CKMBINDEX, TROPONINI in the last 168 hours.  BNP: Invalid input(s): POCBNP  CBG: No results for input(s): GLUCAP in the last 168 hours.  Microbiology: Results for orders placed or performed during the hospital encounter of 07/17/20  Culture, blood (routine x 2)     Status: None (Preliminary result)   Collection Time: 07/27/20 11:17 AM   Specimen: BLOOD RIGHT ARM  Result Value  Ref Range Status   Specimen Description BLOOD RIGHT ARM  Final   Special Requests   Final    BOTTLES DRAWN AEROBIC ONLY Blood Culture results may not be optimal due to an inadequate volume of blood received in culture bottles   Culture   Final    NO GROWTH 3 DAYS Performed at [facility], [place] [address], [place], [place] [address]    Report Status PENDING  Incomplete  Culture, blood (routine x 2)     Status: None (Preliminary result)   Collection Time: 07/27/20 11:23 AM   Specimen: BLOOD RIGHT HAND  Result Value Ref Range Status   Specimen Description BLOOD RIGHT HAND  Final   Special Requests   Final    BOTTLES DRAWN AEROBIC ONLY Blood Culture results may not be optimal due to an inadequate volume of blood received in culture bottles   Culture   Final    NO GROWTH 3 DAYS Performed at [facility], [place] [address] [place], [place] [address]    Report Status PENDING  Incomplete    Coagulation Studies: No results for input(s): LABPROT, INR in the last 72 hours.   Urinalysis: No results for input(s): COLORURINE, LABSPEC, PHURINE, GLUCOSEU, HGBUR, BILIRUBINUR, KETONESUR, PROTEINUR, UROBILINOGEN, NITRITE, LEUKOCYTESUR in the last 72 hours.  Invalid input(s): APPERANCEUR    Imaging: No results found.  Medications:       Assessment/ Plan:  45 y.o. male with a PMHx of ESRD on HD, posterior reversible encephalopathy syndrome, diabetes mellitus type 2, chronic systolic heart failure, anemia of chronic kidney disease, secondary hyperparathyroidism, malignant hypertension, acute respiratory failure, who was admitted to [facility] on 06/29/2020 for ongoing treatment of acute respiratory failure, posterior reversible encephalopathy syndrome, and end-stage renal disease.   1.  ESRD on HD.  Maintain patient on TTS dialysis schedule.  Has been tolerating dialysis well.  2.  Anemia of chronic kidney disease.  Hemoglobin up slightly to 7.6.  Continue Retacrit.   Consider blood transfusion for hemoglobin of 7 or less.  3.  Secondary hyperparathyroidism.  Phosphorus remains within target range and currently 3.2.  We will continue to periodically monitor.  4.  Acute respiratory failure.  Tracheostomy placed 07/17/2020.  Tracheostomy currently capped.  5.  Hypertension.  Maintain the patient amlodipine, clonidine, hydralazine, labetalol, and losartan.  Blood pressure this a.m. was 159/86.  6.  Bacteremia.  Gram-positive cocci in clusters noted previously. Line management and antibiotic management per hospitalist.    LOS: 0 Allysa Governale 7/29/20229:26 AM

## 2020-08-01 LAB — CBC
HCT: 22.4 % — ABNORMAL LOW (ref 39.0–52.0)
Hemoglobin: 7 g/dL — ABNORMAL LOW (ref 13.0–17.0)
MCH: 23.6 pg — ABNORMAL LOW (ref 26.0–34.0)
MCHC: 31.3 g/dL (ref 30.0–36.0)
MCV: 75.4 fL — ABNORMAL LOW (ref 80.0–100.0)
Platelets: 399 10*3/uL (ref 150–400)
RBC: 2.97 MIL/uL — ABNORMAL LOW (ref 4.22–5.81)
RDW: 19 % — ABNORMAL HIGH (ref 11.5–15.5)
WBC: 18.6 10*3/uL — ABNORMAL HIGH (ref 4.0–10.5)
nRBC: 0 % (ref 0.0–0.2)

## 2020-08-01 LAB — CULTURE, BLOOD (ROUTINE X 2)
Culture: NO GROWTH
Culture: NO GROWTH

## 2020-08-01 LAB — HEPATITIS B SURFACE ANTIGEN: Hepatitis B Surface Ag: NONREACTIVE

## 2020-08-01 LAB — RENAL FUNCTION PANEL
Albumin: 2 g/dL — ABNORMAL LOW (ref 3.5–5.0)
Anion gap: 14 (ref 5–15)
BUN: 124 mg/dL — ABNORMAL HIGH (ref 6–20)
CO2: 24 mmol/L (ref 22–32)
Calcium: 9 mg/dL (ref 8.9–10.3)
Chloride: 90 mmol/L — ABNORMAL LOW (ref 98–111)
Creatinine, Ser: 5.77 mg/dL — ABNORMAL HIGH (ref 0.61–1.24)
GFR, Estimated: 11 mL/min — ABNORMAL LOW (ref >60)
Glucose, Bld: 110 mg/dL — ABNORMAL HIGH (ref 70–99)
Phosphorus: 3.3 mg/dL (ref 2.5–4.6)
Potassium: 5.3 mmol/L — ABNORMAL HIGH (ref 3.5–5.1)
Sodium: 128 mmol/L — ABNORMAL LOW (ref 135–145)

## 2020-08-01 LAB — POTASSIUM: Potassium: 3.8 mmol/L (ref 3.5–5.1)

## 2020-08-02 ENCOUNTER — Other Ambulatory Visit (HOSPITAL_COMMUNITY): Payer: Medicare Other

## 2020-08-02 DIAGNOSIS — J181 Lobar pneumonia, unspecified organism: Secondary | ICD-10-CM | POA: Diagnosis not present

## 2020-08-02 DIAGNOSIS — J9621 Acute and chronic respiratory failure with hypoxia: Secondary | ICD-10-CM | POA: Diagnosis not present

## 2020-08-02 DIAGNOSIS — I5032 Chronic diastolic (congestive) heart failure: Secondary | ICD-10-CM | POA: Diagnosis not present

## 2020-08-02 DIAGNOSIS — N186 End stage renal disease: Secondary | ICD-10-CM | POA: Diagnosis not present

## 2020-08-02 NOTE — Progress Notes (Signed)
Pulmonary [place]  PROGRESS NOTE     Jeffery Andrews  [id number]  DOB: 1975/06/23   DOA: 07/17/2020  Referring Physician: [name], MD  HPI: Jeffery Andrews is a 45 y.o. male being followed for ventilator/airway/oxygen weaning Acute on Chronic Respiratory Failure.  Patient currently is capping doing very well.  On room air  Medications: Reviewed on Rounds  Physical Exam:  Vitals: Temperature is 99.1 pulse 73 respiratory 21 blood pressure is 124/83 saturations 98%  Ventilator Settings on capping room air  General: Comfortable at this time Neck: supple Cardiovascular: no malignant arrhythmias Respiratory: No rhonchi no rales are noted at this time Skin: no rash seen on limited exam Musculoskeletal: No gross abnormality Psychiatric:unable to assess Neurologic:no involuntary movements         Lab Data:   Basic Metabolic Panel: Recent Labs  Lab 07/28/20 0800 07/30/20 0808 08/01/20 0603 08/01/20 2149  NA 129* 129* 128*  --   K 4.4 5.4* 5.3* 3.8  CL 89* 89* 90*  --   CO2 '27 25 24  '$ --   GLUCOSE 163* 88 110*  --   BUN 123* 107* 124*  --   CREATININE 5.58* 5.47* 5.77*  --   CALCIUM 8.9 9.0 9.0  --   PHOS 2.8 3.2 3.3  --     ABG: No results for input(s): PHART, PCO2ART, PO2ART, HCO3, O2SAT in the last 168 hours.  Liver Function Tests: Recent Labs  Lab 07/28/20 0800 07/30/20 0808 08/01/20 0603  ALBUMIN 1.8* 2.1* 2.0*   No results for input(s): LIPASE, AMYLASE in the last 168 hours. No results for input(s): AMMONIA in the last 168 hours.  CBC: Recent Labs  Lab 07/27/20 1125 07/28/20 0800 07/30/20 0808 08/01/20 0603  WBC  --  13.4* 15.8* 18.6*  HGB 7.6* 7.3* 7.6* 7.0*  HCT 25.0* 23.1* 24.3* 22.4*  MCV  --  74.0* 75.7* 75.4*  PLT  --  388 460* 399    Cardiac Enzymes: No results for input(s): CKTOTAL, CKMB, CKMBINDEX, TROPONINI in the last 168 hours.  BNP  (last 3 results) No results for input(s): BNP in the last 8760 hours.  ProBNP (last 3 results) No results for input(s): PROBNP in the last 8760 hours.  Radiological Exams: DG CHEST PORT 1 VIEW  Result Date: 08/02/2020 CLINICAL DATA:  Pneumonia EXAM: PORTABLE CHEST 1 VIEW COMPARISON:  07/08/2020 FINDINGS: The tracheostomy tube tip is above the carina. Stable cardiomediastinal contours. Small right pleural effusion and mild interstitial edema identified. Mild airspace opacities identified within the left midlung and right lower lung. IMPRESSION: 1. Mild pulmonary edema and small right pleural effusion. 2. Bilateral airspace opacities noted which may represent asymmetric edema or infection. Electronically Signed   By: Kerby Moors M.D.   On: 08/02/2020 06:10    Assessment/Plan Active Problems:   Acute on chronic respiratory failure with hypoxia (HCC)   End stage renal disease on dialysis (HCC)   Lobar pneumonia, unspecified organism (HCC)   Chronic diastolic CHF (congestive heart failure) (HCC)   Acute on chronic respiratory failure hypoxia plan is going to be to continue with capping trials working towards eventual decannulation End-stage renal failure on hemodialysis Lobar pneumonia unchanged we will continue to monitor along closely. Chronic diastolic heart failure supportive care   I have personally seen and evaluated the patient, evaluated laboratory and imaging results, formulated the assessment and plan and placed orders. The Patient requires high  complexity decision making with multiple systems involvement.  Rounds were done with the Respiratory Therapy Director and Staff therapists and discussed with nursing staff also.  Allyne Gee, MD [facility] Pulmonary Critical Care Medicine Sleep Medicine

## 2020-08-03 DIAGNOSIS — J181 Lobar pneumonia, unspecified organism: Secondary | ICD-10-CM | POA: Diagnosis not present

## 2020-08-03 DIAGNOSIS — J9621 Acute and chronic respiratory failure with hypoxia: Secondary | ICD-10-CM | POA: Diagnosis not present

## 2020-08-03 DIAGNOSIS — N186 End stage renal disease: Secondary | ICD-10-CM | POA: Diagnosis not present

## 2020-08-03 DIAGNOSIS — I5032 Chronic diastolic (congestive) heart failure: Secondary | ICD-10-CM | POA: Diagnosis not present

## 2020-08-03 LAB — BASIC METABOLIC PANEL
Anion gap: 12 (ref 5–15)
BUN: 121 mg/dL — ABNORMAL HIGH (ref 6–20)
CO2: 27 mmol/L (ref 22–32)
Calcium: 8.8 mg/dL — ABNORMAL LOW (ref 8.9–10.3)
Chloride: 90 mmol/L — ABNORMAL LOW (ref 98–111)
Creatinine, Ser: 5.14 mg/dL — ABNORMAL HIGH (ref 0.61–1.24)
GFR, Estimated: 13 mL/min — ABNORMAL LOW (ref >60)
Glucose, Bld: 255 mg/dL — ABNORMAL HIGH (ref 70–99)
Potassium: 4.3 mmol/L (ref 3.5–5.1)
Sodium: 129 mmol/L — ABNORMAL LOW (ref 135–145)

## 2020-08-03 LAB — CBC
HCT: 21.4 % — ABNORMAL LOW (ref 39.0–52.0)
Hemoglobin: 6.8 g/dL — CL (ref 13.0–17.0)
MCH: 23.9 pg — ABNORMAL LOW (ref 26.0–34.0)
MCHC: 31.8 g/dL (ref 30.0–36.0)
MCV: 75.4 fL — ABNORMAL LOW (ref 80.0–100.0)
Platelets: 352 10*3/uL (ref 150–400)
RBC: 2.84 MIL/uL — ABNORMAL LOW (ref 4.22–5.81)
RDW: 18.9 % — ABNORMAL HIGH (ref 11.5–15.5)
WBC: 16.5 10*3/uL — ABNORMAL HIGH (ref 4.0–10.5)
nRBC: 0 % (ref 0.0–0.2)

## 2020-08-03 LAB — PREPARE RBC (CROSSMATCH)

## 2020-08-03 NOTE — Progress Notes (Signed)
[facility]  ROUNDING NOTE   Subjective:  Patient seen and evaluated at bedside this AM. Still has neck deviation to the left and has contractures in both upper extremities.  Objective:  Vital signs in last 24 hours:  Temperature 97 pulse 75 respiration 16 blood pressure 155/81   Physical Exam: General: No acute distress  Head:  Normocephalic, atraumatic.    Eyes:  Anicteric  Neck:  Tracheostomy in place  Lungs:   Clear to auscultation, normal effort  Heart:  S1S2 no rubs  Abdomen:   Soft, nontender, bowel sounds present  Extremities:  Trace peripheral edema.  Neurologic:  Neck deviation to the left, contractures in both hands  Skin:  No acute skin rash  Access:  Left upper extremity AV fistula    Basic Metabolic Panel: Recent Labs  Lab 07/28/20 0800 07/30/20 0808 08/01/20 0603 08/01/20 2149 08/03/20 0318  NA 129* 129* 128*  --  129*  K 4.4 5.4* 5.3* 3.8 4.3  CL 89* 89* 90*  --  90*  CO2 '27 25 24  '$ --  27  GLUCOSE 163* 88 110*  --  255*  BUN 123* 107* 124*  --  121*  CREATININE 5.58* 5.47* 5.77*  --  5.14*  CALCIUM 8.9 9.0 9.0  --  8.8*  PHOS 2.8 3.2 3.3  --   --      Liver Function Tests: Recent Labs  Lab 07/28/20 0800 07/30/20 0808 08/01/20 0603  ALBUMIN 1.8* 2.1* 2.0*    No results for input(s): LIPASE, AMYLASE in the last 168 hours. No results for input(s): AMMONIA in the last 168 hours.  CBC: Recent Labs  Lab 07/27/20 1125 07/28/20 0800 07/30/20 0808 08/01/20 0603 08/03/20 0318  WBC  --  13.4* 15.8* 18.6* 16.5*  HGB 7.6* 7.3* 7.6* 7.0* 6.8*  HCT 25.0* 23.1* 24.3* 22.4* 21.4*  MCV  --  74.0* 75.7* 75.4* 75.4*  PLT  --  388 460* 399 352     Cardiac Enzymes: No results for input(s): CKTOTAL, CKMB, CKMBINDEX, TROPONINI in the last 168 hours.  BNP: Invalid input(s): POCBNP  CBG: No results for input(s): GLUCAP in the last 168 hours.  Microbiology: Results for orders placed or performed during the hospital encounter of  07/17/20  Culture, blood (routine x 2)     Status: None   Collection Time: 07/27/20 11:17 AM   Specimen: BLOOD RIGHT ARM  Result Value Ref Range Status   Specimen Description BLOOD RIGHT ARM  Final   Special Requests   Final    BOTTLES DRAWN AEROBIC ONLY Blood Culture results may not be optimal due to an inadequate volume of blood received in culture bottles   Culture   Final    NO GROWTH 5 DAYS Performed at [facility], [place] [address], [place], [place] [address]    Report Status 08/01/2020 FINAL  Final  Culture, blood (routine x 2)     Status: None   Collection Time: 07/27/20 11:23 AM   Specimen: BLOOD RIGHT HAND  Result Value Ref Range Status   Specimen Description BLOOD RIGHT HAND  Final   Special Requests   Final    BOTTLES DRAWN AEROBIC ONLY Blood Culture results may not be optimal due to an inadequate volume of blood received in culture bottles   Culture   Final    NO GROWTH 5 DAYS Performed at [facility], [place] [address] [place], [place] [address]    Report Status 08/01/2020 FINAL  Final  Culture, blood (routine x 2)     Status: None (Preliminary result)   Collection Time: 08/01/20  2:25 PM   Specimen: BLOOD RIGHT HAND  Result Value Ref Range Status   Specimen Description BLOOD RIGHT HAND  Final   Special Requests   Final    BOTTLES DRAWN AEROBIC AND ANAEROBIC Blood Culture results may not be optimal due to an inadequate volume of blood received in culture bottles   Culture   Final    NO GROWTH < 24 HOURS Performed at [facility], [place] [address] [place], [place] [address]    Report Status PENDING  Incomplete  Culture, blood (routine x 2)     Status: None (Preliminary result)   Collection Time: 08/01/20  2:39 PM   Specimen: BLOOD RIGHT ARM  Result Value Ref Range Status   Specimen Description BLOOD RIGHT ARM  Final   Special Requests   Final    BOTTLES DRAWN AEROBIC ONLY Blood Culture results may not be optimal due to an inadequate volume of  blood received in culture bottles   Culture   Final    NO GROWTH < 24 HOURS Performed at [facility], [place] [address], [place], [place] [address]    Report Status PENDING  Incomplete  Culture, Respiratory w Gram Stain     Status: None (Preliminary result)   Collection Time: 08/01/20  2:55 PM   Specimen: Tracheal Aspirate; Respiratory  Result Value Ref Range Status   Specimen Description TRACHEAL ASPIRATE  Final   Special Requests NONE  Final   Gram Stain   Final    FEW WBC PRESENT, PREDOMINANTLY PMN MODERATE GRAM NEGATIVE RODS    Culture   Final    ABUNDANT PSEUDOMONAS AERUGINOSA CULTURE REINCUBATED FOR BETTER GROWTH Performed at [facility], [place] [address] [place], [place] [address]    Report Status PENDING  Incomplete    Coagulation Studies: No results for input(s): LABPROT, INR in the last 72 hours.   Urinalysis: No results for input(s): COLORURINE, LABSPEC, PHURINE, GLUCOSEU, HGBUR, BILIRUBINUR, KETONESUR, PROTEINUR, UROBILINOGEN, NITRITE, LEUKOCYTESUR in the last 72 hours.  Invalid input(s): APPERANCEUR    Imaging: DG CHEST PORT 1 VIEW  Result Date: 08/02/2020 CLINICAL DATA:  Pneumonia EXAM: PORTABLE CHEST 1 VIEW COMPARISON:  07/08/2020 FINDINGS: The tracheostomy tube tip is above the carina. Stable cardiomediastinal contours. Small right pleural effusion and mild interstitial edema identified. Mild airspace opacities identified within the left midlung and right lower lung. IMPRESSION: 1. Mild pulmonary edema and small right pleural effusion. 2. Bilateral airspace opacities noted which may represent asymmetric edema or infection. Electronically Signed   By: Kerby Moors M.D.   On: 08/02/2020 06:10     Medications:       Assessment/ Plan:  45 y.o. male with a PMHx of ESRD on HD, posterior reversible encephalopathy syndrome, diabetes mellitus type 2, chronic systolic heart failure, anemia of chronic kidney disease, secondary hyperparathyroidism,  malignant hypertension, acute respiratory failure, who was admitted to [facility] on 06/29/2020 for ongoing treatment of acute respiratory failure, posterior reversible encephalopathy syndrome, and end-stage renal disease.   1.  ESRD on HD.  Patient due for hemodialysis again tomorrow.  2.  Anemia of chronic kidney disease.  Hemoglobin down to 6.8.  Recommend blood transfusion but defer to primary team.  3.  Secondary hyperparathyroidism.  Most recent serum phosphorus was 3.3 and at target.  4.  Acute respiratory failure.  Tracheostomy placed 07/17/2020.  Tracheostomy currently capped.  5.  Hypertension.  Continue amlodipine, hydralazine, clonidine, labetalol, and losartan.  6.  Bacteremia.  Gram-positive cocci in clusters noted previously. Line management and antibiotic management per hospitalist.    LOS: 0 Jeffery Andrews 8/1/20228:12 AM

## 2020-08-03 NOTE — Progress Notes (Signed)
Pulmonary [place]  PROGRESS NOTE     Jeffery Andrews  [id number]  DOB: 1975-02-24   DOA: 07/17/2020  Referring Physician: [name], MD  HPI: Jeffery Andrews is a 45 y.o. male being followed for ventilator/airway/oxygen weaning Acute on Chronic Respiratory Failure.  Comfortable right now without distress continues to do well with capping working towards consideration of decannulation  Medications: Reviewed on Rounds  Physical Exam:  Vitals: Temperature is 97.5 pulse 75 respiratory 16 blood pressure 155/81 saturations 100%  Ventilator Settings patient currently is on capping day 7  General: Comfortable at this time Neck: supple Cardiovascular: no malignant arrhythmias Respiratory: Scattered rhonchi coarse breath sounds Skin: no rash seen on limited exam Musculoskeletal: No gross abnormality Psychiatric:unable to assess Neurologic:no involuntary movements         Lab Data:   Basic Metabolic Panel: Recent Labs  Lab 07/28/20 0800 07/30/20 0808 08/01/20 0603 08/01/20 2149 08/03/20 0318  NA 129* 129* 128*  --  129*  K 4.4 5.4* 5.3* 3.8 4.3  CL 89* 89* 90*  --  90*  CO2 '27 25 24  '$ --  27  GLUCOSE 163* 88 110*  --  255*  BUN 123* 107* 124*  --  121*  CREATININE 5.58* 5.47* 5.77*  --  5.14*  CALCIUM 8.9 9.0 9.0  --  8.8*  PHOS 2.8 3.2 3.3  --   --     ABG: No results for input(s): PHART, PCO2ART, PO2ART, HCO3, O2SAT in the last 168 hours.  Liver Function Tests: Recent Labs  Lab 07/28/20 0800 07/30/20 0808 08/01/20 0603  ALBUMIN 1.8* 2.1* 2.0*   No results for input(s): LIPASE, AMYLASE in the last 168 hours. No results for input(s): AMMONIA in the last 168 hours.  CBC: Recent Labs  Lab 07/27/20 1125 07/28/20 0800 07/30/20 0808 08/01/20 0603 08/03/20 0318  WBC  --  13.4* 15.8* 18.6* 16.5*  HGB 7.6* 7.3* 7.6* 7.0* 6.8*  HCT 25.0* 23.1* 24.3* 22.4* 21.4*  MCV   --  74.0* 75.7* 75.4* 75.4*  PLT  --  388 460* 399 352    Cardiac Enzymes: No results for input(s): CKTOTAL, CKMB, CKMBINDEX, TROPONINI in the last 168 hours.  BNP (last 3 results) No results for input(s): BNP in the last 8760 hours.  ProBNP (last 3 results) No results for input(s): PROBNP in the last 8760 hours.  Radiological Exams: DG CHEST PORT 1 VIEW  Result Date: 08/02/2020 CLINICAL DATA:  Pneumonia EXAM: PORTABLE CHEST 1 VIEW COMPARISON:  07/08/2020 FINDINGS: The tracheostomy tube tip is above the carina. Stable cardiomediastinal contours. Small right pleural effusion and mild interstitial edema identified. Mild airspace opacities identified within the left midlung and right lower lung. IMPRESSION: 1. Mild pulmonary edema and small right pleural effusion. 2. Bilateral airspace opacities noted which may represent asymmetric edema or infection. Electronically Signed   By: Kerby Moors M.D.   On: 08/02/2020 06:10    Assessment/Plan Active Problems:   Acute on chronic respiratory failure with hypoxia (HCC)   End stage renal disease on dialysis (HCC)   Lobar pneumonia, unspecified organism (HCC)   Chronic diastolic CHF (congestive heart failure) (HCC)   Acute on chronic respiratory failure hypoxia plan is going to be to continue with capping working towards decannulation as discussed. End-stage renal disease patient is on hemodialysis we will continue with supportive care Lobar pneumonia treated improving Chronic diastolic heart failure compensated at this time  I have personally seen and evaluated the patient, evaluated laboratory and imaging results, formulated the assessment and plan and placed orders. The Patient requires high complexity decision making with multiple systems involvement.  Rounds were done with the Respiratory Therapy Director and Staff therapists and discussed with nursing staff also.  Allyne Gee, MD [facility] Pulmonary Critical Care Medicine Sleep  Medicine

## 2020-08-04 DIAGNOSIS — N186 End stage renal disease: Secondary | ICD-10-CM | POA: Diagnosis not present

## 2020-08-04 DIAGNOSIS — J9621 Acute and chronic respiratory failure with hypoxia: Secondary | ICD-10-CM | POA: Diagnosis not present

## 2020-08-04 DIAGNOSIS — J181 Lobar pneumonia, unspecified organism: Secondary | ICD-10-CM | POA: Diagnosis not present

## 2020-08-04 DIAGNOSIS — I5032 Chronic diastolic (congestive) heart failure: Secondary | ICD-10-CM | POA: Diagnosis not present

## 2020-08-04 LAB — CULTURE, RESPIRATORY W GRAM STAIN

## 2020-08-04 LAB — CBC
HCT: 22.6 % — ABNORMAL LOW (ref 39.0–52.0)
Hemoglobin: 7.4 g/dL — ABNORMAL LOW (ref 13.0–17.0)
MCH: 24.6 pg — ABNORMAL LOW (ref 26.0–34.0)
MCHC: 32.7 g/dL (ref 30.0–36.0)
MCV: 75.1 fL — ABNORMAL LOW (ref 80.0–100.0)
Platelets: 366 10*3/uL (ref 150–400)
RBC: 3.01 MIL/uL — ABNORMAL LOW (ref 4.22–5.81)
RDW: 17.7 % — ABNORMAL HIGH (ref 11.5–15.5)
WBC: 17 10*3/uL — ABNORMAL HIGH (ref 4.0–10.5)
nRBC: 0 % (ref 0.0–0.2)

## 2020-08-04 LAB — RENAL FUNCTION PANEL
Albumin: 1.9 g/dL — ABNORMAL LOW (ref 3.5–5.0)
Anion gap: 15 (ref 5–15)
BUN: 153 mg/dL — ABNORMAL HIGH (ref 6–20)
CO2: 26 mmol/L (ref 22–32)
Calcium: 9.3 mg/dL (ref 8.9–10.3)
Chloride: 87 mmol/L — ABNORMAL LOW (ref 98–111)
Creatinine, Ser: 5.84 mg/dL — ABNORMAL HIGH (ref 0.61–1.24)
GFR, Estimated: 11 mL/min — ABNORMAL LOW (ref >60)
Glucose, Bld: 85 mg/dL (ref 70–99)
Phosphorus: 3 mg/dL (ref 2.5–4.6)
Potassium: 4.8 mmol/L (ref 3.5–5.1)
Sodium: 128 mmol/L — ABNORMAL LOW (ref 135–145)

## 2020-08-04 NOTE — Progress Notes (Signed)
Pulmonary [place]  PROGRESS NOTE     ZYDARIUS LINKENHOKER  [id number]  DOB: 1975-12-30   DOA: 07/17/2020  Referring Physician: [name], MD  HPI: Jeffery Andrews is a 45 y.o. male being followed for ventilator/airway/oxygen weaning Acute on Chronic Respiratory Failure.  Patient is capping currently on room air doing well.  Reportedly no secretions  Medications: Reviewed on Rounds  Physical Exam:  Vitals: Temperature is 98.1 pulse 75 respiratory 25 blood pressure 155/84 saturations 100%  Ventilator Settings capping off the ventilator  General: Comfortable at this time Neck: supple Cardiovascular: no malignant arrhythmias Respiratory: No rhonchi no rales are noted at this time Skin: no rash seen on limited exam Musculoskeletal: No gross abnormality Psychiatric:unable to assess Neurologic:no involuntary movements         Lab Data:   Basic Metabolic Panel: Recent Labs  Lab 07/30/20 0808 08/01/20 0603 08/01/20 2149 08/03/20 0318 08/04/20 0438  NA 129* 128*  --  129* 128*  K 5.4* 5.3* 3.8 4.3 4.8  CL 89* 90*  --  90* 87*  CO2 25 24  --  27 26  GLUCOSE 88 110*  --  255* 85  BUN 107* 124*  --  121* 153*  CREATININE 5.47* 5.77*  --  5.14* 5.84*  CALCIUM 9.0 9.0  --  8.8* 9.3  PHOS 3.2 3.3  --   --  3.0    ABG: No results for input(s): PHART, PCO2ART, PO2ART, HCO3, O2SAT in the last 168 hours.  Liver Function Tests: Recent Labs  Lab 07/30/20 0808 08/01/20 0603 08/04/20 0438  ALBUMIN 2.1* 2.0* 1.9*   No results for input(s): LIPASE, AMYLASE in the last 168 hours. No results for input(s): AMMONIA in the last 168 hours.  CBC: Recent Labs  Lab 07/30/20 0808 08/01/20 0603 08/03/20 0318 08/04/20 0438  WBC 15.8* 18.6* 16.5* 17.0*  HGB 7.6* 7.0* 6.8* 7.4*  HCT 24.3* 22.4* 21.4* 22.6*  MCV 75.7* 75.4* 75.4* 75.1*  PLT 460* 399 352 366    Cardiac Enzymes: No results  for input(s): CKTOTAL, CKMB, CKMBINDEX, TROPONINI in the last 168 hours.  BNP (last 3 results) No results for input(s): BNP in the last 8760 hours.  ProBNP (last 3 results) No results for input(s): PROBNP in the last 8760 hours.  Radiological Exams: No results found.  Assessment/Plan Active Problems:   Acute on chronic respiratory failure with hypoxia (HCC)   End stage renal disease on dialysis (HCC)   Lobar pneumonia, unspecified organism (HCC)   Chronic diastolic CHF (congestive heart failure) (HCC)   Acute on chronic respiratory failure hypoxia we will continue with capping working towards eventual decannulation need to discuss with case management regarding goals of care and discharge planning End-stage renal failure on hemodialysis we will continue to monitor along closely. Lobar pneumonia treated improved CHF compensated   I have personally seen and evaluated the patient, evaluated laboratory and imaging results, formulated the assessment and plan and placed orders. The Patient requires high complexity decision making with multiple systems involvement.  Rounds were done with the Respiratory Therapy Director and Staff therapists and discussed with nursing staff also.  Allyne Gee, MD [facility] Pulmonary Critical Care Medicine Sleep Medicine

## 2020-08-05 DIAGNOSIS — J9621 Acute and chronic respiratory failure with hypoxia: Secondary | ICD-10-CM | POA: Diagnosis not present

## 2020-08-05 DIAGNOSIS — J181 Lobar pneumonia, unspecified organism: Secondary | ICD-10-CM | POA: Diagnosis not present

## 2020-08-05 DIAGNOSIS — N186 End stage renal disease: Secondary | ICD-10-CM | POA: Diagnosis not present

## 2020-08-05 DIAGNOSIS — I5032 Chronic diastolic (congestive) heart failure: Secondary | ICD-10-CM | POA: Diagnosis not present

## 2020-08-05 NOTE — Progress Notes (Signed)
[facility]  ROUNDING NOTE   Subjective:  Patient sitting up in chair this AM. Tracheostomy capped. Continues to have neck deviation towards the left. Due for dialysis again tomorrow.  Objective:  Vital signs in last 24 hours:  Temperature 100.6 pulse 74 respirations 18 blood pressure 144/78   Physical Exam: General:  No acute distress  Head:  Normocephalic, atraumatic.    Eyes:  Anicteric  Neck:  Tracheostomy in place  Lungs:   Clear to auscultation, normal effort  Heart:  S1S2 no rubs  Abdomen:   Soft, nontender, bowel sounds present  Extremities:  Trace peripheral edema.  Neurologic:  Neck deviation to the left, contractures in both hands  Skin:  No acute skin rash  Access:  Left upper extremity AV fistula    Basic Metabolic Panel: Recent Labs  Lab 07/30/20 0808 08/01/20 0603 08/01/20 2149 08/03/20 0318 08/04/20 0438  NA 129* 128*  --  129* 128*  K 5.4* 5.3* 3.8 4.3 4.8  CL 89* 90*  --  90* 87*  CO2 25 24  --  27 26  GLUCOSE 88 110*  --  255* 85  BUN 107* 124*  --  121* 153*  CREATININE 5.47* 5.77*  --  5.14* 5.84*  CALCIUM 9.0 9.0  --  8.8* 9.3  PHOS 3.2 3.3  --   --  3.0     Liver Function Tests: Recent Labs  Lab 07/30/20 0808 08/01/20 0603 08/04/20 0438  ALBUMIN 2.1* 2.0* 1.9*    No results for input(s): LIPASE, AMYLASE in the last 168 hours. No results for input(s): AMMONIA in the last 168 hours.  CBC: Recent Labs  Lab 07/30/20 0808 08/01/20 0603 08/03/20 0318 08/04/20 0438  WBC 15.8* 18.6* 16.5* 17.0*  HGB 7.6* 7.0* 6.8* 7.4*  HCT 24.3* 22.4* 21.4* 22.6*  MCV 75.7* 75.4* 75.4* 75.1*  PLT 460* 399 352 366     Cardiac Enzymes: No results for input(s): CKTOTAL, CKMB, CKMBINDEX, TROPONINI in the last 168 hours.  BNP: Invalid input(s): POCBNP  CBG: No results for input(s): GLUCAP in the last 168 hours.  Microbiology: Results for orders placed or performed during the hospital encounter of 07/17/20  Culture, blood  (routine x 2)     Status: None   Collection Time: 07/27/20 11:17 AM   Specimen: BLOOD RIGHT ARM  Result Value Ref Range Status   Specimen Description BLOOD RIGHT ARM  Final   Special Requests   Final    BOTTLES DRAWN AEROBIC ONLY Blood Culture results may not be optimal due to an inadequate volume of blood received in culture bottles   Culture   Final    NO GROWTH 5 DAYS Performed at [facility], [place] [address] [place], [place] [address]    Report Status 08/01/2020 FINAL  Final  Culture, blood (routine x 2)     Status: None   Collection Time: 07/27/20 11:23 AM   Specimen: BLOOD RIGHT HAND  Result Value Ref Range Status   Specimen Description BLOOD RIGHT HAND  Final   Special Requests   Final    BOTTLES DRAWN AEROBIC ONLY Blood Culture results may not be optimal due to an inadequate volume of blood received in culture bottles   Culture   Final    NO GROWTH 5 DAYS Performed at [facility], [place] [address], [place], [place] [address]    Report Status 08/01/2020 FINAL  Final  Culture, blood (routine x 2)  Status: None (Preliminary result)   Collection Time: 08/01/20  2:25 PM   Specimen: BLOOD RIGHT HAND  Result Value Ref Range Status   Specimen Description BLOOD RIGHT HAND  Final   Special Requests   Final    BOTTLES DRAWN AEROBIC AND ANAEROBIC Blood Culture results may not be optimal due to an inadequate volume of blood received in culture bottles   Culture   Final    NO GROWTH 4 DAYS Performed at [facility], [place] [address] [place], [place] [address]    Report Status PENDING  Incomplete  Culture, blood (routine x 2)     Status: None (Preliminary result)   Collection Time: 08/01/20  2:39 PM   Specimen: BLOOD RIGHT ARM  Result Value Ref Range Status   Specimen Description BLOOD RIGHT ARM  Final   Special Requests   Final    BOTTLES DRAWN AEROBIC ONLY Blood Culture results may not be optimal due to an inadequate volume of blood received in culture bottles    Culture   Final    NO GROWTH 4 DAYS Performed at [facility], [place] [address], [place], [place] [address]    Report Status PENDING  Incomplete  Culture, Respiratory w Gram Stain     Status: None   Collection Time: 08/01/20  2:55 PM   Specimen: Tracheal Aspirate; Respiratory  Result Value Ref Range Status   Specimen Description TRACHEAL ASPIRATE  Final   Special Requests NONE  Final   Gram Stain   Final    FEW WBC PRESENT, PREDOMINANTLY PMN MODERATE GRAM NEGATIVE RODS Performed at [facility], [place] [address] [place], [place] [address]    Culture ABUNDANT PSEUDOMONAS AERUGINOSA  Final   Report Status 08/04/2020 FINAL  Final   Organism ID, Bacteria PSEUDOMONAS AERUGINOSA  Final      Susceptibility   Pseudomonas aeruginosa - MIC*    CEFTAZIDIME <=1 SENSITIVE Sensitive     CIPROFLOXACIN <=0.25 SENSITIVE Sensitive     GENTAMICIN 8 INTERMEDIATE Intermediate     IMIPENEM >=16 RESISTANT Resistant     PIP/TAZO <=4 SENSITIVE Sensitive     CEFEPIME 2 SENSITIVE Sensitive     * ABUNDANT PSEUDOMONAS AERUGINOSA    Coagulation Studies: No results for input(s): LABPROT, INR in the last 72 hours.   Urinalysis: No results for input(s): COLORURINE, LABSPEC, PHURINE, GLUCOSEU, HGBUR, BILIRUBINUR, KETONESUR, PROTEINUR, UROBILINOGEN, NITRITE, LEUKOCYTESUR in the last 72 hours.  Invalid input(s): APPERANCEUR    Imaging: No results found.   Medications:       Assessment/ Plan:  46 y.o. male with a PMHx of ESRD on HD, posterior reversible encephalopathy syndrome, diabetes mellitus type 2, chronic systolic heart failure, anemia of chronic kidney disease, secondary hyperparathyroidism, malignant hypertension, acute respiratory failure, who was admitted to [facility] on 06/29/2020 for ongoing treatment of acute respiratory failure, posterior reversible encephalopathy syndrome, and end-stage renal disease.   1.  ESRD on HD.  Maintain patient on TTS dialysis schedule.  Next  treatment tomorrow.  2.  Anemia of chronic kidney disease.  Hemoglobin up to 7.4 posttransfusion.  3.  Secondary hyperparathyroidism.  Serum phosphorus acceptable at 3.0.  Continue periodically monitor.  4.  Acute respiratory failure.  Tracheostomy placed 07/17/2020.  Tracheostomy remains capped at the moment.  5.  Hypertension.  Continue amlodipine, hydralazine, clonidine, labetalol, and losartan.  Blood pressure this a.m. better at 144/78.    LOS: 0 Lailani Tool 8/3/202210:18 AM

## 2020-08-05 NOTE — Progress Notes (Signed)
Jeffery Andrews is a 45 y.o. male s/p image guided gastrostomy tube placement with Dr. Maryelizabeth Kaufmann on 7/27.   G tube was checked on 7/28, intact. Patient had T-Tacks, plan on removing after 8/2.  Patient evaluated at the bedside today, sitting in a recliner not in acute distress.  G tube intact, on tube feeding, site clean and dry. No T-Tacks found. Bumper was not cinched to skin, bumper cinched to skin with no difficulty.   Please call IR for questions and concerns regarding the G tube.   Armando Gang Jeanni Allshouse PA-C 08/05/2020 11:07 AM

## 2020-08-05 NOTE — Progress Notes (Signed)
Pulmonary Jeffery Andrews  PROGRESS NOTE     Jeffery Andrews  [id number]  DOB: 12/31/1975   DOA: 07/17/2020  Referring Physician: [name], MD  HPI: Jeffery Andrews is a 45 y.o. male being followed for ventilator/airway/oxygen weaning Acute on Chronic Respiratory Failure.  Patient is comfortable right now without distress has low-grade fever 100.6  Medications: Reviewed on Rounds  Physical Exam:  Vitals: Temperature 100.6 pulse 74 respiratory rate 18 blood pressure is 144/78 saturations 100%  Ventilator Settings capping on room air  General: Comfortable at this time Neck: supple Cardiovascular: no malignant arrhythmias Respiratory: No rhonchi very coarse breath sounds Skin: no rash seen on limited exam Musculoskeletal: No gross abnormality Psychiatric:unable to assess Neurologic:no involuntary movements         Lab Data:   Basic Metabolic Panel: Recent Labs  Lab 07/30/20 0808 08/01/20 0603 08/01/20 2149 08/03/20 0318 08/04/20 0438  NA 129* 128*  --  129* 128*  K 5.4* 5.3* 3.8 4.3 4.8  CL 89* 90*  --  90* 87*  CO2 25 24  --  27 26  GLUCOSE 88 110*  --  255* 85  BUN 107* 124*  --  121* 153*  CREATININE 5.47* 5.77*  --  5.14* 5.84*  CALCIUM 9.0 9.0  --  8.8* 9.3  PHOS 3.2 3.3  --   --  3.0    ABG: No results for input(s): PHART, PCO2ART, PO2ART, HCO3, O2SAT in the last 168 hours.  Liver Function Tests: Recent Labs  Lab 07/30/20 0808 08/01/20 0603 08/04/20 0438  ALBUMIN 2.1* 2.0* 1.9*   No results for input(s): LIPASE, AMYLASE in the last 168 hours. No results for input(s): AMMONIA in the last 168 hours.  CBC: Recent Labs  Lab 07/30/20 0808 08/01/20 0603 08/03/20 0318 08/04/20 0438  WBC 15.8* 18.6* 16.5* 17.0*  HGB 7.6* 7.0* 6.8* 7.4*  HCT 24.3* 22.4* 21.4* 22.6*  MCV 75.7* 75.4* 75.4* 75.1*  PLT 460* 399 352 366    Cardiac Enzymes: No results for  input(s): CKTOTAL, CKMB, CKMBINDEX, TROPONINI in the last 168 hours.  BNP (last 3 results) No results for input(s): BNP in the last 8760 hours.  ProBNP (last 3 results) No results for input(s): PROBNP in the last 8760 hours.  Radiological Exams: No results found.  Assessment/Plan Active Problems:   Acute on chronic respiratory failure with hypoxia (HCC)   End stage renal disease on dialysis (HCC)   Lobar pneumonia, unspecified organism (HCC)   Chronic diastolic CHF (congestive heart failure) (HCC)   Acute on chronic respiratory failure with hypoxia patient is actually doing well with capping trials.  Does have a low-grade fever as already noted.  Patient has been tolerating dialysis well also.  Waiting to see how he does as far as being in a chair and also waiting on a chair for his dialysis as an outpatient prior to discharge End-stage renal disease on hemodialysis continue to monitor nephrology is following Lobar pneumonia treated supportive care Chronic diastolic heart failure no change we will continue to follow along   I have personally seen and evaluated the patient, evaluated laboratory and imaging results, formulated the assessment and plan and placed orders. The Patient requires high complexity decision making with multiple systems involvement.  Rounds were done with the Respiratory Therapy Director and Staff therapists and discussed with nursing staff also.  Allyne Gee, MD [facility] Pulmonary Critical Care Medicine Sleep Medicine

## 2020-08-06 DIAGNOSIS — J181 Lobar pneumonia, unspecified organism: Secondary | ICD-10-CM | POA: Diagnosis not present

## 2020-08-06 DIAGNOSIS — J9621 Acute and chronic respiratory failure with hypoxia: Secondary | ICD-10-CM | POA: Diagnosis not present

## 2020-08-06 DIAGNOSIS — I5032 Chronic diastolic (congestive) heart failure: Secondary | ICD-10-CM | POA: Diagnosis not present

## 2020-08-06 DIAGNOSIS — N186 End stage renal disease: Secondary | ICD-10-CM | POA: Diagnosis not present

## 2020-08-06 LAB — CULTURE, BLOOD (ROUTINE X 2)
Culture: NO GROWTH
Culture: NO GROWTH

## 2020-08-06 LAB — RENAL FUNCTION PANEL
Albumin: 1.8 g/dL — ABNORMAL LOW (ref 3.5–5.0)
Anion gap: 13 (ref 5–15)
BUN: 121 mg/dL — ABNORMAL HIGH (ref 6–20)
CO2: 27 mmol/L (ref 22–32)
Calcium: 8.8 mg/dL — ABNORMAL LOW (ref 8.9–10.3)
Chloride: 91 mmol/L — ABNORMAL LOW (ref 98–111)
Creatinine, Ser: 5.09 mg/dL — ABNORMAL HIGH (ref 0.61–1.24)
GFR, Estimated: 13 mL/min — ABNORMAL LOW (ref >60)
Glucose, Bld: 120 mg/dL — ABNORMAL HIGH (ref 70–99)
Phosphorus: 2.7 mg/dL (ref 2.5–4.6)
Potassium: 4.4 mmol/L (ref 3.5–5.1)
Sodium: 131 mmol/L — ABNORMAL LOW (ref 135–145)

## 2020-08-06 LAB — CBC
HCT: 19.5 % — ABNORMAL LOW (ref 39.0–52.0)
Hemoglobin: 6.3 g/dL — CL (ref 13.0–17.0)
MCH: 24.5 pg — ABNORMAL LOW (ref 26.0–34.0)
MCHC: 32.3 g/dL (ref 30.0–36.0)
MCV: 75.9 fL — ABNORMAL LOW (ref 80.0–100.0)
Platelets: 304 10*3/uL (ref 150–400)
RBC: 2.57 MIL/uL — ABNORMAL LOW (ref 4.22–5.81)
RDW: 17.9 % — ABNORMAL HIGH (ref 11.5–15.5)
WBC: 25.4 10*3/uL — ABNORMAL HIGH (ref 4.0–10.5)
nRBC: 0 % (ref 0.0–0.2)

## 2020-08-06 LAB — PREPARE RBC (CROSSMATCH)

## 2020-08-06 LAB — VANCOMYCIN, TROUGH: Vancomycin Tr: 9 ug/mL — ABNORMAL LOW (ref 15–20)

## 2020-08-06 NOTE — Progress Notes (Signed)
Pulmonary [place]  PROGRESS NOTE     Jeffery Andrews  [id number]  DOB: 12-01-1975   DOA: 07/17/2020  Referring Physician: [name], MD  HPI: Jeffery Andrews is a 45 y.o. male being followed for ventilator/airway/oxygen weaning Acute on Chronic Respiratory Failure.  Doing well with capping supposed to have dialysis today has been on room air  Medications: Reviewed on Rounds  Physical Exam:  Vitals: Temperature is 98.0 pulse 81 respiratory rate is 17 blood pressure is 171/92 saturations 100%  Ventilator Settings capping on room air  General: Comfortable at this time Neck: supple Cardiovascular: no malignant arrhythmias Respiratory: No rhonchi very coarse breath sounds Skin: no rash seen on limited exam Musculoskeletal: No gross abnormality Psychiatric:unable to assess Neurologic:no involuntary movements         Lab Data:   Basic Metabolic Panel: Recent Labs  Lab 08/01/20 0603 08/01/20 2149 08/03/20 0318 08/04/20 0438  NA 128*  --  129* 128*  K 5.3* 3.8 4.3 4.8  CL 90*  --  90* 87*  CO2 24  --  27 26  GLUCOSE 110*  --  255* 85  BUN 124*  --  121* 153*  CREATININE 5.77*  --  5.14* 5.84*  CALCIUM 9.0  --  8.8* 9.3  PHOS 3.3  --   --  3.0    ABG: No results for input(s): PHART, PCO2ART, PO2ART, HCO3, O2SAT in the last 168 hours.  Liver Function Tests: Recent Labs  Lab 08/01/20 0603 08/04/20 0438  ALBUMIN 2.0* 1.9*   No results for input(s): LIPASE, AMYLASE in the last 168 hours. No results for input(s): AMMONIA in the last 168 hours.  CBC: Recent Labs  Lab 08/01/20 0603 08/03/20 0318 08/04/20 0438  WBC 18.6* 16.5* 17.0*  HGB 7.0* 6.8* 7.4*  HCT 22.4* 21.4* 22.6*  MCV 75.4* 75.4* 75.1*  PLT 399 352 366    Cardiac Enzymes: No results for input(s): CKTOTAL, CKMB, CKMBINDEX, TROPONINI in the last 168 hours.  BNP (last 3 results) No results for  input(s): BNP in the last 8760 hours.  ProBNP (last 3 results) No results for input(s): PROBNP in the last 8760 hours.  Radiological Exams: No results found.  Assessment/Plan Active Problems:   Acute on chronic respiratory failure with hypoxia (HCC)   End stage renal disease on dialysis (HCC)   Lobar pneumonia, unspecified organism (HCC)   Chronic diastolic CHF (congestive heart failure) (HCC)   Acute on chronic respiratory failure hypoxia plan is going to be to continue with capping trials once he passes the chair test for dialysis and we will look at decannulation secretions are reportedly none End-stage renal failure on hemodialysis we will continue to monitor closely Lobar pneumonia supportive care improving Chronic diastolic heart failure compensated   I have personally seen and evaluated the patient, evaluated laboratory and imaging results, formulated the assessment and plan and placed orders. The Patient requires high complexity decision making with multiple systems involvement.  Rounds were done with the Respiratory Therapy Director and Staff therapists and discussed with nursing staff also.  Allyne Gee, MD [facility] Pulmonary Critical Care Medicine Sleep Medicine

## 2020-08-07 DIAGNOSIS — J9621 Acute and chronic respiratory failure with hypoxia: Secondary | ICD-10-CM | POA: Diagnosis not present

## 2020-08-07 DIAGNOSIS — N186 End stage renal disease: Secondary | ICD-10-CM | POA: Diagnosis not present

## 2020-08-07 DIAGNOSIS — J181 Lobar pneumonia, unspecified organism: Secondary | ICD-10-CM | POA: Diagnosis not present

## 2020-08-07 DIAGNOSIS — I5032 Chronic diastolic (congestive) heart failure: Secondary | ICD-10-CM | POA: Diagnosis not present

## 2020-08-07 LAB — TYPE AND SCREEN
ABO/RH(D): O POS
Antibody Screen: NEGATIVE
Unit division: 0
Unit division: 0

## 2020-08-07 LAB — BPAM RBC
Blood Product Expiration Date: 202208312359
Blood Product Expiration Date: 202209032359
ISSUE DATE / TIME: 202208010915
ISSUE DATE / TIME: 202208041038
Unit Type and Rh: 5100
Unit Type and Rh: 5100

## 2020-08-07 LAB — HEMOGLOBIN AND HEMATOCRIT, BLOOD
HCT: 25.6 % — ABNORMAL LOW (ref 39.0–52.0)
Hemoglobin: 7.9 g/dL — ABNORMAL LOW (ref 13.0–17.0)

## 2020-08-07 NOTE — Progress Notes (Signed)
Pulmonary [place]  PROGRESS NOTE     Jeffery Andrews  [id number]  DOB: [birth year]   DOA: 07/17/2020  Referring Physician: [name], MD  HPI: Jeffery Andrews is a 45 y.o. male being followed for ventilator/airway/oxygen weaning Acute on Chronic Respiratory Failure.  Patient is comfortable without distress is afebrile  Medications: Reviewed on Rounds  Physical Exam:  Vitals: Temperature 97.0 pulse 69 respiratory rate is 18 blood pressure is 145/86 saturations 100%  Ventilator Settings capping at this time off the ventilator.  General: Comfortable at this time Neck: supple Cardiovascular: no malignant arrhythmias Respiratory: Scattered rhonchi expansion is equal Skin: no rash seen on limited exam Musculoskeletal: No gross abnormality Psychiatric:unable to assess Neurologic:no involuntary movements         Lab Data:   Basic Metabolic Panel: Recent Labs  Lab 08/01/20 0603 08/01/20 2149 08/03/20 0318 08/04/20 0438 08/06/20 0744  NA 128*  --  129* 128* 131*  K 5.3* 3.8 4.3 4.8 4.4  CL 90*  --  90* 87* 91*  CO2 24  --  '27 26 27  '$ GLUCOSE 110*  --  255* 85 120*  BUN 124*  --  121* 153* 121*  CREATININE 5.77*  --  5.14* 5.84* 5.09*  CALCIUM 9.0  --  8.8* 9.3 8.8*  PHOS 3.3  --   --  3.0 2.7    ABG: No results for input(s): PHART, PCO2ART, PO2ART, HCO3, O2SAT in the last 168 hours.  Liver Function Tests: Recent Labs  Lab 08/01/20 0603 08/04/20 0438 08/06/20 0744  ALBUMIN 2.0* 1.9* 1.8*   No results for input(s): LIPASE, AMYLASE in the last 168 hours. No results for input(s): AMMONIA in the last 168 hours.  CBC: Recent Labs  Lab 08/01/20 0603 08/03/20 0318 08/04/20 0438 08/06/20 0744  WBC 18.6* 16.5* 17.0* 25.4*  HGB 7.0* 6.8* 7.4* 6.3*  HCT 22.4* 21.4* 22.6* 19.5*  MCV 75.4* 75.4* 75.1* 75.9*  PLT 399 352 366 304    Cardiac Enzymes: No results for  input(s): CKTOTAL, CKMB, CKMBINDEX, TROPONINI in the last 168 hours.  BNP (last 3 results) No results for input(s): BNP in the last 8760 hours.  ProBNP (last 3 results) No results for input(s): PROBNP in the last 8760 hours.  Radiological Exams: No results found.  Assessment/Plan Active Problems:   Acute on chronic respiratory failure with hypoxia (HCC)   End stage renal disease on dialysis (HCC)   Lobar pneumonia, unspecified organism (HCC)   Chronic diastolic CHF (congestive heart failure) (HCC)   Acute on chronic respiratory failure hypoxia plan is to continue with capping trials we will still try to figure out a firm discharge plan on an because of his mental status he is likely going to need full support and dialysis care therefore might need to be in a facility rather than an outpatient setting End-stage renal failure on hemodialysis Lobar pneumonia supportive care has been treated Chronic diastolic heart failure compensated   I have personally seen and evaluated the patient, evaluated laboratory and imaging results, formulated the assessment and plan and placed orders. The Patient requires high complexity decision making with multiple systems involvement.  Rounds were done with the Respiratory Therapy Director and Staff therapists and discussed with nursing staff also.  Allyne Gee, MD [facility] Pulmonary Critical Care Medicine Sleep Medicine

## 2020-08-07 NOTE — Progress Notes (Signed)
[facility]  ROUNDING NOTE   Subjective:  Patient resting comfortably. Eyes are open but patient still not following any commands.  Objective:  Vital signs in last 24 hours:  Temperature 97 pulse 69 respirations 18 blood pressure 143/81   Physical Exam: General:  No acute distress  Head:  Normocephalic, atraumatic.    Eyes:  Anicteric  Neck:  Tracheostomy in place  Lungs:   Clear to auscultation, normal effort  Heart:  S1S2 no rubs  Abdomen:   Soft, nontender, bowel sounds present  Extremities:  Trace peripheral edema.  Neurologic:  Neck deviation to the left, contractures in both hands  Skin:  No acute skin rash  Access:  Left upper extremity AV fistula    Basic Metabolic Panel: Recent Labs  Lab 08/01/20 0603 08/01/20 2149 08/03/20 0318 08/04/20 0438 08/06/20 0744  NA 128*  --  129* 128* 131*  K 5.3* 3.8 4.3 4.8 4.4  CL 90*  --  90* 87* 91*  CO2 24  --  '27 26 27  '$ GLUCOSE 110*  --  255* 85 120*  BUN 124*  --  121* 153* 121*  CREATININE 5.77*  --  5.14* 5.84* 5.09*  CALCIUM 9.0  --  8.8* 9.3 8.8*  PHOS 3.3  --   --  3.0 2.7     Liver Function Tests: Recent Labs  Lab 08/01/20 0603 08/04/20 0438 08/06/20 0744  ALBUMIN 2.0* 1.9* 1.8*    No results for input(s): LIPASE, AMYLASE in the last 168 hours. No results for input(s): AMMONIA in the last 168 hours.  CBC: Recent Labs  Lab 08/01/20 0603 08/03/20 0318 08/04/20 0438 08/06/20 0744  WBC 18.6* 16.5* 17.0* 25.4*  HGB 7.0* 6.8* 7.4* 6.3*  HCT 22.4* 21.4* 22.6* 19.5*  MCV 75.4* 75.4* 75.1* 75.9*  PLT 399 352 366 304     Cardiac Enzymes: No results for input(s): CKTOTAL, CKMB, CKMBINDEX, TROPONINI in the last 168 hours.  BNP: Invalid input(s): POCBNP  CBG: No results for input(s): GLUCAP in the last 168 hours.  Microbiology: Results for orders placed or performed during the hospital encounter of 07/17/20  Culture, blood (routine x 2)     Status: None   Collection Time: 07/27/20  11:17 AM   Specimen: BLOOD RIGHT ARM  Result Value Ref Range Status   Specimen Description BLOOD RIGHT ARM  Final   Special Requests   Final    BOTTLES DRAWN AEROBIC ONLY Blood Culture results may not be optimal due to an inadequate volume of blood received in culture bottles   Culture   Final    NO GROWTH 5 DAYS Performed at [facility], [place] [address] [place], [place] [address]    Report Status 08/01/2020 FINAL  Final  Culture, blood (routine x 2)     Status: None   Collection Time: 07/27/20 11:23 AM   Specimen: BLOOD RIGHT HAND  Result Value Ref Range Status   Specimen Description BLOOD RIGHT HAND  Final   Special Requests   Final    BOTTLES DRAWN AEROBIC ONLY Blood Culture results may not be optimal due to an inadequate volume of blood received in culture bottles   Culture   Final    NO GROWTH 5 DAYS Performed at [facility], [place] [address] [place], [place] [address]    Report Status 08/01/2020 FINAL  Final  Culture, blood (routine x 2)     Status: None   Collection Time: 08/01/20  2:25  PM   Specimen: BLOOD RIGHT HAND  Result Value Ref Range Status   Specimen Description BLOOD RIGHT HAND  Final   Special Requests   Final    BOTTLES DRAWN AEROBIC AND ANAEROBIC Blood Culture results may not be optimal due to an inadequate volume of blood received in culture bottles   Culture   Final    NO GROWTH 5 DAYS Performed at [facility], [place] [address], [place], [place] [address]    Report Status 08/06/2020 FINAL  Final  Culture, blood (routine x 2)     Status: None   Collection Time: 08/01/20  2:39 PM   Specimen: BLOOD RIGHT ARM  Result Value Ref Range Status   Specimen Description BLOOD RIGHT ARM  Final   Special Requests   Final    BOTTLES DRAWN AEROBIC ONLY Blood Culture results may not be optimal due to an inadequate volume of blood received in culture bottles   Culture   Final    NO GROWTH 5 DAYS Performed at [facility], [place] [address],  [place], [place] [address]    Report Status 08/06/2020 FINAL  Final  Culture, Respiratory w Gram Stain     Status: None   Collection Time: 08/01/20  2:55 PM   Specimen: Tracheal Aspirate; Respiratory  Result Value Ref Range Status   Specimen Description TRACHEAL ASPIRATE  Final   Special Requests NONE  Final   Gram Stain   Final    FEW WBC PRESENT, PREDOMINANTLY PMN MODERATE GRAM NEGATIVE RODS Performed at [facility], [place] [address], [place], [place] [address]    Culture ABUNDANT PSEUDOMONAS AERUGINOSA  Final   Report Status 08/04/2020 FINAL  Final   Organism ID, Bacteria PSEUDOMONAS AERUGINOSA  Final      Susceptibility   Pseudomonas aeruginosa - MIC*    CEFTAZIDIME <=1 SENSITIVE Sensitive     CIPROFLOXACIN <=0.25 SENSITIVE Sensitive     GENTAMICIN 8 INTERMEDIATE Intermediate     IMIPENEM >=16 RESISTANT Resistant     PIP/TAZO <=4 SENSITIVE Sensitive     CEFEPIME 2 SENSITIVE Sensitive     * ABUNDANT PSEUDOMONAS AERUGINOSA    Coagulation Studies: No results for input(s): LABPROT, INR in the last 72 hours.   Urinalysis: No results for input(s): COLORURINE, LABSPEC, PHURINE, GLUCOSEU, HGBUR, BILIRUBINUR, KETONESUR, PROTEINUR, UROBILINOGEN, NITRITE, LEUKOCYTESUR in the last 72 hours.  Invalid input(s): APPERANCEUR    Imaging: No results found.   Medications:       Assessment/ Plan:  45 y.o. male with a PMHx of ESRD on HD, posterior reversible encephalopathy syndrome, diabetes mellitus type 2, chronic systolic heart failure, anemia of chronic kidney disease, secondary hyperparathyroidism, malignant hypertension, acute respiratory failure, who was admitted to [facility] on 06/29/2020 for ongoing treatment of acute respiratory failure, posterior reversible encephalopathy syndrome, and end-stage renal disease.   1.  ESRD on HD.  Plan for dialysis again tomorrow.  Orders have been prepared.  2.  Anemia of chronic kidney disease.  Hemoglobin down to 6.3.  Consider  blood transfusion but defer to primary team.  3.  Secondary hyperparathyroidism.  Phosphorus currently 2.7.  Continue to monitor.  4.  Acute respiratory failure.  Tracheostomy placed 07/17/2020.  Tracheostomy currently capped.  5.  Hypertension.  Maintain amlodipine, hydralazine, clonidine, labetalol, losartan.  Blood pressure this a.m. was 143/81.    LOS: 0 Arlenne Kimbley 8/5/202211:16 AM

## 2020-08-08 LAB — CBC
HCT: 22 % — ABNORMAL LOW (ref 39.0–52.0)
Hemoglobin: 6.9 g/dL — CL (ref 13.0–17.0)
MCH: 24.4 pg — ABNORMAL LOW (ref 26.0–34.0)
MCHC: 31.4 g/dL (ref 30.0–36.0)
MCV: 77.7 fL — ABNORMAL LOW (ref 80.0–100.0)
Platelets: 289 10*3/uL (ref 150–400)
RBC: 2.83 MIL/uL — ABNORMAL LOW (ref 4.22–5.81)
RDW: 17.9 % — ABNORMAL HIGH (ref 11.5–15.5)
WBC: 12.8 10*3/uL — ABNORMAL HIGH (ref 4.0–10.5)
nRBC: 0 % (ref 0.0–0.2)

## 2020-08-08 LAB — RENAL FUNCTION PANEL
Albumin: 1.8 g/dL — ABNORMAL LOW (ref 3.5–5.0)
Anion gap: 14 (ref 5–15)
BUN: 114 mg/dL — ABNORMAL HIGH (ref 6–20)
CO2: 26 mmol/L (ref 22–32)
Calcium: 9.1 mg/dL (ref 8.9–10.3)
Chloride: 91 mmol/L — ABNORMAL LOW (ref 98–111)
Creatinine, Ser: 4.78 mg/dL — ABNORMAL HIGH (ref 0.61–1.24)
GFR, Estimated: 14 mL/min — ABNORMAL LOW (ref >60)
Glucose, Bld: 245 mg/dL — ABNORMAL HIGH (ref 70–99)
Phosphorus: 2.4 mg/dL — ABNORMAL LOW (ref 2.5–4.6)
Potassium: 3.6 mmol/L (ref 3.5–5.1)
Sodium: 131 mmol/L — ABNORMAL LOW (ref 135–145)

## 2020-08-08 LAB — PREPARE RBC (CROSSMATCH)

## 2020-08-08 LAB — VANCOMYCIN, TROUGH: Vancomycin Tr: 17 ug/mL (ref 15–20)

## 2020-08-09 DIAGNOSIS — J181 Lobar pneumonia, unspecified organism: Secondary | ICD-10-CM | POA: Diagnosis not present

## 2020-08-09 DIAGNOSIS — I5032 Chronic diastolic (congestive) heart failure: Secondary | ICD-10-CM | POA: Diagnosis not present

## 2020-08-09 DIAGNOSIS — J9621 Acute and chronic respiratory failure with hypoxia: Secondary | ICD-10-CM | POA: Diagnosis not present

## 2020-08-09 DIAGNOSIS — N186 End stage renal disease: Secondary | ICD-10-CM | POA: Diagnosis not present

## 2020-08-09 LAB — HEMOGLOBIN AND HEMATOCRIT, BLOOD
HCT: 23.1 % — ABNORMAL LOW (ref 39.0–52.0)
Hemoglobin: 7.3 g/dL — ABNORMAL LOW (ref 13.0–17.0)

## 2020-08-09 NOTE — Progress Notes (Signed)
Pulmonary [place]  PROGRESS NOTE     ORMAL ANASTAS  [id number]  DOB: 08/24/1975   DOA: 07/17/2020  Referring Physician: [name], MD  HPI: Jeffery Andrews is a 45 y.o. male being followed for ventilator/airway/oxygen weaning Acute on Chronic Respiratory Failure.  Patient is capping on room air appears to be comfortable at baseline  Medications: Reviewed on Rounds  Physical Exam:  Vitals: Temperature is 98.8 pulse 63 respiratory rate is 19 blood pressure is 167/83 saturations 100%  Ventilator Settings currently is capping of the ventilator  General: Comfortable at this time Neck: supple Cardiovascular: no malignant arrhythmias Respiratory: No rhonchi very coarse breath sounds Skin: no rash seen on limited exam Musculoskeletal: No gross abnormality Psychiatric:unable to assess Neurologic:no involuntary movements         Lab Data:   Basic Metabolic Panel: Recent Labs  Lab 08/03/20 0318 08/04/20 0438 08/06/20 0744 08/08/20 0609  NA 129* 128* 131* 131*  K 4.3 4.8 4.4 3.6  CL 90* 87* 91* 91*  CO2 '27 26 27 26  '$ GLUCOSE 255* 85 120* 245*  BUN 121* 153* 121* 114*  CREATININE 5.14* 5.84* 5.09* 4.78*  CALCIUM 8.8* 9.3 8.8* 9.1  PHOS  --  3.0 2.7 2.4*    ABG: No results for input(s): PHART, PCO2ART, PO2ART, HCO3, O2SAT in the last 168 hours.  Liver Function Tests: Recent Labs  Lab 08/04/20 0438 08/06/20 0744 08/08/20 0609  ALBUMIN 1.9* 1.8* 1.8*   No results for input(s): LIPASE, AMYLASE in the last 168 hours. No results for input(s): AMMONIA in the last 168 hours.  CBC: Recent Labs  Lab 08/03/20 0318 08/04/20 0438 08/06/20 0744 08/07/20 1209 08/08/20 0609 08/09/20 1004  WBC 16.5* 17.0* 25.4*  --  12.8*  --   HGB 6.8* 7.4* 6.3* 7.9* 6.9* 7.3*  HCT 21.4* 22.6* 19.5* 25.6* 22.0* 23.1*  MCV 75.4* 75.1* 75.9*  --  77.7*  --   PLT 352 366 304  --  289  --      Cardiac Enzymes: No results for input(s): CKTOTAL, CKMB, CKMBINDEX, TROPONINI in the last 168 hours.  BNP (last 3 results) No results for input(s): BNP in the last 8760 hours.  ProBNP (last 3 results) No results for input(s): PROBNP in the last 8760 hours.  Radiological Exams: No results found.  Assessment/Plan Active Problems:   Acute on chronic respiratory failure with hypoxia (HCC)   End stage renal disease on dialysis (HCC)   Lobar pneumonia, unspecified organism (HCC)   Chronic diastolic CHF (congestive heart failure) (HCC)   Acute on chronic respiratory failure hypoxia we will continue with capping patient is doing well so far plan is going to be to continue with the capping trials as ordered.  We will continue secretion management supportive care. End-stage renal disease on hemodialysis we will continue to follow along Lobar pneumonia no change we will continue with supportive care Chronic diastolic heart failure compensated at this time   I have personally seen and evaluated the patient, evaluated laboratory and imaging results, formulated the assessment and plan and placed orders. The Patient requires high complexity decision making with multiple systems involvement.  Rounds were done with the Respiratory Therapy Director and Staff therapists and discussed with nursing staff also.  Allyne Gee, MD [facility] Pulmonary Critical Care Medicine Sleep Medicine

## 2020-08-10 DIAGNOSIS — J9621 Acute and chronic respiratory failure with hypoxia: Secondary | ICD-10-CM | POA: Diagnosis not present

## 2020-08-10 DIAGNOSIS — J181 Lobar pneumonia, unspecified organism: Secondary | ICD-10-CM | POA: Diagnosis not present

## 2020-08-10 DIAGNOSIS — I5032 Chronic diastolic (congestive) heart failure: Secondary | ICD-10-CM | POA: Diagnosis not present

## 2020-08-10 DIAGNOSIS — N186 End stage renal disease: Secondary | ICD-10-CM | POA: Diagnosis not present

## 2020-08-10 NOTE — Progress Notes (Signed)
[facility]  ROUNDING NOTE   Subjective:  Patient appears to be sleeping this AM. Still has neck deviation to the left and contractures in both upper extremities. Due for dialysis again tomorrow.  Objective:  Vital signs in last 24 hours:  Temperature 98 pulse 70 respirations 19 blood pressure 166/86   Physical Exam: General:  No acute distress  Head:  Normocephalic, atraumatic.    Eyes:  Anicteric  Neck:  Tracheostomy in place  Lungs:   Clear to auscultation, normal effort  Heart:  S1S2 no rubs  Abdomen:   Soft, nontender, bowel sounds present  Extremities:  Trace peripheral edema.  Neurologic:  Neck deviation to the left, contractures in both hands  Skin:  No acute skin rash  Access:  Left upper extremity AV fistula    Basic Metabolic Panel: Recent Labs  Lab 08/04/20 0438 08/06/20 0744 08/08/20 0609  NA 128* 131* 131*  K 4.8 4.4 3.6  CL 87* 91* 91*  CO2 '26 27 26  '$ GLUCOSE 85 120* 245*  BUN 153* 121* 114*  CREATININE 5.84* 5.09* 4.78*  CALCIUM 9.3 8.8* 9.1  PHOS 3.0 2.7 2.4*     Liver Function Tests: Recent Labs  Lab 08/04/20 0438 08/06/20 0744 08/08/20 0609  ALBUMIN 1.9* 1.8* 1.8*    No results for input(s): LIPASE, AMYLASE in the last 168 hours. No results for input(s): AMMONIA in the last 168 hours.  CBC: Recent Labs  Lab 08/04/20 0438 08/06/20 0744 08/07/20 1209 08/08/20 0609 08/09/20 1004  WBC 17.0* 25.4*  --  12.8*  --   HGB 7.4* 6.3* 7.9* 6.9* 7.3*  HCT 22.6* 19.5* 25.6* 22.0* 23.1*  MCV 75.1* 75.9*  --  77.7*  --   PLT 366 304  --  289  --      Cardiac Enzymes: No results for input(s): CKTOTAL, CKMB, CKMBINDEX, TROPONINI in the last 168 hours.  BNP: Invalid input(s): POCBNP  CBG: No results for input(s): GLUCAP in the last 168 hours.  Microbiology: Results for orders placed or performed during the hospital encounter of 07/17/20  Culture, blood (routine x 2)     Status: None   Collection Time: 07/27/20 11:17 AM    Specimen: BLOOD RIGHT ARM  Result Value Ref Range Status   Specimen Description BLOOD RIGHT ARM  Final   Special Requests   Final    BOTTLES DRAWN AEROBIC ONLY Blood Culture results may not be optimal due to an inadequate volume of blood received in culture bottles   Culture   Final    NO GROWTH 5 DAYS Performed at [facility], [place] [address], [place], [place] [address]    Report Status 08/01/2020 FINAL  Final  Culture, blood (routine x 2)     Status: None   Collection Time: 07/27/20 11:23 AM   Specimen: BLOOD RIGHT HAND  Result Value Ref Range Status   Specimen Description BLOOD RIGHT HAND  Final   Special Requests   Final    BOTTLES DRAWN AEROBIC ONLY Blood Culture results may not be optimal due to an inadequate volume of blood received in culture bottles   Culture   Final    NO GROWTH 5 DAYS Performed at [facility], [place] [address], [place], [place] [address]    Report Status 08/01/2020 FINAL  Final  Culture, blood (routine x 2)     Status: None   Collection Time: 08/01/20  2:25 PM   Specimen: BLOOD RIGHT HAND  Result  Value Ref Range Status   Specimen Description BLOOD RIGHT HAND  Final   Special Requests   Final    BOTTLES DRAWN AEROBIC AND ANAEROBIC Blood Culture results may not be optimal due to an inadequate volume of blood received in culture bottles   Culture   Final    NO GROWTH 5 DAYS Performed at [facility], [place] [address], [place], [place] [address]    Report Status 08/06/2020 FINAL  Final  Culture, blood (routine x 2)     Status: None   Collection Time: 08/01/20  2:39 PM   Specimen: BLOOD RIGHT ARM  Result Value Ref Range Status   Specimen Description BLOOD RIGHT ARM  Final   Special Requests   Final    BOTTLES DRAWN AEROBIC ONLY Blood Culture results may not be optimal due to an inadequate volume of blood received in culture bottles   Culture   Final    NO GROWTH 5 DAYS Performed at [facility], [place] [address],  [place], [place] [address]    Report Status 08/06/2020 FINAL  Final  Culture, Respiratory w Gram Stain     Status: None   Collection Time: 08/01/20  2:55 PM   Specimen: Tracheal Aspirate; Respiratory  Result Value Ref Range Status   Specimen Description TRACHEAL ASPIRATE  Final   Special Requests NONE  Final   Gram Stain   Final    FEW WBC PRESENT, PREDOMINANTLY PMN MODERATE GRAM NEGATIVE RODS Performed at [facility], [place] [address], [place], [place] [address]    Culture ABUNDANT PSEUDOMONAS AERUGINOSA  Final   Report Status 08/04/2020 FINAL  Final   Organism ID, Bacteria PSEUDOMONAS AERUGINOSA  Final      Susceptibility   Pseudomonas aeruginosa - MIC*    CEFTAZIDIME <=1 SENSITIVE Sensitive     CIPROFLOXACIN <=0.25 SENSITIVE Sensitive     GENTAMICIN 8 INTERMEDIATE Intermediate     IMIPENEM >=16 RESISTANT Resistant     PIP/TAZO <=4 SENSITIVE Sensitive     CEFEPIME 2 SENSITIVE Sensitive     * ABUNDANT PSEUDOMONAS AERUGINOSA    Coagulation Studies: No results for input(s): LABPROT, INR in the last 72 hours.   Urinalysis: No results for input(s): COLORURINE, LABSPEC, PHURINE, GLUCOSEU, HGBUR, BILIRUBINUR, KETONESUR, PROTEINUR, UROBILINOGEN, NITRITE, LEUKOCYTESUR in the last 72 hours.  Invalid input(s): APPERANCEUR    Imaging: No results found.   Medications:       Assessment/ Plan:  45 y.o. male with a PMHx of ESRD on HD, posterior reversible encephalopathy syndrome, diabetes mellitus type 2, chronic systolic heart failure, anemia of chronic kidney disease, secondary hyperparathyroidism, malignant hypertension, acute respiratory failure, who was admitted to [facility] on 06/29/2020 for ongoing treatment of acute respiratory failure, posterior reversible encephalopathy syndrome, and end-stage renal disease.   1.  ESRD on HD.  Maintain the patient on TTS dialysis schedule.  2.  Anemia of chronic kidney disease.  Hemoglobin up to 7.3 posttransfusion.  Continue to  monitor CBC periodically.  3.  Secondary hyperparathyroidism.  Most recent serum phosphorus was 2.4.  Continue to monitor.  4.  Acute respiratory failure.  Tracheostomy placed 07/17/2020.  Tracheostomy currently capped.  5.  Hypertension.  Continue amlodipine, hydralazine, clonidine, labetalol, and losartan.  Blood pressure this a.m. 166/86.    LOS: 0 Azaryah Heathcock 8/8/20228:10 AM

## 2020-08-10 NOTE — Progress Notes (Signed)
[facility]  PROGRESS NOTE     Jeffery Andrews  [id number]  DOB: December 18, 1975   DOA: 07/17/2020  Referring Physician: [name], MD  HPI: Jeffery Andrews is a 45 y.o. male being followed for ventilator/airway/oxygen weaning Acute on Chronic Respiratory Failure.  Patient is capping appears to be resting comfortably without distress remains nonverbal no improvement in mental state  Medications: Reviewed on Rounds  Physical Exam:  Vitals: Temperature is 98.0 pulse 70 respiratory is 19 blood pressure is 166/80 saturations 100%  Ventilator Settings capping right now on room air  General: Comfortable at this time Neck: supple Cardiovascular: no malignant arrhythmias Respiratory: No rhonchi no rales are noted at this time Skin: no rash seen on limited exam Musculoskeletal: No gross abnormality Psychiatric:unable to assess Neurologic:no involuntary movements         Lab Data:   Basic Metabolic Panel: Recent Labs  Lab 08/04/20 0438 08/06/20 0744 08/08/20 0609  NA 128* 131* 131*  K 4.8 4.4 3.6  CL 87* 91* 91*  CO2 '26 27 26  '$ GLUCOSE 85 120* 245*  BUN 153* 121* 114*  CREATININE 5.84* 5.09* 4.78*  CALCIUM 9.3 8.8* 9.1  PHOS 3.0 2.7 2.4*    ABG: No results for input(s): PHART, PCO2ART, PO2ART, HCO3, O2SAT in the last 168 hours.  Liver Function Tests: Recent Labs  Lab 08/04/20 0438 08/06/20 0744 08/08/20 0609  ALBUMIN 1.9* 1.8* 1.8*   No results for input(s): LIPASE, AMYLASE in the last 168 hours. No results for input(s): AMMONIA in the last 168 hours.  CBC: Recent Labs  Lab 08/04/20 0438 08/06/20 0744 08/07/20 1209 08/08/20 0609 08/09/20 1004  WBC 17.0* 25.4*  --  12.8*  --   HGB 7.4* 6.3* 7.9* 6.9* 7.3*  HCT 22.6* 19.5* 25.6* 22.0* 23.1*  MCV 75.1* 75.9*  --  77.7*  --   PLT 366 304  --  289  --     Cardiac Enzymes: No results for input(s): CKTOTAL,  CKMB, CKMBINDEX, TROPONINI in the last 168 hours.  BNP (last 3 results) No results for input(s): BNP in the last 8760 hours.  ProBNP (last 3 results) No results for input(s): PROBNP in the last 8760 hours.  Radiological Exams: No results found.  Assessment/Plan Active Problems:   Acute on chronic respiratory failure with hypoxia (HCC)   End stage renal disease on dialysis (HCC)   Lobar pneumonia, unspecified organism (HCC)   Chronic diastolic CHF (congestive heart failure) (HCC)   Acute on chronic respiratory failure hypoxia patient will continue with capping right now awaiting discharge planning per case management End-stage renal disease on hemodialysis we will continue to monitor closely patient has been deemed as permanent dialysis Lobar pneumonia treated we will continue with supportive care Chronic diastolic heart failure no change monitor patient's fluid status    I have personally seen and evaluated the patient, evaluated laboratory and imaging results, formulated the assessment and plan and placed orders. The Patient requires high complexity decision making with multiple systems involvement.  Rounds were done with the Respiratory Therapy Director and Staff therapists and discussed with nursing staff also.  Allyne Gee, MD [facility] Pulmonary Critical Care Medicine Sleep Medicine

## 2020-08-11 DIAGNOSIS — J181 Lobar pneumonia, unspecified organism: Secondary | ICD-10-CM | POA: Diagnosis not present

## 2020-08-11 DIAGNOSIS — N186 End stage renal disease: Secondary | ICD-10-CM | POA: Diagnosis not present

## 2020-08-11 DIAGNOSIS — I5032 Chronic diastolic (congestive) heart failure: Secondary | ICD-10-CM | POA: Diagnosis not present

## 2020-08-11 DIAGNOSIS — J9621 Acute and chronic respiratory failure with hypoxia: Secondary | ICD-10-CM | POA: Diagnosis not present

## 2020-08-11 LAB — RENAL FUNCTION PANEL
Albumin: 1.9 g/dL — ABNORMAL LOW (ref 3.5–5.0)
Anion gap: 14 (ref 5–15)
BUN: 134 mg/dL — ABNORMAL HIGH (ref 6–20)
CO2: 26 mmol/L (ref 22–32)
Calcium: 9.5 mg/dL (ref 8.9–10.3)
Chloride: 92 mmol/L — ABNORMAL LOW (ref 98–111)
Creatinine, Ser: 5.48 mg/dL — ABNORMAL HIGH (ref 0.61–1.24)
GFR, Estimated: 12 mL/min — ABNORMAL LOW (ref >60)
Glucose, Bld: 186 mg/dL — ABNORMAL HIGH (ref 70–99)
Phosphorus: 2.4 mg/dL — ABNORMAL LOW (ref 2.5–4.6)
Potassium: 4.4 mmol/L (ref 3.5–5.1)
Sodium: 132 mmol/L — ABNORMAL LOW (ref 135–145)

## 2020-08-11 LAB — CBC
HCT: 21.9 % — ABNORMAL LOW (ref 39.0–52.0)
Hemoglobin: 7 g/dL — ABNORMAL LOW (ref 13.0–17.0)
MCH: 25.6 pg — ABNORMAL LOW (ref 26.0–34.0)
MCHC: 32 g/dL (ref 30.0–36.0)
MCV: 80.2 fL (ref 80.0–100.0)
Platelets: 258 10*3/uL (ref 150–400)
RBC: 2.73 MIL/uL — ABNORMAL LOW (ref 4.22–5.81)
RDW: 19.8 % — ABNORMAL HIGH (ref 11.5–15.5)
WBC: 18.1 10*3/uL — ABNORMAL HIGH (ref 4.0–10.5)
nRBC: 0 % (ref 0.0–0.2)

## 2020-08-11 LAB — HEMOGLOBIN AND HEMATOCRIT, BLOOD
HCT: 29.9 % — ABNORMAL LOW (ref 39.0–52.0)
Hemoglobin: 9.7 g/dL — ABNORMAL LOW (ref 13.0–17.0)

## 2020-08-11 LAB — VANCOMYCIN, TROUGH: Vancomycin Tr: 24 ug/mL (ref 15–20)

## 2020-08-11 LAB — OCCULT BLOOD X 1 CARD TO LAB, STOOL: Fecal Occult Bld: NEGATIVE

## 2020-08-11 LAB — PREPARE RBC (CROSSMATCH)

## 2020-08-11 NOTE — Progress Notes (Signed)
[facility]  PROGRESS NOTE     Jeffery Andrews  [id number]  DOB: 03-Aug-1975   DOA: 07/17/2020  Referring Physician: [name], MD  HPI: Jeffery Andrews is a 45 y.o. male being followed for ventilator/airway/oxygen weaning Acute on Chronic Respiratory Failure.  Patient continues to remain capping on room air has been doing well now for the past 12 days  Medications: Reviewed on Rounds  Physical Exam:  Vitals: Temperature is 97.3 pulse 61 respiratory is 15 blood pressure is 148/80 saturations 99%  Ventilator Settings capping of the ventilator  General: Comfortable at this time Neck: supple Cardiovascular: no malignant arrhythmias Respiratory: No rhonchi very coarse breath sounds Skin: no rash seen on limited exam Musculoskeletal: No gross abnormality Psychiatric:unable to assess Neurologic:no involuntary movements         Lab Data:   Basic Metabolic Panel: Recent Labs  Lab 08/06/20 0744 08/08/20 0609 08/11/20 0631  NA 131* 131* 132*  K 4.4 3.6 4.4  CL 91* 91* 92*  CO2 '27 26 26  '$ GLUCOSE 120* 245* 186*  BUN 121* 114* 134*  CREATININE 5.09* 4.78* 5.48*  CALCIUM 8.8* 9.1 9.5  PHOS 2.7 2.4* 2.4*    ABG: No results for input(s): PHART, PCO2ART, PO2ART, HCO3, O2SAT in the last 168 hours.  Liver Function Tests: Recent Labs  Lab 08/06/20 0744 08/08/20 0609 08/11/20 0631  ALBUMIN 1.8* 1.8* 1.9*   No results for input(s): LIPASE, AMYLASE in the last 168 hours. No results for input(s): AMMONIA in the last 168 hours.  CBC: Recent Labs  Lab 08/06/20 0744 08/07/20 1209 08/08/20 0609 08/09/20 1004 08/11/20 0631  WBC 25.4*  --  12.8*  --  18.1*  HGB 6.3* 7.9* 6.9* 7.3* 7.0*  HCT 19.5* 25.6* 22.0* 23.1* 21.9*  MCV 75.9*  --  77.7*  --  80.2  PLT 304  --  289  --  258    Cardiac Enzymes: No results for input(s): CKTOTAL, CKMB, CKMBINDEX, TROPONINI in the last  168 hours.  BNP (last 3 results) No results for input(s): BNP in the last 8760 hours.  ProBNP (last 3 results) No results for input(s): PROBNP in the last 8760 hours.  Radiological Exams: No results found.  Assessment/Plan Active Problems:   Acute on chronic respiratory failure with hypoxia (HCC)   End stage renal disease on dialysis (HCC)   Lobar pneumonia, unspecified organism (HCC)   Chronic diastolic CHF (congestive heart failure) (HCC)   Acute on chronic respiratory failure hypoxia we will continue with capping awaiting discharge planning to be finalized End-stage renal disease on hemodialysis we will continue with supportive care Lobar pneumonia has been treated with antibiotics Chronic diastolic heart failure compensated we will continue with supportive care   I have personally seen and evaluated the patient, evaluated laboratory and imaging results, formulated the assessment and plan and placed orders. The Patient requires high complexity decision making with multiple systems involvement.  Rounds were done with the Respiratory Therapy Director and Staff therapists and discussed with nursing staff also.  Allyne Gee, MD [facility] Pulmonary Critical Care Medicine Sleep Medicine

## 2020-08-12 DIAGNOSIS — I5032 Chronic diastolic (congestive) heart failure: Secondary | ICD-10-CM | POA: Diagnosis not present

## 2020-08-12 DIAGNOSIS — J181 Lobar pneumonia, unspecified organism: Secondary | ICD-10-CM | POA: Diagnosis not present

## 2020-08-12 DIAGNOSIS — J9621 Acute and chronic respiratory failure with hypoxia: Secondary | ICD-10-CM | POA: Diagnosis not present

## 2020-08-12 DIAGNOSIS — N186 End stage renal disease: Secondary | ICD-10-CM | POA: Diagnosis not present

## 2020-08-12 LAB — BPAM RBC
Blood Product Expiration Date: 202209092359
Blood Product Expiration Date: 202209122359
ISSUE DATE / TIME: 202208061248
ISSUE DATE / TIME: 202208091605
Unit Type and Rh: 5100
Unit Type and Rh: 5100

## 2020-08-12 LAB — TYPE AND SCREEN
ABO/RH(D): O POS
Antibody Screen: NEGATIVE
Unit division: 0
Unit division: 0

## 2020-08-12 NOTE — Progress Notes (Signed)
[facility]  ROUNDING NOTE   Subjective:  Patient seen and evaluated at bedside. Physical therapy working with the patient and he is currently sitting up in bed. Patient underwent blood transfusion yesterday.  Objective:  Vital signs in last 24 hours:  Temperature 96.8 pulse 62 respiration 16 blood pressure 157/87   Physical Exam: General:  No acute distress  Head:  Normocephalic, atraumatic.    Eyes:  Anicteric  Neck:  Tracheostomy in place  Lungs:   Clear to auscultation, normal effort  Heart:  S1S2 no rubs  Abdomen:   Soft, nontender, bowel sounds present  Extremities:  Trace peripheral edema.  Neurologic:  Neck deviation to the left, contractures in both hands  Skin:  No acute skin rash  Access:  Left upper extremity AV fistula    Basic Metabolic Panel: Recent Labs  Lab 08/06/20 0744 08/08/20 0609 08/11/20 0631  NA 131* 131* 132*  K 4.4 3.6 4.4  CL 91* 91* 92*  CO2 '27 26 26  '$ GLUCOSE 120* 245* 186*  BUN 121* 114* 134*  CREATININE 5.09* 4.78* 5.48*  CALCIUM 8.8* 9.1 9.5  PHOS 2.7 2.4* 2.4*     Liver Function Tests: Recent Labs  Lab 08/06/20 0744 08/08/20 0609 08/11/20 0631  ALBUMIN 1.8* 1.8* 1.9*    No results for input(s): LIPASE, AMYLASE in the last 168 hours. No results for input(s): AMMONIA in the last 168 hours.  CBC: Recent Labs  Lab 08/06/20 0744 08/07/20 1209 08/08/20 0609 08/09/20 1004 08/11/20 0631 08/11/20 2049  WBC 25.4*  --  12.8*  --  18.1*  --   HGB 6.3* 7.9* 6.9* 7.3* 7.0* 9.7*  HCT 19.5* 25.6* 22.0* 23.1* 21.9* 29.9*  MCV 75.9*  --  77.7*  --  80.2  --   PLT 304  --  289  --  258  --      Cardiac Enzymes: No results for input(s): CKTOTAL, CKMB, CKMBINDEX, TROPONINI in the last 168 hours.  BNP: Invalid input(s): POCBNP  CBG: No results for input(s): GLUCAP in the last 168 hours.  Microbiology: Results for orders placed or performed during the hospital encounter of 07/17/20  Culture, blood (routine x  2)     Status: None   Collection Time: 07/27/20 11:17 AM   Specimen: BLOOD RIGHT ARM  Result Value Ref Range Status   Specimen Description BLOOD RIGHT ARM  Final   Special Requests   Final    BOTTLES DRAWN AEROBIC ONLY Blood Culture results may not be optimal due to an inadequate volume of blood received in culture bottles   Culture   Final    NO GROWTH 5 DAYS Performed at [facility], [place] [address] [place], [place] [address]    Report Status 08/01/2020 FINAL  Final  Culture, blood (routine x 2)     Status: None   Collection Time: 07/27/20 11:23 AM   Specimen: BLOOD RIGHT HAND  Result Value Ref Range Status   Specimen Description BLOOD RIGHT HAND  Final   Special Requests   Final    BOTTLES DRAWN AEROBIC ONLY Blood Culture results may not be optimal due to an inadequate volume of blood received in culture bottles   Culture   Final    NO GROWTH 5 DAYS Performed at [facility], [place] [address] [place], [place] [address]    Report Status 08/01/2020 FINAL  Final  Culture, blood (routine x 2)     Status: None   Collection  Time: 08/01/20  2:25 PM   Specimen: BLOOD RIGHT HAND  Result Value Ref Range Status   Specimen Description BLOOD RIGHT HAND  Final   Special Requests   Final    BOTTLES DRAWN AEROBIC AND ANAEROBIC Blood Culture results may not be optimal due to an inadequate volume of blood received in culture bottles   Culture   Final    NO GROWTH 5 DAYS Performed at [facility], [place] [address], [place], [place] [address]    Report Status 08/06/2020 FINAL  Final  Culture, blood (routine x 2)     Status: None   Collection Time: 08/01/20  2:39 PM   Specimen: BLOOD RIGHT ARM  Result Value Ref Range Status   Specimen Description BLOOD RIGHT ARM  Final   Special Requests   Final    BOTTLES DRAWN AEROBIC ONLY Blood Culture results may not be optimal due to an inadequate volume of blood received in culture bottles   Culture   Final    NO GROWTH 5  DAYS Performed at [facility], [place] [address] [place], [place] [address]    Report Status 08/06/2020 FINAL  Final  Culture, Respiratory w Gram Stain     Status: None   Collection Time: 08/01/20  2:55 PM   Specimen: Tracheal Aspirate; Respiratory  Result Value Ref Range Status   Specimen Description TRACHEAL ASPIRATE  Final   Special Requests NONE  Final   Gram Stain   Final    FEW WBC PRESENT, PREDOMINANTLY PMN MODERATE GRAM NEGATIVE RODS Performed at [facility], [place] [address], [place], [place] [address]    Culture ABUNDANT PSEUDOMONAS AERUGINOSA  Final   Report Status 08/04/2020 FINAL  Final   Organism ID, Bacteria PSEUDOMONAS AERUGINOSA  Final      Susceptibility   Pseudomonas aeruginosa - MIC*    CEFTAZIDIME <=1 SENSITIVE Sensitive     CIPROFLOXACIN <=0.25 SENSITIVE Sensitive     GENTAMICIN 8 INTERMEDIATE Intermediate     IMIPENEM >=16 RESISTANT Resistant     PIP/TAZO <=4 SENSITIVE Sensitive     CEFEPIME 2 SENSITIVE Sensitive     * ABUNDANT PSEUDOMONAS AERUGINOSA    Coagulation Studies: No results for input(s): LABPROT, INR in the last 72 hours.   Urinalysis: No results for input(s): COLORURINE, LABSPEC, PHURINE, GLUCOSEU, HGBUR, BILIRUBINUR, KETONESUR, PROTEINUR, UROBILINOGEN, NITRITE, LEUKOCYTESUR in the last 72 hours.  Invalid input(s): APPERANCEUR    Imaging: No results found.   Medications:       Assessment/ Plan:  45 y.o. male with a PMHx of ESRD on HD, posterior reversible encephalopathy syndrome, diabetes mellitus type 2, chronic systolic heart failure, anemia of chronic kidney disease, secondary hyperparathyroidism, malignant hypertension, acute respiratory failure, who was admitted to [facility] on 06/29/2020 for ongoing treatment of acute respiratory failure, posterior reversible encephalopathy syndrome, and end-stage renal disease.   1.  ESRD on HD.  Patient underwent dialysis yesterday.  No acute indication for dialysis today.   We will plan for dialysis treatment again tomorrow.  2.  Anemia of chronic kidney disease.  Hemoglobin up to 9.7 posttransfusion.  Continue to monitor CBC.  3.  Secondary hyperparathyroidism.  Phosphorus stable at 2.4.  Continue to monitor.  4.  Acute respiratory failure.  Tracheostomy placed 07/17/2020.  Continues to tolerate capped tracheostomy.  5.  Hypertension.  Continue amlodipine, hydralazine, clonidine, labetalol, and losartan.  Blood pressure better this a.m. at 157/87.    LOS: 0 Yasmeen Manka 8/10/20228:15  AM

## 2020-08-12 NOTE — Progress Notes (Signed)
[facility] [place]  PROGRESS NOTE     ZEKE PIRILLO  [id number]  DOB: 09-23-1975   DOA: 07/17/2020  Referring Physician: [name], MD  HPI: ATWOOD WARDROP is a 45 y.o. male being followed for ventilator/airway/oxygen weaning Acute on Chronic Respiratory Failure.  Patient is resting comfortably right now without distress has been afebrile capping on room air  Medications: Reviewed on Rounds  Physical Exam:  Vitals: Temperature is 96.8 pulse 62 respiratory rate 16 blood pressure is 157/87 saturations 100%  Ventilator Settings patient is capping currently off the ventilator on room air  General: Comfortable at this time Neck: supple Cardiovascular: no malignant arrhythmias Respiratory: No rhonchi very coarse breath sounds Skin: no rash seen on limited exam Musculoskeletal: No gross abnormality Psychiatric:unable to assess Neurologic:no involuntary movements         Lab Data:   Basic Metabolic Panel: Recent Labs  Lab 08/06/20 0744 08/08/20 0609 08/11/20 0631  NA 131* 131* 132*  K 4.4 3.6 4.4  CL 91* 91* 92*  CO2 '27 26 26  '$ GLUCOSE 120* 245* 186*  BUN 121* 114* 134*  CREATININE 5.09* 4.78* 5.48*  CALCIUM 8.8* 9.1 9.5  PHOS 2.7 2.4* 2.4*    ABG: No results for input(s): PHART, PCO2ART, PO2ART, HCO3, O2SAT in the last 168 hours.  Liver Function Tests: Recent Labs  Lab 08/06/20 0744 08/08/20 0609 08/11/20 0631  ALBUMIN 1.8* 1.8* 1.9*   No results for input(s): LIPASE, AMYLASE in the last 168 hours. No results for input(s): AMMONIA in the last 168 hours.  CBC: Recent Labs  Lab 08/06/20 0744 08/07/20 1209 08/08/20 0609 08/09/20 1004 08/11/20 0631 08/11/20 2049  WBC 25.4*  --  12.8*  --  18.1*  --   HGB 6.3* 7.9* 6.9* 7.3* 7.0* 9.7*  HCT 19.5* 25.6* 22.0* 23.1* 21.9* 29.9*  MCV 75.9*  --  77.7*  --  80.2  --   PLT 304  --  289  --  258  --     Cardiac  Enzymes: No results for input(s): CKTOTAL, CKMB, CKMBINDEX, TROPONINI in the last 168 hours.  BNP (last 3 results) No results for input(s): BNP in the last 8760 hours.  ProBNP (last 3 results) No results for input(s): PROBNP in the last 8760 hours.  Radiological Exams: No results found.  Assessment/Plan Active Problems:   Acute on chronic respiratory failure with hypoxia (HCC)   End stage renal disease on dialysis (HCC)   Lobar pneumonia, unspecified organism (HCC)   Chronic diastolic CHF (congestive heart failure) (HCC)   Acute on chronic respiratory failure with hypoxia continue with capping trials has been on room air without any distress.  Completing almost 2 weeks of capping End-stage renal disease on hemodialysis still awaiting discharge plan regarding his dialysis management after discharge Lobar pneumonia treated improved Chronic diastolic heart failure no change   I have personally seen and evaluated the patient, evaluated laboratory and imaging results, formulated the assessment and plan and placed orders. The Patient requires high complexity decision making with multiple systems involvement.  Rounds were done with the Respiratory Therapy Director and Staff therapists and discussed with nursing staff also.  Allyne Gee, MD [facility] Pulmonary Critical Care Medicine Sleep Medicine

## 2020-08-13 DIAGNOSIS — N186 End stage renal disease: Secondary | ICD-10-CM | POA: Diagnosis not present

## 2020-08-13 DIAGNOSIS — I5032 Chronic diastolic (congestive) heart failure: Secondary | ICD-10-CM | POA: Diagnosis not present

## 2020-08-13 DIAGNOSIS — J9621 Acute and chronic respiratory failure with hypoxia: Secondary | ICD-10-CM | POA: Diagnosis not present

## 2020-08-13 DIAGNOSIS — J181 Lobar pneumonia, unspecified organism: Secondary | ICD-10-CM | POA: Diagnosis not present

## 2020-08-13 LAB — RENAL FUNCTION PANEL
Albumin: 1.9 g/dL — ABNORMAL LOW (ref 3.5–5.0)
Anion gap: 12 (ref 5–15)
BUN: 100 mg/dL — ABNORMAL HIGH (ref 6–20)
CO2: 29 mmol/L (ref 22–32)
Calcium: 9.6 mg/dL (ref 8.9–10.3)
Chloride: 94 mmol/L — ABNORMAL LOW (ref 98–111)
Creatinine, Ser: 4.85 mg/dL — ABNORMAL HIGH (ref 0.61–1.24)
GFR, Estimated: 14 mL/min — ABNORMAL LOW (ref >60)
Glucose, Bld: 186 mg/dL — ABNORMAL HIGH (ref 70–99)
Phosphorus: 2.2 mg/dL — ABNORMAL LOW (ref 2.5–4.6)
Potassium: 4.2 mmol/L (ref 3.5–5.1)
Sodium: 135 mmol/L (ref 135–145)

## 2020-08-13 LAB — CBC
HCT: 24.4 % — ABNORMAL LOW (ref 39.0–52.0)
Hemoglobin: 7.6 g/dL — ABNORMAL LOW (ref 13.0–17.0)
MCH: 25.8 pg — ABNORMAL LOW (ref 26.0–34.0)
MCHC: 31.1 g/dL (ref 30.0–36.0)
MCV: 82.7 fL (ref 80.0–100.0)
Platelets: 256 10*3/uL (ref 150–400)
RBC: 2.95 MIL/uL — ABNORMAL LOW (ref 4.22–5.81)
RDW: 20 % — ABNORMAL HIGH (ref 11.5–15.5)
WBC: 14.2 10*3/uL — ABNORMAL HIGH (ref 4.0–10.5)
nRBC: 0 % (ref 0.0–0.2)

## 2020-08-13 NOTE — Progress Notes (Signed)
Pulmonary [place]  PROGRESS NOTE     Jeffery Andrews  [id number]  DOB: 1975-06-18   DOA: 07/17/2020  Referring Physician: [name], MD  HPI: Jeffery Andrews is a 45 y.o. male being followed for ventilator/airway/oxygen weaning Acute on Chronic Respiratory Failure.  Patient is comfortable right now without distress afebrile remains on capping trials  Medications: Reviewed on Rounds  Physical Exam:  Vitals: Temperature is 97.4 pulse 6425.?  Blood pressure is 179/95 saturations 99%  Ventilator Settings capping off the ventilator at this time  General: Comfortable at this time Neck: supple Cardiovascular: no malignant arrhythmias Respiratory: No rhonchi very coarse breath sounds Skin: no rash seen on limited exam Musculoskeletal: No gross abnormality Psychiatric:unable to assess Neurologic:no involuntary movements         Lab Data:   Basic Metabolic Panel: Recent Labs  Lab 08/08/20 0609 08/11/20 0631 08/13/20 0657  NA 131* 132* 135  K 3.6 4.4 4.2  CL 91* 92* 94*  CO2 '26 26 29  '$ GLUCOSE 245* 186* 186*  BUN 114* 134* 100*  CREATININE 4.78* 5.48* 4.85*  CALCIUM 9.1 9.5 9.6  PHOS 2.4* 2.4* 2.2*    ABG: No results for input(s): PHART, PCO2ART, PO2ART, HCO3, O2SAT in the last 168 hours.  Liver Function Tests: Recent Labs  Lab 08/08/20 0609 08/11/20 0631 08/13/20 0657  ALBUMIN 1.8* 1.9* 1.9*   No results for input(s): LIPASE, AMYLASE in the last 168 hours. No results for input(s): AMMONIA in the last 168 hours.  CBC: Recent Labs  Lab 08/08/20 0609 08/09/20 1004 08/11/20 0631 08/11/20 2049 08/13/20 0657  WBC 12.8*  --  18.1*  --  14.2*  HGB 6.9* 7.3* 7.0* 9.7* 7.6*  HCT 22.0* 23.1* 21.9* 29.9* 24.4*  MCV 77.7*  --  80.2  --  82.7  PLT 289  --  258  --  256    Cardiac Enzymes: No results for input(s): CKTOTAL, CKMB, CKMBINDEX, TROPONINI in the last 168  hours.  BNP (last 3 results) No results for input(s): BNP in the last 8760 hours.  ProBNP (last 3 results) No results for input(s): PROBNP in the last 8760 hours.  Radiological Exams: No results found.  Assessment/Plan Active Problems:   Acute on chronic respiratory failure with hypoxia (HCC)   End stage renal disease on dialysis (HCC)   Lobar pneumonia, unspecified organism (HCC)   Chronic diastolic CHF (congestive heart failure) (HCC)   Acute on chronic respiratory failure hypoxia we will continue with capping patient is done well on room air.  Awaiting discharge planning May possibly going locally if dialysis center except 7 End-stage renal failure on hemodialysis we will continue to monitor prognosis guarded Lobar pneumonia treated we will continue to follow along closely CHF compensated right now   I have personally seen and evaluated the patient, evaluated laboratory and imaging results, formulated the assessment and plan and placed orders. The Patient requires high complexity decision making with multiple systems involvement.  Rounds were done with the Respiratory Therapy Director and Staff therapists and discussed with nursing staff also.  Allyne Gee, MD [facility] Pulmonary Critical Care Medicine Sleep Medicine

## 2020-08-14 DIAGNOSIS — N186 End stage renal disease: Secondary | ICD-10-CM | POA: Diagnosis not present

## 2020-08-14 DIAGNOSIS — J181 Lobar pneumonia, unspecified organism: Secondary | ICD-10-CM | POA: Diagnosis not present

## 2020-08-14 DIAGNOSIS — J9621 Acute and chronic respiratory failure with hypoxia: Secondary | ICD-10-CM | POA: Diagnosis not present

## 2020-08-14 DIAGNOSIS — I5032 Chronic diastolic (congestive) heart failure: Secondary | ICD-10-CM | POA: Diagnosis not present

## 2020-08-14 NOTE — Progress Notes (Signed)
Pulmonary [place]  PROGRESS NOTE     Jeffery Andrews  [id number]  DOB: 1975-05-09   DOA: 07/17/2020  Referring Physician: [name], MD  HPI: Jeffery Andrews is a 45 y.o. male being followed for ventilator/airway/oxygen weaning Acute on Chronic Respiratory Failure.  Patient is resting comfortably right now without distress at this time remains capping off the ventilator.  Medications: Reviewed on Rounds  Physical Exam:  Vitals: Temperature is 98.8 pulse 60 respiratory rate is 18 blood pressure is 160/89 saturations 100%  Ventilator Settings capping off the ventilator  General: Comfortable at this time Neck: supple Cardiovascular: no malignant arrhythmias Respiratory: No rhonchi very coarse breath sounds Skin: no rash seen on limited exam Musculoskeletal: No gross abnormality Psychiatric:unable to assess Neurologic:no involuntary movements         Lab Data:   Basic Metabolic Panel: Recent Labs  Lab 08/08/20 0609 08/11/20 0631 08/13/20 0657  NA 131* 132* 135  K 3.6 4.4 4.2  CL 91* 92* 94*  CO2 '26 26 29  '$ GLUCOSE 245* 186* 186*  BUN 114* 134* 100*  CREATININE 4.78* 5.48* 4.85*  CALCIUM 9.1 9.5 9.6  PHOS 2.4* 2.4* 2.2*    ABG: No results for input(s): PHART, PCO2ART, PO2ART, HCO3, O2SAT in the last 168 hours.  Liver Function Tests: Recent Labs  Lab 08/08/20 0609 08/11/20 0631 08/13/20 0657  ALBUMIN 1.8* 1.9* 1.9*   No results for input(s): LIPASE, AMYLASE in the last 168 hours. No results for input(s): AMMONIA in the last 168 hours.  CBC: Recent Labs  Lab 08/08/20 0609 08/09/20 1004 08/11/20 0631 08/11/20 2049 08/13/20 0657  WBC 12.8*  --  18.1*  --  14.2*  HGB 6.9* 7.3* 7.0* 9.7* 7.6*  HCT 22.0* 23.1* 21.9* 29.9* 24.4*  MCV 77.7*  --  80.2  --  82.7  PLT 289  --  258  --  256    Cardiac Enzymes: No results for input(s): CKTOTAL, CKMB, CKMBINDEX,  TROPONINI in the last 168 hours.  BNP (last 3 results) No results for input(s): BNP in the last 8760 hours.  ProBNP (last 3 results) No results for input(s): PROBNP in the last 8760 hours.  Radiological Exams: No results found.  Assessment/Plan Active Problems:   Acute on chronic respiratory failure with hypoxia (HCC)   End stage renal disease on dialysis (HCC)   Lobar pneumonia, unspecified organism (HCC)   Chronic diastolic CHF (congestive heart failure) (HCC)   Acute on chronic respiratory failure with hypoxia we will continue with the capping trials has been on room air doing well.  We will continue to follow along closely. End-stage renal failure patient is on hemodialysis at this time Lobar pneumonia treated we will continue to follow along closely Chronic diastolic heart failure compensated   I have personally seen and evaluated the patient, evaluated laboratory and imaging results, formulated the assessment and plan and placed orders. The Patient requires high complexity decision making with multiple systems involvement.  Rounds were done with the Respiratory Therapy Director and Staff therapists and discussed with nursing staff also.  Allyne Gee, MD [facility] Pulmonary Critical Care Medicine Sleep Medicine

## 2020-08-14 NOTE — Progress Notes (Signed)
[facility]  ROUNDING NOTE   Subjective:  Patient resting comfortably in bed. Eyes are open. Not following commands.  Objective:  Vital signs in last 24 hours:  Temperature 98.8 pulse 60 respirations 18 blood pressure 169/91   Physical Exam: General:  No acute distress  Head:  Normocephalic, atraumatic.    Eyes:  Anicteric  Neck:  Tracheostomy in place  Lungs:   Clear to auscultation, normal effort  Heart:  S1S2 no rubs  Abdomen:   Soft, nontender, bowel sounds present  Extremities:  Trace peripheral edema.  Neurologic:  Neck deviation to the left, contractures in both hands  Skin:  No acute skin rash  Access:  Left upper extremity AV fistula    Basic Metabolic Panel: Recent Labs  Lab 08/08/20 0609 08/11/20 0631 08/13/20 0657  NA 131* 132* 135  K 3.6 4.4 4.2  CL 91* 92* 94*  CO2 '26 26 29  '$ GLUCOSE 245* 186* 186*  BUN 114* 134* 100*  CREATININE 4.78* 5.48* 4.85*  CALCIUM 9.1 9.5 9.6  PHOS 2.4* 2.4* 2.2*     Liver Function Tests: Recent Labs  Lab 08/08/20 0609 08/11/20 0631 08/13/20 0657  ALBUMIN 1.8* 1.9* 1.9*    No results for input(s): LIPASE, AMYLASE in the last 168 hours. No results for input(s): AMMONIA in the last 168 hours.  CBC: Recent Labs  Lab 08/08/20 0609 08/09/20 1004 08/11/20 0631 08/11/20 2049 08/13/20 0657  WBC 12.8*  --  18.1*  --  14.2*  HGB 6.9* 7.3* 7.0* 9.7* 7.6*  HCT 22.0* 23.1* 21.9* 29.9* 24.4*  MCV 77.7*  --  80.2  --  82.7  PLT 289  --  258  --  256     Cardiac Enzymes: No results for input(s): CKTOTAL, CKMB, CKMBINDEX, TROPONINI in the last 168 hours.  BNP: Invalid input(s): POCBNP  CBG: No results for input(s): GLUCAP in the last 168 hours.  Microbiology: Results for orders placed or performed during the hospital encounter of 07/17/20  Culture, blood (routine x 2)     Status: None   Collection Time: 07/27/20 11:17 AM   Specimen: BLOOD RIGHT ARM  Result Value Ref Range Status   Specimen  Description BLOOD RIGHT ARM  Final   Special Requests   Final    BOTTLES DRAWN AEROBIC ONLY Blood Culture results may not be optimal due to an inadequate volume of blood received in culture bottles   Culture   Final    NO GROWTH 5 DAYS Performed at [facility], [place] [address] [place], [place] [address]    Report Status 08/01/2020 FINAL  Final  Culture, blood (routine x 2)     Status: None   Collection Time: 07/27/20 11:23 AM   Specimen: BLOOD RIGHT HAND  Result Value Ref Range Status   Specimen Description BLOOD RIGHT HAND  Final   Special Requests   Final    BOTTLES DRAWN AEROBIC ONLY Blood Culture results may not be optimal due to an inadequate volume of blood received in culture bottles   Culture   Final    NO GROWTH 5 DAYS Performed at [facility], [place] [address], [place], [place] [address]    Report Status 08/01/2020 FINAL  Final  Culture, blood (routine x 2)     Status: None   Collection Time: 08/01/20  2:25 PM   Specimen: BLOOD RIGHT HAND  Result Value Ref Range Status   Specimen Description BLOOD RIGHT HAND  Final  Special Requests   Final    BOTTLES DRAWN AEROBIC AND ANAEROBIC Blood Culture results may not be optimal due to an inadequate volume of blood received in culture bottles   Culture   Final    NO GROWTH 5 DAYS Performed at [facility], [place] [address] [place], [place] [address]    Report Status 08/06/2020 FINAL  Final  Culture, blood (routine x 2)     Status: None   Collection Time: 08/01/20  2:39 PM   Specimen: BLOOD RIGHT ARM  Result Value Ref Range Status   Specimen Description BLOOD RIGHT ARM  Final   Special Requests   Final    BOTTLES DRAWN AEROBIC ONLY Blood Culture results may not be optimal due to an inadequate volume of blood received in culture bottles   Culture   Final    NO GROWTH 5 DAYS Performed at [facility], [place] [address], [place], [place] [address]    Report Status 08/06/2020 FINAL  Final  Culture,  Respiratory w Gram Stain     Status: None   Collection Time: 08/01/20  2:55 PM   Specimen: Tracheal Aspirate; Respiratory  Result Value Ref Range Status   Specimen Description TRACHEAL ASPIRATE  Final   Special Requests NONE  Final   Gram Stain   Final    FEW WBC PRESENT, PREDOMINANTLY PMN MODERATE GRAM NEGATIVE RODS Performed at [facility], [place] [address] [place], [place] [address]    Culture ABUNDANT PSEUDOMONAS AERUGINOSA  Final   Report Status 08/04/2020 FINAL  Final   Organism ID, Bacteria PSEUDOMONAS AERUGINOSA  Final      Susceptibility   Pseudomonas aeruginosa - MIC*    CEFTAZIDIME <=1 SENSITIVE Sensitive     CIPROFLOXACIN <=0.25 SENSITIVE Sensitive     GENTAMICIN 8 INTERMEDIATE Intermediate     IMIPENEM >=16 RESISTANT Resistant     PIP/TAZO <=4 SENSITIVE Sensitive     CEFEPIME 2 SENSITIVE Sensitive     * ABUNDANT PSEUDOMONAS AERUGINOSA    Coagulation Studies: No results for input(s): LABPROT, INR in the last 72 hours.   Urinalysis: No results for input(s): COLORURINE, LABSPEC, PHURINE, GLUCOSEU, HGBUR, BILIRUBINUR, KETONESUR, PROTEINUR, UROBILINOGEN, NITRITE, LEUKOCYTESUR in the last 72 hours.  Invalid input(s): APPERANCEUR    Imaging: No results found.   Medications:       Assessment/ Plan:  45 y.o. male with a PMHx of ESRD on HD, posterior reversible encephalopathy syndrome, diabetes mellitus type 2, chronic systolic heart failure, anemia of chronic kidney disease, secondary hyperparathyroidism, malignant hypertension, acute respiratory failure, who was admitted to [facility] on 06/29/2020 for ongoing treatment of acute respiratory failure, posterior reversible encephalopathy syndrome, and end-stage renal disease.   1.  ESRD on HD.  Continue dialysis on TTS schedule.  Next dialysis treatment tomorrow.  2.  Anemia of chronic kidney disease.  Hemoglobin dropping again posttransfusion.  Hemoglobin currently 7.6.  Continue to monitor closely.   Transfuse for hemoglobin of 7 or less.  3.  Secondary hyperparathyroidism.  Phosphorus currently 2.2 and acceptable.  4.  Acute respiratory failure.  Tracheostomy placed 07/17/2020.  Continues to tolerate capped tracheostomy.  5.  Hypertension.  Continue amlodipine, hydralazine, clonidine, labetalol, and losartan.  Blood pressure continues to be labile and a bit higher today at 169/91.    LOS: 0 Eero Dini 8/12/20229:56 AM

## 2020-08-15 LAB — RENAL FUNCTION PANEL
Albumin: 1.9 g/dL — ABNORMAL LOW (ref 3.5–5.0)
Anion gap: 10 (ref 5–15)
BUN: 101 mg/dL — ABNORMAL HIGH (ref 6–20)
CO2: 29 mmol/L (ref 22–32)
Calcium: 9.6 mg/dL (ref 8.9–10.3)
Chloride: 95 mmol/L — ABNORMAL LOW (ref 98–111)
Creatinine, Ser: 4.47 mg/dL — ABNORMAL HIGH (ref 0.61–1.24)
GFR, Estimated: 16 mL/min — ABNORMAL LOW (ref >60)
Glucose, Bld: 109 mg/dL — ABNORMAL HIGH (ref 70–99)
Phosphorus: 2.2 mg/dL — ABNORMAL LOW (ref 2.5–4.6)
Potassium: 4.4 mmol/L (ref 3.5–5.1)
Sodium: 134 mmol/L — ABNORMAL LOW (ref 135–145)

## 2020-08-15 LAB — CBC
HCT: 25.4 % — ABNORMAL LOW (ref 39.0–52.0)
Hemoglobin: 8 g/dL — ABNORMAL LOW (ref 13.0–17.0)
MCH: 25.9 pg — ABNORMAL LOW (ref 26.0–34.0)
MCHC: 31.5 g/dL (ref 30.0–36.0)
MCV: 82.2 fL (ref 80.0–100.0)
Platelets: 257 10*3/uL (ref 150–400)
RBC: 3.09 MIL/uL — ABNORMAL LOW (ref 4.22–5.81)
RDW: 20.1 % — ABNORMAL HIGH (ref 11.5–15.5)
WBC: 13.4 10*3/uL — ABNORMAL HIGH (ref 4.0–10.5)
nRBC: 0 % (ref 0.0–0.2)

## 2020-08-16 DIAGNOSIS — J9621 Acute and chronic respiratory failure with hypoxia: Secondary | ICD-10-CM | POA: Diagnosis not present

## 2020-08-16 DIAGNOSIS — N186 End stage renal disease: Secondary | ICD-10-CM | POA: Diagnosis not present

## 2020-08-16 DIAGNOSIS — I5032 Chronic diastolic (congestive) heart failure: Secondary | ICD-10-CM | POA: Diagnosis not present

## 2020-08-16 DIAGNOSIS — J181 Lobar pneumonia, unspecified organism: Secondary | ICD-10-CM | POA: Diagnosis not present

## 2020-08-16 NOTE — Progress Notes (Signed)
Pulmonary [place]  PROGRESS NOTE     Jeffery Andrews  [id number]  DOB: 09-21-1975   DOA: 07/17/2020  Referring Physician: [name], MD  HPI: ARLANDO PHARES is a 45 y.o. male being followed for ventilator/airway/oxygen weaning Acute on Chronic Respiratory Failure.  Patient is currently capping on room air his eyes are open nonverbal  Medications: Reviewed on Rounds  Physical Exam:  Vitals: Temperature is 96.9 pulse 60 respiratory rate is 11 blood pressure is 168/88 saturations 100%  Ventilator Settings currently capping on room air  General: Comfortable at this time Neck: supple Cardiovascular: no malignant arrhythmias Respiratory: No rhonchi very coarse breath sounds are noted Skin: no rash seen on limited exam Musculoskeletal: No gross abnormality Psychiatric:unable to assess Neurologic:no involuntary movements         Lab Data:   Basic Metabolic Panel: Recent Labs  Lab 08/11/20 0631 08/13/20 0657 08/15/20 0658  NA 132* 135 134*  K 4.4 4.2 4.4  CL 92* 94* 95*  CO2 '26 29 29  '$ GLUCOSE 186* 186* 109*  BUN 134* 100* 101*  CREATININE 5.48* 4.85* 4.47*  CALCIUM 9.5 9.6 9.6  PHOS 2.4* 2.2* 2.2*    ABG: No results for input(s): PHART, PCO2ART, PO2ART, HCO3, O2SAT in the last 168 hours.  Liver Function Tests: Recent Labs  Lab 08/11/20 0631 08/13/20 0657 08/15/20 0658  ALBUMIN 1.9* 1.9* 1.9*   No results for input(s): LIPASE, AMYLASE in the last 168 hours. No results for input(s): AMMONIA in the last 168 hours.  CBC: Recent Labs  Lab 08/09/20 1004 08/11/20 0631 08/11/20 2049 08/13/20 0657 08/15/20 0658  WBC  --  18.1*  --  14.2* 13.4*  HGB 7.3* 7.0* 9.7* 7.6* 8.0*  HCT 23.1* 21.9* 29.9* 24.4* 25.4*  MCV  --  80.2  --  82.7 82.2  PLT  --  258  --  256 257    Cardiac Enzymes: No results for input(s): CKTOTAL, CKMB, CKMBINDEX, TROPONINI in the last 168  hours.  BNP (last 3 results) No results for input(s): BNP in the last 8760 hours.  ProBNP (last 3 results) No results for input(s): PROBNP in the last 8760 hours.  Radiological Exams: No results found.  Assessment/Plan Active Problems:   Acute on chronic respiratory failure with hypoxia (HCC)   End stage renal disease on dialysis (HCC)   Lobar pneumonia, unspecified organism (HCC)   Chronic diastolic CHF (congestive heart failure) (HCC)   Acute on chronic respiratory failure hypoxia plan is going to be to continue with capping as tolerated.  Patient's overall mental state has not changed End-stage renal disease on hemodialysis we will continue to monitor closely. Lobar pneumonia treated Chronic diastolic heart failure compensated   I have personally seen and evaluated the patient, evaluated laboratory and imaging results, formulated the assessment and plan and placed orders. The Patient requires high complexity decision making with multiple systems involvement.  Rounds were done with the Respiratory Therapy Director and Staff therapists and discussed with nursing staff also.  Allyne Gee, MD [facility] Pulmonary Critical Care Medicine Sleep Medicine

## 2020-08-17 DIAGNOSIS — J181 Lobar pneumonia, unspecified organism: Secondary | ICD-10-CM | POA: Diagnosis not present

## 2020-08-17 DIAGNOSIS — I5032 Chronic diastolic (congestive) heart failure: Secondary | ICD-10-CM | POA: Diagnosis not present

## 2020-08-17 DIAGNOSIS — J9621 Acute and chronic respiratory failure with hypoxia: Secondary | ICD-10-CM | POA: Diagnosis not present

## 2020-08-17 DIAGNOSIS — N186 End stage renal disease: Secondary | ICD-10-CM | POA: Diagnosis not present

## 2020-08-17 LAB — CBC
HCT: 24.6 % — ABNORMAL LOW (ref 39.0–52.0)
Hemoglobin: 7.8 g/dL — ABNORMAL LOW (ref 13.0–17.0)
MCH: 26.4 pg (ref 26.0–34.0)
MCHC: 31.7 g/dL (ref 30.0–36.0)
MCV: 83.1 fL (ref 80.0–100.0)
Platelets: 249 10*3/uL (ref 150–400)
RBC: 2.96 MIL/uL — ABNORMAL LOW (ref 4.22–5.81)
RDW: 20 % — ABNORMAL HIGH (ref 11.5–15.5)
WBC: 12.1 10*3/uL — ABNORMAL HIGH (ref 4.0–10.5)
nRBC: 0 % (ref 0.0–0.2)

## 2020-08-17 LAB — RENAL FUNCTION PANEL
Albumin: 1.9 g/dL — ABNORMAL LOW (ref 3.5–5.0)
Anion gap: 10 (ref 5–15)
BUN: 97 mg/dL — ABNORMAL HIGH (ref 6–20)
CO2: 28 mmol/L (ref 22–32)
Calcium: 9.5 mg/dL (ref 8.9–10.3)
Chloride: 92 mmol/L — ABNORMAL LOW (ref 98–111)
Creatinine, Ser: 4.17 mg/dL — ABNORMAL HIGH (ref 0.61–1.24)
GFR, Estimated: 17 mL/min — ABNORMAL LOW (ref >60)
Glucose, Bld: 202 mg/dL — ABNORMAL HIGH (ref 70–99)
Phosphorus: 2 mg/dL — ABNORMAL LOW (ref 2.5–4.6)
Potassium: 4.5 mmol/L (ref 3.5–5.1)
Sodium: 130 mmol/L — ABNORMAL LOW (ref 135–145)

## 2020-08-17 NOTE — Progress Notes (Signed)
Pulmonary [name]  PROGRESS NOTE     Jeffery Andrews  [id number]  DOB: 11/20/1975   DOA: 07/17/2020  Referring Physician: [name], MD  HPI: Jeffery Andrews is a 45 y.o. male being followed for ventilator/airway/oxygen weaning Acute on Chronic Respiratory Failure.  Patient currently is capping has been doing relatively well.  Secretions are reportedly minimal  Medications: Reviewed on Rounds  Physical Exam:  Vitals: Temperature is 96.9 pulse 63 respiratory rate is 13 blood pressure is 165/92 saturations 100%  Ventilator Settings capping trials right now without distress  General: Comfortable at this time Neck: supple Cardiovascular: no malignant arrhythmias Respiratory: No rhonchi no rales are noted at this time. Skin: no rash seen on limited exam Musculoskeletal: No gross abnormality Psychiatric:unable to assess Neurologic:no involuntary movements         Lab Data:   Basic Metabolic Panel: Recent Labs  Lab 08/11/20 0631 08/13/20 0657 08/15/20 0658  NA 132* 135 134*  K 4.4 4.2 4.4  CL 92* 94* 95*  CO2 '26 29 29  '$ GLUCOSE 186* 186* 109*  BUN 134* 100* 101*  CREATININE 5.48* 4.85* 4.47*  CALCIUM 9.5 9.6 9.6  PHOS 2.4* 2.2* 2.2*    ABG: No results for input(s): PHART, PCO2ART, PO2ART, HCO3, O2SAT in the last 168 hours.  Liver Function Tests: Recent Labs  Lab 08/11/20 0631 08/13/20 0657 08/15/20 0658  ALBUMIN 1.9* 1.9* 1.9*   No results for input(s): LIPASE, AMYLASE in the last 168 hours. No results for input(s): AMMONIA in the last 168 hours.  CBC: Recent Labs  Lab 08/11/20 0631 08/11/20 2049 08/13/20 0657 08/15/20 0658  WBC 18.1*  --  14.2* 13.4*  HGB 7.0* 9.7* 7.6* 8.0*  HCT 21.9* 29.9* 24.4* 25.4*  MCV 80.2  --  82.7 82.2  PLT 258  --  256 257    Cardiac Enzymes: No results for input(s): CKTOTAL, CKMB, CKMBINDEX, TROPONINI in the last 168  hours.  BNP (last 3 results) No results for input(s): BNP in the last 8760 hours.  ProBNP (last 3 results) No results for input(s): PROBNP in the last 8760 hours.  Radiological Exams: No results found.  Assessment/Plan Active Problems:   Acute on chronic respiratory failure with hypoxia (HCC)   End stage renal disease on dialysis (HCC)   Lobar pneumonia, unspecified organism (HCC)   Chronic diastolic CHF (congestive heart failure) (HCC)   Acute on chronic respiratory failure with hypoxia plan is going to be to continue with capping trials titrate oxygen as tolerated continue with pulmonary toilet. End-stage renal failure on hemodialysis continue to monitor closely Lobar pneumonia treated slow improvement back to baseline Chronic diastolic heart failure compensated at this time   I have personally seen and evaluated the patient, evaluated laboratory and imaging results, formulated the assessment and plan and placed orders. The Patient requires high complexity decision making with multiple systems involvement.  Rounds were done with the Respiratory Therapy Director and Staff therapists and discussed with nursing staff also.  Allyne Gee, MD [facility] Pulmonary Critical Care Medicine Sleep Medicine

## 2020-08-17 NOTE — Progress Notes (Signed)
[facility]  ROUNDING NOTE   Subjective:  Patient sitting up in chair this AM. Still has contractures in upper extremities and neck deviation to the left. Not following any commands.  Objective:  Vital signs in last 24 hours:  Temperature 96.9 pulse 63 respirations 13 blood pressure 165/92   Physical Exam: General:  No acute distress  Head:  Normocephalic, atraumatic.    Eyes:  Anicteric  Neck:  Tracheostomy in place  Lungs:   Clear to auscultation, normal effort  Heart:  S1S2 no rubs  Abdomen:   Soft, nontender, bowel sounds present  Extremities:  Trace peripheral edema.  Neurologic:  Neck deviation to the left, contractures in both hands  Skin:  No acute skin rash  Access:  Left upper extremity AV fistula    Basic Metabolic Panel: Recent Labs  Lab 08/11/20 0631 08/13/20 0657 08/15/20 0658  NA 132* 135 134*  K 4.4 4.2 4.4  CL 92* 94* 95*  CO2 '26 29 29  '$ GLUCOSE 186* 186* 109*  BUN 134* 100* 101*  CREATININE 5.48* 4.85* 4.47*  CALCIUM 9.5 9.6 9.6  PHOS 2.4* 2.2* 2.2*     Liver Function Tests: Recent Labs  Lab 08/11/20 0631 08/13/20 0657 08/15/20 0658  ALBUMIN 1.9* 1.9* 1.9*    No results for input(s): LIPASE, AMYLASE in the last 168 hours. No results for input(s): AMMONIA in the last 168 hours.  CBC: Recent Labs  Lab 08/11/20 0631 08/11/20 2049 08/13/20 0657 08/15/20 0658  WBC 18.1*  --  14.2* 13.4*  HGB 7.0* 9.7* 7.6* 8.0*  HCT 21.9* 29.9* 24.4* 25.4*  MCV 80.2  --  82.7 82.2  PLT 258  --  256 257     Cardiac Enzymes: No results for input(s): CKTOTAL, CKMB, CKMBINDEX, TROPONINI in the last 168 hours.  BNP: Invalid input(s): POCBNP  CBG: No results for input(s): GLUCAP in the last 168 hours.  Microbiology: Results for orders placed or performed during the hospital encounter of 07/17/20  Culture, blood (routine x 2)     Status: None   Collection Time: 07/27/20 11:17 AM   Specimen: BLOOD RIGHT ARM  Result Value Ref Range  Status   Specimen Description BLOOD RIGHT ARM  Final   Special Requests   Final    BOTTLES DRAWN AEROBIC ONLY Blood Culture results may not be optimal due to an inadequate volume of blood received in culture bottles   Culture   Final    NO GROWTH 5 DAYS Performed at [facility], [place] [address] [place], [place] [address]    Report Status 08/01/2020 FINAL  Final  Culture, blood (routine x 2)     Status: None   Collection Time: 07/27/20 11:23 AM   Specimen: BLOOD RIGHT HAND  Result Value Ref Range Status   Specimen Description BLOOD RIGHT HAND  Final   Special Requests   Final    BOTTLES DRAWN AEROBIC ONLY Blood Culture results may not be optimal due to an inadequate volume of blood received in culture bottles   Culture   Final    NO GROWTH 5 DAYS Performed at [facility], [place] [address] [place], [place] [address]    Report Status 08/01/2020 FINAL  Final  Culture, blood (routine x 2)     Status: None   Collection Time: 08/01/20  2:25 PM   Specimen: BLOOD RIGHT HAND  Result Value Ref Range Status   Specimen Description BLOOD RIGHT HAND  Final  Special Requests   Final    BOTTLES DRAWN AEROBIC AND ANAEROBIC Blood Culture results may not be optimal due to an inadequate volume of blood received in culture bottles   Culture   Final    NO GROWTH 5 DAYS Performed at [facility], [place] [address], [place], [place] [address]    Report Status 08/06/2020 FINAL  Final  Culture, blood (routine x 2)     Status: None   Collection Time: 08/01/20  2:39 PM   Specimen: BLOOD RIGHT ARM  Result Value Ref Range Status   Specimen Description BLOOD RIGHT ARM  Final   Special Requests   Final    BOTTLES DRAWN AEROBIC ONLY Blood Culture results may not be optimal due to an inadequate volume of blood received in culture bottles   Culture   Final    NO GROWTH 5 DAYS Performed at [facility], [place] [address], [place], [place] [address]    Report Status 08/06/2020 FINAL  Final   Culture, Respiratory w Gram Stain     Status: None   Collection Time: 08/01/20  2:55 PM   Specimen: Tracheal Aspirate; Respiratory  Result Value Ref Range Status   Specimen Description TRACHEAL ASPIRATE  Final   Special Requests NONE  Final   Gram Stain   Final    FEW WBC PRESENT, PREDOMINANTLY PMN MODERATE GRAM NEGATIVE RODS Performed at [facility], [place] [address], [place],  [address]    Culture ABUNDANT PSEUDOMONAS AERUGINOSA  Final   Report Status 08/04/2020 FINAL  Final   Organism ID, Bacteria PSEUDOMONAS AERUGINOSA  Final      Susceptibility   Pseudomonas aeruginosa - MIC*    CEFTAZIDIME <=1 SENSITIVE Sensitive     CIPROFLOXACIN <=0.25 SENSITIVE Sensitive     GENTAMICIN 8 INTERMEDIATE Intermediate     IMIPENEM >=16 RESISTANT Resistant     PIP/TAZO <=4 SENSITIVE Sensitive     CEFEPIME 2 SENSITIVE Sensitive     * ABUNDANT PSEUDOMONAS AERUGINOSA    Coagulation Studies: No results for input(s): LABPROT, INR in the last 72 hours.   Urinalysis: No results for input(s): COLORURINE, LABSPEC, PHURINE, GLUCOSEU, HGBUR, BILIRUBINUR, KETONESUR, PROTEINUR, UROBILINOGEN, NITRITE, LEUKOCYTESUR in the last 72 hours.  Invalid input(s): APPERANCEUR    Imaging: No results found.   Medications:       Assessment/ Plan:  45 y.o. male with a PMHx of ESRD on HD, posterior reversible encephalopathy syndrome, diabetes mellitus type 2, chronic systolic heart failure, anemia of chronic kidney disease, secondary hyperparathyroidism, malignant hypertension, acute respiratory failure, who was admitted to [facility] on 06/29/2020 for ongoing treatment of acute respiratory failure, posterior reversible encephalopathy syndrome, and end-stage renal disease.   1.  ESRD on HD.  We will switch the patient to MWF dialysis schedule.  Next dialysis treatment today.  2.  Anemia of chronic kidney disease.  Hemoglobin currently 8.0.  Maintain the patient on Retacrit.  3.  Secondary  hyperparathyroidism.  Repeat serum phosphorus today.  4.  Acute respiratory failure.  Tracheostomy placed 07/17/2020.  Continues to tolerate capped tracheostomy.  5.  Hypertension.  Continue amlodipine, hydralazine, clonidine, labetalol, and losartan.  Blood pressure this a.m. was 165/92.    LOS: 0 Tayloranne Lekas 8/15/20229:36 AM

## 2020-08-18 DIAGNOSIS — N186 End stage renal disease: Secondary | ICD-10-CM | POA: Diagnosis not present

## 2020-08-18 DIAGNOSIS — J181 Lobar pneumonia, unspecified organism: Secondary | ICD-10-CM | POA: Diagnosis not present

## 2020-08-18 DIAGNOSIS — J9621 Acute and chronic respiratory failure with hypoxia: Secondary | ICD-10-CM | POA: Diagnosis not present

## 2020-08-18 DIAGNOSIS — I5032 Chronic diastolic (congestive) heart failure: Secondary | ICD-10-CM | POA: Diagnosis not present

## 2020-08-18 NOTE — Progress Notes (Signed)
[facility] [place]  PROGRESS NOTE     JUVENCIO HALVORSON  [id number]  DOB: January 10, 1975   DOA: 07/17/2020  Referring Physician: [name], MD  HPI: BUKHARI SERVEDIO is a 45 y.o. male being followed for ventilator/airway/oxygen weaning Acute on Chronic Respiratory Failure.  Patient is resting comfortably right now without distress no fevers are noted.  Has been doing well with capping  Medications: Reviewed on Rounds  Physical Exam:  Vitals: Temperature is 96.6 pulse 67 respiratory is 28 blood pressure is 124/82 saturations 100%  Ventilator Settings capping off the ventilator  General: Comfortable at this time Neck: supple Cardiovascular: no malignant arrhythmias Respiratory: No rhonchi no rales are noted at this time Skin: no rash seen on limited exam Musculoskeletal: No gross abnormality Psychiatric:unable to assess Neurologic:no involuntary movements         Lab Data:   Basic Metabolic Panel: Recent Labs  Lab 08/13/20 0657 08/15/20 0658 08/17/20 1153  NA 135 134* 130*  K 4.2 4.4 4.5  CL 94* 95* 92*  CO2 '29 29 28  '$ GLUCOSE 186* 109* 202*  BUN 100* 101* 97*  CREATININE 4.85* 4.47* 4.17*  CALCIUM 9.6 9.6 9.5  PHOS 2.2* 2.2* 2.0*    ABG: No results for input(s): PHART, PCO2ART, PO2ART, HCO3, O2SAT in the last 168 hours.  Liver Function Tests: Recent Labs  Lab 08/13/20 0657 08/15/20 0658 08/17/20 1153  ALBUMIN 1.9* 1.9* 1.9*   No results for input(s): LIPASE, AMYLASE in the last 168 hours. No results for input(s): AMMONIA in the last 168 hours.  CBC: Recent Labs  Lab 08/11/20 2049 08/13/20 0657 08/15/20 0658 08/17/20 1153  WBC  --  14.2* 13.4* 12.1*  HGB 9.7* 7.6* 8.0* 7.8*  HCT 29.9* 24.4* 25.4* 24.6*  MCV  --  82.7 82.2 83.1  PLT  --  256 257 249    Cardiac Enzymes: No results for input(s): CKTOTAL, CKMB, CKMBINDEX, TROPONINI in the last 168  hours.  BNP (last 3 results) No results for input(s): BNP in the last 8760 hours.  ProBNP (last 3 results) No results for input(s): PROBNP in the last 8760 hours.  Radiological Exams: No results found.  Assessment/Plan Active Problems:   Acute on chronic respiratory failure with hypoxia (HCC)   End stage renal disease on dialysis (HCC)   Lobar pneumonia, unspecified organism (HCC)   Chronic diastolic CHF (congestive heart failure) (HCC)   Acute on chronic respiratory failure hypoxia plan is to continue with capping trials secretions have been minimal to absent End-stage renal disease on hemodialysis continue to monitor Lobar pneumonia treated we will continue with supportive care Chronic diastolic heart failure compensated   I have personally seen and evaluated the patient, evaluated laboratory and imaging results, formulated the assessment and plan and placed orders. The Patient requires high complexity decision making with multiple systems involvement.  Rounds were done with the Respiratory Therapy Director and Staff therapists and discussed with nursing staff also.  Allyne Gee, MD [facility] Pulmonary Critical Care Medicine Sleep Medicine

## 2020-08-19 DIAGNOSIS — J9621 Acute and chronic respiratory failure with hypoxia: Secondary | ICD-10-CM | POA: Diagnosis not present

## 2020-08-19 DIAGNOSIS — J181 Lobar pneumonia, unspecified organism: Secondary | ICD-10-CM | POA: Diagnosis not present

## 2020-08-19 DIAGNOSIS — N186 End stage renal disease: Secondary | ICD-10-CM | POA: Diagnosis not present

## 2020-08-19 DIAGNOSIS — I5032 Chronic diastolic (congestive) heart failure: Secondary | ICD-10-CM | POA: Diagnosis not present

## 2020-08-19 LAB — RENAL FUNCTION PANEL
Albumin: 2.1 g/dL — ABNORMAL LOW (ref 3.5–5.0)
Anion gap: 11 (ref 5–15)
BUN: 102 mg/dL — ABNORMAL HIGH (ref 6–20)
CO2: 27 mmol/L (ref 22–32)
Calcium: 9.5 mg/dL (ref 8.9–10.3)
Chloride: 90 mmol/L — ABNORMAL LOW (ref 98–111)
Creatinine, Ser: 4 mg/dL — ABNORMAL HIGH (ref 0.61–1.24)
GFR, Estimated: 18 mL/min — ABNORMAL LOW (ref >60)
Glucose, Bld: 209 mg/dL — ABNORMAL HIGH (ref 70–99)
Phosphorus: 2 mg/dL — ABNORMAL LOW (ref 2.5–4.6)
Potassium: 4.5 mmol/L (ref 3.5–5.1)
Sodium: 128 mmol/L — ABNORMAL LOW (ref 135–145)

## 2020-08-19 LAB — CBC
HCT: 23.8 % — ABNORMAL LOW (ref 39.0–52.0)
Hemoglobin: 7.3 g/dL — ABNORMAL LOW (ref 13.0–17.0)
MCH: 25.5 pg — ABNORMAL LOW (ref 26.0–34.0)
MCHC: 30.7 g/dL (ref 30.0–36.0)
MCV: 83.2 fL (ref 80.0–100.0)
Platelets: 269 10*3/uL (ref 150–400)
RBC: 2.86 MIL/uL — ABNORMAL LOW (ref 4.22–5.81)
RDW: 19.9 % — ABNORMAL HIGH (ref 11.5–15.5)
WBC: 9.4 10*3/uL (ref 4.0–10.5)
nRBC: 0 % (ref 0.0–0.2)

## 2020-08-19 NOTE — Progress Notes (Signed)
Pulmonary [place]  PROGRESS NOTE     Jeffery Andrews  [id number]  DOB: 06/14/1975   DOA: 07/17/2020  Referring Physician: [name], MD  HPI: Jeffery Andrews is a 45 y.o. male being followed for ventilator/airway/oxygen weaning Acute on Chronic Respiratory Failure.  Patient is capping presently comfortable right now without distress  Medications: Reviewed on Rounds  Physical Exam:  Vitals: Temperature is 96.0 pulse 64 respiratory 18 blood pressure is 177/98 saturations 100%  Ventilator Settings capping on room air  General: Comfortable at this time Neck: supple Cardiovascular: no malignant arrhythmias Respiratory: No rhonchi very coarse breath sounds Skin: no rash seen on limited exam Musculoskeletal: No gross abnormality Psychiatric:unable to assess Neurologic:no involuntary movements         Lab Data:   Basic Metabolic Panel: Recent Labs  Lab 08/13/20 0657 08/15/20 0658 08/17/20 1153 08/19/20 0539  NA 135 134* 130* 128*  K 4.2 4.4 4.5 4.5  CL 94* 95* 92* 90*  CO2 '29 29 28 27  '$ GLUCOSE 186* 109* 202* 209*  BUN 100* 101* 97* 102*  CREATININE 4.85* 4.47* 4.17* 4.00*  CALCIUM 9.6 9.6 9.5 9.5  PHOS 2.2* 2.2* 2.0* 2.0*    ABG: No results for input(s): PHART, PCO2ART, PO2ART, HCO3, O2SAT in the last 168 hours.  Liver Function Tests: Recent Labs  Lab 08/13/20 0657 08/15/20 0658 08/17/20 1153 08/19/20 0539  ALBUMIN 1.9* 1.9* 1.9* 2.1*   No results for input(s): LIPASE, AMYLASE in the last 168 hours. No results for input(s): AMMONIA in the last 168 hours.  CBC: Recent Labs  Lab 08/13/20 0657 08/15/20 0658 08/17/20 1153 08/19/20 0539  WBC 14.2* 13.4* 12.1* 9.4  HGB 7.6* 8.0* 7.8* 7.3*  HCT 24.4* 25.4* 24.6* 23.8*  MCV 82.7 82.2 83.1 83.2  PLT 256 257 249 269    Cardiac Enzymes: No results for input(s): CKTOTAL, CKMB, CKMBINDEX, TROPONINI in the last 168  hours.  BNP (last 3 results) No results for input(s): BNP in the last 8760 hours.  ProBNP (last 3 results) No results for input(s): PROBNP in the last 8760 hours.  Radiological Exams: No results found.  Assessment/Plan Active Problems:   Acute on chronic respiratory failure with hypoxia (HCC)   End stage renal disease on dialysis (HCC)   Lobar pneumonia, unspecified organism (HCC)   Chronic diastolic CHF (congestive heart failure) (HCC)   Acute on chronic respiratory failure hypoxia doing well with capping he is actually could be decannulated he has had no secretions and been capping for almost 4 weeks now End-stage renal failure patient is on hemodialysis Lobar pneumonia no change has resolved Chronic diastolic heart failure compensated   I have personally seen and evaluated the patient, evaluated laboratory and imaging results, formulated the assessment and plan and placed orders. The Patient requires high complexity decision making with multiple systems involvement.  Rounds were done with the Respiratory Therapy Director and Staff therapists and discussed with nursing staff also.  Allyne Gee, MD [facility] Pulmonary Critical Care Medicine Sleep Medicine

## 2020-08-19 NOTE — Progress Notes (Signed)
[facility]  ROUNDING NOTE   Subjective:  Patient seen and evaluated during hemodialysis treatment today. Appears to be tolerating well. Ultrafiltration target 2 kg.  Objective:  Vital signs in last 24 hours:  Temperature 96 pulse 69 respirations 18 blood pressure 177/98   Physical Exam: General:  No acute distress  Head:  Normocephalic, atraumatic.    Eyes:  Anicteric  Neck:  Tracheostomy in place  Lungs:   Clear to auscultation, normal effort  Heart:  S1S2 no rubs  Abdomen:   Soft, nontender, bowel sounds present  Extremities:  Trace peripheral edema.  Neurologic:  Neck deviation to the left, contractures in both hands  Skin:  No acute skin rash  Access:  Left upper extremity AV fistula    Basic Metabolic Panel: Recent Labs  Lab 08/13/20 0657 08/15/20 0658 08/17/20 1153 08/19/20 0539  NA 135 134* 130* 128*  K 4.2 4.4 4.5 4.5  CL 94* 95* 92* 90*  CO2 '29 29 28 27  '$ GLUCOSE 186* 109* 202* 209*  BUN 100* 101* 97* 102*  CREATININE 4.85* 4.47* 4.17* 4.00*  CALCIUM 9.6 9.6 9.5 9.5  PHOS 2.2* 2.2* 2.0* 2.0*     Liver Function Tests: Recent Labs  Lab 08/13/20 0657 08/15/20 0658 08/17/20 1153 08/19/20 0539  ALBUMIN 1.9* 1.9* 1.9* 2.1*    No results for input(s): LIPASE, AMYLASE in the last 168 hours. No results for input(s): AMMONIA in the last 168 hours.  CBC: Recent Labs  Lab 08/13/20 0657 08/15/20 0658 08/17/20 1153 08/19/20 0539  WBC 14.2* 13.4* 12.1* 9.4  HGB 7.6* 8.0* 7.8* 7.3*  HCT 24.4* 25.4* 24.6* 23.8*  MCV 82.7 82.2 83.1 83.2  PLT 256 257 249 269     Cardiac Enzymes: No results for input(s): CKTOTAL, CKMB, CKMBINDEX, TROPONINI in the last 168 hours.  BNP: Invalid input(s): POCBNP  CBG: No results for input(s): GLUCAP in the last 168 hours.  Microbiology: Results for orders placed or performed during the hospital encounter of 07/17/20  Culture, blood (routine x 2)     Status: None   Collection Time: 07/27/20 11:17 AM    Specimen: BLOOD RIGHT ARM  Result Value Ref Range Status   Specimen Description BLOOD RIGHT ARM  Final   Special Requests   Final    BOTTLES DRAWN AEROBIC ONLY Blood Culture results may not be optimal due to an inadequate volume of blood received in culture bottles   Culture   Final    NO GROWTH 5 DAYS Performed at [facility], [place] [address], [place], [place] [address]    Report Status 08/01/2020 FINAL  Final  Culture, blood (routine x 2)     Status: None   Collection Time: 07/27/20 11:23 AM   Specimen: BLOOD RIGHT HAND  Result Value Ref Range Status   Specimen Description BLOOD RIGHT HAND  Final   Special Requests   Final    BOTTLES DRAWN AEROBIC ONLY Blood Culture results may not be optimal due to an inadequate volume of blood received in culture bottles   Culture   Final    NO GROWTH 5 DAYS Performed at [facility], [place] [address] [place], [place] [address]    Report Status 08/01/2020 FINAL  Final  Culture, blood (routine x 2)     Status: None   Collection Time: 08/01/20  2:25 PM   Specimen: BLOOD RIGHT HAND  Result Value Ref Range Status   Specimen Description BLOOD RIGHT HAND  Final  Special Requests   Final    BOTTLES DRAWN AEROBIC AND ANAEROBIC Blood Culture results may not be optimal due to an inadequate volume of blood received in culture bottles   Culture   Final    NO GROWTH 5 DAYS Performed at [facility], [place] [address], [place], [place] [address]    Report Status 08/06/2020 FINAL  Final  Culture, blood (routine x 2)     Status: None   Collection Time: 08/01/20  2:39 PM   Specimen: BLOOD RIGHT ARM  Result Value Ref Range Status   Specimen Description BLOOD RIGHT ARM  Final   Special Requests   Final    BOTTLES DRAWN AEROBIC ONLY Blood Culture results may not be optimal due to an inadequate volume of blood received in culture bottles   Culture   Final    NO GROWTH 5 DAYS Performed at [facility], [place] [address]  [place], [place] [address]    Report Status 08/06/2020 FINAL  Final  Culture, Respiratory w Gram Stain     Status: None   Collection Time: 08/01/20  2:55 PM   Specimen: Tracheal Aspirate; Respiratory  Result Value Ref Range Status   Specimen Description TRACHEAL ASPIRATE  Final   Special Requests NONE  Final   Gram Stain   Final    FEW WBC PRESENT, PREDOMINANTLY PMN MODERATE GRAM NEGATIVE RODS Performed at [facility], [place] [address] [place], [place] [address]    Culture ABUNDANT PSEUDOMONAS AERUGINOSA  Final   Report Status 08/04/2020 FINAL  Final   Organism ID, Bacteria PSEUDOMONAS AERUGINOSA  Final      Susceptibility   Pseudomonas aeruginosa - MIC*    CEFTAZIDIME <=1 SENSITIVE Sensitive     CIPROFLOXACIN <=0.25 SENSITIVE Sensitive     GENTAMICIN 8 INTERMEDIATE Intermediate     IMIPENEM >=16 RESISTANT Resistant     PIP/TAZO <=4 SENSITIVE Sensitive     CEFEPIME 2 SENSITIVE Sensitive     * ABUNDANT PSEUDOMONAS AERUGINOSA    Coagulation Studies: No results for input(s): LABPROT, INR in the last 72 hours.   Urinalysis: No results for input(s): COLORURINE, LABSPEC, PHURINE, GLUCOSEU, HGBUR, BILIRUBINUR, KETONESUR, PROTEINUR, UROBILINOGEN, NITRITE, LEUKOCYTESUR in the last 72 hours.  Invalid input(s): APPERANCEUR    Imaging: No results found.   Medications:       Assessment/ Plan:  45 y.o. male with a PMHx of ESRD on HD, posterior reversible encephalopathy syndrome, diabetes mellitus type 2, chronic systolic heart failure, anemia of chronic kidney disease, secondary hyperparathyroidism, malignant hypertension, acute respiratory failure, who was admitted to [facility] on 06/29/2020 for ongoing treatment of acute respiratory failure, posterior reversible encephalopathy syndrome, and end-stage renal disease.   1.  ESRD on HD.  Patient seen and evaluated during hemodialysis treatment.  Ultrafiltration target 2 kg.  2.  Anemia of chronic kidney disease.  Hemoglobin  drifting down again and currently 7.3.  Consider blood transfusion for hemoglobin of 7 or less.  3.  Secondary hyperparathyroidism.  Phosphorus stable at 2.0.  Continue to monitor serum phosphorus trend.  4.  Acute respiratory failure.  Tracheostomy placed 07/17/2020.  Tracheostomy capped at the moment.  5.  Hypertension.  Continue amlodipine, hydralazine, clonidine, labetalol, and losartan.      LOS: 0 Jolana Runkles 8/17/20223:25 PM

## 2020-08-20 DIAGNOSIS — I5032 Chronic diastolic (congestive) heart failure: Secondary | ICD-10-CM | POA: Diagnosis not present

## 2020-08-20 DIAGNOSIS — J181 Lobar pneumonia, unspecified organism: Secondary | ICD-10-CM | POA: Diagnosis not present

## 2020-08-20 DIAGNOSIS — N186 End stage renal disease: Secondary | ICD-10-CM | POA: Diagnosis not present

## 2020-08-20 DIAGNOSIS — J9621 Acute and chronic respiratory failure with hypoxia: Secondary | ICD-10-CM | POA: Diagnosis not present

## 2020-08-20 NOTE — Progress Notes (Signed)
Pulmonary [place]  PROGRESS NOTE     Jeffery Andrews  [id number]  DOB: [birth year]   DOA: 07/17/2020  Referring Physician: [name], MD  HPI: Jeffery Andrews is a 45 y.o. male being followed for ventilator/airway/oxygen weaning Acute on Chronic Respiratory Failure.  Currently afebrile without distress at this time has been capping on room air  Medications: Reviewed on Rounds  Physical Exam:  Vitals: Temperature is 98.2 pulse 68 respiratory rate is 18 blood pressure is 173/88 saturations 100%  Ventilator Settings capping on room air at this time  General: Comfortable at this time Neck: supple Cardiovascular: no malignant arrhythmias Respiratory: No rhonchi very coarse breath sounds Skin: no rash seen on limited exam Musculoskeletal: No gross abnormality Psychiatric:unable to assess Neurologic:no involuntary movements         Lab Data:   Basic Metabolic Panel: Recent Labs  Lab 08/15/20 0658 08/17/20 1153 08/19/20 0539  NA 134* 130* 128*  K 4.4 4.5 4.5  CL 95* 92* 90*  CO2 '29 28 27  '$ GLUCOSE 109* 202* 209*  BUN 101* 97* 102*  CREATININE 4.47* 4.17* 4.00*  CALCIUM 9.6 9.5 9.5  PHOS 2.2* 2.0* 2.0*    ABG: No results for input(s): PHART, PCO2ART, PO2ART, HCO3, O2SAT in the last 168 hours.  Liver Function Tests: Recent Labs  Lab 08/15/20 0658 08/17/20 1153 08/19/20 0539  ALBUMIN 1.9* 1.9* 2.1*   No results for input(s): LIPASE, AMYLASE in the last 168 hours. No results for input(s): AMMONIA in the last 168 hours.  CBC: Recent Labs  Lab 08/15/20 0658 08/17/20 1153 08/19/20 0539  WBC 13.4* 12.1* 9.4  HGB 8.0* 7.8* 7.3*  HCT 25.4* 24.6* 23.8*  MCV 82.2 83.1 83.2  PLT 257 249 269    Cardiac Enzymes: No results for input(s): CKTOTAL, CKMB, CKMBINDEX, TROPONINI in the last 168 hours.  BNP (last 3 results) No results for input(s): BNP in the last 8760  hours.  ProBNP (last 3 results) No results for input(s): PROBNP in the last 8760 hours.  Radiological Exams: No results found.  Assessment/Plan Active Problems:   Acute on chronic respiratory failure with hypoxia (HCC)   End stage renal disease on dialysis (HCC)   Lobar pneumonia, unspecified organism (HCC)   Chronic diastolic CHF (congestive heart failure) (HCC)   Acute on chronic respiratory failure with hypoxia patient has done extremely well with the capping working on discharge planning for home dialysis End-stage renal failure patient is on dialysis Lobar pneumonia treated Chronic diastolic heart failure compensated   I have personally seen and evaluated the patient, evaluated laboratory and imaging results, formulated the assessment and plan and placed orders. The Patient requires high complexity decision making with multiple systems involvement.  Rounds were done with the Respiratory Therapy Director and Staff therapists and discussed with nursing staff also.  Allyne Gee, MD [facility] Pulmonary Critical Care Medicine Sleep Medicine

## 2020-08-21 DIAGNOSIS — J9621 Acute and chronic respiratory failure with hypoxia: Secondary | ICD-10-CM | POA: Diagnosis not present

## 2020-08-21 DIAGNOSIS — I5032 Chronic diastolic (congestive) heart failure: Secondary | ICD-10-CM | POA: Diagnosis not present

## 2020-08-21 DIAGNOSIS — J181 Lobar pneumonia, unspecified organism: Secondary | ICD-10-CM | POA: Diagnosis not present

## 2020-08-21 DIAGNOSIS — N186 End stage renal disease: Secondary | ICD-10-CM | POA: Diagnosis not present

## 2020-08-21 LAB — RENAL FUNCTION PANEL
Albumin: 2 g/dL — ABNORMAL LOW (ref 3.5–5.0)
Anion gap: 10 (ref 5–15)
BUN: 81 mg/dL — ABNORMAL HIGH (ref 6–20)
CO2: 29 mmol/L (ref 22–32)
Calcium: 9.6 mg/dL (ref 8.9–10.3)
Chloride: 91 mmol/L — ABNORMAL LOW (ref 98–111)
Creatinine, Ser: 3.83 mg/dL — ABNORMAL HIGH (ref 0.61–1.24)
GFR, Estimated: 19 mL/min — ABNORMAL LOW (ref >60)
Glucose, Bld: 145 mg/dL — ABNORMAL HIGH (ref 70–99)
Phosphorus: 2.6 mg/dL (ref 2.5–4.6)
Potassium: 3.8 mmol/L (ref 3.5–5.1)
Sodium: 130 mmol/L — ABNORMAL LOW (ref 135–145)

## 2020-08-21 LAB — CBC
HCT: 23.1 % — ABNORMAL LOW (ref 39.0–52.0)
Hemoglobin: 7.1 g/dL — ABNORMAL LOW (ref 13.0–17.0)
MCH: 25.4 pg — ABNORMAL LOW (ref 26.0–34.0)
MCHC: 30.7 g/dL (ref 30.0–36.0)
MCV: 82.8 fL (ref 80.0–100.0)
Platelets: 264 10*3/uL (ref 150–400)
RBC: 2.79 MIL/uL — ABNORMAL LOW (ref 4.22–5.81)
RDW: 20 % — ABNORMAL HIGH (ref 11.5–15.5)
WBC: 8.5 10*3/uL (ref 4.0–10.5)
nRBC: 0 % (ref 0.0–0.2)

## 2020-08-21 NOTE — Progress Notes (Signed)
[facility]  ROUNDING NOTE   Subjective:  Patient seen and evaluated at bedside during hemodialysis treatment. Appears to be tolerating well. Ultrafiltration target 2 kg..  Objective:  Vital signs in last 24 hours:  Temperature 98.9 pulse 63 respirations 12 blood pressure 164/89  Physical Exam: General:  No acute distress  Head:  Normocephalic, atraumatic.    Eyes:  Anicteric  Neck:  Tracheostomy in place  Lungs:   Clear to auscultation, normal effort  Heart:  S1S2 no rubs  Abdomen:   Soft, nontender, bowel sounds present  Extremities:  Trace peripheral edema.  Neurologic:  Neck deviation to the left, contractures in both hands  Skin:  No acute skin rash  Access:  Left upper extremity AV fistula    Basic Metabolic Panel: Recent Labs  Lab 08/15/20 0658 08/17/20 1153 08/19/20 0539 08/21/20 0433  NA 134* 130* 128* 130*  K 4.4 4.5 4.5 3.8  CL 95* 92* 90* 91*  CO2 '29 28 27 29  '$ GLUCOSE 109* 202* 209* 145*  BUN 101* 97* 102* 81*  CREATININE 4.47* 4.17* 4.00* 3.83*  CALCIUM 9.6 9.5 9.5 9.6  PHOS 2.2* 2.0* 2.0* 2.6     Liver Function Tests: Recent Labs  Lab 08/15/20 0658 08/17/20 1153 08/19/20 0539 08/21/20 0433  ALBUMIN 1.9* 1.9* 2.1* 2.0*    No results for input(s): LIPASE, AMYLASE in the last 168 hours. No results for input(s): AMMONIA in the last 168 hours.  CBC: Recent Labs  Lab 08/15/20 0658 08/17/20 1153 08/19/20 0539 08/21/20 0433  WBC 13.4* 12.1* 9.4 8.5  HGB 8.0* 7.8* 7.3* 7.1*  HCT 25.4* 24.6* 23.8* 23.1*  MCV 82.2 83.1 83.2 82.8  PLT 257 249 269 264     Cardiac Enzymes: No results for input(s): CKTOTAL, CKMB, CKMBINDEX, TROPONINI in the last 168 hours.  BNP: Invalid input(s): POCBNP  CBG: No results for input(s): GLUCAP in the last 168 hours.  Microbiology: Results for orders placed or performed during the hospital encounter of 07/17/20  Culture, blood (routine x 2)     Status: None   Collection Time: 07/27/20 11:17  AM   Specimen: BLOOD RIGHT ARM  Result Value Ref Range Status   Specimen Description BLOOD RIGHT ARM  Final   Special Requests   Final    BOTTLES DRAWN AEROBIC ONLY Blood Culture results may not be optimal due to an inadequate volume of blood received in culture bottles   Culture   Final    NO GROWTH 5 DAYS Performed at [facility], [place] [address], [place], [place] [address]    Report Status 08/01/2020 FINAL  Final  Culture, blood (routine x 2)     Status: None   Collection Time: 07/27/20 11:23 AM   Specimen: BLOOD RIGHT HAND  Result Value Ref Range Status   Specimen Description BLOOD RIGHT HAND  Final   Special Requests   Final    BOTTLES DRAWN AEROBIC ONLY Blood Culture results may not be optimal due to an inadequate volume of blood received in culture bottles   Culture   Final    NO GROWTH 5 DAYS Performed at [facility], [place] [address], [place], [place] [address]    Report Status 08/01/2020 FINAL  Final  Culture, blood (routine x 2)     Status: None   Collection Time: 08/01/20  2:25 PM   Specimen: BLOOD RIGHT HAND  Result Value Ref Range Status   Specimen Description BLOOD RIGHT HAND  Final  Special Requests   Final    BOTTLES DRAWN AEROBIC AND ANAEROBIC Blood Culture results may not be optimal due to an inadequate volume of blood received in culture bottles   Culture   Final    NO GROWTH 5 DAYS Performed at [facility], [place] [address], [place], [place] [address]    Report Status 08/06/2020 FINAL  Final  Culture, blood (routine x 2)     Status: None   Collection Time: 08/01/20  2:39 PM   Specimen: BLOOD RIGHT ARM  Result Value Ref Range Status   Specimen Description BLOOD RIGHT ARM  Final   Special Requests   Final    BOTTLES DRAWN AEROBIC ONLY Blood Culture results may not be optimal due to an inadequate volume of blood received in culture bottles   Culture   Final    NO GROWTH 5 DAYS Performed at [facility], [place] [address],  [place], [place] [address]    Report Status 08/06/2020 FINAL  Final  Culture, Respiratory w Gram Stain     Status: None   Collection Time: 08/01/20  2:55 PM   Specimen: Tracheal Aspirate; Respiratory  Result Value Ref Range Status   Specimen Description TRACHEAL ASPIRATE  Final   Special Requests NONE  Final   Gram Stain   Final    FEW WBC PRESENT, PREDOMINANTLY PMN MODERATE GRAM NEGATIVE RODS Performed at [facility], [place] [address] [place], [place] [address]    Culture ABUNDANT PSEUDOMONAS AERUGINOSA  Final   Report Status 08/04/2020 FINAL  Final   Organism ID, Bacteria PSEUDOMONAS AERUGINOSA  Final      Susceptibility   Pseudomonas aeruginosa - MIC*    CEFTAZIDIME <=1 SENSITIVE Sensitive     CIPROFLOXACIN <=0.25 SENSITIVE Sensitive     GENTAMICIN 8 INTERMEDIATE Intermediate     IMIPENEM >=16 RESISTANT Resistant     PIP/TAZO <=4 SENSITIVE Sensitive     CEFEPIME 2 SENSITIVE Sensitive     * ABUNDANT PSEUDOMONAS AERUGINOSA    Coagulation Studies: No results for input(s): LABPROT, INR in the last 72 hours.   Urinalysis: No results for input(s): COLORURINE, LABSPEC, PHURINE, GLUCOSEU, HGBUR, BILIRUBINUR, KETONESUR, PROTEINUR, UROBILINOGEN, NITRITE, LEUKOCYTESUR in the last 72 hours.  Invalid input(s): APPERANCEUR    Imaging: No results found.   Medications:       Assessment/ Plan:  45 y.o. male with a PMHx of ESRD on HD, posterior reversible encephalopathy syndrome, diabetes mellitus type 2, chronic systolic heart failure, anemia of chronic kidney disease, secondary hyperparathyroidism, malignant hypertension, acute respiratory failure, who was admitted to [facility] on 06/29/2020 for ongoing treatment of acute respiratory failure, posterior reversible encephalopathy syndrome, and end-stage renal disease.   1.  ESRD on HD.  Patient seen during hemodialysis treatment this AM.  He appears to be tolerating the treatment well.  Ultrafiltration target currently 2 kg.   We will plan to complete dialysis treatment today and plan for dialysis again on Monday.  2.  Anemia of chronic kidney disease.  Hemoglobin drifting down again and currently 7.1.  Consider blood transfusion as he is required these previously.  Otherwise maintain the patient on Retacrit.  3.  Secondary hyperparathyroidism.  Phosphorus 2.6 and acceptable.  4.  Acute respiratory failure.  Tracheostomy placed 07/17/2020.  Tracheostomy capped at the moment.  5.  Hypertension.  Continue amlodipine, hydralazine, clonidine, labetalol, and losartan.  Blood pressure 164/89 this AM.    LOS: 0 Willma Obando 8/19/20229:00 AM

## 2020-08-21 NOTE — Progress Notes (Signed)
[facility] [place]  PROGRESS NOTE     Jeffery Andrews  [id number]  DOB: 1975-01-15   DOA: 07/17/2020  Referring Physician: [name], MD  HPI: Jeffery Andrews is a 45 y.o. male being followed for ventilator/airway/oxygen weaning Acute on Chronic Respiratory Failure.  Patient is capping currently on room air without any distress at this time.  Medications: Reviewed on Rounds  Physical Exam:  Vitals: Temperature is 98.9 pulse 63 respiratory 20 blood pressure is 164/89 saturations 99%  Ventilator Settings capping on room air  General: Comfortable at this time Neck: supple Cardiovascular: no malignant arrhythmias Respiratory: No rhonchi no rales are noted at this time Skin: no rash seen on limited exam Musculoskeletal: No gross abnormality Psychiatric:unable to assess Neurologic:no involuntary movements         Lab Data:   Basic Metabolic Panel: Recent Labs  Lab 08/15/20 0658 08/17/20 1153 08/19/20 0539 08/21/20 0433  NA 134* 130* 128* 130*  K 4.4 4.5 4.5 3.8  CL 95* 92* 90* 91*  CO2 '29 28 27 29  '$ GLUCOSE 109* 202* 209* 145*  BUN 101* 97* 102* 81*  CREATININE 4.47* 4.17* 4.00* 3.83*  CALCIUM 9.6 9.5 9.5 9.6  PHOS 2.2* 2.0* 2.0* 2.6    ABG: No results for input(s): PHART, PCO2ART, PO2ART, HCO3, O2SAT in the last 168 hours.  Liver Function Tests: Recent Labs  Lab 08/15/20 0658 08/17/20 1153 08/19/20 0539 08/21/20 0433  ALBUMIN 1.9* 1.9* 2.1* 2.0*   No results for input(s): LIPASE, AMYLASE in the last 168 hours. No results for input(s): AMMONIA in the last 168 hours.  CBC: Recent Labs  Lab 08/15/20 0658 08/17/20 1153 08/19/20 0539 08/21/20 0433  WBC 13.4* 12.1* 9.4 8.5  HGB 8.0* 7.8* 7.3* 7.1*  HCT 25.4* 24.6* 23.8* 23.1*  MCV 82.2 83.1 83.2 82.8  PLT 257 249 269 264    Cardiac Enzymes: No results for input(s): CKTOTAL, CKMB, CKMBINDEX, TROPONINI in  the last 168 hours.  BNP (last 3 results) No results for input(s): BNP in the last 8760 hours.  ProBNP (last 3 results) No results for input(s): PROBNP in the last 8760 hours.  Radiological Exams: No results found.  Assessment/Plan Active Problems:   Acute on chronic respiratory failure with hypoxia (HCC)   End stage renal disease on dialysis (HCC)   Lobar pneumonia, unspecified organism (HCC)   Chronic diastolic CHF (congestive heart failure) (HCC)   Acute on chronic respiratory failure with hypoxia we will continue with capping working towards decannulation possibly next week depending on discharge planning End-stage renal failure on dialysis hemodynamics are stable Lobar pneumonia improved Chronic diastolic heart failure compensated   I have personally seen and evaluated the patient, evaluated laboratory and imaging results, formulated the assessment and plan and placed orders. The Patient requires high complexity decision making with multiple systems involvement.  Rounds were done with the Respiratory Therapy Director and Staff therapists and discussed with nursing staff also.  Allyne Gee, MD [facility] Pulmonary Critical Care Medicine Sleep Medicine

## 2020-08-22 DIAGNOSIS — N186 End stage renal disease: Secondary | ICD-10-CM | POA: Diagnosis not present

## 2020-08-22 DIAGNOSIS — J9621 Acute and chronic respiratory failure with hypoxia: Secondary | ICD-10-CM | POA: Diagnosis not present

## 2020-08-22 DIAGNOSIS — I5032 Chronic diastolic (congestive) heart failure: Secondary | ICD-10-CM | POA: Diagnosis not present

## 2020-08-22 DIAGNOSIS — J181 Lobar pneumonia, unspecified organism: Secondary | ICD-10-CM | POA: Diagnosis not present

## 2020-08-22 NOTE — Progress Notes (Signed)
[facility] [place]  PROGRESS NOTE     SAADIQ OLMSTED  [id number]  DOB: [birth year]   DOA: 07/17/2020  Referring Physician: Satira Sark, MD  HPI: Jeffery Andrews is a 45 y.o. male being followed for ventilator/airway/oxygen weaning Acute on Chronic Respiratory Failure.  Doing well with capping has had no issues good strong cough no secretions  Medications: Reviewed on Rounds  Physical Exam:  Vitals: Temperature is 97.9 pulse 65 respiratory rate is 12 blood pressure is 155/83 saturations 99%  Ventilator Settings capping on room air  General: Comfortable at this time Neck: supple Cardiovascular: no malignant arrhythmias Respiratory: No rhonchi no rales are noted at this time Skin: no rash seen on limited exam Musculoskeletal: No gross abnormality Psychiatric:unable to assess Neurologic:no involuntary movements         Lab Data:   Basic Metabolic Panel: Recent Labs  Lab 08/17/20 1153 08/19/20 0539 08/21/20 0433  NA 130* 128* 130*  K 4.5 4.5 3.8  CL 92* 90* 91*  CO2 '28 27 29  '$ GLUCOSE 202* 209* 145*  BUN 97* 102* 81*  CREATININE 4.17* 4.00* 3.83*  CALCIUM 9.5 9.5 9.6  PHOS 2.0* 2.0* 2.6    ABG: No results for input(s): PHART, PCO2ART, PO2ART, HCO3, O2SAT in the last 168 hours.  Liver Function Tests: Recent Labs  Lab 08/17/20 1153 08/19/20 0539 08/21/20 0433  ALBUMIN 1.9* 2.1* 2.0*   No results for input(s): LIPASE, AMYLASE in the last 168 hours. No results for input(s): AMMONIA in the last 168 hours.  CBC: Recent Labs  Lab 08/17/20 1153 08/19/20 0539 08/21/20 0433  WBC 12.1* 9.4 8.5  HGB 7.8* 7.3* 7.1*  HCT 24.6* 23.8* 23.1*  MCV 83.1 83.2 82.8  PLT 249 269 264    Cardiac Enzymes: No results for input(s): CKTOTAL, CKMB, CKMBINDEX, TROPONINI in the last 168 hours.  BNP (last 3 results) No results for input(s): BNP in the last 8760 hours.  ProBNP  (last 3 results) No results for input(s): PROBNP in the last 8760 hours.  Radiological Exams: No results found.  Assessment/Plan Active Problems:   Acute on chronic respiratory failure with hypoxia (HCC)   End stage renal disease on dialysis (HCC)   Lobar pneumonia, unspecified organism (HCC)   Chronic diastolic CHF (congestive heart failure) (HCC)   Acute on chronic respiratory failure hypoxia plan is going to be to continue with the capping trials titrate oxygen as tolerated continue pulmonary toilet.  Hopefully should be able to decannulate soon Lobar pneumonia treated with antibiotics continue to monitor closely. Chronic diastolic heart failure compensated End-stage renal failure on hemodialysis   I have personally seen and evaluated the patient, evaluated laboratory and imaging results, formulated the assessment and plan and placed orders. The Patient requires high complexity decision making with multiple systems involvement.  Rounds were done with the Respiratory Therapy Director and Staff therapists and discussed with nursing staff also.  Allyne Gee, MD [name] Pulmonary Critical Care Medicine Sleep Medicine

## 2020-08-24 DIAGNOSIS — J181 Lobar pneumonia, unspecified organism: Secondary | ICD-10-CM | POA: Diagnosis not present

## 2020-08-24 DIAGNOSIS — J9621 Acute and chronic respiratory failure with hypoxia: Secondary | ICD-10-CM | POA: Diagnosis not present

## 2020-08-24 DIAGNOSIS — N186 End stage renal disease: Secondary | ICD-10-CM | POA: Diagnosis not present

## 2020-08-24 DIAGNOSIS — I5032 Chronic diastolic (congestive) heart failure: Secondary | ICD-10-CM | POA: Diagnosis not present

## 2020-08-24 LAB — RENAL FUNCTION PANEL
Albumin: 2.1 g/dL — ABNORMAL LOW (ref 3.5–5.0)
Anion gap: 10 (ref 5–15)
BUN: 110 mg/dL — ABNORMAL HIGH (ref 6–20)
CO2: 29 mmol/L (ref 22–32)
Calcium: 9.8 mg/dL (ref 8.9–10.3)
Chloride: 91 mmol/L — ABNORMAL LOW (ref 98–111)
Creatinine, Ser: 5.13 mg/dL — ABNORMAL HIGH (ref 0.61–1.24)
GFR, Estimated: 13 mL/min — ABNORMAL LOW (ref >60)
Glucose, Bld: 139 mg/dL — ABNORMAL HIGH (ref 70–99)
Phosphorus: 2.4 mg/dL — ABNORMAL LOW (ref 2.5–4.6)
Potassium: 4.4 mmol/L (ref 3.5–5.1)
Sodium: 130 mmol/L — ABNORMAL LOW (ref 135–145)

## 2020-08-24 LAB — CBC
HCT: 23.6 % — ABNORMAL LOW (ref 39.0–52.0)
Hemoglobin: 7.5 g/dL — ABNORMAL LOW (ref 13.0–17.0)
MCH: 25.8 pg — ABNORMAL LOW (ref 26.0–34.0)
MCHC: 31.8 g/dL (ref 30.0–36.0)
MCV: 81.1 fL (ref 80.0–100.0)
Platelets: 258 10*3/uL (ref 150–400)
RBC: 2.91 MIL/uL — ABNORMAL LOW (ref 4.22–5.81)
RDW: 19.1 % — ABNORMAL HIGH (ref 11.5–15.5)
WBC: 9.2 10*3/uL (ref 4.0–10.5)
nRBC: 0 % (ref 0.0–0.2)

## 2020-08-24 NOTE — Progress Notes (Signed)
[facility]  ROUNDING NOTE   Subjective:  Patient due for hemodialysis treatment today. Currently resting comfortably in bed. Does appear to track with eyes at times.  Objective:  Vital signs in last 24 hours:  Temperature 98.5 pulse 80 respiration 17 blood pressure 160/92 but as low as 146/83  Physical Exam: General:  No acute distress  Head:  Normocephalic, atraumatic.    Eyes:  Anicteric  Neck:  Tracheostomy in place  Lungs:   Clear to auscultation, normal effort  Heart:  S1S2 no rubs  Abdomen:   Soft, nontender, bowel sounds present  Extremities:  Trace peripheral edema.  Neurologic:  Neck deviation to the left, contractures in both hands  Skin:  No acute skin rash  Access:  Left upper extremity AV fistula    Basic Metabolic Panel: Recent Labs  Lab 08/17/20 1153 08/19/20 0539 08/21/20 0433  NA 130* 128* 130*  K 4.5 4.5 3.8  CL 92* 90* 91*  CO2 '28 27 29  '$ GLUCOSE 202* 209* 145*  BUN 97* 102* 81*  CREATININE 4.17* 4.00* 3.83*  CALCIUM 9.5 9.5 9.6  PHOS 2.0* 2.0* 2.6     Liver Function Tests: Recent Labs  Lab 08/17/20 1153 08/19/20 0539 08/21/20 0433  ALBUMIN 1.9* 2.1* 2.0*    No results for input(s): LIPASE, AMYLASE in the last 168 hours. No results for input(s): AMMONIA in the last 168 hours.  CBC: Recent Labs  Lab 08/17/20 1153 08/19/20 0539 08/21/20 0433  WBC 12.1* 9.4 8.5  HGB 7.8* 7.3* 7.1*  HCT 24.6* 23.8* 23.1*  MCV 83.1 83.2 82.8  PLT 249 269 264     Cardiac Enzymes: No results for input(s): CKTOTAL, CKMB, CKMBINDEX, TROPONINI in the last 168 hours.  BNP: Invalid input(s): POCBNP  CBG: No results for input(s): GLUCAP in the last 168 hours.  Microbiology: Results for orders placed or performed during the hospital encounter of 07/17/20  Culture, blood (routine x 2)     Status: None   Collection Time: 07/27/20 11:17 AM   Specimen: BLOOD RIGHT ARM  Result Value Ref Range Status   Specimen Description BLOOD RIGHT  ARM  Final   Special Requests   Final    BOTTLES DRAWN AEROBIC ONLY Blood Culture results may not be optimal due to an inadequate volume of blood received in culture bottles   Culture   Final    NO GROWTH 5 DAYS Performed at [facility], [place] [address] [place], [place] [address]    Report Status 08/01/2020 FINAL  Final  Culture, blood (routine x 2)     Status: None   Collection Time: 07/27/20 11:23 AM   Specimen: BLOOD RIGHT HAND  Result Value Ref Range Status   Specimen Description BLOOD RIGHT HAND  Final   Special Requests   Final    BOTTLES DRAWN AEROBIC ONLY Blood Culture results may not be optimal due to an inadequate volume of blood received in culture bottles   Culture   Final    NO GROWTH 5 DAYS Performed at [facility], [place] [address] [place], [place] [address]    Report Status 08/01/2020 FINAL  Final  Culture, blood (routine x 2)     Status: None   Collection Time: 08/01/20  2:25 PM   Specimen: BLOOD RIGHT HAND  Result Value Ref Range Status   Specimen Description BLOOD RIGHT HAND  Final   Special Requests   Final    BOTTLES DRAWN AEROBIC AND ANAEROBIC  Blood Culture results may not be optimal due to an inadequate volume of blood received in culture bottles   Culture   Final    NO GROWTH 5 DAYS Performed at [place] [address], [place], [place] [address]    Report Status 08/06/2020 FINAL  Final  Culture, blood (routine x 2)     Status: None   Collection Time: 08/01/20  2:39 PM   Specimen: BLOOD RIGHT ARM  Result Value Ref Range Status   Specimen Description BLOOD RIGHT ARM  Final   Special Requests   Final    BOTTLES DRAWN AEROBIC ONLY Blood Culture results may not be optimal due to an inadequate volume of blood received in culture bottles   Culture   Final    NO GROWTH 5 DAYS Performed at [facility], [place] [address], [place],  [address]    Report Status 08/06/2020 FINAL  Final  Culture, Respiratory w Gram Stain      Status: None   Collection Time: 08/01/20  2:55 PM   Specimen: Tracheal Aspirate; Respiratory  Result Value Ref Range Status   Specimen Description TRACHEAL ASPIRATE  Final   Special Requests NONE  Final   Gram Stain   Final    FEW WBC PRESENT, PREDOMINANTLY PMN MODERATE GRAM NEGATIVE RODS Performed at [facility], [place] [address] [place],  [address]    Culture ABUNDANT PSEUDOMONAS AERUGINOSA  Final   Report Status 08/04/2020 FINAL  Final   Organism ID, Bacteria PSEUDOMONAS AERUGINOSA  Final      Susceptibility   Pseudomonas aeruginosa - MIC*    CEFTAZIDIME <=1 SENSITIVE Sensitive     CIPROFLOXACIN <=0.25 SENSITIVE Sensitive     GENTAMICIN 8 INTERMEDIATE Intermediate     IMIPENEM >=16 RESISTANT Resistant     PIP/TAZO <=4 SENSITIVE Sensitive     CEFEPIME 2 SENSITIVE Sensitive     * ABUNDANT PSEUDOMONAS AERUGINOSA    Coagulation Studies: No results for input(s): LABPROT, INR in the last 72 hours.   Urinalysis: No results for input(s): COLORURINE, LABSPEC, PHURINE, GLUCOSEU, HGBUR, BILIRUBINUR, KETONESUR, PROTEINUR, UROBILINOGEN, NITRITE, LEUKOCYTESUR in the last 72 hours.  Invalid input(s): APPERANCEUR    Imaging: No results found.   Medications:       Assessment/ Plan:  45 y.o. male with a PMHx of ESRD on HD, posterior reversible encephalopathy syndrome, diabetes mellitus type 2, chronic systolic heart failure, anemia of chronic kidney disease, secondary hyperparathyroidism, malignant hypertension, acute respiratory failure, who was admitted to [facility] on 06/29/2020 for ongoing treatment of acute respiratory failure, posterior reversible encephalopathy syndrome, and end-stage renal disease.   1.  ESRD on HD.  Patient is due for hemodialysis treatment today.  Orders have been prepared.  2.  Anemia of chronic kidney disease.  Hemoglobin down a bit to 7.1.  Consider blood transfusion but defer to primary team.  3.  Secondary hyperparathyroidism.   Recheck serum phosphorus today.  4.  Acute respiratory failure.  Tracheostomy placed 07/17/2020.  Tracheostomy capped at the moment.  5.  Hypertension.  Continue amlodipine, hydralazine, clonidine, labetalol, and losartan.  Blood pressure has been as low as 146/83 over the past 24 hours.    LOS: 0 See Beharry 8/22/20228:04 AM

## 2020-08-24 NOTE — Progress Notes (Signed)
Pulmonary [place]  PROGRESS NOTE     CY BARRISH  [id number]  DOB: March 06, 1975   DOA: 07/17/2020  Referring Physician: Satira Sark, MD  HPI: KEATAN VANDEVOORT is a 45 y.o. male being followed for ventilator/airway/oxygen weaning Acute on Chronic Respiratory Failure.  Patient is capping currently on room air without distress.  Medications: Reviewed on Rounds  Physical Exam:  Vitals: Temperature is 98.5 pulse 70 respiratory rate 17 blood pressure 160/90 saturations 100%  Ventilator Settings capping off the ventilator  General: Comfortable at this time Neck: supple Cardiovascular: no malignant arrhythmias Respiratory: No rhonchi very coarse breath sounds Skin: no rash seen on limited exam Musculoskeletal: No gross abnormality Psychiatric:unable to assess Neurologic:no involuntary movements         Lab Data:   Basic Metabolic Panel: Recent Labs  Lab 08/17/20 1153 08/19/20 0539 08/21/20 0433  NA 130* 128* 130*  K 4.5 4.5 3.8  CL 92* 90* 91*  CO2 '28 27 29  '$ GLUCOSE 202* 209* 145*  BUN 97* 102* 81*  CREATININE 4.17* 4.00* 3.83*  CALCIUM 9.5 9.5 9.6  PHOS 2.0* 2.0* 2.6    ABG: No results for input(s): PHART, PCO2ART, PO2ART, HCO3, O2SAT in the last 168 hours.  Liver Function Tests: Recent Labs  Lab 08/17/20 1153 08/19/20 0539 08/21/20 0433  ALBUMIN 1.9* 2.1* 2.0*   No results for input(s): LIPASE, AMYLASE in the last 168 hours. No results for input(s): AMMONIA in the last 168 hours.  CBC: Recent Labs  Lab 08/17/20 1153 08/19/20 0539 08/21/20 0433  WBC 12.1* 9.4 8.5  HGB 7.8* 7.3* 7.1*  HCT 24.6* 23.8* 23.1*  MCV 83.1 83.2 82.8  PLT 249 269 264    Cardiac Enzymes: No results for input(s): CKTOTAL, CKMB, CKMBINDEX, TROPONINI in the last 168 hours.  BNP (last 3 results) No results for input(s): BNP in the last 8760 hours.  ProBNP (last 3  results) No results for input(s): PROBNP in the last 8760 hours.  Radiological Exams: No results found.  Assessment/Plan Active Problems:   Acute on chronic respiratory failure with hypoxia (HCC)   End stage renal disease on dialysis (HCC)   Lobar pneumonia, unspecified organism (HCC)   Chronic diastolic CHF (congestive heart failure) (HCC)   Acute on chronic respiratory failure with hypoxia we will continue with capping trials right now is on room air.  Titrate oxygen as tolerated continue pulmonary toilet. End-stage renal disease on hemodialysis we will continue to follow along. Lobar pneumonia treated no change we will continue to follow Chronic heart failure diastolic dysfunction supportive care Tracheostomy will be removed soon once we have a better idea of discharge planning   I have personally seen and evaluated the patient, evaluated laboratory and imaging results, formulated the assessment and plan and placed orders. The Patient requires high complexity decision making with multiple systems involvement.  Rounds were done with the Respiratory Therapy Director and Staff therapists and discussed with nursing staff also.  Allyne Gee, MD [facility] Pulmonary Critical Care Medicine Sleep Medicine

## 2020-08-25 DIAGNOSIS — N186 End stage renal disease: Secondary | ICD-10-CM | POA: Diagnosis not present

## 2020-08-25 DIAGNOSIS — J181 Lobar pneumonia, unspecified organism: Secondary | ICD-10-CM | POA: Diagnosis not present

## 2020-08-25 DIAGNOSIS — I5032 Chronic diastolic (congestive) heart failure: Secondary | ICD-10-CM | POA: Diagnosis not present

## 2020-08-25 DIAGNOSIS — J9621 Acute and chronic respiratory failure with hypoxia: Secondary | ICD-10-CM | POA: Diagnosis not present

## 2020-08-25 NOTE — Progress Notes (Signed)
Pulmonary [place]  PROGRESS NOTE     Jeffery Andrews  [id number]  DOB: 1975/07/04   DOA: 07/17/2020  Referring Physician: Satira Sark, MD  HPI: Jeffery Andrews is a 45 y.o. male being followed for ventilator/airway/oxygen weaning Acute on Chronic Respiratory Failure.  Patient is comfortable right now without distress at this time has been capping on room air secretions are noticed to be slightly increased  Medications: Reviewed on Rounds  Physical Exam:  Vitals: Temperature is 98.2 pulse 71 respiratory 15 blood pressure is 137/84 saturations 100%  Ventilator Settings capping of the ventilator on room air  General: Comfortable at this time Neck: supple Cardiovascular: no malignant arrhythmias Respiratory: No rhonchi very coarse breath sounds Skin: no rash seen on limited exam Musculoskeletal: No gross abnormality Psychiatric:unable to assess Neurologic:no involuntary movements         Lab Data:   Basic Metabolic Panel: Recent Labs  Lab 08/19/20 0539 08/21/20 0433 08/24/20 0938  NA 128* 130* 130*  K 4.5 3.8 4.4  CL 90* 91* 91*  CO2 '27 29 29  '$ GLUCOSE 209* 145* 139*  BUN 102* 81* 110*  CREATININE 4.00* 3.83* 5.13*  CALCIUM 9.5 9.6 9.8  PHOS 2.0* 2.6 2.4*    ABG: No results for input(s): PHART, PCO2ART, PO2ART, HCO3, O2SAT in the last 168 hours.  Liver Function Tests: Recent Labs  Lab 08/19/20 0539 08/21/20 0433 08/24/20 0938  ALBUMIN 2.1* 2.0* 2.1*   No results for input(s): LIPASE, AMYLASE in the last 168 hours. No results for input(s): AMMONIA in the last 168 hours.  CBC: Recent Labs  Lab 08/19/20 0539 08/21/20 0433 08/24/20 0937  WBC 9.4 8.5 9.2  HGB 7.3* 7.1* 7.5*  HCT 23.8* 23.1* 23.6*  MCV 83.2 82.8 81.1  PLT 269 264 258    Cardiac Enzymes: No results for input(s): CKTOTAL, CKMB, CKMBINDEX, TROPONINI in the last 168 hours.  BNP (last 3  results) No results for input(s): BNP in the last 8760 hours.  ProBNP (last 3 results) No results for input(s): PROBNP in the last 8760 hours.  Radiological Exams: No results found.  Assessment/Plan Active Problems:   Acute on chronic respiratory failure with hypoxia (HCC)   End stage renal disease on dialysis (HCC)   Lobar pneumonia, unspecified organism (HCC)   Chronic diastolic CHF (congestive heart failure) (HCC)   Acute on chronic respiratory failure with hypoxia plan is to continue with capping as noted secretions are slightly increased however no major color changes are noted.  Patient still has a strong cough End-stage renal failure on hemodialysis we will continue to follow along closely Lobar pneumonia treated continue with supportive care Chronic diastolic heart failure compensated monitor fluid status    I have personally seen and evaluated the patient, evaluated laboratory and imaging results, formulated the assessment and plan and placed orders. The Patient requires high complexity decision making with multiple systems involvement.  Rounds were done with the Respiratory Therapy Director and Staff therapists and discussed with nursing staff also.  Allyne Gee, MD [facility] Pulmonary Critical Care Medicine Sleep Medicine

## 2020-08-26 DIAGNOSIS — I5032 Chronic diastolic (congestive) heart failure: Secondary | ICD-10-CM | POA: Diagnosis not present

## 2020-08-26 DIAGNOSIS — J9621 Acute and chronic respiratory failure with hypoxia: Secondary | ICD-10-CM | POA: Diagnosis not present

## 2020-08-26 DIAGNOSIS — N186 End stage renal disease: Secondary | ICD-10-CM | POA: Diagnosis not present

## 2020-08-26 DIAGNOSIS — J181 Lobar pneumonia, unspecified organism: Secondary | ICD-10-CM | POA: Diagnosis not present

## 2020-08-26 LAB — RENAL FUNCTION PANEL
Albumin: 2.1 g/dL — ABNORMAL LOW (ref 3.5–5.0)
Anion gap: 12 (ref 5–15)
BUN: 105 mg/dL — ABNORMAL HIGH (ref 6–20)
CO2: 26 mmol/L (ref 22–32)
Calcium: 9.9 mg/dL (ref 8.9–10.3)
Chloride: 92 mmol/L — ABNORMAL LOW (ref 98–111)
Creatinine, Ser: 4.93 mg/dL — ABNORMAL HIGH (ref 0.61–1.24)
GFR, Estimated: 14 mL/min — ABNORMAL LOW (ref >60)
Glucose, Bld: 197 mg/dL — ABNORMAL HIGH (ref 70–99)
Phosphorus: 2.1 mg/dL — ABNORMAL LOW (ref 2.5–4.6)
Potassium: 4.5 mmol/L (ref 3.5–5.1)
Sodium: 130 mmol/L — ABNORMAL LOW (ref 135–145)

## 2020-08-26 LAB — CBC
HCT: 23.7 % — ABNORMAL LOW (ref 39.0–52.0)
Hemoglobin: 7.6 g/dL — ABNORMAL LOW (ref 13.0–17.0)
MCH: 25.8 pg — ABNORMAL LOW (ref 26.0–34.0)
MCHC: 32.1 g/dL (ref 30.0–36.0)
MCV: 80.3 fL (ref 80.0–100.0)
Platelets: 245 10*3/uL (ref 150–400)
RBC: 2.95 MIL/uL — ABNORMAL LOW (ref 4.22–5.81)
RDW: 18.7 % — ABNORMAL HIGH (ref 11.5–15.5)
WBC: 8.8 10*3/uL (ref 4.0–10.5)
nRBC: 0 % (ref 0.0–0.2)

## 2020-08-26 NOTE — Progress Notes (Signed)
Pulmonary [place]  PROGRESS NOTE     ZEKE PIRILLO  [id number]  DOB: 11-21-1975   DOA: 07/17/2020  Referring Physician: Satira Sark, MD  HPI: AMOD LIQUORI is a 45 y.o. male being followed for ventilator/airway/oxygen weaning Acute on Chronic Respiratory Failure.  Patient is comfortable right now without distress no fevers are noted patient has been doing fine with capping  Medications: Reviewed on Rounds  Physical Exam:  Vitals: Temperature 97.7 pulse of 58 respiratory rate 14 blood pressure 162/90 saturations 100%  Ventilator Settings capping off the ventilator  General: Comfortable at this time Neck: supple Cardiovascular: no malignant arrhythmias Respiratory: No rhonchi no rales are noted at this time Skin: no rash seen on limited exam Musculoskeletal: No gross abnormality Psychiatric:unable to assess Neurologic:no involuntary movements         Lab Data:   Basic Metabolic Panel: Recent Labs  Lab 08/21/20 0433 08/24/20 0938 08/26/20 0508  NA 130* 130* 130*  K 3.8 4.4 4.5  CL 91* 91* 92*  CO2 '29 29 26  '$ GLUCOSE 145* 139* 197*  BUN 81* 110* 105*  CREATININE 3.83* 5.13* 4.93*  CALCIUM 9.6 9.8 9.9  PHOS 2.6 2.4* 2.1*    ABG: No results for input(s): PHART, PCO2ART, PO2ART, HCO3, O2SAT in the last 168 hours.  Liver Function Tests: Recent Labs  Lab 08/21/20 0433 08/24/20 0938 08/26/20 0508  ALBUMIN 2.0* 2.1* 2.1*   No results for input(s): LIPASE, AMYLASE in the last 168 hours. No results for input(s): AMMONIA in the last 168 hours.  CBC: Recent Labs  Lab 08/21/20 0433 08/24/20 0937 08/26/20 0508  WBC 8.5 9.2 8.8  HGB 7.1* 7.5* 7.6*  HCT 23.1* 23.6* 23.7*  MCV 82.8 81.1 80.3  PLT 264 258 245    Cardiac Enzymes: No results for input(s): CKTOTAL, CKMB, CKMBINDEX, TROPONINI in the last 168 hours.  BNP (last 3 results) No results for input(s):  BNP in the last 8760 hours.  ProBNP (last 3 results) No results for input(s): PROBNP in the last 8760 hours.  Radiological Exams: No results found.  Assessment/Plan Active Problems:   Acute on chronic respiratory failure with hypoxia (HCC)   End stage renal disease on dialysis (HCC)   Lobar pneumonia, unspecified organism (HCC)   Chronic diastolic CHF (congestive heart failure) (HCC)   Acute on chronic respiratory failure with hypoxia continues to do well with capping and we will continue with the capping trials. End-stage renal failure on hemodialysis continue with supportive care Lobar pneumonia treated slow improvement Chronic diastolic heart failure compensated at this stage Encephalopathy is at baseline do not expect any further improvement   I have personally seen and evaluated the patient, evaluated laboratory and imaging results, formulated the assessment and plan and placed orders. The Patient requires high complexity decision making with multiple systems involvement.  Rounds were done with the Respiratory Therapy Director and Staff therapists and discussed with nursing staff also.  Allyne Gee, MD [facility] Pulmonary Critical Care Medicine Sleep Medicine

## 2020-08-27 DIAGNOSIS — N186 End stage renal disease: Secondary | ICD-10-CM | POA: Diagnosis not present

## 2020-08-27 DIAGNOSIS — J181 Lobar pneumonia, unspecified organism: Secondary | ICD-10-CM | POA: Diagnosis not present

## 2020-08-27 DIAGNOSIS — J9621 Acute and chronic respiratory failure with hypoxia: Secondary | ICD-10-CM | POA: Diagnosis not present

## 2020-08-27 DIAGNOSIS — I5032 Chronic diastolic (congestive) heart failure: Secondary | ICD-10-CM | POA: Diagnosis not present

## 2020-08-27 NOTE — Progress Notes (Signed)
Pulmonary [place]  PROGRESS NOTE     Jeffery Andrews  [id number]  DOB: Sep 09, 1975   DOA: 07/17/2020  Referring Physician: Satira Sark, MD  HPI: Jeffery Andrews is a 45 y.o. male being followed for ventilator/airway/oxygen weaning Acute on Chronic Respiratory Failure.  Patient is capping currently without any distress has been doing fairly well  Medications: Reviewed on Rounds  Physical Exam:  Vitals: Temperature is 97.7 pulse 62 respiratory rate is 19 blood pressure 147/82 saturations 100%  Ventilator Settings capping currently off of the ventilator  General: Comfortable at this time Neck: supple Cardiovascular: no malignant arrhythmias Respiratory: No rhonchi no rales Skin: no rash seen on limited exam Musculoskeletal: No gross abnormality Psychiatric:unable to assess Neurologic:no involuntary movements         Lab Data:   Basic Metabolic Panel: Recent Labs  Lab 08/21/20 0433 08/24/20 0938 08/26/20 0508  NA 130* 130* 130*  K 3.8 4.4 4.5  CL 91* 91* 92*  CO2 '29 29 26  '$ GLUCOSE 145* 139* 197*  BUN 81* 110* 105*  CREATININE 3.83* 5.13* 4.93*  CALCIUM 9.6 9.8 9.9  PHOS 2.6 2.4* 2.1*    ABG: No results for input(s): PHART, PCO2ART, PO2ART, HCO3, O2SAT in the last 168 hours.  Liver Function Tests: Recent Labs  Lab 08/21/20 0433 08/24/20 0938 08/26/20 0508  ALBUMIN 2.0* 2.1* 2.1*   No results for input(s): LIPASE, AMYLASE in the last 168 hours. No results for input(s): AMMONIA in the last 168 hours.  CBC: Recent Labs  Lab 08/21/20 0433 08/24/20 0937 08/26/20 0508  WBC 8.5 9.2 8.8  HGB 7.1* 7.5* 7.6*  HCT 23.1* 23.6* 23.7*  MCV 82.8 81.1 80.3  PLT 264 258 245    Cardiac Enzymes: No results for input(s): CKTOTAL, CKMB, CKMBINDEX, TROPONINI in the last 168 hours.  BNP (last 3 results) No results for input(s): BNP in the last 8760 hours.  ProBNP  (last 3 results) No results for input(s): PROBNP in the last 8760 hours.  Radiological Exams: No results found.  Assessment/Plan Active Problems:   Acute on chronic respiratory failure with hypoxia (HCC)   End stage renal disease on dialysis (HCC)   Lobar pneumonia, unspecified organism (HCC)   Chronic diastolic CHF (congestive heart failure) (HCC)   Acute on chronic respiratory failure with hypoxia plan is going to be to continue with capping as ordered titrate oxygen as tolerated continue pulmonary toilet. End-stage renal failure patient's been on hemodialysis and will continue to require dialysis Lobar pneumonia treated clinically improved Chronic diastolic heart failure compensated Tracheostomy will remain in place   I have personally seen and evaluated the patient, evaluated laboratory and imaging results, formulated the assessment and plan and placed orders. The Patient requires high complexity decision making with multiple systems involvement.  Rounds were done with the Respiratory Therapy Director and Staff therapists and discussed with nursing staff also.  Allyne Gee, MD [facility] Pulmonary Critical Care Medicine Sleep Medicine

## 2020-08-27 NOTE — Progress Notes (Signed)
[facility]  ROUNDING NOTE   Subjective:  Patient continues to progress well with MWF dialysis treatments. He underwent dialysis treatment yesterday. No significant change in mental status.  Objective:  Vital signs in last 24 hours:  Temperature 97.7 pulse 62 respirations 9 blood pressure 147/82  Physical Exam: General:  No acute distress  Head:  Normocephalic, atraumatic.    Eyes:  Anicteric  Neck:  Tracheostomy in place  Lungs:   Clear to auscultation, normal effort  Heart:  S1S2 no rubs  Abdomen:   Soft, nontender, bowel sounds present  Extremities:  Trace peripheral edema.  Neurologic:  Neck deviation to the left, contractures in both hands  Skin:  No acute skin rash  Access:  Left upper extremity AV fistula    Basic Metabolic Panel: Recent Labs  Lab 08/21/20 0433 08/24/20 0938 08/26/20 0508  NA 130* 130* 130*  K 3.8 4.4 4.5  CL 91* 91* 92*  CO2 '29 29 26  '$ GLUCOSE 145* 139* 197*  BUN 81* 110* 105*  CREATININE 3.83* 5.13* 4.93*  CALCIUM 9.6 9.8 9.9  PHOS 2.6 2.4* 2.1*     Liver Function Tests: Recent Labs  Lab 08/21/20 0433 08/24/20 0938 08/26/20 0508  ALBUMIN 2.0* 2.1* 2.1*    No results for input(s): LIPASE, AMYLASE in the last 168 hours. No results for input(s): AMMONIA in the last 168 hours.  CBC: Recent Labs  Lab 08/21/20 0433 08/24/20 0937 08/26/20 0508  WBC 8.5 9.2 8.8  HGB 7.1* 7.5* 7.6*  HCT 23.1* 23.6* 23.7*  MCV 82.8 81.1 80.3  PLT 264 258 245     Cardiac Enzymes: No results for input(s): CKTOTAL, CKMB, CKMBINDEX, TROPONINI in the last 168 hours.  BNP: Invalid input(s): POCBNP  CBG: No results for input(s): GLUCAP in the last 168 hours.  Microbiology: Results for orders placed or performed during the hospital encounter of 07/17/20  Culture, blood (routine x 2)     Status: None   Collection Time: 07/27/20 11:17 AM   Specimen: BLOOD RIGHT ARM  Result Value Ref Range Status   Specimen Description BLOOD RIGHT  ARM  Final   Special Requests   Final    BOTTLES DRAWN AEROBIC ONLY Blood Culture results may not be optimal due to an inadequate volume of blood received in culture bottles   Culture   Final    NO GROWTH 5 DAYS Performed at [facility], [place] [address] [place], [place] [address]    Report Status 08/01/2020 FINAL  Final  Culture, blood (routine x 2)     Status: None   Collection Time: 07/27/20 11:23 AM   Specimen: BLOOD RIGHT HAND  Result Value Ref Range Status   Specimen Description BLOOD RIGHT HAND  Final   Special Requests   Final    BOTTLES DRAWN AEROBIC ONLY Blood Culture results may not be optimal due to an inadequate volume of blood received in culture bottles   Culture   Final    NO GROWTH 5 DAYS Performed at [facility], [place] [address], [place], [place] [address]    Report Status 08/01/2020 FINAL  Final  Culture, blood (routine x 2)     Status: None   Collection Time: 08/01/20  2:25 PM   Specimen: BLOOD RIGHT HAND  Result Value Ref Range Status   Specimen Description BLOOD RIGHT HAND  Final   Special Requests   Final    BOTTLES DRAWN AEROBIC AND ANAEROBIC Blood Culture results may  not be optimal due to an inadequate volume of blood received in culture bottles   Culture   Final    NO GROWTH 5 DAYS Performed at [facility], [place] [address], [place], [place] [address]    Report Status 08/06/2020 FINAL  Final  Culture, blood (routine x 2)     Status: None   Collection Time: 08/01/20  2:39 PM   Specimen: BLOOD RIGHT ARM  Result Value Ref Range Status   Specimen Description BLOOD RIGHT ARM  Final   Special Requests   Final    BOTTLES DRAWN AEROBIC ONLY Blood Culture results may not be optimal due to an inadequate volume of blood received in culture bottles   Culture   Final    NO GROWTH 5 DAYS Performed at [facility], [place] [address], [place], [place] [address]    Report Status 08/06/2020 FINAL  Final  Culture, Respiratory w Gram Stain      Status: None   Collection Time: 08/01/20  2:55 PM   Specimen: Tracheal Aspirate; Respiratory  Result Value Ref Range Status   Specimen Description TRACHEAL ASPIRATE  Final   Special Requests NONE  Final   Gram Stain   Final    FEW WBC PRESENT, PREDOMINANTLY PMN MODERATE GRAM NEGATIVE RODS Performed at [facility], [place] [address], [place], [place] [address]    Culture ABUNDANT PSEUDOMONAS AERUGINOSA  Final   Report Status 08/04/2020 FINAL  Final   Organism ID, Bacteria PSEUDOMONAS AERUGINOSA  Final      Susceptibility   Pseudomonas aeruginosa - MIC*    CEFTAZIDIME <=1 SENSITIVE Sensitive     CIPROFLOXACIN <=0.25 SENSITIVE Sensitive     GENTAMICIN 8 INTERMEDIATE Intermediate     IMIPENEM >=16 RESISTANT Resistant     PIP/TAZO <=4 SENSITIVE Sensitive     CEFEPIME 2 SENSITIVE Sensitive     * ABUNDANT PSEUDOMONAS AERUGINOSA    Coagulation Studies: No results for input(s): LABPROT, INR in the last 72 hours.   Urinalysis: No results for input(s): COLORURINE, LABSPEC, PHURINE, GLUCOSEU, HGBUR, BILIRUBINUR, KETONESUR, PROTEINUR, UROBILINOGEN, NITRITE, LEUKOCYTESUR in the last 72 hours.  Invalid input(s): APPERANCEUR    Imaging: No results found.   Medications:       Assessment/ Plan:  45 y.o. male with a PMHx of ESRD on HD, posterior reversible encephalopathy syndrome, diabetes mellitus type 2, chronic systolic heart failure, anemia of chronic kidney disease, secondary hyperparathyroidism, malignant hypertension, acute respiratory failure, who was admitted to [facility] on 06/29/2020 for ongoing treatment of acute respiratory failure, posterior reversible encephalopathy syndrome, and end-stage renal disease.   1.  ESRD on HD.  We will plan for hemodialysis treatment tomorrow.  Orders have been prepared.  2.  Anemia of chronic kidney disease.  Hemoglobin slightly higher to 7.6.  Maintain the patient on Retacrit.  3.  Secondary hyperparathyroidism.  Phosphorus 2.1  at last check.  We plan to repeat serum phosphorus tomorrow.  4.  Acute respiratory failure.  Tracheostomy placed 07/17/2020.  Patient continues to tolerate tracheostomy being capped.  5.  Hypertension.  Continue amlodipine, hydralazine, clonidine, labetalol, and losartan.  Blood pressure currently 147/82.    LOS: 0 Medha Pippen 8/25/20225:45 PM

## 2020-08-28 DIAGNOSIS — I5032 Chronic diastolic (congestive) heart failure: Secondary | ICD-10-CM | POA: Diagnosis not present

## 2020-08-28 DIAGNOSIS — J9621 Acute and chronic respiratory failure with hypoxia: Secondary | ICD-10-CM | POA: Diagnosis not present

## 2020-08-28 DIAGNOSIS — N186 End stage renal disease: Secondary | ICD-10-CM | POA: Diagnosis not present

## 2020-08-28 DIAGNOSIS — J181 Lobar pneumonia, unspecified organism: Secondary | ICD-10-CM | POA: Diagnosis not present

## 2020-08-28 LAB — RENAL FUNCTION PANEL
Albumin: 2.1 g/dL — ABNORMAL LOW (ref 3.5–5.0)
Anion gap: 13 (ref 5–15)
BUN: 67 mg/dL — ABNORMAL HIGH (ref 6–20)
CO2: 28 mmol/L (ref 22–32)
Calcium: 9.9 mg/dL (ref 8.9–10.3)
Chloride: 91 mmol/L — ABNORMAL LOW (ref 98–111)
Creatinine, Ser: 4.24 mg/dL — ABNORMAL HIGH (ref 0.61–1.24)
GFR, Estimated: 17 mL/min — ABNORMAL LOW (ref >60)
Glucose, Bld: 178 mg/dL — ABNORMAL HIGH (ref 70–99)
Phosphorus: 2.1 mg/dL — ABNORMAL LOW (ref 2.5–4.6)
Potassium: 4 mmol/L (ref 3.5–5.1)
Sodium: 132 mmol/L — ABNORMAL LOW (ref 135–145)

## 2020-08-28 LAB — CBC
HCT: 23 % — ABNORMAL LOW (ref 39.0–52.0)
Hemoglobin: 7 g/dL — ABNORMAL LOW (ref 13.0–17.0)
MCH: 24.6 pg — ABNORMAL LOW (ref 26.0–34.0)
MCHC: 30.4 g/dL (ref 30.0–36.0)
MCV: 81 fL (ref 80.0–100.0)
Platelets: 266 10*3/uL (ref 150–400)
RBC: 2.84 MIL/uL — ABNORMAL LOW (ref 4.22–5.81)
RDW: 18.6 % — ABNORMAL HIGH (ref 11.5–15.5)
WBC: 7.9 10*3/uL (ref 4.0–10.5)
nRBC: 0 % (ref 0.0–0.2)

## 2020-08-28 NOTE — Progress Notes (Signed)
Pulmonary [place]  PROGRESS NOTE     Jeffery Andrews  [id number]  DOB: May 12, 1975   DOA: 07/17/2020  Referring Physician: Satira Sark, MD  HPI: Jeffery Andrews is a 45 y.o. male being followed for ventilator/airway/oxygen weaning Acute on Chronic Respiratory Failure.  Patient is at baseline comfortable right now without distress afebrile  Medications: Reviewed on Rounds  Physical Exam:  Vitals: Temperature is 97.0 pulse 65 respiratory rate 16 blood pressure is 166/43 saturations 100%  Ventilator Settings patient is capping off the ventilator  General: Comfortable at this time Neck: supple Cardiovascular: no malignant arrhythmias Respiratory: No rhonchi very coarse breath sounds Skin: no rash seen on limited exam Musculoskeletal: No gross abnormality Psychiatric:unable to assess Neurologic:no involuntary movements         Lab Data:   Basic Metabolic Panel: Recent Labs  Lab 08/24/20 0938 08/26/20 0508 08/28/20 0435  NA 130* 130* 132*  K 4.4 4.5 4.0  CL 91* 92* 91*  CO2 '29 26 28  '$ GLUCOSE 139* 197* 178*  BUN 110* 105* 67*  CREATININE 5.13* 4.93* 4.24*  CALCIUM 9.8 9.9 9.9  PHOS 2.4* 2.1* 2.1*    ABG: No results for input(s): PHART, PCO2ART, PO2ART, HCO3, O2SAT in the last 168 hours.  Liver Function Tests: Recent Labs  Lab 08/24/20 0938 08/26/20 0508 08/28/20 0435  ALBUMIN 2.1* 2.1* 2.1*   No results for input(s): LIPASE, AMYLASE in the last 168 hours. No results for input(s): AMMONIA in the last 168 hours.  CBC: Recent Labs  Lab 08/24/20 0937 08/26/20 0508 08/28/20 0435  WBC 9.2 8.8 7.9  HGB 7.5* 7.6* 7.0*  HCT 23.6* 23.7* 23.0*  MCV 81.1 80.3 81.0  PLT 258 245 266    Cardiac Enzymes: No results for input(s): CKTOTAL, CKMB, CKMBINDEX, TROPONINI in the last 168 hours.  BNP (last 3 results) No results for input(s): BNP in the last 8760  hours.  ProBNP (last 3 results) No results for input(s): PROBNP in the last 8760 hours.  Radiological Exams: No results found.  Assessment/Plan Active Problems:   Acute on chronic respiratory failure with hypoxia (HCC)   End stage renal disease on dialysis (HCC)   Lobar pneumonia, unspecified organism (HCC)   Chronic diastolic CHF (congestive heart failure) (HCC)   Acute on chronic respiratory failure with hypoxia patient continues to do fine with capping we will continue to follow along closely.  Patient is still on hemodialysis End-stage renal disease on hemodialysis followed by nephrology Lobar pneumonia treated clinically is improving Chronic diastolic heart failure compensated monitor fluid status Tracheostomy remains in place   I have personally seen and evaluated the patient, evaluated laboratory and imaging results, formulated the assessment and plan and placed orders. The Patient requires high complexity decision making with multiple systems involvement.  Rounds were done with the Respiratory Therapy Director and Staff therapists and discussed with nursing staff also.  Allyne Gee, MD [facility] Pulmonary Critical Care Medicine Sleep Medicine

## 2020-08-28 NOTE — Progress Notes (Signed)
[facility]  ROUNDING NOTE   Subjective:  Patient seen and evaluated at bedside. Just vomited and appears to have aspirated some of it as he is coughing. Due for dialysis today.  Objective:  Vital signs in last 24 hours:  Temperature 97 pulse 65 respiration 16 blood pressure 166/93  Physical Exam: General:  No acute distress  Head:  Normocephalic, atraumatic.    Eyes:  Anicteric  Neck:  Tracheostomy in place  Lungs:   Clear to auscultation, normal effort  Heart:  S1S2 no rubs  Abdomen:   Soft, nontender, bowel sounds present  Extremities:  Trace peripheral edema.  Neurologic:  Neck deviation to the left, contractures in both hands  Skin:  No acute skin rash  Access:  Left upper extremity AV fistula    Basic Metabolic Panel: Recent Labs  Lab 08/24/20 0938 08/26/20 0508 08/28/20 0435  NA 130* 130* 132*  K 4.4 4.5 4.0  CL 91* 92* 91*  CO2 '29 26 28  '$ GLUCOSE 139* 197* 178*  BUN 110* 105* 67*  CREATININE 5.13* 4.93* 4.24*  CALCIUM 9.8 9.9 9.9  PHOS 2.4* 2.1* 2.1*     Liver Function Tests: Recent Labs  Lab 08/24/20 0938 08/26/20 0508 08/28/20 0435  ALBUMIN 2.1* 2.1* 2.1*    No results for input(s): LIPASE, AMYLASE in the last 168 hours. No results for input(s): AMMONIA in the last 168 hours.  CBC: Recent Labs  Lab 08/24/20 0937 08/26/20 0508 08/28/20 0435  WBC 9.2 8.8 7.9  HGB 7.5* 7.6* 7.0*  HCT 23.6* 23.7* 23.0*  MCV 81.1 80.3 81.0  PLT 258 245 266     Cardiac Enzymes: No results for input(s): CKTOTAL, CKMB, CKMBINDEX, TROPONINI in the last 168 hours.  BNP: Invalid input(s): POCBNP  CBG: No results for input(s): GLUCAP in the last 168 hours.  Microbiology: Results for orders placed or performed during the hospital encounter of 07/17/20  Culture, blood (routine x 2)     Status: None   Collection Time: 07/27/20 11:17 AM   Specimen: BLOOD RIGHT ARM  Result Value Ref Range Status   Specimen Description BLOOD RIGHT ARM  Final    Special Requests   Final    BOTTLES DRAWN AEROBIC ONLY Blood Culture results may not be optimal due to an inadequate volume of blood received in culture bottles   Culture   Final    NO GROWTH 5 DAYS Performed at [facility], [place] [address] [place], [place] [address]    Report Status 08/01/2020 FINAL  Final  Culture, blood (routine x 2)     Status: None   Collection Time: 07/27/20 11:23 AM   Specimen: BLOOD RIGHT HAND  Result Value Ref Range Status   Specimen Description BLOOD RIGHT HAND  Final   Special Requests   Final    BOTTLES DRAWN AEROBIC ONLY Blood Culture results may not be optimal due to an inadequate volume of blood received in culture bottles   Culture   Final    NO GROWTH 5 DAYS Performed at [facility], [place] [address] [place], [place] [address]    Report Status 08/01/2020 FINAL  Final  Culture, blood (routine x 2)     Status: None   Collection Time: 08/01/20  2:25 PM   Specimen: BLOOD RIGHT HAND  Result Value Ref Range Status   Specimen Description BLOOD RIGHT HAND  Final   Special Requests   Final    BOTTLES DRAWN AEROBIC AND ANAEROBIC  Blood Culture results may not be optimal due to an inadequate volume of blood received in culture bottles   Culture   Final    NO GROWTH 5 DAYS Performed at [facility] [address], [place], [place] [address]    Report Status 08/06/2020 FINAL  Final  Culture, blood (routine x 2)     Status: None   Collection Time: 08/01/20  2:39 PM   Specimen: BLOOD RIGHT ARM  Result Value Ref Range Status   Specimen Description BLOOD RIGHT ARM  Final   Special Requests   Final    BOTTLES DRAWN AEROBIC ONLY Blood Culture results may not be optimal due to an inadequate volume of blood received in culture bottles   Culture   Final    NO GROWTH 5 DAYS Performed at [facility], [place] [address] [place], [place] [address]    Report Status 08/06/2020 FINAL  Final  Culture, Respiratory w Gram Stain     Status: None    Collection Time: 08/01/20  2:55 PM   Specimen: Tracheal Aspirate; Respiratory  Result Value Ref Range Status   Specimen Description TRACHEAL ASPIRATE  Final   Special Requests NONE  Final   Gram Stain   Final    FEW WBC PRESENT, PREDOMINANTLY PMN MODERATE GRAM NEGATIVE RODS Performed at [facility], [place] [address], [place], [place] [address]    Culture ABUNDANT PSEUDOMONAS AERUGINOSA  Final   Report Status 08/04/2020 FINAL  Final   Organism ID, Bacteria PSEUDOMONAS AERUGINOSA  Final      Susceptibility   Pseudomonas aeruginosa - MIC*    CEFTAZIDIME <=1 SENSITIVE Sensitive     CIPROFLOXACIN <=0.25 SENSITIVE Sensitive     GENTAMICIN 8 INTERMEDIATE Intermediate     IMIPENEM >=16 RESISTANT Resistant     PIP/TAZO <=4 SENSITIVE Sensitive     CEFEPIME 2 SENSITIVE Sensitive     * ABUNDANT PSEUDOMONAS AERUGINOSA    Coagulation Studies: No results for input(s): LABPROT, INR in the last 72 hours.   Urinalysis: No results for input(s): COLORURINE, LABSPEC, PHURINE, GLUCOSEU, HGBUR, BILIRUBINUR, KETONESUR, PROTEINUR, UROBILINOGEN, NITRITE, LEUKOCYTESUR in the last 72 hours.  Invalid input(s): APPERANCEUR    Imaging: No results found.   Medications:       Assessment/ Plan:  45 y.o. male with a PMHx of ESRD on HD, posterior reversible encephalopathy syndrome, diabetes mellitus type 2, chronic systolic heart failure, anemia of chronic kidney disease, secondary hyperparathyroidism, malignant hypertension, acute respiratory failure, who was admitted to [facility] on 06/29/2020 for ongoing treatment of acute respiratory failure, posterior reversible encephalopathy syndrome, and end-stage renal disease.   1.  ESRD on HD.  Patient due for dialysis later this afternoon.  Continue MWF schedule.  2.  Anemia of chronic kidney disease.  Hemoglobin down to 7.0.  Consider blood transfusion but defer to primary team.  3.  Secondary hyperparathyroidism.  Continue to monitor bone  mineral metabolism parameters.  4.  Acute respiratory failure.  Tracheostomy placed 07/17/2020.  Patient vomited this a.m. and began coughing subsequently.  Suspect  5.  Hypertension.  Continue amlodipine, hydralazine, clonidine, labetalol, and losartan.  Systolic blood pressure ranges between 140s to 160s.    LOS: 0 Sokhna Christoph 8/26/202210:56 AM

## 2020-08-30 ENCOUNTER — Other Ambulatory Visit (HOSPITAL_COMMUNITY): Payer: Medicare Other

## 2020-08-31 ENCOUNTER — Other Ambulatory Visit (HOSPITAL_COMMUNITY): Payer: Medicare Other

## 2020-08-31 DIAGNOSIS — J9621 Acute and chronic respiratory failure with hypoxia: Secondary | ICD-10-CM | POA: Diagnosis not present

## 2020-08-31 DIAGNOSIS — I5032 Chronic diastolic (congestive) heart failure: Secondary | ICD-10-CM | POA: Diagnosis not present

## 2020-08-31 DIAGNOSIS — N186 End stage renal disease: Secondary | ICD-10-CM | POA: Diagnosis not present

## 2020-08-31 DIAGNOSIS — J181 Lobar pneumonia, unspecified organism: Secondary | ICD-10-CM | POA: Diagnosis not present

## 2020-08-31 LAB — RENAL FUNCTION PANEL
Albumin: 2 g/dL — ABNORMAL LOW (ref 3.5–5.0)
Anion gap: 14 (ref 5–15)
BUN: 68 mg/dL — ABNORMAL HIGH (ref 6–20)
CO2: 26 mmol/L (ref 22–32)
Calcium: 9.5 mg/dL (ref 8.9–10.3)
Chloride: 87 mmol/L — ABNORMAL LOW (ref 98–111)
Creatinine, Ser: 5.06 mg/dL — ABNORMAL HIGH (ref 0.61–1.24)
GFR, Estimated: 13 mL/min — ABNORMAL LOW (ref >60)
Glucose, Bld: 320 mg/dL — ABNORMAL HIGH (ref 70–99)
Phosphorus: 3.1 mg/dL (ref 2.5–4.6)
Potassium: 4.2 mmol/L (ref 3.5–5.1)
Sodium: 127 mmol/L — ABNORMAL LOW (ref 135–145)

## 2020-08-31 LAB — CBC
HCT: 22.7 % — ABNORMAL LOW (ref 39.0–52.0)
Hemoglobin: 7.1 g/dL — ABNORMAL LOW (ref 13.0–17.0)
MCH: 25.2 pg — ABNORMAL LOW (ref 26.0–34.0)
MCHC: 31.3 g/dL (ref 30.0–36.0)
MCV: 80.5 fL (ref 80.0–100.0)
Platelets: 294 10*3/uL (ref 150–400)
RBC: 2.82 MIL/uL — ABNORMAL LOW (ref 4.22–5.81)
RDW: 18.9 % — ABNORMAL HIGH (ref 11.5–15.5)
WBC: 18.1 10*3/uL — ABNORMAL HIGH (ref 4.0–10.5)
nRBC: 0 % (ref 0.0–0.2)

## 2020-08-31 LAB — PREPARE RBC (CROSSMATCH)

## 2020-08-31 LAB — HEMOGLOBIN AND HEMATOCRIT, BLOOD
HCT: 26.8 % — ABNORMAL LOW (ref 39.0–52.0)
Hemoglobin: 8.1 g/dL — ABNORMAL LOW (ref 13.0–17.0)

## 2020-08-31 NOTE — Progress Notes (Signed)
Patient is unavailable for EEG due to in dialysis.  We will re-attempt when he is available.

## 2020-08-31 NOTE — Progress Notes (Signed)
Pulmonary [place]  PROGRESS NOTE     Jeffery Andrews  [id number]  DOB: Mar 20, 1975   DOA: 07/17/2020  Referring Physician: Satira Sark, MD  HPI: Jeffery Andrews is a 45 y.o. male being followed for ventilator/airway/oxygen weaning Acute on Chronic Respiratory Failure.  Patient is resting comfortably right now has been capping unfortunately last Friday patient had some issue with vomiting concern for aspiration.  I did review the chest x-ray and ordered a follow-up chest film  Medications: Reviewed on Rounds  Physical Exam:  Vitals: Temperature is 97.1 pulse 65 respiratory rate is 13 blood pressure 165/87 saturations 100%  Ventilator Settings currently is on capping trials doing fine but concern for aspiration  General: Comfortable at this time Neck: supple Cardiovascular: no malignant arrhythmias Respiratory: Coarse breath sounds are noted Skin: no rash seen on limited exam Musculoskeletal: No gross abnormality Psychiatric:unable to assess Neurologic:no involuntary movements         Lab Data:   Basic Metabolic Panel: Recent Labs  Lab 08/26/20 0508 08/28/20 0435 08/31/20 0349  NA 130* 132* 127*  K 4.5 4.0 4.2  CL 92* 91* 87*  CO2 '26 28 26  '$ GLUCOSE 197* 178* 320*  BUN 105* 67* 68*  CREATININE 4.93* 4.24* 5.06*  CALCIUM 9.9 9.9 9.5  PHOS 2.1* 2.1* 3.1    ABG: No results for input(s): PHART, PCO2ART, PO2ART, HCO3, O2SAT in the last 168 hours.  Liver Function Tests: Recent Labs  Lab 08/26/20 0508 08/28/20 0435 08/31/20 0349  ALBUMIN 2.1* 2.1* 2.0*   No results for input(s): LIPASE, AMYLASE in the last 168 hours. No results for input(s): AMMONIA in the last 168 hours.  CBC: Recent Labs  Lab 08/26/20 0508 08/28/20 0435 08/31/20 0349  WBC 8.8 7.9 18.1*  HGB 7.6* 7.0* 7.1*  HCT 23.7* 23.0* 22.7*  MCV 80.3 81.0 80.5  PLT 245 266 294    Cardiac  Enzymes: No results for input(s): CKTOTAL, CKMB, CKMBINDEX, TROPONINI in the last 168 hours.  BNP (last 3 results) No results for input(s): BNP in the last 8760 hours.  ProBNP (last 3 results) No results for input(s): PROBNP in the last 8760 hours.  Radiological Exams: DG Chest Port 1 View  Result Date: 08/30/2020 CLINICAL DATA:  Shortness of breath EXAM: PORTABLE CHEST 1 VIEW COMPARISON:  Chest x-ray 08/02/2020 FINDINGS: Tracheostomy tube in stable position. Similar-appearing enlarged cardiac silhouette. The heart and mediastinal contours are unchanged. No focal consolidation. Persistent increased interstitial markings. Increased patchy airspace opacities of the lung bases. Trace bilateral pleural effusion. No pneumothorax. No acute osseous abnormality. IMPRESSION: Cardiomegaly with pulmonary edema and bilateral trace pleural effusions. Superimposed infection/inflammation at the lung bases not excluded. Electronically Signed   By: Iven Finn M.D.   On: 08/30/2020 20:25    Assessment/Plan Active Problems:   Acute on chronic respiratory failure with hypoxia (HCC)   End stage renal disease on dialysis (HCC)   Lobar pneumonia, unspecified organism (HCC)   Chronic diastolic CHF (congestive heart failure) (HCC)   Acute on chronic respiratory failure hypoxia patient had vomiting event last week and has already noted concern for aspiration.  Plan is going to be to do a follow-up chest x-ray patient is also going to be started on antibiotics End-stage renal failure supportive care we will continue to monitor closely.  Patient is on hemodialysis per nephrology Lobar pneumonia has had previous pneumonia has been treated with antibiotics Chronic diastolic heart  failure compensated Tracheostomy remains in place is capping   I have personally seen and evaluated the patient, evaluated laboratory and imaging results, formulated the assessment and plan and placed orders. The Patient requires high  complexity decision making with multiple systems involvement.  Rounds were done with the Respiratory Therapy Director and Staff therapists and discussed with nursing staff also.  Allyne Gee, MD [facility] Pulmonary Critical Care Medicine Sleep Medicine

## 2020-08-31 NOTE — Progress Notes (Signed)
[facility]  ROUNDING NOTE   Subjective:  Patient sitting up in a chair this AM. He will be due for hemodialysis treatment today. Appears to be resting comfortably otherwise.  Objective:  Vital signs in last 24 hours:  Temperature 97.1 pulse 65 respirations 13 blood pressure 165/87  Physical Exam: General:  No acute distress  Head:  Normocephalic, atraumatic.    Eyes:  Anicteric  Neck:  Tracheostomy in place  Lungs:   Clear to auscultation, normal effort  Heart:  S1S2 no rubs  Abdomen:   Soft, nontender, bowel sounds present  Extremities:  Trace peripheral edema.  Neurologic:  Neck deviation to the left, contractures in both hands  Skin:  No acute skin rash  Access:  Left upper extremity AV fistula    Basic Metabolic Panel: Recent Labs  Lab 08/24/20 0938 08/26/20 0508 08/28/20 0435 08/31/20 0349  NA 130* 130* 132* 127*  K 4.4 4.5 4.0 4.2  CL 91* 92* 91* 87*  CO2 '29 26 28 26  '$ GLUCOSE 139* 197* 178* 320*  BUN 110* 105* 67* 68*  CREATININE 5.13* 4.93* 4.24* 5.06*  CALCIUM 9.8 9.9 9.9 9.5  PHOS 2.4* 2.1* 2.1* 3.1     Liver Function Tests: Recent Labs  Lab 08/24/20 0938 08/26/20 0508 08/28/20 0435 08/31/20 0349  ALBUMIN 2.1* 2.1* 2.1* 2.0*    No results for input(s): LIPASE, AMYLASE in the last 168 hours. No results for input(s): AMMONIA in the last 168 hours.  CBC: Recent Labs  Lab 08/24/20 0937 08/26/20 0508 08/28/20 0435 08/31/20 0349  WBC 9.2 8.8 7.9 18.1*  HGB 7.5* 7.6* 7.0* 7.1*  HCT 23.6* 23.7* 23.0* 22.7*  MCV 81.1 80.3 81.0 80.5  PLT 258 245 266 294     Cardiac Enzymes: No results for input(s): CKTOTAL, CKMB, CKMBINDEX, TROPONINI in the last 168 hours.  BNP: Invalid input(s): POCBNP  CBG: No results for input(s): GLUCAP in the last 168 hours.  Microbiology: Results for orders placed or performed during the hospital encounter of 07/17/20  Culture, blood (routine x 2)     Status: None   Collection Time: 07/27/20  11:17 AM   Specimen: BLOOD RIGHT ARM  Result Value Ref Range Status   Specimen Description BLOOD RIGHT ARM  Final   Special Requests   Final    BOTTLES DRAWN AEROBIC ONLY Blood Culture results may not be optimal due to an inadequate volume of blood received in culture bottles   Culture   Final    NO GROWTH 5 DAYS Performed at [facility], [place] [address], [place], [place] [address]    Report Status 08/01/2020 FINAL  Final  Culture, blood (routine x 2)     Status: None   Collection Time: 07/27/20 11:23 AM   Specimen: BLOOD RIGHT HAND  Result Value Ref Range Status   Specimen Description BLOOD RIGHT HAND  Final   Special Requests   Final    BOTTLES DRAWN AEROBIC ONLY Blood Culture results may not be optimal due to an inadequate volume of blood received in culture bottles   Culture   Final    NO GROWTH 5 DAYS Performed at [facility], [place] [address], [place], [place] [address]    Report Status 08/01/2020 FINAL  Final  Culture, blood (routine x 2)     Status: None   Collection Time: 08/01/20  2:25 PM   Specimen: BLOOD RIGHT HAND  Result Value Ref Range Status   Specimen Description BLOOD  RIGHT HAND  Final   Special Requests   Final    BOTTLES DRAWN AEROBIC AND ANAEROBIC Blood Culture results may not be optimal due to an inadequate volume of blood received in culture bottles   Culture   Final    NO GROWTH 5 DAYS Performed at [facility], [place] [address] [place], [place] [address]    Report Status 08/06/2020 FINAL  Final  Culture, blood (routine x 2)     Status: None   Collection Time: 08/01/20  2:39 PM   Specimen: BLOOD RIGHT ARM  Result Value Ref Range Status   Specimen Description BLOOD RIGHT ARM  Final   Special Requests   Final    BOTTLES DRAWN AEROBIC ONLY Blood Culture results may not be optimal due to an inadequate volume of blood received in culture bottles   Culture   Final    NO GROWTH 5 DAYS Performed at [facility], [place] [address]  [place], [place] [address]    Report Status 08/06/2020 FINAL  Final  Culture, Respiratory w Gram Stain     Status: None   Collection Time: 08/01/20  2:55 PM   Specimen: Tracheal Aspirate; Respiratory  Result Value Ref Range Status   Specimen Description TRACHEAL ASPIRATE  Final   Special Requests NONE  Final   Gram Stain   Final    FEW WBC PRESENT, PREDOMINANTLY PMN MODERATE GRAM NEGATIVE RODS Performed at [facility], [place] [address], [place], [place] [address]    Culture ABUNDANT PSEUDOMONAS AERUGINOSA  Final   Report Status 08/04/2020 FINAL  Final   Organism ID, Bacteria PSEUDOMONAS AERUGINOSA  Final      Susceptibility   Pseudomonas aeruginosa - MIC*    CEFTAZIDIME <=1 SENSITIVE Sensitive     CIPROFLOXACIN <=0.25 SENSITIVE Sensitive     GENTAMICIN 8 INTERMEDIATE Intermediate     IMIPENEM >=16 RESISTANT Resistant     PIP/TAZO <=4 SENSITIVE Sensitive     CEFEPIME 2 SENSITIVE Sensitive     * ABUNDANT PSEUDOMONAS AERUGINOSA    Coagulation Studies: No results for input(s): LABPROT, INR in the last 72 hours.   Urinalysis: No results for input(s): COLORURINE, LABSPEC, PHURINE, GLUCOSEU, HGBUR, BILIRUBINUR, KETONESUR, PROTEINUR, UROBILINOGEN, NITRITE, LEUKOCYTESUR in the last 72 hours.  Invalid input(s): APPERANCEUR    Imaging: DG Chest Port 1 View  Result Date: 08/30/2020 CLINICAL DATA:  Shortness of breath EXAM: PORTABLE CHEST 1 VIEW COMPARISON:  Chest x-ray 08/02/2020 FINDINGS: Tracheostomy tube in stable position. Similar-appearing enlarged cardiac silhouette. The heart and mediastinal contours are unchanged. No focal consolidation. Persistent increased interstitial markings. Increased patchy airspace opacities of the lung bases. Trace bilateral pleural effusion. No pneumothorax. No acute osseous abnormality. IMPRESSION: Cardiomegaly with pulmonary edema and bilateral trace pleural effusions. Superimposed infection/inflammation at the lung bases not excluded. Electronically  Signed   By: Iven Finn M.D.   On: 08/30/2020 20:25     Medications:       Assessment/ Plan:  45 y.o. male with a PMHx of ESRD on HD, posterior reversible encephalopathy syndrome, diabetes mellitus type 2, chronic systolic heart failure, anemia of chronic kidney disease, secondary hyperparathyroidism, malignant hypertension, acute respiratory failure, who was admitted to [facility] on 06/29/2020 for ongoing treatment of acute respiratory failure, posterior reversible encephalopathy syndrome, and end-stage renal disease.   1.  ESRD on HD.  Patient will be due for hemodialysis treatment today.  Outpatient disposition still in question as staff has not been able to  find a nursing home thus far.  2.  Anemia of chronic kidney disease.  Hemoglobin currently 7.1.  Maintain the patient on Retacrit 6000 units IV on MWF schedule.  3.  Secondary hyperparathyroidism.  Phosphorus 3.1 this AM.  4.  Acute respiratory failure.  Tracheostomy placed 07/17/2020.  Respiratory status appears stable.  5.  Hypertension.  Maintain the patient on amlodipine, clonidine, hydralazine, losartan.    LOS: 0 Vaughn Frieze 8/29/20227:53 AM

## 2020-09-01 ENCOUNTER — Inpatient Hospital Stay (HOSPITAL_BASED_OUTPATIENT_CLINIC_OR_DEPARTMENT_OTHER)
Admission: RE | Admit: 2020-09-01 | Discharge: 2020-09-01 | Disposition: A | Discharge status: Home / Self Care | Payer: Medicare Other | Source: Other Acute Inpatient Hospital | Attending: Internal Medicine | Admitting: Internal Medicine

## 2020-09-01 DIAGNOSIS — R569 Unspecified convulsions: Secondary | ICD-10-CM

## 2020-09-01 DIAGNOSIS — J181 Lobar pneumonia, unspecified organism: Secondary | ICD-10-CM | POA: Diagnosis not present

## 2020-09-01 DIAGNOSIS — N186 End stage renal disease: Secondary | ICD-10-CM | POA: Diagnosis not present

## 2020-09-01 DIAGNOSIS — J9621 Acute and chronic respiratory failure with hypoxia: Secondary | ICD-10-CM | POA: Diagnosis not present

## 2020-09-01 DIAGNOSIS — I5032 Chronic diastolic (congestive) heart failure: Secondary | ICD-10-CM | POA: Diagnosis not present

## 2020-09-01 LAB — TYPE AND SCREEN
ABO/RH(D): O POS
Antibody Screen: NEGATIVE
Unit division: 0

## 2020-09-01 LAB — BPAM RBC
Blood Product Expiration Date: 202209292359
ISSUE DATE / TIME: 202208291253
Unit Type and Rh: 5100

## 2020-09-01 NOTE — Progress Notes (Signed)
EEG complete - results pending 

## 2020-09-01 NOTE — Progress Notes (Signed)
Pulmonary [place]  PROGRESS NOTE     Jeffery Andrews  [id number]  DOB: January 29, 1975   DOA: 07/17/2020  Referring Physician: Satira Sark, MD  HPI: Jeffery Andrews is a 45 y.o. male being followed for ventilator/airway/oxygen weaning Acute on Chronic Respiratory Failure.  He is capping doing well overall.  No changes are noted.  Patient did have possible aspiration event from which she is doing fairly better  Medications: Reviewed on Rounds  Physical Exam:  Vitals: Temperature 97.6 pulse 7072 respiratory rate is 15 blood pressure is 154/81 saturations 100%  Ventilator Settings capping on room air  General: Comfortable at this time Neck: supple Cardiovascular: no malignant arrhythmias Respiratory: No rhonchi very coarse breath sounds Skin: no rash seen on limited exam Musculoskeletal: No gross abnormality Psychiatric:unable to assess Neurologic:no involuntary movements         Lab Data:   Basic Metabolic Panel: Recent Labs  Lab 08/26/20 0508 08/28/20 0435 08/31/20 0349  NA 130* 132* 127*  K 4.5 4.0 4.2  CL 92* 91* 87*  CO2 '26 28 26  '$ GLUCOSE 197* 178* 320*  BUN 105* 67* 68*  CREATININE 4.93* 4.24* 5.06*  CALCIUM 9.9 9.9 9.5  PHOS 2.1* 2.1* 3.1    ABG: No results for input(s): PHART, PCO2ART, PO2ART, HCO3, O2SAT in the last 168 hours.  Liver Function Tests: Recent Labs  Lab 08/26/20 0508 08/28/20 0435 08/31/20 0349  ALBUMIN 2.1* 2.1* 2.0*   No results for input(s): LIPASE, AMYLASE in the last 168 hours. No results for input(s): AMMONIA in the last 168 hours.  CBC: Recent Labs  Lab 08/26/20 0508 08/28/20 0435 08/31/20 0349 08/31/20 1631  WBC 8.8 7.9 18.1*  --   HGB 7.6* 7.0* 7.1* 8.1*  HCT 23.7* 23.0* 22.7* 26.8*  MCV 80.3 81.0 80.5  --   PLT 245 266 294  --     Cardiac Enzymes: No results for input(s): CKTOTAL, CKMB, CKMBINDEX, TROPONINI in the last  168 hours.  BNP (last 3 results) No results for input(s): BNP in the last 8760 hours.  ProBNP (last 3 results) No results for input(s): PROBNP in the last 8760 hours.  Radiological Exams: DG CHEST PORT 1 VIEW  Result Date: 08/31/2020 CLINICAL DATA:  Pneumonia. EXAM: PORTABLE CHEST 1 VIEW COMPARISON:  August 30, 2020. FINDINGS: Stable cardiomegaly. Tracheostomy tube is unchanged. No pneumothorax is noted. Mild bibasilar subsegmental atelectasis is noted with possible small pleural effusions. Bony thorax is unremarkable. IMPRESSION: Mild bibasilar subsegmental atelectasis is noted with possible small pleural effusions. Electronically Signed   By: Marijo Conception M.D.   On: 08/31/2020 10:28   DG Chest Port 1 View  Result Date: 08/30/2020 CLINICAL DATA:  Shortness of breath EXAM: PORTABLE CHEST 1 VIEW COMPARISON:  Chest x-ray 08/02/2020 FINDINGS: Tracheostomy tube in stable position. Similar-appearing enlarged cardiac silhouette. The heart and mediastinal contours are unchanged. No focal consolidation. Persistent increased interstitial markings. Increased patchy airspace opacities of the lung bases. Trace bilateral pleural effusion. No pneumothorax. No acute osseous abnormality. IMPRESSION: Cardiomegaly with pulmonary edema and bilateral trace pleural effusions. Superimposed infection/inflammation at the lung bases not excluded. Electronically Signed   By: Iven Finn M.D.   On: 08/30/2020 20:25    Assessment/Plan Active Problems:   Acute on chronic respiratory failure with hypoxia (HCC)   End stage renal disease on dialysis (HCC)   Lobar pneumonia, unspecified organism (HCC)   Chronic diastolic CHF (congestive heart  failure) ([place])   Acute on chronic respiratory failure with hypoxia we will proceed with capping as ordered secretions are Minimal continue with pulmonary toilet as warranted End-stage renal failure no changes noted patient is on hemodialysis Lobar pneumonia with superimposed  possible aspiration being treated for the aspiration with antibiotic Chronic diastolic heart failure compensated EEG ordered pending   I have personally seen and evaluated the patient, evaluated laboratory and imaging results, formulated the assessment and plan and placed orders. The Patient requires high complexity decision making with multiple systems involvement.  Rounds were done with the Respiratory Therapy Director and Staff therapists and discussed with nursing staff also.  Allyne Gee, MD [facility] Pulmonary Critical Care Medicine Sleep Medicine

## 2020-09-01 NOTE — Procedures (Signed)
Patient Name: Jeffery Andrews  MRN: [id number]  Epilepsy Attending: Lora Havens  Referring Physician/Provider: Dr Satira Sark  Date: 09/01/2020 Duration: 23.39 mins  Patient history: 45yo M with h/o PRES. EEG to evaluate for seizure  Level of alertness: Awake  AEDs during EEG study: None  Technical aspects: This EEG study was done with scalp electrodes positioned according to the 10-20 International system of electrode placement. Electrical activity was acquired at a sampling rate of '500Hz'$  and reviewed with a high frequency filter of '70Hz'$  and a low frequency filter of '1Hz'$ . EEG data were recorded continuously and digitally stored.   Description: No posterior dominant rhythm was seen. EEG showed continuous generalized 3 to 6 Hz theta-delta slowing. Hyperventilation and photic stimulation were not performed.     ABNORMALITY - Continuous slow, generalized  IMPRESSION: This study is suggestive of moderate diffuse encephalopathy, nonspecific etiology. No seizures or epileptiform discharges were seen throughout the recording.  Amanuel Sinkfield Barbra Sarks

## 2020-09-02 DIAGNOSIS — J9621 Acute and chronic respiratory failure with hypoxia: Secondary | ICD-10-CM | POA: Diagnosis not present

## 2020-09-02 DIAGNOSIS — N186 End stage renal disease: Secondary | ICD-10-CM | POA: Diagnosis not present

## 2020-09-02 DIAGNOSIS — J181 Lobar pneumonia, unspecified organism: Secondary | ICD-10-CM | POA: Diagnosis not present

## 2020-09-02 DIAGNOSIS — I5032 Chronic diastolic (congestive) heart failure: Secondary | ICD-10-CM | POA: Diagnosis not present

## 2020-09-02 LAB — BASIC METABOLIC PANEL
Anion gap: 14 (ref 5–15)
BUN: 50 mg/dL — ABNORMAL HIGH (ref 6–20)
CO2: 24 mmol/L (ref 22–32)
Calcium: 9.5 mg/dL (ref 8.9–10.3)
Chloride: 93 mmol/L — ABNORMAL LOW (ref 98–111)
Creatinine, Ser: 4.75 mg/dL — ABNORMAL HIGH (ref 0.61–1.24)
GFR, Estimated: 14 mL/min — ABNORMAL LOW (ref >60)
Glucose, Bld: 191 mg/dL — ABNORMAL HIGH (ref 70–99)
Potassium: 4.1 mmol/L (ref 3.5–5.1)
Sodium: 131 mmol/L — ABNORMAL LOW (ref 135–145)

## 2020-09-02 LAB — RENAL FUNCTION PANEL
Albumin: 1.8 g/dL — ABNORMAL LOW (ref 3.5–5.0)
Anion gap: 15 (ref 5–15)
BUN: 43 mg/dL — ABNORMAL HIGH (ref 6–20)
CO2: 21 mmol/L — ABNORMAL LOW (ref 22–32)
Calcium: 8.3 mg/dL — ABNORMAL LOW (ref 8.9–10.3)
Chloride: 90 mmol/L — ABNORMAL LOW (ref 98–111)
Creatinine, Ser: 4.05 mg/dL — ABNORMAL HIGH (ref 0.61–1.24)
GFR, Estimated: 18 mL/min — ABNORMAL LOW (ref >60)
Glucose, Bld: 660 mg/dL (ref 70–99)
Phosphorus: 3 mg/dL (ref 2.5–4.6)
Potassium: 3.8 mmol/L (ref 3.5–5.1)
Sodium: 126 mmol/L — ABNORMAL LOW (ref 135–145)

## 2020-09-02 LAB — CBC
HCT: 24.1 % — ABNORMAL LOW (ref 39.0–52.0)
Hemoglobin: 7.6 g/dL — ABNORMAL LOW (ref 13.0–17.0)
MCH: 25.9 pg — ABNORMAL LOW (ref 26.0–34.0)
MCHC: 31.5 g/dL (ref 30.0–36.0)
MCV: 82.3 fL (ref 80.0–100.0)
Platelets: 271 10*3/uL (ref 150–400)
RBC: 2.93 MIL/uL — ABNORMAL LOW (ref 4.22–5.81)
RDW: 19.3 % — ABNORMAL HIGH (ref 11.5–15.5)
WBC: 18.9 10*3/uL — ABNORMAL HIGH (ref 4.0–10.5)
nRBC: 0 % (ref 0.0–0.2)

## 2020-09-02 NOTE — Progress Notes (Signed)
[facility]  ROUNDING NOTE   Subjective:  Patient sitting up in chair again this a.m. Neck is turned towards the left as before. Recent EEG result reviewed and appears to be suggestive of moderate diffuse encephalopathy.  Objective:  Vital signs in last 24 hours:  Temperature 98.1 pulse 70 respirations 34 blood pressure 155/86  Physical Exam: General:  No acute distress  Head:  Normocephalic, atraumatic.    Eyes:  Anicteric  Neck:  Tracheostomy in place  Lungs:   Clear to auscultation, normal effort  Heart:  S1S2 no rubs  Abdomen:   Soft, nontender, bowel sounds present  Extremities:  Trace peripheral edema.  Neurologic:  Neck deviation to the left, contractures in both hands  Skin:  No acute skin rash  Access:  Left upper extremity AV fistula    Basic Metabolic Panel: Recent Labs  Lab 08/28/20 0435 08/31/20 0349  NA 132* 127*  K 4.0 4.2  CL 91* 87*  CO2 28 26  GLUCOSE 178* 320*  BUN 67* 68*  CREATININE 4.24* 5.06*  CALCIUM 9.9 9.5  PHOS 2.1* 3.1     Liver Function Tests: Recent Labs  Lab 08/28/20 0435 08/31/20 0349  ALBUMIN 2.1* 2.0*    No results for input(s): LIPASE, AMYLASE in the last 168 hours. No results for input(s): AMMONIA in the last 168 hours.  CBC: Recent Labs  Lab 08/28/20 0435 08/31/20 0349 08/31/20 1631 09/02/20 0451  WBC 7.9 18.1*  --  18.9*  HGB 7.0* 7.1* 8.1* 7.6*  HCT 23.0* 22.7* 26.8* 24.1*  MCV 81.0 80.5  --  82.3  PLT 266 294  --  271     Cardiac Enzymes: No results for input(s): CKTOTAL, CKMB, CKMBINDEX, TROPONINI in the last 168 hours.  BNP: Invalid input(s): POCBNP  CBG: No results for input(s): GLUCAP in the last 168 hours.  Microbiology: Results for orders placed or performed during the hospital encounter of 07/17/20  Culture, blood (routine x 2)     Status: None   Collection Time: 07/27/20 11:17 AM   Specimen: BLOOD RIGHT ARM  Result Value Ref Range Status   Specimen Description BLOOD RIGHT  ARM  Final   Special Requests   Final    BOTTLES DRAWN AEROBIC ONLY Blood Culture results may not be optimal due to an inadequate volume of blood received in culture bottles   Culture   Final    NO GROWTH 5 DAYS Performed at [facility], [place] [address], [place], [place] [address]    Report Status 08/01/2020 FINAL  Final  Culture, blood (routine x 2)     Status: None   Collection Time: 07/27/20 11:23 AM   Specimen: BLOOD RIGHT HAND  Result Value Ref Range Status   Specimen Description BLOOD RIGHT HAND  Final   Special Requests   Final    BOTTLES DRAWN AEROBIC ONLY Blood Culture results may not be optimal due to an inadequate volume of blood received in culture bottles   Culture   Final    NO GROWTH 5 DAYS Performed at [facility], [place] [address] [place], [place] [address]    Report Status 08/01/2020 FINAL  Final  Culture, blood (routine x 2)     Status: None   Collection Time: 08/01/20  2:25 PM   Specimen: BLOOD RIGHT HAND  Result Value Ref Range Status   Specimen Description BLOOD RIGHT HAND  Final   Special Requests   Final    BOTTLES  DRAWN AEROBIC AND ANAEROBIC Blood Culture results may not be optimal due to an inadequate volume of blood received in culture bottles   Culture   Final    NO GROWTH 5 DAYS Performed at [facility], [place] [address], [place], [place] [address]    Report Status 08/06/2020 FINAL  Final  Culture, blood (routine x 2)     Status: None   Collection Time: 08/01/20  2:39 PM   Specimen: BLOOD RIGHT ARM  Result Value Ref Range Status   Specimen Description BLOOD RIGHT ARM  Final   Special Requests   Final    BOTTLES DRAWN AEROBIC ONLY Blood Culture results may not be optimal due to an inadequate volume of blood received in culture bottles   Culture   Final    NO GROWTH 5 DAYS Performed at [facility], [place] [address], [place], [place] [address]    Report Status 08/06/2020 FINAL  Final  Culture, Respiratory w Gram Stain      Status: None   Collection Time: 08/01/20  2:55 PM   Specimen: Tracheal Aspirate; Respiratory  Result Value Ref Range Status   Specimen Description TRACHEAL ASPIRATE  Final   Special Requests NONE  Final   Gram Stain   Final    FEW WBC PRESENT, PREDOMINANTLY PMN MODERATE GRAM NEGATIVE RODS Performed at [facility], [place] [address] [place], [place] [address]    Culture ABUNDANT PSEUDOMONAS AERUGINOSA  Final   Report Status 08/04/2020 FINAL  Final   Organism ID, Bacteria PSEUDOMONAS AERUGINOSA  Final      Susceptibility   Pseudomonas aeruginosa - MIC*    CEFTAZIDIME <=1 SENSITIVE Sensitive     CIPROFLOXACIN <=0.25 SENSITIVE Sensitive     GENTAMICIN 8 INTERMEDIATE Intermediate     IMIPENEM >=16 RESISTANT Resistant     PIP/TAZO <=4 SENSITIVE Sensitive     CEFEPIME 2 SENSITIVE Sensitive     * ABUNDANT PSEUDOMONAS AERUGINOSA    Coagulation Studies: No results for input(s): LABPROT, INR in the last 72 hours.   Urinalysis: No results for input(s): COLORURINE, LABSPEC, PHURINE, GLUCOSEU, HGBUR, BILIRUBINUR, KETONESUR, PROTEINUR, UROBILINOGEN, NITRITE, LEUKOCYTESUR in the last 72 hours.  Invalid input(s): APPERANCEUR    Imaging: DG CHEST PORT 1 VIEW  Result Date: 08/31/2020 CLINICAL DATA:  Pneumonia. EXAM: PORTABLE CHEST 1 VIEW COMPARISON:  August 30, 2020. FINDINGS: Stable cardiomegaly. Tracheostomy tube is unchanged. No pneumothorax is noted. Mild bibasilar subsegmental atelectasis is noted with possible small pleural effusions. Bony thorax is unremarkable. IMPRESSION: Mild bibasilar subsegmental atelectasis is noted with possible small pleural effusions. Electronically Signed   By: Marijo Conception M.D.   On: 08/31/2020 10:28   EEG adult  Result Date: 09/01/2020 Lora Havens, MD     09/01/2020 11:30 AM Patient Name: Jeffery Andrews MRN: [id number] Epilepsy Attending: Lora Havens Referring Physician/Provider: Dr Satira Sark Date: 09/01/2020 Duration: 23.39 mins  Patient history: 45yo M with h/o PRES. EEG to evaluate for seizure Level of alertness: Awake AEDs during EEG study: None Technical aspects: This EEG study was done with scalp electrodes positioned according to the 10-20 International system of electrode placement. Electrical activity was acquired at a sampling rate of '500Hz'$  and reviewed with a high frequency filter of '70Hz'$  and a low frequency filter of '1Hz'$ . EEG data were recorded continuously and digitally stored. Description: No posterior dominant rhythm was seen. EEG showed continuous generalized 3 to 6 Hz theta-delta slowing. Hyperventilation and photic stimulation were  not performed.   ABNORMALITY - Continuous slow, generalized IMPRESSION: This study is suggestive of moderate diffuse encephalopathy, nonspecific etiology. No seizures or epileptiform discharges were seen throughout the recording. Lora Havens     Medications:       Assessment/ Plan:  45 y.o. male with a PMHx of ESRD on HD, posterior reversible encephalopathy syndrome, diabetes mellitus type 2, chronic systolic heart failure, anemia of chronic kidney disease, secondary hyperparathyroidism, malignant hypertension, acute respiratory failure, who was admitted to [facility] on 06/29/2020 for ongoing treatment of acute respiratory failure, posterior reversible encephalopathy syndrome, and end-stage renal disease.   1.  ESRD on HD.  Maintain the patient on MWF dialysis treatment schedule.  2.  Anemia of chronic kidney disease.  Hemoglobin down to 7.6.  Continue Retacrit [id number] with dialysis.  3.  Secondary hyperparathyroidism.  Continue to monitor bone metabolism parameters.  4.  Acute respiratory failure.  Tracheostomy placed 07/17/2020.  Continues to tolerate capped tracheostomy at the moment.  5.  Hypertension.  Maintain the patient on amlodipine, clonidine, hydralazine, losartan.  Blood pressure this a.m. was 155/86.    LOS: 0 Jeffery Andrews 8/31/20228:02 AM

## 2020-09-02 NOTE — Progress Notes (Signed)
[facility]  PROGRESS NOTE     Jeffery Andrews  [id number]  DOB: Jan 22, 1975   DOA: 07/17/2020  Referring Physician: Satira Sark, MD  HPI: Jeffery Andrews is a 45 y.o. male being followed for ventilator/airway/oxygen weaning Acute on Chronic Respiratory Failure.  Patient is comfortable right now without distress has been afebrile capping on room air  Medications: Reviewed on Rounds  Physical Exam:  Vitals: Temperature is 98.1 pulse 70 respiratory 34 blood pressure is 155/86 saturations 100%  Ventilator Settings capping on room air  General: Comfortable at this time Neck: supple Cardiovascular: no malignant arrhythmias Respiratory: No rhonchi very coarse breath sounds Skin: no rash seen on limited exam Musculoskeletal: No gross abnormality Psychiatric:unable to assess Neurologic:no involuntary movements         Lab Data:   Basic Metabolic Panel: Recent Labs  Lab 08/28/20 0435 08/31/20 0349  NA 132* 127*  K 4.0 4.2  CL 91* 87*  CO2 28 26  GLUCOSE 178* 320*  BUN 67* 68*  CREATININE 4.24* 5.06*  CALCIUM 9.9 9.5  PHOS 2.1* 3.1    ABG: No results for input(s): PHART, PCO2ART, PO2ART, HCO3, O2SAT in the last 168 hours.  Liver Function Tests: Recent Labs  Lab 08/28/20 0435 08/31/20 0349  ALBUMIN 2.1* 2.0*   No results for input(s): LIPASE, AMYLASE in the last 168 hours. No results for input(s): AMMONIA in the last 168 hours.  CBC: Recent Labs  Lab 08/28/20 0435 08/31/20 0349 08/31/20 1631 09/02/20 0451  WBC 7.9 18.1*  --  18.9*  HGB 7.0* 7.1* 8.1* 7.6*  HCT 23.0* 22.7* 26.8* 24.1*  MCV 81.0 80.5  --  82.3  PLT 266 294  --  271    Cardiac Enzymes: No results for input(s): CKTOTAL, CKMB, CKMBINDEX, TROPONINI in the last 168 hours.  BNP (last 3 results) No results for input(s): BNP in the last 8760 hours.  ProBNP (last 3 results) No results for  input(s): PROBNP in the last 8760 hours.  Radiological Exams: DG CHEST PORT 1 VIEW  Result Date: 08/31/2020 CLINICAL DATA:  Pneumonia. EXAM: PORTABLE CHEST 1 VIEW COMPARISON:  August 30, 2020. FINDINGS: Stable cardiomegaly. Tracheostomy tube is unchanged. No pneumothorax is noted. Mild bibasilar subsegmental atelectasis is noted with possible small pleural effusions. Bony thorax is unremarkable. IMPRESSION: Mild bibasilar subsegmental atelectasis is noted with possible small pleural effusions. Electronically Signed   By: Marijo Conception M.D.   On: 08/31/2020 10:28   EEG adult  Result Date: 09/01/2020 Lora Havens, MD     09/01/2020 11:30 AM Patient Name: Jeffery Andrews MRN: [id number] Epilepsy Attending: Lora Havens Referring Physician/Provider: Dr Satira Sark Date: 09/01/2020 Duration: 23.39 mins Patient history: 45yo M with h/o PRES. EEG to evaluate for seizure Level of alertness: Awake AEDs during EEG study: None Technical aspects: This EEG study was done with scalp electrodes positioned according to the 10-20 International system of electrode placement. Electrical activity was acquired at a sampling rate of '500Hz'$  and reviewed with a high frequency filter of '70Hz'$  and a low frequency filter of '1Hz'$ . EEG data were recorded continuously and digitally stored. Description: No posterior dominant rhythm was seen. EEG showed continuous generalized 3 to 6 Hz theta-delta slowing. Hyperventilation and photic stimulation were not performed.   ABNORMALITY - Continuous slow, generalized IMPRESSION: This study is suggestive of moderate diffuse encephalopathy, nonspecific etiology. No seizures or epileptiform discharges were seen throughout  the recording. [name]    Assessment/Plan Active Problems:   Acute on chronic respiratory failure with hypoxia (HCC)   End stage renal disease on dialysis (HCC)   Lobar pneumonia, unspecified organism (HCC)   Chronic diastolic CHF (congestive heart  failure) (HCC)   Acute on chronic respiratory failure hypoxia we will continue with the capping trials titrate oxygen as tolerated continue pulmonary toilet. End-stage renal failure on hemodialysis we will continue to monitor closely. Lobar pneumonia treated supportive care Chronic diastolic heart failure appears to be compensated monitor fluid status Tracheostomy right now remains in place   I have personally seen and evaluated the patient, evaluated laboratory and imaging results, formulated the assessment and plan and placed orders. The Patient requires high complexity decision making with multiple systems involvement.  Rounds were done with the Respiratory Therapy Director and Staff therapists and discussed with nursing staff also.  Allyne Gee, MD [facility] Pulmonary Critical Care Medicine Sleep Medicine

## 2020-09-03 DIAGNOSIS — J181 Lobar pneumonia, unspecified organism: Secondary | ICD-10-CM | POA: Diagnosis not present

## 2020-09-03 DIAGNOSIS — J9621 Acute and chronic respiratory failure with hypoxia: Secondary | ICD-10-CM | POA: Diagnosis not present

## 2020-09-03 DIAGNOSIS — I5032 Chronic diastolic (congestive) heart failure: Secondary | ICD-10-CM | POA: Diagnosis not present

## 2020-09-03 DIAGNOSIS — N186 End stage renal disease: Secondary | ICD-10-CM | POA: Diagnosis not present

## 2020-09-03 NOTE — Progress Notes (Signed)
[facility]  PROGRESS NOTE     BYRAN HOLDER  [id number]  DOB: January 25, 1975   DOA: 07/17/2020  Referring Physician: Satira Sark, MD  HPI: YITZCHOK OVITT is a 45 y.o. male being followed for ventilator/airway/oxygen weaning Acute on Chronic Respiratory Failure.  Patient is on capping remains off the ventilator no distress is noted at this time.  Medications: Reviewed on Rounds  Physical Exam:  Vitals: Temperature is 100.4 pulse 76 respiratory is 23 blood pressure is 168/81 saturations 100%  Ventilator Settings capping off the ventilator  General: Comfortable at this time Neck: supple Cardiovascular: no malignant arrhythmias Respiratory: No rhonchi very coarse breath sound Skin: no rash seen on limited exam Musculoskeletal: No gross abnormality Psychiatric:unable to assess Neurologic:no involuntary movements         Lab Data:   Basic Metabolic Panel: Recent Labs  Lab 08/28/20 0435 08/31/20 0349 09/02/20 0451 09/02/20 0952  NA 132* 127* 126* 131*  K 4.0 4.2 3.8 4.1  CL 91* 87* 90* 93*  CO2 28 26 21* 24  GLUCOSE 178* 320* 660* 191*  BUN 67* 68* 43* 50*  CREATININE 4.24* 5.06* 4.05* 4.75*  CALCIUM 9.9 9.5 8.3* 9.5  PHOS 2.1* 3.1 3.0  --     ABG: No results for input(s): PHART, PCO2ART, PO2ART, HCO3, O2SAT in the last 168 hours.  Liver Function Tests: Recent Labs  Lab 08/28/20 0435 08/31/20 0349 09/02/20 0451  ALBUMIN 2.1* 2.0* 1.8*   No results for input(s): LIPASE, AMYLASE in the last 168 hours. No results for input(s): AMMONIA in the last 168 hours.  CBC: Recent Labs  Lab 08/28/20 0435 08/31/20 0349 08/31/20 1631 09/02/20 0451  WBC 7.9 18.1*  --  18.9*  HGB 7.0* 7.1* 8.1* 7.6*  HCT 23.0* 22.7* 26.8* 24.1*  MCV 81.0 80.5  --  82.3  PLT 266 294  --  271    Cardiac Enzymes: No results for input(s): CKTOTAL, CKMB, CKMBINDEX, TROPONINI in the  last 168 hours.  BNP (last 3 results) No results for input(s): BNP in the last 8760 hours.  ProBNP (last 3 results) No results for input(s): PROBNP in the last 8760 hours.  Radiological Exams: EEG adult  Result Date: 09/01/2020 Lora Havens, MD     09/01/2020 11:30 AM Patient Name: DYSHAUN OUDERKIRK MRN: [id number] Epilepsy Attending: Lora Havens Referring Physician/Provider: Dr Satira Sark Date: 09/01/2020 Duration: 23.39 mins Patient history: 45yo M with h/o PRES. EEG to evaluate for seizure Level of alertness: Awake AEDs during EEG study: None Technical aspects: This EEG study was done with scalp electrodes positioned according to the 10-20 International system of electrode placement. Electrical activity was acquired at a sampling rate of '500Hz'$  and reviewed with a high frequency filter of '70Hz'$  and a low frequency filter of '1Hz'$ . EEG data were recorded continuously and digitally stored. Description: No posterior dominant rhythm was seen. EEG showed continuous generalized 3 to 6 Hz theta-delta slowing. Hyperventilation and photic stimulation were not performed.   ABNORMALITY - Continuous slow, generalized IMPRESSION: This study is suggestive of moderate diffuse encephalopathy, nonspecific etiology. No seizures or epileptiform discharges were seen throughout the recording. [name]    Assessment/Plan Active Problems:   Acute on chronic respiratory failure with hypoxia (HCC)   End stage renal disease on dialysis (HCC)   Lobar pneumonia, unspecified organism (HCC)   Chronic diastolic CHF (congestive heart failure) (HCC)   Acute  on chronic respiratory failure hypoxia patient remains about the same now has a low-grade fever which will be worked up.  We will continue with the capping for now End-stage renal failure noted to be stable.  Being seen by nephrology for dialysis Lobar pneumonia unchanged supportive care we will continue to monitor radiologically. Anoxic  encephalopathy.  EEG was done again shows continued slow wave generalized changes.  With moderate diffuse encephalopathy nonspecific Chronic diastolic heart failure right now appears to be compensated we will continue to monitor   I have personally seen and evaluated the patient, evaluated laboratory and imaging results, formulated the assessment and plan and placed orders. The Patient requires high complexity decision making with multiple systems involvement.  Rounds were done with the Respiratory Therapy Director and Staff therapists and discussed with nursing staff also.  Allyne Gee, MD [facility] Pulmonary Critical Care Medicine Sleep Medicine

## 2020-09-04 LAB — RENAL FUNCTION PANEL
Albumin: 1.8 g/dL — ABNORMAL LOW (ref 3.5–5.0)
Anion gap: 11 (ref 5–15)
BUN: 47 mg/dL — ABNORMAL HIGH (ref 6–20)
CO2: 26 mmol/L (ref 22–32)
Calcium: 9.2 mg/dL (ref 8.9–10.3)
Chloride: 94 mmol/L — ABNORMAL LOW (ref 98–111)
Creatinine, Ser: 4.53 mg/dL — ABNORMAL HIGH (ref 0.61–1.24)
GFR, Estimated: 15 mL/min — ABNORMAL LOW (ref >60)
Glucose, Bld: 116 mg/dL — ABNORMAL HIGH (ref 70–99)
Phosphorus: 3 mg/dL (ref 2.5–4.6)
Potassium: 3.9 mmol/L (ref 3.5–5.1)
Sodium: 131 mmol/L — ABNORMAL LOW (ref 135–145)

## 2020-09-04 LAB — CBC
HCT: 27.2 % — ABNORMAL LOW (ref 39.0–52.0)
Hemoglobin: 8.6 g/dL — ABNORMAL LOW (ref 13.0–17.0)
MCH: 25.7 pg — ABNORMAL LOW (ref 26.0–34.0)
MCHC: 31.6 g/dL (ref 30.0–36.0)
MCV: 81.2 fL (ref 80.0–100.0)
Platelets: 277 10*3/uL (ref 150–400)
RBC: 3.35 MIL/uL — ABNORMAL LOW (ref 4.22–5.81)
RDW: 19.4 % — ABNORMAL HIGH (ref 11.5–15.5)
WBC: 19.5 10*3/uL — ABNORMAL HIGH (ref 4.0–10.5)
nRBC: 0 % (ref 0.0–0.2)

## 2020-09-04 NOTE — Progress Notes (Signed)
[facility]  ROUNDING NOTE   Subjective:  Patient seen and evaluated during hemodialysis treatment. Appears to be tolerating treatment well. No significant neurologic change.  Objective:  Vital signs in last 24 hours:  Temperature 90.5 pulse 89 respirations 12 blood pressure 148/69  Physical Exam: General:  No acute distress  Head:  Normocephalic, atraumatic.    Eyes:  Anicteric  Neck:  Tracheostomy in place  Lungs:   Clear to auscultation, normal effort  Heart:  S1S2 no rubs  Abdomen:   Soft, nontender, bowel sounds present  Extremities:  Trace peripheral edema.  Neurologic:  Neck deviation to the left, contractures in both hands  Skin:  No acute skin rash  Access:  Left upper extremity AV fistula    Basic Metabolic Panel: Recent Labs  Lab 08/31/20 0349 09/02/20 0451 09/02/20 0952 09/04/20 0421  NA 127* 126* 131* 131*  K 4.2 3.8 4.1 3.9  CL 87* 90* 93* 94*  CO2 26 21* 24 26  GLUCOSE 320* 660* 191* 116*  BUN 68* 43* 50* 47*  CREATININE 5.06* 4.05* 4.75* 4.53*  CALCIUM 9.5 8.3* 9.5 9.2  PHOS 3.1 3.0  --  3.0     Liver Function Tests: Recent Labs  Lab 08/31/20 0349 09/02/20 0451 09/04/20 0421  ALBUMIN 2.0* 1.8* 1.8*    No results for input(s): LIPASE, AMYLASE in the last 168 hours. No results for input(s): AMMONIA in the last 168 hours.  CBC: Recent Labs  Lab 08/31/20 0349 08/31/20 1631 09/02/20 0451 09/04/20 0421  WBC 18.1*  --  18.9* 19.5*  HGB 7.1* 8.1* 7.6* 8.6*  HCT 22.7* 26.8* 24.1* 27.2*  MCV 80.5  --  82.3 81.2  PLT 294  --  271 277     Cardiac Enzymes: No results for input(s): CKTOTAL, CKMB, CKMBINDEX, TROPONINI in the last 168 hours.  BNP: Invalid input(s): POCBNP  CBG: No results for input(s): GLUCAP in the last 168 hours.  Microbiology: Results for orders placed or performed during the hospital encounter of 07/17/20  Culture, blood (routine x 2)     Status: None   Collection Time: 07/27/20 11:17 AM    Specimen: BLOOD RIGHT ARM  Result Value Ref Range Status   Specimen Description BLOOD RIGHT ARM  Final   Special Requests   Final    BOTTLES DRAWN AEROBIC ONLY Blood Culture results may not be optimal due to an inadequate volume of blood received in culture bottles   Culture   Final    NO GROWTH 5 DAYS Performed at [facility], [place] [address] [place], [place] [address]    Report Status 08/01/2020 FINAL  Final  Culture, blood (routine x 2)     Status: None   Collection Time: 07/27/20 11:23 AM   Specimen: BLOOD RIGHT HAND  Result Value Ref Range Status   Specimen Description BLOOD RIGHT HAND  Final   Special Requests   Final    BOTTLES DRAWN AEROBIC ONLY Blood Culture results may not be optimal due to an inadequate volume of blood received in culture bottles   Culture   Final    NO GROWTH 5 DAYS Performed at [facility], [place] [address] [place], [place] [address]    Report Status 08/01/2020 FINAL  Final  Culture, blood (routine x 2)     Status: None   Collection Time: 08/01/20  2:25 PM   Specimen: BLOOD RIGHT HAND  Result Value Ref Range Status   Specimen Description BLOOD  RIGHT HAND  Final   Special Requests   Final    BOTTLES DRAWN AEROBIC AND ANAEROBIC Blood Culture results may not be optimal due to an inadequate volume of blood received in culture bottles   Culture   Final    NO GROWTH 5 DAYS Performed at [facility], [place] [address] [place], [place] [address]    Report Status 08/06/2020 FINAL  Final  Culture, blood (routine x 2)     Status: None   Collection Time: 08/01/20  2:39 PM   Specimen: BLOOD RIGHT ARM  Result Value Ref Range Status   Specimen Description BLOOD RIGHT ARM  Final   Special Requests   Final    BOTTLES DRAWN AEROBIC ONLY Blood Culture results may not be optimal due to an inadequate volume of blood received in culture bottles   Culture   Final    NO GROWTH 5 DAYS Performed at [facility], [place] [address], [place],  [place] [address]    Report Status 08/06/2020 FINAL  Final  Culture, Respiratory w Gram Stain     Status: None   Collection Time: 08/01/20  2:55 PM   Specimen: Tracheal Aspirate; Respiratory  Result Value Ref Range Status   Specimen Description TRACHEAL ASPIRATE  Final   Special Requests NONE  Final   Gram Stain   Final    FEW WBC PRESENT, PREDOMINANTLY PMN MODERATE GRAM NEGATIVE RODS Performed at [facility], [place] [address], [place], [place] [address]    Culture ABUNDANT PSEUDOMONAS AERUGINOSA  Final   Report Status 08/04/2020 FINAL  Final   Organism ID, Bacteria PSEUDOMONAS AERUGINOSA  Final      Susceptibility   Pseudomonas aeruginosa - MIC*    CEFTAZIDIME <=1 SENSITIVE Sensitive     CIPROFLOXACIN <=0.25 SENSITIVE Sensitive     GENTAMICIN 8 INTERMEDIATE Intermediate     IMIPENEM >=16 RESISTANT Resistant     PIP/TAZO <=4 SENSITIVE Sensitive     CEFEPIME 2 SENSITIVE Sensitive     * ABUNDANT PSEUDOMONAS AERUGINOSA    Coagulation Studies: No results for input(s): LABPROT, INR in the last 72 hours.   Urinalysis: No results for input(s): COLORURINE, LABSPEC, PHURINE, GLUCOSEU, HGBUR, BILIRUBINUR, KETONESUR, PROTEINUR, UROBILINOGEN, NITRITE, LEUKOCYTESUR in the last 72 hours.  Invalid input(s): APPERANCEUR    Imaging: No results found.   Medications:       Assessment/ Plan:  45 y.o. male with a PMHx of ESRD on HD, posterior reversible encephalopathy syndrome, diabetes mellitus type 2, chronic systolic heart failure, anemia of chronic kidney disease, secondary hyperparathyroidism, malignant hypertension, acute respiratory failure, who was admitted to [facility] on 06/29/2020 for ongoing treatment of acute respiratory failure, posterior reversible encephalopathy syndrome, and end-stage renal disease.   1.  ESRD on HD.  Patient seen during dialysis treatment.  Tolerating well.  Ultrafiltration target 1.5 kg.  2.  Anemia of chronic kidney disease.  Hemoglobin up to  8.6.  Maintain the patient on Retacrit.  3.  Secondary hyperparathyroidism.  Phosphorus acceptable at 3.0.  4.  Acute respiratory failure.  Tracheostomy placed 07/17/2020.  Appears to be breathing comfortably while his tracheostomy is capped.  5.  Hypertension.  Maintain the patient on amlodipine, clonidine, hydralazine, losartan.  Blood pressure better this a.m. at 148/69.    LOS: 0 Coline Calkin 9/2/20228:30 AM

## 2020-09-07 LAB — RENAL FUNCTION PANEL
Albumin: 1.7 g/dL — ABNORMAL LOW (ref 3.5–5.0)
Anion gap: 12 (ref 5–15)
BUN: 58 mg/dL — ABNORMAL HIGH (ref 6–20)
CO2: 26 mmol/L (ref 22–32)
Calcium: 9.7 mg/dL (ref 8.9–10.3)
Chloride: 98 mmol/L (ref 98–111)
Creatinine, Ser: 5.03 mg/dL — ABNORMAL HIGH (ref 0.61–1.24)
GFR, Estimated: 14 mL/min — ABNORMAL LOW (ref >60)
Glucose, Bld: 127 mg/dL — ABNORMAL HIGH (ref 70–99)
Phosphorus: 2.9 mg/dL (ref 2.5–4.6)
Potassium: 4.5 mmol/L (ref 3.5–5.1)
Sodium: 136 mmol/L (ref 135–145)

## 2020-09-07 LAB — CBC
HCT: 28.3 % — ABNORMAL LOW (ref 39.0–52.0)
Hemoglobin: 8.7 g/dL — ABNORMAL LOW (ref 13.0–17.0)
MCH: 25.1 pg — ABNORMAL LOW (ref 26.0–34.0)
MCHC: 30.7 g/dL (ref 30.0–36.0)
MCV: 81.8 fL (ref 80.0–100.0)
Platelets: 275 10*3/uL (ref 150–400)
RBC: 3.46 MIL/uL — ABNORMAL LOW (ref 4.22–5.81)
RDW: 19.9 % — ABNORMAL HIGH (ref 11.5–15.5)
WBC: 17.6 10*3/uL — ABNORMAL HIGH (ref 4.0–10.5)
nRBC: 0 % (ref 0.0–0.2)

## 2020-09-07 NOTE — Progress Notes (Signed)
[facility]  ROUNDING NOTE   Subjective:  Patient resting comfortably in bed.  Continues on MWF dialysis treatment. Appears to be resting comfortably at the moment. Continues to have capped tracheostomy.  Objective:  Vital signs in last 24 hours:  Temperature 98.8 pulse 71 respiration 34 blood pressure 161/86 but as low as 126/84  Physical Exam: General:  No acute distress  Head:  Normocephalic, atraumatic.    Eyes:  Anicteric  Neck:  Tracheostomy in place  Lungs:   Clear to auscultation, normal effort  Heart:  S1S2 no rubs  Abdomen:   Soft, nontender, bowel sounds present  Extremities:  Trace peripheral edema.  Neurologic:  Neck deviation to the left, contractures in both hands  Skin:  No acute skin rash  Access:  Left upper extremity AV fistula    Basic Metabolic Panel: Recent Labs  Lab 09/02/20 0451 09/02/20 0952 09/04/20 0421 09/07/20 0714  NA 126* 131* 131* 136  K 3.8 4.1 3.9 4.5  CL 90* 93* 94* 98  CO2 21* '24 26 26  '$ GLUCOSE 660* 191* 116* 127*  BUN 43* 50* 47* 58*  CREATININE 4.05* 4.75* 4.53* 5.03*  CALCIUM 8.3* 9.5 9.2 9.7  PHOS 3.0  --  3.0 2.9     Liver Function Tests: Recent Labs  Lab 09/02/20 0451 09/04/20 0421 09/07/20 0714  ALBUMIN 1.8* 1.8* 1.7*    No results for input(s): LIPASE, AMYLASE in the last 168 hours. No results for input(s): AMMONIA in the last 168 hours.  CBC: Recent Labs  Lab 08/31/20 1631 09/02/20 0451 09/04/20 0421 09/07/20 0714  WBC  --  18.9* 19.5* 17.6*  HGB 8.1* 7.6* 8.6* 8.7*  HCT 26.8* 24.1* 27.2* 28.3*  MCV  --  82.3 81.2 81.8  PLT  --  271 277 275     Cardiac Enzymes: No results for input(s): CKTOTAL, CKMB, CKMBINDEX, TROPONINI in the last 168 hours.  BNP: Invalid input(s): POCBNP  CBG: No results for input(s): GLUCAP in the last 168 hours.  Microbiology: Results for orders placed or performed during the hospital encounter of 07/17/20  Culture, blood (routine x 2)     Status: None    Collection Time: 07/27/20 11:17 AM   Specimen: BLOOD RIGHT ARM  Result Value Ref Range Status   Specimen Description BLOOD RIGHT ARM  Final   Special Requests   Final    BOTTLES DRAWN AEROBIC ONLY Blood Culture results may not be optimal due to an inadequate volume of blood received in culture bottles   Culture   Final    NO GROWTH 5 DAYS Performed at [facility], [place] [address], [place], [place] [address]    Report Status 08/01/2020 FINAL  Final  Culture, blood (routine x 2)     Status: None   Collection Time: 07/27/20 11:23 AM   Specimen: BLOOD RIGHT HAND  Result Value Ref Range Status   Specimen Description BLOOD RIGHT HAND  Final   Special Requests   Final    BOTTLES DRAWN AEROBIC ONLY Blood Culture results may not be optimal due to an inadequate volume of blood received in culture bottles   Culture   Final    NO GROWTH 5 DAYS Performed at [facility], [place] [address], [place],  [address]    Report Status 08/01/2020 FINAL  Final  Culture, blood (routine x 2)     Status: None   Collection Time: 08/01/20  2:25 PM   Specimen: BLOOD RIGHT  HAND  Result Value Ref Range Status   Specimen Description BLOOD RIGHT HAND  Final   Special Requests   Final    BOTTLES DRAWN AEROBIC AND ANAEROBIC Blood Culture results may not be optimal due to an inadequate volume of blood received in culture bottles   Culture   Final    NO GROWTH 5 DAYS Performed at [facility], [place] [address] [place], [place] [address]    Report Status 08/06/2020 FINAL  Final  Culture, blood (routine x 2)     Status: None   Collection Time: 08/01/20  2:39 PM   Specimen: BLOOD RIGHT ARM  Result Value Ref Range Status   Specimen Description BLOOD RIGHT ARM  Final   Special Requests   Final    BOTTLES DRAWN AEROBIC ONLY Blood Culture results may not be optimal due to an inadequate volume of blood received in culture bottles   Culture   Final    NO GROWTH 5 DAYS Performed at [facility], [place] [address], [place], [place] [address]    Report Status 08/06/2020 FINAL  Final  Culture, Respiratory w Gram Stain     Status: None   Collection Time: 08/01/20  2:55 PM   Specimen: Tracheal Aspirate; Respiratory  Result Value Ref Range Status   Specimen Description TRACHEAL ASPIRATE  Final   Special Requests NONE  Final   Gram Stain   Final    FEW WBC PRESENT, PREDOMINANTLY PMN MODERATE GRAM NEGATIVE RODS Performed at [facility], [place] [address], [place], [place] [address]    Culture ABUNDANT PSEUDOMONAS AERUGINOSA  Final   Report Status 08/04/2020 FINAL  Final   Organism ID, Bacteria PSEUDOMONAS AERUGINOSA  Final      Susceptibility   Pseudomonas aeruginosa - MIC*    CEFTAZIDIME <=1 SENSITIVE Sensitive     CIPROFLOXACIN <=0.25 SENSITIVE Sensitive     GENTAMICIN 8 INTERMEDIATE Intermediate     IMIPENEM >=16 RESISTANT Resistant     PIP/TAZO <=4 SENSITIVE Sensitive     CEFEPIME 2 SENSITIVE Sensitive     * ABUNDANT PSEUDOMONAS AERUGINOSA    Coagulation Studies: No results for input(s): LABPROT, INR in the last 72 hours.   Urinalysis: No results for input(s): COLORURINE, LABSPEC, PHURINE, GLUCOSEU, HGBUR, BILIRUBINUR, KETONESUR, PROTEINUR, UROBILINOGEN, NITRITE, LEUKOCYTESUR in the last 72 hours.  Invalid input(s): APPERANCEUR    Imaging: No results found.   Medications:       Assessment/ Plan:  45 y.o. male with a PMHx of ESRD on HD, posterior reversible encephalopathy syndrome, diabetes mellitus type 2, chronic systolic heart failure, anemia of chronic kidney disease, secondary hyperparathyroidism, malignant hypertension, acute respiratory failure, who was admitted to [facility] on 06/29/2020 for ongoing treatment of acute respiratory failure, posterior reversible encephalopathy syndrome, and end-stage renal disease.   1.  ESRD on HD.  Due for hemodialysis treatment today.  2.  Anemia of chronic kidney disease.  Hemoglobin up to 8.7.   Continue Retacrit.  3.  Secondary hyperparathyroidism.  Phosphorus acceptable at 2.9.  4.  Acute respiratory failure.  Tracheostomy placed 07/17/2020.  Continues to tolerate capped tracheostomy at the moment.  5.  Hypertension.  Maintain the patient on amlodipine, clonidine, hydralazine, losartan.  Blood pressure earlier today was 161/86 but has been as low as 126/84 over the preceding 24 hours.    LOS: 0 Jeffery Andrews 9/5/20229:07 AM

## 2020-09-08 DIAGNOSIS — J181 Lobar pneumonia, unspecified organism: Secondary | ICD-10-CM | POA: Diagnosis not present

## 2020-09-08 DIAGNOSIS — N186 End stage renal disease: Secondary | ICD-10-CM | POA: Diagnosis not present

## 2020-09-08 DIAGNOSIS — I5032 Chronic diastolic (congestive) heart failure: Secondary | ICD-10-CM | POA: Diagnosis not present

## 2020-09-08 DIAGNOSIS — J9621 Acute and chronic respiratory failure with hypoxia: Secondary | ICD-10-CM | POA: Diagnosis not present

## 2020-09-08 NOTE — Progress Notes (Signed)
Andrews Andrews  PROGRESS NOTE     Andrews Andrews  [id number]  DOB: [birth year]   DOA: 07/17/2020  Referring Physician: Satira Sark, MD  HPI: Andrews Andrews is a 45 y.o. male being followed for ventilator/airway/oxygen weaning Acute on Chronic Respiratory Failure.  Patient comfortable right now without distress Has been capping doing fairly well.  Medications: Reviewed on Rounds  Physical Exam:  Vitals: Temperature is 99.9 pulse 72 respiratory 18 blood pressure is 156/84 saturations 100%  Ventilator Settings capping off the vent  General: Comfortable at this time Neck: supple Cardiovascular: no malignant arrhythmias Respiratory: No rhonchi no rales are noted Skin: no rash seen on limited exam Musculoskeletal: No gross abnormality Psychiatric:unable to assess Neurologic:no involuntary movements         Lab Data:   Basic Metabolic Panel: Recent Labs  Lab 09/02/20 0451 09/02/20 0952 09/04/20 0421 09/07/20 0714  NA 126* 131* 131* 136  K 3.8 4.1 3.9 4.5  CL 90* 93* 94* 98  CO2 21* '24 26 26  '$ GLUCOSE 660* 191* 116* 127*  BUN 43* 50* 47* 58*  CREATININE 4.05* 4.75* 4.53* 5.03*  CALCIUM 8.3* 9.5 9.2 9.7  PHOS 3.0  --  3.0 2.9    ABG: No results for input(s): PHART, PCO2ART, PO2ART, HCO3, O2SAT in the last 168 hours.  Liver Function Tests: Recent Labs  Lab 09/02/20 0451 09/04/20 0421 09/07/20 0714  ALBUMIN 1.8* 1.8* 1.7*   No results for input(s): LIPASE, AMYLASE in the last 168 hours. No results for input(s): AMMONIA in the last 168 hours.  CBC: Recent Labs  Lab 09/02/20 0451 09/04/20 0421 09/07/20 0714  WBC 18.9* 19.5* 17.6*  HGB 7.6* 8.6* 8.7*  HCT 24.1* 27.2* 28.3*  MCV 82.3 81.2 81.8  PLT 271 277 275    Cardiac Enzymes: No results for input(s): CKTOTAL, CKMB, CKMBINDEX, TROPONINI in the last 168 hours.  BNP (last 3 results) No results for  input(s): BNP in the last 8760 hours.  ProBNP (last 3 results) No results for input(s): PROBNP in the last 8760 hours.  Radiological Exams: No results found.  Assessment/Plan Active Problems:   Acute on chronic respiratory failure with hypoxia (HCC)   End stage renal disease on dialysis (HCC)   Lobar pneumonia, unspecified organism (HCC)   Chronic diastolic CHF (congestive heart failure) (HCC)   Acute on chronic respiratory failure hypoxia we will continue with the capping awaiting discharge planning. End-stage renal failure patient is on hemodialysis we will continue to monitor along closely. Lobar pneumonia treated we will continue to follow along closely. Chronic diastolic heart failure compensated we will monitor fluid status Tracheostomy right now remains in place   I have personally seen and evaluated the patient, evaluated laboratory and imaging results, formulated the assessment and plan and placed orders. The Patient requires high complexity decision making with multiple systems involvement.  Rounds were done with the Respiratory Therapy Director and Staff therapists and discussed with nursing staff also.  Allyne Gee, MD [facility] Sleep Medicine

## 2020-09-09 DIAGNOSIS — N186 End stage renal disease: Secondary | ICD-10-CM | POA: Diagnosis not present

## 2020-09-09 DIAGNOSIS — I5032 Chronic diastolic (congestive) heart failure: Secondary | ICD-10-CM | POA: Diagnosis not present

## 2020-09-09 DIAGNOSIS — J9621 Acute and chronic respiratory failure with hypoxia: Secondary | ICD-10-CM | POA: Diagnosis not present

## 2020-09-09 DIAGNOSIS — J181 Lobar pneumonia, unspecified organism: Secondary | ICD-10-CM | POA: Diagnosis not present

## 2020-09-09 LAB — CBC
HCT: 28.3 % — ABNORMAL LOW (ref 39.0–52.0)
Hemoglobin: 8.8 g/dL — ABNORMAL LOW (ref 13.0–17.0)
MCH: 25.4 pg — ABNORMAL LOW (ref 26.0–34.0)
MCHC: 31.1 g/dL (ref 30.0–36.0)
MCV: 81.8 fL (ref 80.0–100.0)
Platelets: 231 10*3/uL (ref 150–400)
RBC: 3.46 MIL/uL — ABNORMAL LOW (ref 4.22–5.81)
RDW: 20.2 % — ABNORMAL HIGH (ref 11.5–15.5)
WBC: 12.8 10*3/uL — ABNORMAL HIGH (ref 4.0–10.5)
nRBC: 0 % (ref 0.0–0.2)

## 2020-09-09 LAB — RENAL FUNCTION PANEL
Albumin: 1.8 g/dL — ABNORMAL LOW (ref 3.5–5.0)
Anion gap: 13 (ref 5–15)
BUN: 49 mg/dL — ABNORMAL HIGH (ref 6–20)
CO2: 26 mmol/L (ref 22–32)
Calcium: 9.7 mg/dL (ref 8.9–10.3)
Chloride: 94 mmol/L — ABNORMAL LOW (ref 98–111)
Creatinine, Ser: 4.29 mg/dL — ABNORMAL HIGH (ref 0.61–1.24)
GFR, Estimated: 16 mL/min — ABNORMAL LOW (ref >60)
Glucose, Bld: 201 mg/dL — ABNORMAL HIGH (ref 70–99)
Phosphorus: 2.5 mg/dL (ref 2.5–4.6)
Potassium: 4.2 mmol/L (ref 3.5–5.1)
Sodium: 133 mmol/L — ABNORMAL LOW (ref 135–145)

## 2020-09-09 NOTE — Progress Notes (Signed)
[facility] [place]  PROGRESS NOTE     Jeffery Andrews  [id number]  DOB: 06-24-1975   DOA: 07/17/2020  Referring Physician: Satira Sark, MD  HPI: Jeffery Andrews is a 45 y.o. male being followed for ventilator/airway/oxygen weaning Acute on Chronic Respiratory Failure.  Patient is comfortable right now without distress is capping on room air  Medications: Reviewed on Rounds  Physical Exam:  Vitals: Temperature is 97.7 pulse 66 respiratory is 20 blood pressure is 149/82 saturations 100%  Ventilator Settings temperature 97.9 pulse 66 respiratory rate is 20 blood pressure is 149/80 saturations 100%  General: Comfortable at this time Neck: supple Cardiovascular: no malignant arrhythmias Respiratory: Scattered rhonchi expansion is equal Skin: no rash seen on limited exam Musculoskeletal: No gross abnormality Psychiatric:unable to assess Neurologic:no involuntary movements         Lab Data:   Basic Metabolic Panel: Recent Labs  Lab 09/04/20 0421 09/07/20 0714 09/09/20 0601  NA 131* 136 133*  K 3.9 4.5 4.2  CL 94* 98 94*  CO2 '26 26 26  '$ GLUCOSE 116* 127* 201*  BUN 47* 58* 49*  CREATININE 4.53* 5.03* 4.29*  CALCIUM 9.2 9.7 9.7  PHOS 3.0 2.9 2.5    ABG: No results for input(s): PHART, PCO2ART, PO2ART, HCO3, O2SAT in the last 168 hours.  Liver Function Tests: Recent Labs  Lab 09/04/20 0421 09/07/20 0714 09/09/20 0601  ALBUMIN 1.8* 1.7* 1.8*   No results for input(s): LIPASE, AMYLASE in the last 168 hours. No results for input(s): AMMONIA in the last 168 hours.  CBC: Recent Labs  Lab 09/04/20 0421 09/07/20 0714 09/09/20 0601  WBC 19.5* 17.6* 12.8*  HGB 8.6* 8.7* 8.8*  HCT 27.2* 28.3* 28.3*  MCV 81.2 81.8 81.8  PLT 277 275 231    Cardiac Enzymes: No results for input(s): CKTOTAL, CKMB, CKMBINDEX, TROPONINI in the last 168 hours.  BNP (last 3 results) No  results for input(s): BNP in the last 8760 hours.  ProBNP (last 3 results) No results for input(s): PROBNP in the last 8760 hours.  Radiological Exams: No results found.  Assessment/Plan Active Problems:   Acute on chronic respiratory failure with hypoxia (HCC)   End stage renal disease on dialysis (HCC)   Lobar pneumonia, unspecified organism (HCC)   Chronic diastolic CHF (congestive heart failure) (HCC)   Acute on chronic respiratory failure with hypoxia remains on capping doing fine plan is going to be to discharge to nursing facility End-stage renal disease on hemodialysis Lobar pneumonia treated improving Chronic diastolic heart failure compensated Tracheostomy remains in place capping plan on decannulation several days prior to discharge   I have personally seen and evaluated the patient, evaluated laboratory and imaging results, formulated the assessment and plan and placed orders. The Patient requires high complexity decision making with multiple systems involvement.  Rounds were done with the Respiratory Therapy Director and Staff therapists and discussed with nursing staff also.  Allyne Gee, MD [facility] Sleep Medicine

## 2020-09-10 DIAGNOSIS — J181 Lobar pneumonia, unspecified organism: Secondary | ICD-10-CM | POA: Diagnosis not present

## 2020-09-10 DIAGNOSIS — J9621 Acute and chronic respiratory failure with hypoxia: Secondary | ICD-10-CM | POA: Diagnosis not present

## 2020-09-10 DIAGNOSIS — N186 End stage renal disease: Secondary | ICD-10-CM | POA: Diagnosis not present

## 2020-09-10 DIAGNOSIS — I5032 Chronic diastolic (congestive) heart failure: Secondary | ICD-10-CM | POA: Diagnosis not present

## 2020-09-10 NOTE — Progress Notes (Signed)
[facility]  ROUNDING NOTE   Subjective:  Patient underwent hemodialysis treatment yesterday. Next dialysis treatment scheduled for tomorrow. Patient is awake but not following any commands.  Objective:  Vital signs in last 24 hours:  Temperature 90.5 pulse 71 respirations 14 blood pressure 163/93  Physical Exam: General:  No acute distress  Head:  Normocephalic, atraumatic.    Eyes:  Anicteric  Neck:  Tracheostomy in place  Lungs:   Clear to auscultation, normal effort  Heart:  S1S2 no rubs  Abdomen:   Soft, nontender, bowel sounds present  Extremities:  Trace peripheral edema.  Neurologic:  Bilateral hand contractures  Skin:  No acute skin rash  Access:  Left upper extremity AV fistula    Basic Metabolic Panel: Recent Labs  Lab 09/04/20 0421 09/07/20 0714 09/09/20 0601  NA 131* 136 133*  K 3.9 4.5 4.2  CL 94* 98 94*  CO2 '26 26 26  '$ GLUCOSE 116* 127* 201*  BUN 47* 58* 49*  CREATININE 4.53* 5.03* 4.29*  CALCIUM 9.2 9.7 9.7  PHOS 3.0 2.9 2.5     Liver Function Tests: Recent Labs  Lab 09/04/20 0421 09/07/20 0714 09/09/20 0601  ALBUMIN 1.8* 1.7* 1.8*    No results for input(s): LIPASE, AMYLASE in the last 168 hours. No results for input(s): AMMONIA in the last 168 hours.  CBC: Recent Labs  Lab 09/04/20 0421 09/07/20 0714 09/09/20 0601  WBC 19.5* 17.6* 12.8*  HGB 8.6* 8.7* 8.8*  HCT 27.2* 28.3* 28.3*  MCV 81.2 81.8 81.8  PLT 277 275 231     Cardiac Enzymes: No results for input(s): CKTOTAL, CKMB, CKMBINDEX, TROPONINI in the last 168 hours.  BNP: Invalid input(s): POCBNP  CBG: No results for input(s): GLUCAP in the last 168 hours.  Microbiology: Results for orders placed or performed during the hospital encounter of 07/17/20  Culture, blood (routine x 2)     Status: None   Collection Time: 07/27/20 11:17 AM   Specimen: BLOOD RIGHT ARM  Result Value Ref Range Status   Specimen Description BLOOD RIGHT ARM  Final   Special  Requests   Final    BOTTLES DRAWN AEROBIC ONLY Blood Culture results may not be optimal due to an inadequate volume of blood received in culture bottles   Culture   Final    NO GROWTH 5 DAYS Performed at [facility], [place] [address], [place], [place] [address]    Report Status 08/01/2020 FINAL  Final  Culture, blood (routine x 2)     Status: None   Collection Time: 07/27/20 11:23 AM   Specimen: BLOOD RIGHT HAND  Result Value Ref Range Status   Specimen Description BLOOD RIGHT HAND  Final   Special Requests   Final    BOTTLES DRAWN AEROBIC ONLY Blood Culture results may not be optimal due to an inadequate volume of blood received in culture bottles   Culture   Final    NO GROWTH 5 DAYS Performed at [facility], [place] [address] [place], [place] [address]    Report Status 08/01/2020 FINAL  Final  Culture, blood (routine x 2)     Status: None   Collection Time: 08/01/20  2:25 PM   Specimen: BLOOD RIGHT HAND  Result Value Ref Range Status   Specimen Description BLOOD RIGHT HAND  Final   Special Requests   Final    BOTTLES DRAWN AEROBIC AND ANAEROBIC Blood Culture results may not be optimal due to an inadequate  volume of blood received in culture bottles   Culture   Final    NO GROWTH 5 DAYS Performed at [facility], [place] [address] [place], [place] [address]    Report Status 08/06/2020 FINAL  Final  Culture, blood (routine x 2)     Status: None   Collection Time: 08/01/20  2:39 PM   Specimen: BLOOD RIGHT ARM  Result Value Ref Range Status   Specimen Description BLOOD RIGHT ARM  Final   Special Requests   Final    BOTTLES DRAWN AEROBIC ONLY Blood Culture results may not be optimal due to an inadequate volume of blood received in culture bottles   Culture   Final    NO GROWTH 5 DAYS Performed at [facility], [place] [address] [place], [place] [address]    Report Status 08/06/2020 FINAL  Final  Culture, Respiratory w Gram Stain     Status: None    Collection Time: 08/01/20  2:55 PM   Specimen: Tracheal Aspirate; Respiratory  Result Value Ref Range Status   Specimen Description TRACHEAL ASPIRATE  Final   Special Requests NONE  Final   Gram Stain   Final    FEW WBC PRESENT, PREDOMINANTLY PMN MODERATE GRAM NEGATIVE RODS Performed at [facility], [place] [address], [place], [place] [address]    Culture ABUNDANT PSEUDOMONAS AERUGINOSA  Final   Report Status 08/04/2020 FINAL  Final   Organism ID, Bacteria PSEUDOMONAS AERUGINOSA  Final      Susceptibility   Pseudomonas aeruginosa - MIC*    CEFTAZIDIME <=1 SENSITIVE Sensitive     CIPROFLOXACIN <=0.25 SENSITIVE Sensitive     GENTAMICIN 8 INTERMEDIATE Intermediate     IMIPENEM >=16 RESISTANT Resistant     PIP/TAZO <=4 SENSITIVE Sensitive     CEFEPIME 2 SENSITIVE Sensitive     * ABUNDANT PSEUDOMONAS AERUGINOSA    Coagulation Studies: No results for input(s): LABPROT, INR in the last 72 hours.   Urinalysis: No results for input(s): COLORURINE, LABSPEC, PHURINE, GLUCOSEU, HGBUR, BILIRUBINUR, KETONESUR, PROTEINUR, UROBILINOGEN, NITRITE, LEUKOCYTESUR in the last 72 hours.  Invalid input(s): APPERANCEUR    Imaging: No results found.   Medications:       Assessment/ Plan:  45 y.o. male with a PMHx of ESRD on HD, posterior reversible encephalopathy syndrome, diabetes mellitus type 2, chronic systolic heart failure, anemia of chronic kidney disease, secondary hyperparathyroidism, malignant hypertension, acute respiratory failure, who was admitted to [facility] on 06/29/2020 for ongoing treatment of acute respiratory failure, posterior reversible encephalopathy syndrome, and end-stage renal disease.   1.  ESRD on HD.  Patient underwent hemodialysis yesterday.  No acute indication for dialysis today.  We will plan for hemodialysis treatment again tomorrow.  2.  Anemia of chronic kidney disease.  Hemoglobin currently 8.8.  Maintain the patient on Retacrit 6000 IV with  dialysis.  3.  Secondary hyperparathyroidism.  Phosphorus at target at 2.5.  4.  Acute respiratory failure.  Tracheostomy placed 07/17/2020.  Continues to tolerate capped tracheostomy at the moment.  5.  Hypertension.  Maintain the patient on amlodipine, clonidine, hydralazine, losartan.  Blood pressure currently 163/93.    LOS: 0 Kaari Zeigler 9/8/202210:36 AM

## 2020-09-10 NOTE — Progress Notes (Signed)
[facility] [place]  PROGRESS NOTE     Jeffery Andrews  [id number]  DOB: 1975/12/07   DOA: 07/17/2020  Referring Physician: Satira Sark, MD  HPI: Jeffery Andrews is a 45 y.o. male being followed for ventilator/airway/oxygen weaning Acute on Chronic Respiratory Failure.  Patient is comfortable right now without distress has been off of mechanical support  Medications: Reviewed on Rounds  Physical Exam:  Vitals: Temperature is 98.5 pulse 71 respiratory is 14 blood pressure is 163/93 saturations 100%  Ventilator Settings capping of the ventilator  General: Comfortable at this time Neck: supple Cardiovascular: no malignant arrhythmias Respiratory: No rhonchi very coarse breath sounds Skin: no rash seen on limited exam Musculoskeletal: No gross abnormality Psychiatric:unable to assess Neurologic:no involuntary movements         Lab Data:   Basic Metabolic Panel: Recent Labs  Lab 09/04/20 0421 09/07/20 0714 09/09/20 0601  NA 131* 136 133*  K 3.9 4.5 4.2  CL 94* 98 94*  CO2 '26 26 26  '$ GLUCOSE 116* 127* 201*  BUN 47* 58* 49*  CREATININE 4.53* 5.03* 4.29*  CALCIUM 9.2 9.7 9.7  PHOS 3.0 2.9 2.5    ABG: No results for input(s): PHART, PCO2ART, PO2ART, HCO3, O2SAT in the last 168 hours.  Liver Function Tests: Recent Labs  Lab 09/04/20 0421 09/07/20 0714 09/09/20 0601  ALBUMIN 1.8* 1.7* 1.8*   No results for input(s): LIPASE, AMYLASE in the last 168 hours. No results for input(s): AMMONIA in the last 168 hours.  CBC: Recent Labs  Lab 09/04/20 0421 09/07/20 0714 09/09/20 0601  WBC 19.5* 17.6* 12.8*  HGB 8.6* 8.7* 8.8*  HCT 27.2* 28.3* 28.3*  MCV 81.2 81.8 81.8  PLT 277 275 231    Cardiac Enzymes: No results for input(s): CKTOTAL, CKMB, CKMBINDEX, TROPONINI in the last 168 hours.  BNP (last 3 results) No results for input(s): BNP in the last 8760  hours.  ProBNP (last 3 results) No results for input(s): PROBNP in the last 8760 hours.  Radiological Exams: No results found.  Assessment/Plan Active Problems:   Acute on chronic respiratory failure with hypoxia (HCC)   End stage renal disease on dialysis (HCC)   Lobar pneumonia, unspecified organism (HCC)   Chronic diastolic CHF (congestive heart failure) (HCC)   Acute on chronic respiratory failure hypoxia plan is going to be to continue with the capping trials.  Continue secretion management pulmonary toilet. End-stage renal disease patient is on hemodialysis For pneumonia treated clinically is improving. Chronic diastolic heart failure compensated we will continue to follow along closely Tracheostomy remains in place and is capping   I have personally seen and evaluated the patient, evaluated laboratory and imaging results, formulated the assessment and plan and placed orders. The Patient requires high complexity decision making with multiple systems involvement.  Rounds were done with the Respiratory Therapy Director and Staff therapists and discussed with nursing staff also.  Allyne Gee, MD [facility] Sleep Medicine

## 2020-09-11 ENCOUNTER — Other Ambulatory Visit (HOSPITAL_COMMUNITY): Payer: Medicare Other

## 2020-09-11 DIAGNOSIS — I5032 Chronic diastolic (congestive) heart failure: Secondary | ICD-10-CM | POA: Diagnosis not present

## 2020-09-11 DIAGNOSIS — N186 End stage renal disease: Secondary | ICD-10-CM | POA: Diagnosis not present

## 2020-09-11 DIAGNOSIS — J181 Lobar pneumonia, unspecified organism: Secondary | ICD-10-CM | POA: Diagnosis not present

## 2020-09-11 DIAGNOSIS — J9621 Acute and chronic respiratory failure with hypoxia: Secondary | ICD-10-CM | POA: Diagnosis not present

## 2020-09-11 LAB — CBC
HCT: 29.3 % — ABNORMAL LOW (ref 39.0–52.0)
Hemoglobin: 8.9 g/dL — ABNORMAL LOW (ref 13.0–17.0)
MCH: 25.1 pg — ABNORMAL LOW (ref 26.0–34.0)
MCHC: 30.4 g/dL (ref 30.0–36.0)
MCV: 82.8 fL (ref 80.0–100.0)
Platelets: 224 10*3/uL (ref 150–400)
RBC: 3.54 MIL/uL — ABNORMAL LOW (ref 4.22–5.81)
RDW: 19.9 % — ABNORMAL HIGH (ref 11.5–15.5)
WBC: 14.9 10*3/uL — ABNORMAL HIGH (ref 4.0–10.5)
nRBC: 0 % (ref 0.0–0.2)

## 2020-09-11 LAB — RENAL FUNCTION PANEL
Albumin: 1.9 g/dL — ABNORMAL LOW (ref 3.5–5.0)
Anion gap: 15 (ref 5–15)
BUN: 60 mg/dL — ABNORMAL HIGH (ref 6–20)
CO2: 27 mmol/L (ref 22–32)
Calcium: 10.4 mg/dL — ABNORMAL HIGH (ref 8.9–10.3)
Chloride: 94 mmol/L — ABNORMAL LOW (ref 98–111)
Creatinine, Ser: 4.03 mg/dL — ABNORMAL HIGH (ref 0.61–1.24)
GFR, Estimated: 18 mL/min — ABNORMAL LOW (ref >60)
Glucose, Bld: 166 mg/dL — ABNORMAL HIGH (ref 70–99)
Phosphorus: 2.8 mg/dL (ref 2.5–4.6)
Potassium: 4.7 mmol/L (ref 3.5–5.1)
Sodium: 136 mmol/L (ref 135–145)

## 2020-09-11 NOTE — Progress Notes (Signed)
Pulmonary [place]  PROGRESS NOTE     Jeffery Andrews  [id number]  DOB: [birth year]   DOA: 07/17/2020  Referring Physician: Satira Sark, MD  HPI: Jeffery Andrews is a 45 y.o. male being followed for ventilator/airway/oxygen weaning Acute on Chronic Respiratory Failure.  Patient is capping on room air no change  Medications: Reviewed on Rounds  Physical Exam:  Vitals: Temperature is 96.6 pulse 66 respiratory rate is 12 blood pressure is 164/98 saturations 94%  Ventilator Settings capping on room air  General: Comfortable at this time Neck: supple Cardiovascular: no malignant arrhythmias Respiratory: No rhonchi no rales are noted at this time Skin: no rash seen on limited exam Musculoskeletal: No gross abnormality Psychiatric:unable to assess Neurologic:no involuntary movements         Lab Data:   Basic Metabolic Panel: Recent Labs  Lab 09/07/20 0714 09/09/20 0601 09/11/20 0451  NA 136 133* 136  K 4.5 4.2 4.7  CL 98 94* 94*  CO2 '26 26 27  '$ GLUCOSE 127* 201* 166*  BUN 58* 49* 60*  CREATININE 5.03* 4.29* 4.03*  CALCIUM 9.7 9.7 10.4*  PHOS 2.9 2.5 2.8    ABG: No results for input(s): PHART, PCO2ART, PO2ART, HCO3, O2SAT in the last 168 hours.  Liver Function Tests: Recent Labs  Lab 09/07/20 0714 09/09/20 0601 09/11/20 0451  ALBUMIN 1.7* 1.8* 1.9*   No results for input(s): LIPASE, AMYLASE in the last 168 hours. No results for input(s): AMMONIA in the last 168 hours.  CBC: Recent Labs  Lab 09/07/20 0714 09/09/20 0601 09/11/20 0451  WBC 17.6* 12.8* 14.9*  HGB 8.7* 8.8* 8.9*  HCT 28.3* 28.3* 29.3*  MCV 81.8 81.8 82.8  PLT 275 231 224    Cardiac Enzymes: No results for input(s): CKTOTAL, CKMB, CKMBINDEX, TROPONINI in the last 168 hours.  BNP (last 3 results) No results for input(s): BNP in the last 8760 hours.  ProBNP (last 3 results) No results for  input(s): PROBNP in the last 8760 hours.  Radiological Exams: No results found.  Assessment/Plan Active Problems:   Acute on chronic respiratory failure with hypoxia (HCC)   End stage renal disease on dialysis (HCC)   Lobar pneumonia, unspecified organism (HCC)   Chronic diastolic CHF (congestive heart failure) (HCC)   Acute on chronic respiratory failure hypoxia we will continue with the capping trials doing well so far awaiting discharge plan End-stage renal failure on hemodialysis Lobar pneumonia treated we will continue to follow along Chronic diastolic heart failure Tracheostomy remains in   I have personally seen and evaluated the patient, evaluated laboratory and imaging results, formulated the assessment and plan and placed orders. The Patient requires high complexity decision making with multiple systems involvement.  Rounds were done with the Respiratory Therapy Director and Staff therapists and discussed with nursing staff also.  Allyne Gee, MD [facility] Pulmonary Critical Care Medicine Sleep Medicine

## 2020-09-11 NOTE — Progress Notes (Signed)
[facility]  ROUNDING NOTE   Subjective:  Patient sitting up in chair this AM. Tracheostomy remains capped. Remains on MWF dialysis schedule. Due for dialysis today.  Objective:  Vital signs in last 24 hours:  Temperature 96.6 pulse 66 respirations 10 blood pressure 164/88  Physical Exam: General:  No acute distress  Head:  Normocephalic, atraumatic.    Eyes:  Anicteric  Neck:  Tracheostomy in place  Lungs:   Clear to auscultation, normal effort  Heart:  S1S2 no rubs  Abdomen:   Soft, nontender, bowel sounds present  Extremities:  Trace peripheral edema.  Neurologic:  Bilateral hand contractures  Skin:  No acute skin rash  Access:  Left upper extremity AV fistula    Basic Metabolic Panel: Recent Labs  Lab 09/07/20 0714 09/09/20 0601 09/11/20 0451  NA 136 133* 136  K 4.5 4.2 4.7  CL 98 94* 94*  CO2 '26 26 27  '$ GLUCOSE 127* 201* 166*  BUN 58* 49* 60*  CREATININE 5.03* 4.29* 4.03*  CALCIUM 9.7 9.7 10.4*  PHOS 2.9 2.5 2.8     Liver Function Tests: Recent Labs  Lab 09/07/20 0714 09/09/20 0601 09/11/20 0451  ALBUMIN 1.7* 1.8* 1.9*    No results for input(s): LIPASE, AMYLASE in the last 168 hours. No results for input(s): AMMONIA in the last 168 hours.  CBC: Recent Labs  Lab 09/07/20 0714 09/09/20 0601 09/11/20 0451  WBC 17.6* 12.8* 14.9*  HGB 8.7* 8.8* 8.9*  HCT 28.3* 28.3* 29.3*  MCV 81.8 81.8 82.8  PLT 275 231 224     Cardiac Enzymes: No results for input(s): CKTOTAL, CKMB, CKMBINDEX, TROPONINI in the last 168 hours.  BNP: Invalid input(s): POCBNP  CBG: No results for input(s): GLUCAP in the last 168 hours.  Microbiology: Results for orders placed or performed during the hospital encounter of 07/17/20  Culture, blood (routine x 2)     Status: None   Collection Time: 07/27/20 11:17 AM   Specimen: BLOOD RIGHT ARM  Result Value Ref Range Status   Specimen Description BLOOD RIGHT ARM  Final   Special Requests   Final     BOTTLES DRAWN AEROBIC ONLY Blood Culture results may not be optimal due to an inadequate volume of blood received in culture bottles   Culture   Final    NO GROWTH 5 DAYS Performed at [facility], [place] [address] [place], [place] [address]    Report Status 08/01/2020 FINAL  Final  Culture, blood (routine x 2)     Status: None   Collection Time: 07/27/20 11:23 AM   Specimen: BLOOD RIGHT HAND  Result Value Ref Range Status   Specimen Description BLOOD RIGHT HAND  Final   Special Requests   Final    BOTTLES DRAWN AEROBIC ONLY Blood Culture results may not be optimal due to an inadequate volume of blood received in culture bottles   Culture   Final    NO GROWTH 5 DAYS Performed at [facility], [place] [address], [place], [place] [address]    Report Status 08/01/2020 FINAL  Final  Culture, blood (routine x 2)     Status: None   Collection Time: 08/01/20  2:25 PM   Specimen: BLOOD RIGHT HAND  Result Value Ref Range Status   Specimen Description BLOOD RIGHT HAND  Final   Special Requests   Final    BOTTLES DRAWN AEROBIC AND ANAEROBIC Blood Culture results may not be optimal due to an inadequate  volume of blood received in culture bottles   Culture   Final    NO GROWTH 5 DAYS Performed at [facility], [place] [address] [place], [place] [address]    Report Status 08/06/2020 FINAL  Final  Culture, blood (routine x 2)     Status: None   Collection Time: 08/01/20  2:39 PM   Specimen: BLOOD RIGHT ARM  Result Value Ref Range Status   Specimen Description BLOOD RIGHT ARM  Final   Special Requests   Final    BOTTLES DRAWN AEROBIC ONLY Blood Culture results may not be optimal due to an inadequate volume of blood received in culture bottles   Culture   Final    NO GROWTH 5 DAYS Performed at [facility], [place] [address], [place], [place] [address]    Report Status 08/06/2020 FINAL  Final  Culture, Respiratory w Gram Stain     Status: None   Collection Time: 08/01/20   2:55 PM   Specimen: Tracheal Aspirate; Respiratory  Result Value Ref Range Status   Specimen Description TRACHEAL ASPIRATE  Final   Special Requests NONE  Final   Gram Stain   Final    FEW WBC PRESENT, PREDOMINANTLY PMN MODERATE GRAM NEGATIVE RODS Performed at [facility], [place] [address], [place],  [address]    Culture ABUNDANT PSEUDOMONAS AERUGINOSA  Final   Report Status 08/04/2020 FINAL  Final   Organism ID, Bacteria PSEUDOMONAS AERUGINOSA  Final      Susceptibility   Pseudomonas aeruginosa - MIC*    CEFTAZIDIME <=1 SENSITIVE Sensitive     CIPROFLOXACIN <=0.25 SENSITIVE Sensitive     GENTAMICIN 8 INTERMEDIATE Intermediate     IMIPENEM >=16 RESISTANT Resistant     PIP/TAZO <=4 SENSITIVE Sensitive     CEFEPIME 2 SENSITIVE Sensitive     * ABUNDANT PSEUDOMONAS AERUGINOSA    Coagulation Studies: No results for input(s): LABPROT, INR in the last 72 hours.   Urinalysis: No results for input(s): COLORURINE, LABSPEC, PHURINE, GLUCOSEU, HGBUR, BILIRUBINUR, KETONESUR, PROTEINUR, UROBILINOGEN, NITRITE, LEUKOCYTESUR in the last 72 hours.  Invalid input(s): APPERANCEUR    Imaging: No results found.   Medications:       Assessment/ Plan:  45 y.o. male with a PMHx of ESRD on HD, posterior reversible encephalopathy syndrome, diabetes mellitus type 2, chronic systolic heart failure, anemia of chronic kidney disease, secondary hyperparathyroidism, malignant hypertension, acute respiratory failure, who was admitted to [facility] on 06/29/2020 for ongoing treatment of acute respiratory failure, posterior reversible encephalopathy syndrome, and end-stage renal disease.   1.  ESRD on HD.  Patient due for hemodialysis treatment today per MWF schedule.  2.  Anemia of chronic kidney disease.  Hemoglobin up slightly to 8.9.  Maintain the patient on Retacrit 6000 units IV with dialysis treatments.  3.  Secondary hyperparathyroidism.  Phosphorus acceptable at 2.8.   Continue periodically monitor.  4.  Acute respiratory failure.  Tracheostomy placed 07/17/2020.  Tracheostomy remains capped and respiratory status appears to be stable.  5.  Hypertension.  Maintain the patient on amlodipine, clonidine, hydralazine, losartan.  Blood pressure this a.m. is 164/88 but has been as low as 144/81 over the preceding 24 hours.    LOS: 0 Abdalla Naramore 9/9/20228:01 AM

## 2020-09-12 DIAGNOSIS — N186 End stage renal disease: Secondary | ICD-10-CM | POA: Diagnosis not present

## 2020-09-12 DIAGNOSIS — J9621 Acute and chronic respiratory failure with hypoxia: Secondary | ICD-10-CM | POA: Diagnosis not present

## 2020-09-12 DIAGNOSIS — J181 Lobar pneumonia, unspecified organism: Secondary | ICD-10-CM | POA: Diagnosis not present

## 2020-09-12 DIAGNOSIS — I5032 Chronic diastolic (congestive) heart failure: Secondary | ICD-10-CM | POA: Diagnosis not present

## 2020-09-12 NOTE — Progress Notes (Signed)
Pulmonary [place]  PROGRESS NOTE     Jeffery Andrews  [id number]  DOB: [birth year]   DOA: 07/17/2020  Referring Physician: Satira Sark, MD  HPI: Jeffery Andrews is a 45 y.o. male being followed for ventilator/airway/oxygen weaning Acute on Chronic Respiratory Failure.  Patient is resting comfortably right now without distress at this time he has been on room air he still is doing fine with capping  Medications: Reviewed on Rounds  Physical Exam:  Vitals: Temperature is 97.1 pulse 67 respiratory rate is 12 blood pressure is 152/92 saturations 97%  Ventilator Settings capping on room air  General: Comfortable at this time Neck: supple Cardiovascular: no malignant arrhythmias Respiratory: No rhonchi very coarse breath sounds Skin: no rash seen on limited exam Musculoskeletal: No gross abnormality Psychiatric:unable to assess Neurologic:no involuntary movements         Lab Data:   Basic Metabolic Panel: Recent Labs  Lab 09/07/20 0714 09/09/20 0601 09/11/20 0451  NA 136 133* 136  K 4.5 4.2 4.7  CL 98 94* 94*  CO2 '26 26 27  '$ GLUCOSE 127* 201* 166*  BUN 58* 49* 60*  CREATININE 5.03* 4.29* 4.03*  CALCIUM 9.7 9.7 10.4*  PHOS 2.9 2.5 2.8    ABG: No results for input(s): PHART, PCO2ART, PO2ART, HCO3, O2SAT in the last 168 hours.  Liver Function Tests: Recent Labs  Lab 09/07/20 0714 09/09/20 0601 09/11/20 0451  ALBUMIN 1.7* 1.8* 1.9*   No results for input(s): LIPASE, AMYLASE in the last 168 hours. No results for input(s): AMMONIA in the last 168 hours.  CBC: Recent Labs  Lab 09/07/20 0714 09/09/20 0601 09/11/20 0451  WBC 17.6* 12.8* 14.9*  HGB 8.7* 8.8* 8.9*  HCT 28.3* 28.3* 29.3*  MCV 81.8 81.8 82.8  PLT 275 231 224    Cardiac Enzymes: No results for input(s): CKTOTAL, CKMB, CKMBINDEX, TROPONINI in the last 168 hours.  BNP (last 3 results) No results  for input(s): BNP in the last 8760 hours.  ProBNP (last 3 results) No results for input(s): PROBNP in the last 8760 hours.  Radiological Exams: DG Chest Port 1 View  Result Date: 09/11/2020 CLINICAL DATA:  Cough EXAM: PORTABLE CHEST 1 VIEW COMPARISON:  08/31/2020 FINDINGS: Tracheostomy tube remains in place. Stable cardiomegaly. Streaky bibasilar opacities, which appear minimally progressed from prior. Probable small right pleural effusion. No pneumothorax. Probable skin fold projects over the mid chest, just left of midline. IMPRESSION: Streaky bibasilar opacities, minimally progressed from prior. Probable small right pleural effusion. Electronically Signed   By: Davina Poke D.O.   On: 09/11/2020 14:46    Assessment/Plan Active Problems:   Acute on chronic respiratory failure with hypoxia (HCC)   End stage renal disease on dialysis (HCC)   Lobar pneumonia, unspecified organism (HCC)   Chronic diastolic CHF (congestive heart failure) (HCC)   Acute on chronic respiratory failure hypoxia we will continue with the capping trials.  Titrate oxygen as tolerated continue pulmonary toilet. End-stage renal failure on hemodialysis Lobar pneumonia treated clinically is improving Chronic diastolic heart failure compensated at this time. Tracheostomy remains in place.   I have personally seen and evaluated the patient, evaluated laboratory and imaging results, formulated the assessment and plan and placed orders. The Patient requires high complexity decision making with multiple systems involvement.  Rounds were done with the Respiratory Therapy Director and Staff therapists and discussed with nursing staff also.  Allyne Gee, MD [facility]  Pulmonary Critical Care Medicine Sleep Medicine

## 2020-09-13 DIAGNOSIS — N186 End stage renal disease: Secondary | ICD-10-CM | POA: Diagnosis not present

## 2020-09-13 DIAGNOSIS — J9621 Acute and chronic respiratory failure with hypoxia: Secondary | ICD-10-CM | POA: Diagnosis not present

## 2020-09-13 DIAGNOSIS — J181 Lobar pneumonia, unspecified organism: Secondary | ICD-10-CM | POA: Diagnosis not present

## 2020-09-13 DIAGNOSIS — I5032 Chronic diastolic (congestive) heart failure: Secondary | ICD-10-CM | POA: Diagnosis not present

## 2020-09-13 NOTE — Progress Notes (Signed)
Pulmonary [place]  PROGRESS NOTE     MATAO UNDERDOWN  [id number]  DOB: 02-Nov-1975   DOA: 07/17/2020  Referring Physician: Satira Sark, MD  HPI: TAVARE FOLAN is a 45 y.o. male being followed for ventilator/airway/oxygen weaning Acute on Chronic Respiratory Failure.  Patient currently is capping doing fairly well remains on room air  Medications: Reviewed on Rounds  Physical Exam:  Vitals: Temperature is 99.6 pulse of 76 respiratory rate is 16 blood pressure 186/94 saturations 100%  Ventilator Settings capping off the ventilator right now  General: Comfortable at this time Neck: supple Cardiovascular: no malignant arrhythmias Respiratory: No rhonchi very coarse breath sounds Skin: no rash seen on limited exam Musculoskeletal: No gross abnormality Psychiatric:unable to assess Neurologic:no involuntary movements         Lab Data:   Basic Metabolic Panel: Recent Labs  Lab 09/07/20 0714 09/09/20 0601 09/11/20 0451  NA 136 133* 136  K 4.5 4.2 4.7  CL 98 94* 94*  CO2 '26 26 27  '$ GLUCOSE 127* 201* 166*  BUN 58* 49* 60*  CREATININE 5.03* 4.29* 4.03*  CALCIUM 9.7 9.7 10.4*  PHOS 2.9 2.5 2.8    ABG: No results for input(s): PHART, PCO2ART, PO2ART, HCO3, O2SAT in the last 168 hours.  Liver Function Tests: Recent Labs  Lab 09/07/20 0714 09/09/20 0601 09/11/20 0451  ALBUMIN 1.7* 1.8* 1.9*   No results for input(s): LIPASE, AMYLASE in the last 168 hours. No results for input(s): AMMONIA in the last 168 hours.  CBC: Recent Labs  Lab 09/07/20 0714 09/09/20 0601 09/11/20 0451  WBC 17.6* 12.8* 14.9*  HGB 8.7* 8.8* 8.9*  HCT 28.3* 28.3* 29.3*  MCV 81.8 81.8 82.8  PLT 275 231 224    Cardiac Enzymes: No results for input(s): CKTOTAL, CKMB, CKMBINDEX, TROPONINI in the last 168 hours.  BNP (last 3 results) No results for input(s): BNP in the last 8760  hours.  ProBNP (last 3 results) No results for input(s): PROBNP in the last 8760 hours.  Radiological Exams: No results found.  Assessment/Plan Active Problems:   Acute on chronic respiratory failure with hypoxia (HCC)   End stage renal disease on dialysis (HCC)   Lobar pneumonia, unspecified organism (HCC)   Chronic diastolic CHF (congestive heart failure) (HCC)   Acute on chronic respiratory failure with hypoxia continues to do fine with capping perhaps next week we will look towards the possibility of decannulation and End-stage renal disease has been on hemodialysis being seen by nephrology Lobar pneumonia treated we will continue to follow along closely Chronic diastolic heart failure compensated Tracheostomy remains in place   I have personally seen and evaluated the patient, evaluated laboratory and imaging results, formulated the assessment and plan and placed orders. The Patient requires high complexity decision making with multiple systems involvement.  Rounds were done with the Respiratory Therapy Director and Staff therapists and discussed with nursing staff also.  Allyne Gee, MD [facility] Pulmonary Critical Care Medicine Sleep Medicine

## 2020-09-14 ENCOUNTER — Other Ambulatory Visit (HOSPITAL_COMMUNITY): Payer: Medicare Other

## 2020-09-14 DIAGNOSIS — J9621 Acute and chronic respiratory failure with hypoxia: Secondary | ICD-10-CM | POA: Diagnosis not present

## 2020-09-14 DIAGNOSIS — I5032 Chronic diastolic (congestive) heart failure: Secondary | ICD-10-CM | POA: Diagnosis not present

## 2020-09-14 DIAGNOSIS — N186 End stage renal disease: Secondary | ICD-10-CM | POA: Diagnosis not present

## 2020-09-14 DIAGNOSIS — J181 Lobar pneumonia, unspecified organism: Secondary | ICD-10-CM | POA: Diagnosis not present

## 2020-09-14 LAB — RENAL FUNCTION PANEL
Albumin: 1.8 g/dL — ABNORMAL LOW (ref 3.5–5.0)
Anion gap: 14 (ref 5–15)
BUN: 91 mg/dL — ABNORMAL HIGH (ref 6–20)
CO2: 26 mmol/L (ref 22–32)
Calcium: 9.9 mg/dL (ref 8.9–10.3)
Chloride: 92 mmol/L — ABNORMAL LOW (ref 98–111)
Creatinine, Ser: 5.15 mg/dL — ABNORMAL HIGH (ref 0.61–1.24)
GFR, Estimated: 13 mL/min — ABNORMAL LOW (ref >60)
Glucose, Bld: 159 mg/dL — ABNORMAL HIGH (ref 70–99)
Phosphorus: 3.5 mg/dL (ref 2.5–4.6)
Potassium: 4.8 mmol/L (ref 3.5–5.1)
Sodium: 132 mmol/L — ABNORMAL LOW (ref 135–145)

## 2020-09-14 LAB — CBC
HCT: 28.3 % — ABNORMAL LOW (ref 39.0–52.0)
Hemoglobin: 8.9 g/dL — ABNORMAL LOW (ref 13.0–17.0)
MCH: 25.5 pg — ABNORMAL LOW (ref 26.0–34.0)
MCHC: 31.4 g/dL (ref 30.0–36.0)
MCV: 81.1 fL (ref 80.0–100.0)
Platelets: 213 10*3/uL (ref 150–400)
RBC: 3.49 MIL/uL — ABNORMAL LOW (ref 4.22–5.81)
RDW: 20.3 % — ABNORMAL HIGH (ref 11.5–15.5)
WBC: 21.2 10*3/uL — ABNORMAL HIGH (ref 4.0–10.5)
nRBC: 0 % (ref 0.0–0.2)

## 2020-09-14 LAB — HEPATITIS B SURFACE ANTIGEN: Hepatitis B Surface Ag: NONREACTIVE

## 2020-09-14 NOTE — Progress Notes (Signed)
[facility]  ROUNDING NOTE   Subjective:  Patient seen and evaluated at bedside. Sitting up in a chair currently. Due for dialysis treatment today.  Objective:  Vital signs in last 24 hours:  Temperature 98.7 pulse 90 respirations 23 blood pressure 152/73  Physical Exam: General:  No acute distress  Head:  Normocephalic, atraumatic.    Eyes:  Anicteric  Neck:  Tracheostomy in place  Lungs:   Clear to auscultation, normal effort  Heart:  S1S2 no rubs  Abdomen:   Soft, nontender, bowel sounds present  Extremities:  Trace peripheral edema.  Neurologic:  Bilateral hand contractures  Skin:  No acute skin rash  Access:  Left upper extremity AV fistula    Basic Metabolic Panel: Recent Labs  Lab 09/09/20 0601 09/11/20 0451  NA 133* 136  K 4.2 4.7  CL 94* 94*  CO2 26 27  GLUCOSE 201* 166*  BUN 49* 60*  CREATININE 4.29* 4.03*  CALCIUM 9.7 10.4*  PHOS 2.5 2.8     Liver Function Tests: Recent Labs  Lab 09/09/20 0601 09/11/20 0451  ALBUMIN 1.8* 1.9*    No results for input(s): LIPASE, AMYLASE in the last 168 hours. No results for input(s): AMMONIA in the last 168 hours.  CBC: Recent Labs  Lab 09/09/20 0601 09/11/20 0451  WBC 12.8* 14.9*  HGB 8.8* 8.9*  HCT 28.3* 29.3*  MCV 81.8 82.8  PLT 231 224     Cardiac Enzymes: No results for input(s): CKTOTAL, CKMB, CKMBINDEX, TROPONINI in the last 168 hours.  BNP: Invalid input(s): POCBNP  CBG: No results for input(s): GLUCAP in the last 168 hours.  Microbiology: Results for orders placed or performed during the hospital encounter of 07/17/20  Culture, blood (routine x 2)     Status: None   Collection Time: 07/27/20 11:17 AM   Specimen: BLOOD RIGHT ARM  Result Value Ref Range Status   Specimen Description BLOOD RIGHT ARM  Final   Special Requests   Final    BOTTLES DRAWN AEROBIC ONLY Blood Culture results may not be optimal due to an inadequate volume of blood received in culture bottles    Culture   Final    NO GROWTH 5 DAYS Performed at [facility], [place] [address] [place], [place] [address]    Report Status 08/01/2020 FINAL  Final  Culture, blood (routine x 2)     Status: None   Collection Time: 07/27/20 11:23 AM   Specimen: BLOOD RIGHT HAND  Result Value Ref Range Status   Specimen Description BLOOD RIGHT HAND  Final   Special Requests   Final    BOTTLES DRAWN AEROBIC ONLY Blood Culture results may not be optimal due to an inadequate volume of blood received in culture bottles   Culture   Final    NO GROWTH 5 DAYS Performed at [facility], [place] [address], [place], [place] [address]    Report Status 08/01/2020 FINAL  Final  Culture, blood (routine x 2)     Status: None   Collection Time: 08/01/20  2:25 PM   Specimen: BLOOD RIGHT HAND  Result Value Ref Range Status   Specimen Description BLOOD RIGHT HAND  Final   Special Requests   Final    BOTTLES DRAWN AEROBIC AND ANAEROBIC Blood Culture results may not be optimal due to an inadequate volume of blood received in culture bottles   Culture   Final    NO GROWTH 5 DAYS Performed at [facility], [place] [address] [place], [place] [address]    Report Status 08/06/2020 FINAL  Final  Culture, blood (routine x 2)     Status: None   Collection Time: 08/01/20  2:39 PM   Specimen: BLOOD RIGHT ARM  Result Value Ref Range Status   Specimen Description BLOOD RIGHT ARM  Final   Special Requests   Final    BOTTLES DRAWN AEROBIC ONLY Blood Culture results may not be optimal due to an inadequate volume of blood received in culture bottles   Culture   Final    NO GROWTH 5 DAYS Performed at [facility], [place] [address] [place], [place] [address]    Report Status 08/06/2020 FINAL  Final  Culture, Respiratory w Gram Stain     Status: None   Collection Time: 08/01/20  2:55 PM   Specimen: Tracheal Aspirate; Respiratory  Result Value Ref Range Status   Specimen Description TRACHEAL ASPIRATE  Final    Special Requests NONE  Final   Gram Stain   Final    FEW WBC PRESENT, PREDOMINANTLY PMN MODERATE GRAM NEGATIVE RODS Performed at [facility], [place] [address], [place], [place] [address]    Culture ABUNDANT PSEUDOMONAS AERUGINOSA  Final   Report Status 08/04/2020 FINAL  Final   Organism ID, Bacteria PSEUDOMONAS AERUGINOSA  Final      Susceptibility   Pseudomonas aeruginosa - MIC*    CEFTAZIDIME <=1 SENSITIVE Sensitive     CIPROFLOXACIN <=0.25 SENSITIVE Sensitive     GENTAMICIN 8 INTERMEDIATE Intermediate     IMIPENEM >=16 RESISTANT Resistant     PIP/TAZO <=4 SENSITIVE Sensitive     CEFEPIME 2 SENSITIVE Sensitive     * ABUNDANT PSEUDOMONAS AERUGINOSA    Coagulation Studies: No results for input(s): LABPROT, INR in the last 72 hours.   Urinalysis: No results for input(s): COLORURINE, LABSPEC, PHURINE, GLUCOSEU, HGBUR, BILIRUBINUR, KETONESUR, PROTEINUR, UROBILINOGEN, NITRITE, LEUKOCYTESUR in the last 72 hours.  Invalid input(s): APPERANCEUR    Imaging: No results found.   Medications:       Assessment/ Plan:  45 y.o. male with a PMHx of ESRD on HD, posterior reversible encephalopathy syndrome, diabetes mellitus type 2, chronic systolic heart failure, anemia of chronic kidney disease, secondary hyperparathyroidism, malignant hypertension, acute respiratory failure, who was admitted to [facility] on 06/29/2020 for ongoing treatment of acute respiratory failure, posterior reversible encephalopathy syndrome, and end-stage renal disease.   1.  ESRD on HD.  We will maintain the patient on MWF dialysis schedule and therefore he will undergo dialysis treatment today.  2.  Anemia of chronic kidney disease.  Maintain the patient on Retacrit 6000 units IV with dialysis treatments.  3.  Secondary hyperparathyroidism.  Phosphorus currently 2.8 and acceptable.  4.  Acute respiratory failure.  Tracheostomy placed 07/17/2020.  Tracheostomy remains capped and respiratory status  appears to be stable.  5.  Hypertension.  Maintain the patient on amlodipine, clonidine, hydralazine, losartan.  Blood pressure currently 152/73.    LOS: 0 Lucella Pommier 9/12/20228:05 AM

## 2020-09-14 NOTE — Progress Notes (Signed)
P[facility] [name]  PROGRESS NOTE     Jeffery Andrews  [id number]  DOB: Nov 22, 1975   DOA: 07/17/2020  Referring Physician: Satira Sark, MD  HPI: Jeffery Andrews is a 45 y.o. male being followed for ventilator/airway/oxygen weaning Acute on Chronic Respiratory Failure.  Patient is capping doing very well he has had no secretions.  Medications: Reviewed on Rounds  Physical Exam:  Vitals: Temperature is 98.7 pulse 70 respiratory is 23 blood pressure is 152/73 saturations 99%  Ventilator Settings capping of the ventilator  General: Comfortable at this time Neck: supple Cardiovascular: no malignant arrhythmias Respiratory: No rhonchi no rales are noted Skin: no rash seen on limited exam Musculoskeletal: No gross abnormality Psychiatric:unable to assess Neurologic:no involuntary movements         Lab Data:   Basic Metabolic Panel: Recent Labs  Lab 09/09/20 0601 09/11/20 0451 09/14/20 0730  NA 133* 136 132*  K 4.2 4.7 4.8  CL 94* 94* 92*  CO2 '26 27 26  '$ GLUCOSE 201* 166* 159*  BUN 49* 60* 91*  CREATININE 4.29* 4.03* 5.15*  CALCIUM 9.7 10.4* 9.9  PHOS 2.5 2.8 3.5    ABG: No results for input(s): PHART, PCO2ART, PO2ART, HCO3, O2SAT in the last 168 hours.  Liver Function Tests: Recent Labs  Lab 09/09/20 0601 09/11/20 0451 09/14/20 0730  ALBUMIN 1.8* 1.9* 1.8*   No results for input(s): LIPASE, AMYLASE in the last 168 hours. No results for input(s): AMMONIA in the last 168 hours.  CBC: Recent Labs  Lab 09/09/20 0601 09/11/20 0451 09/14/20 0730  WBC 12.8* 14.9* 21.2*  HGB 8.8* 8.9* 8.9*  HCT 28.3* 29.3* 28.3*  MCV 81.8 82.8 81.1  PLT 231 224 213    Cardiac Enzymes: No results for input(s): CKTOTAL, CKMB, CKMBINDEX, TROPONINI in the last 168 hours.  BNP (last 3 results) No results for input(s): BNP in the last 8760 hours.  ProBNP (last 3 results) No  results for input(s): PROBNP in the last 8760 hours.  Radiological Exams: No results found.  Assessment/Plan Active Problems:   Acute on chronic respiratory failure with hypoxia (HCC)   End stage renal disease on dialysis (HCC)   Lobar pneumonia, unspecified organism (HCC)   Chronic diastolic CHF (congestive heart failure) (HCC)   Acute on chronic respiratory failure with hypoxia we will continue with the weaning process I am hopeful to decannulate the patient today End-stage renal failure on hemodialysis we will continue to follow along Lobar pneumonia treated supportive care Chronic diastolic heart failure compensated Tracheostomy will be removed today   I have personally seen and evaluated the patient, evaluated laboratory and imaging results, formulated the assessment and plan and placed orders. The Patient requires high complexity decision making with multiple systems involvement.  Rounds were done with the Respiratory Therapy Director and Staff therapists and discussed with nursing staff also.  Allyne Gee, MD [facility] Pulmonary Critical Care Medicine Sleep Medicine

## 2020-09-15 DIAGNOSIS — N186 End stage renal disease: Secondary | ICD-10-CM | POA: Diagnosis not present

## 2020-09-15 DIAGNOSIS — J9621 Acute and chronic respiratory failure with hypoxia: Secondary | ICD-10-CM | POA: Diagnosis not present

## 2020-09-15 DIAGNOSIS — I5032 Chronic diastolic (congestive) heart failure: Secondary | ICD-10-CM | POA: Diagnosis not present

## 2020-09-15 DIAGNOSIS — J181 Lobar pneumonia, unspecified organism: Secondary | ICD-10-CM | POA: Diagnosis not present

## 2020-09-15 NOTE — Progress Notes (Signed)
Pulmonary [place]  PROGRESS NOTE     Jeffery Andrews  [id number]  DOB: 17-Sep-1975   DOA: 07/17/2020  Referring Physician: Satira Sark, MD  HPI: Jeffery Andrews is a 45 y.o. male being followed for ventilator/airway/oxygen weaning Acute on Chronic Respiratory Failure.  Patient is capping on room air still.  Yesterday our plan was to try to do trial of decannulation however patient started to have episodes of vomiting and there was concern of aspiration.  Chest x-ray was ordered and actually I personally reviewed the chest film and it does not look like there is any acute aspiration event however due to some persistence of vomiting or holding off on decannulation  Medications: Reviewed on Rounds  Physical Exam:  Vitals: Temperature is 97.3 pulse 80 respiratory is 14 blood pressure is 160/87 saturations 99%  Ventilator Settings capping on room air  General: Comfortable at this time Neck: supple Cardiovascular: no malignant arrhythmias Respiratory: No rhonchi very coarse breath sounds Skin: no rash seen on limited exam Musculoskeletal: No gross abnormality Psychiatric:unable to assess Neurologic:no involuntary movements         Lab Data:   Basic Metabolic Panel: Recent Labs  Lab 09/09/20 0601 09/11/20 0451 09/14/20 0730  NA 133* 136 132*  K 4.2 4.7 4.8  CL 94* 94* 92*  CO2 '26 27 26  '$ GLUCOSE 201* 166* 159*  BUN 49* 60* 91*  CREATININE 4.29* 4.03* 5.15*  CALCIUM 9.7 10.4* 9.9  PHOS 2.5 2.8 3.5    ABG: No results for input(s): PHART, PCO2ART, PO2ART, HCO3, O2SAT in the last 168 hours.  Liver Function Tests: Recent Labs  Lab 09/09/20 0601 09/11/20 0451 09/14/20 0730  ALBUMIN 1.8* 1.9* 1.8*   No results for input(s): LIPASE, AMYLASE in the last 168 hours. No results for input(s): AMMONIA in the last 168 hours.  CBC: Recent Labs  Lab 09/09/20 0601 09/11/20 0451  09/14/20 0730  WBC 12.8* 14.9* 21.2*  HGB 8.8* 8.9* 8.9*  HCT 28.3* 29.3* 28.3*  MCV 81.8 82.8 81.1  PLT 231 224 213    Cardiac Enzymes: No results for input(s): CKTOTAL, CKMB, CKMBINDEX, TROPONINI in the last 168 hours.  BNP (last 3 results) No results for input(s): BNP in the last 8760 hours.  ProBNP (last 3 results) No results for input(s): PROBNP in the last 8760 hours.  Radiological Exams: DG Chest Port 1 View  Result Date: 09/14/2020 CLINICAL DATA:  Recent vomiting, possible aspiration EXAM: PORTABLE CHEST 1 VIEW COMPARISON:  09/11/2020 FINDINGS: Cardiac shadow is stable. Tracheostomy tube is noted in satisfactory position. Some linear scarring is noted in the right upper lobe. No focal infiltrate is seen to suggest aspiration. Minimal blunting of the right costophrenic angle remains. IMPRESSION: No definitive findings of aspiration. Minimal blunting of the right costophrenic angle. Electronically Signed   By: Inez Catalina M.D.   On: 09/14/2020 12:27    Assessment/Plan Active Problems:   Acute on chronic respiratory failure with hypoxia (HCC)   End stage renal disease on dialysis (HCC)   Lobar pneumonia, unspecified organism (HCC)   Chronic diastolic CHF (congestive heart failure) (HCC)   Acute on chronic respiratory failure hypoxia we will continue with capping at this time however patient has had vomiting not holding off right now on decannulation End-stage renal disease on hemodialysis we will continue to follow along Lobar pneumonia treated we will continue with supportive care Chronic diastolic heart failure compensated at  this stage Vomiting at risk for aspiration so holding off on decannulation   I have personally seen and evaluated the patient, evaluated laboratory and imaging results, formulated the assessment and plan and placed orders. The Patient requires high complexity decision making with multiple systems involvement.  Rounds were done with the Respiratory  Therapy Director and Staff therapists and discussed with nursing staff also.  Allyne Gee, MD [facility] Pulmonary Critical Care Medicine Sleep Medicine

## 2020-09-16 DIAGNOSIS — N186 End stage renal disease: Secondary | ICD-10-CM | POA: Diagnosis not present

## 2020-09-16 DIAGNOSIS — J9621 Acute and chronic respiratory failure with hypoxia: Secondary | ICD-10-CM | POA: Diagnosis not present

## 2020-09-16 DIAGNOSIS — I5032 Chronic diastolic (congestive) heart failure: Secondary | ICD-10-CM | POA: Diagnosis not present

## 2020-09-16 DIAGNOSIS — J181 Lobar pneumonia, unspecified organism: Secondary | ICD-10-CM | POA: Diagnosis not present

## 2020-09-16 LAB — RENAL FUNCTION PANEL
Albumin: 1.8 g/dL — ABNORMAL LOW (ref 3.5–5.0)
Anion gap: 15 (ref 5–15)
BUN: 74 mg/dL — ABNORMAL HIGH (ref 6–20)
CO2: 25 mmol/L (ref 22–32)
Calcium: 9.3 mg/dL (ref 8.9–10.3)
Chloride: 91 mmol/L — ABNORMAL LOW (ref 98–111)
Creatinine, Ser: 4.65 mg/dL — ABNORMAL HIGH (ref 0.61–1.24)
GFR, Estimated: 15 mL/min — ABNORMAL LOW (ref >60)
Glucose, Bld: 220 mg/dL — ABNORMAL HIGH (ref 70–99)
Phosphorus: 4.1 mg/dL (ref 2.5–4.6)
Potassium: 4.3 mmol/L (ref 3.5–5.1)
Sodium: 131 mmol/L — ABNORMAL LOW (ref 135–145)

## 2020-09-16 LAB — CBC
HCT: 25.8 % — ABNORMAL LOW (ref 39.0–52.0)
Hemoglobin: 8.1 g/dL — ABNORMAL LOW (ref 13.0–17.0)
MCH: 25.4 pg — ABNORMAL LOW (ref 26.0–34.0)
MCHC: 31.4 g/dL (ref 30.0–36.0)
MCV: 80.9 fL (ref 80.0–100.0)
Platelets: 225 10*3/uL (ref 150–400)
RBC: 3.19 MIL/uL — ABNORMAL LOW (ref 4.22–5.81)
RDW: 19.6 % — ABNORMAL HIGH (ref 11.5–15.5)
WBC: 26.2 10*3/uL — ABNORMAL HIGH (ref 4.0–10.5)
nRBC: 0 % (ref 0.0–0.2)

## 2020-09-16 NOTE — Progress Notes (Signed)
Pulmonary [place]  PROGRESS NOTE     Jeffery Andrews  [id number]  DOB: [birth year]   DOA: 07/17/2020  Referring Physician: Satira Sark, MD  HPI: Jeffery Andrews is a 45 y.o. male being followed for ventilator/airway/oxygen weaning Acute on Chronic Respiratory Failure.  Patient is comfortable right now without distress has been capping on room air vomiting still present  Medications: Reviewed on Rounds  Physical Exam:  Vitals: Temperature 97.7 pulse of 71 respiratory rate is 12 blood pressure is 159/86 saturations 100%  Ventilator Settings patient is currently capping on room air  General: Comfortable at this time Neck: supple Cardiovascular: no malignant arrhythmias Respiratory: No rhonchi very coarse breath sounds Skin: no rash seen on limited exam Musculoskeletal: No gross abnormality Psychiatric:unable to assess Neurologic:no involuntary movements         Lab Data:   Basic Metabolic Panel: Recent Labs  Lab 09/11/20 0451 09/14/20 0730 09/16/20 0515  NA 136 132* 131*  K 4.7 4.8 4.3  CL 94* 92* 91*  CO2 '27 26 25  '$ GLUCOSE 166* 159* 220*  BUN 60* 91* 74*  CREATININE 4.03* 5.15* 4.65*  CALCIUM 10.4* 9.9 9.3  PHOS 2.8 3.5 4.1    ABG: No results for input(s): PHART, PCO2ART, PO2ART, HCO3, O2SAT in the last 168 hours.  Liver Function Tests: Recent Labs  Lab 09/11/20 0451 09/14/20 0730 09/16/20 0515  ALBUMIN 1.9* 1.8* 1.8*   No results for input(s): LIPASE, AMYLASE in the last 168 hours. No results for input(s): AMMONIA in the last 168 hours.  CBC: Recent Labs  Lab 09/11/20 0451 09/14/20 0730 09/16/20 0515  WBC 14.9* 21.2* 26.2*  HGB 8.9* 8.9* 8.1*  HCT 29.3* 28.3* 25.8*  MCV 82.8 81.1 80.9  PLT 224 213 225    Cardiac Enzymes: No results for input(s): CKTOTAL, CKMB, CKMBINDEX, TROPONINI in the last 168 hours.  BNP (last 3 results) No results for  input(s): BNP in the last 8760 hours.  ProBNP (last 3 results) No results for input(s): PROBNP in the last 8760 hours.  Radiological Exams: DG Chest Port 1 View  Result Date: 09/14/2020 CLINICAL DATA:  Recent vomiting, possible aspiration EXAM: PORTABLE CHEST 1 VIEW COMPARISON:  09/11/2020 FINDINGS: Cardiac shadow is stable. Tracheostomy tube is noted in satisfactory position. Some linear scarring is noted in the right upper lobe. No focal infiltrate is seen to suggest aspiration. Minimal blunting of the right costophrenic angle remains. IMPRESSION: No definitive findings of aspiration. Minimal blunting of the right costophrenic angle. Electronically Signed   By: Inez Catalina M.D.   On: 09/14/2020 12:27    Assessment/Plan Active Problems:   Acute on chronic respiratory failure with hypoxia (HCC)   End stage renal disease on dialysis (HCC)   Lobar pneumonia, unspecified organism (HCC)   Chronic diastolic CHF (congestive heart failure) (HCC)   Acute on chronic respiratory failure with hypoxia we will continue with capping trials holding off on decannulation because of some vomiting episodes seems to be doing a little bit better though End-stage renal failure on hemodialysis supportive care nephrology following Lobar pneumonia treated slow improvement Chronic diastolic heart failure compensated Tracheostomy remains in place for now   I have personally seen and evaluated the patient, evaluated laboratory and imaging results, formulated the assessment and plan and placed orders. The Patient requires high complexity decision making with multiple systems involvement.  Rounds were done with the Respiratory Therapy Director and Staff therapists  and discussed with nursing staff also.  Allyne Gee, MD [facility] Pulmonary Critical Care Medicine Sleep Medicine

## 2020-09-16 NOTE — Progress Notes (Signed)
[facility]  ROUNDING NOTE   Subjective:  Patient due for hemodialysis treatment again today. Sitting up in chair at the moment.  Objective:  Vital signs in last 24 hours:  Temperature 98.7 pulse 90 respirations 23 blood pressure 152/73  Physical Exam: General:  No acute distress  Head:  Normocephalic, atraumatic.    Eyes:  Anicteric  Neck:  Tracheostomy in place  Lungs:   Clear to auscultation, normal effort  Heart:  S1S2 no rubs  Abdomen:   Soft, nontender, bowel sounds present  Extremities:  Trace peripheral edema.  Neurologic:  Bilateral hand contractures  Skin:  No acute skin rash  Access:  Left upper extremity AV fistula    Basic Metabolic Panel: Recent Labs  Lab 09/11/20 0451 09/14/20 0730 09/16/20 0515  NA 136 132* 131*  K 4.7 4.8 4.3  CL 94* 92* 91*  CO2 '27 26 25  '$ GLUCOSE 166* 159* 220*  BUN 60* 91* 74*  CREATININE 4.03* 5.15* 4.65*  CALCIUM 10.4* 9.9 9.3  PHOS 2.8 3.5 4.1     Liver Function Tests: Recent Labs  Lab 09/11/20 0451 09/14/20 0730 09/16/20 0515  ALBUMIN 1.9* 1.8* 1.8*    No results for input(s): LIPASE, AMYLASE in the last 168 hours. No results for input(s): AMMONIA in the last 168 hours.  CBC: Recent Labs  Lab 09/11/20 0451 09/14/20 0730 09/16/20 0515  WBC 14.9* 21.2* 26.2*  HGB 8.9* 8.9* 8.1*  HCT 29.3* 28.3* 25.8*  MCV 82.8 81.1 80.9  PLT 224 213 225     Cardiac Enzymes: No results for input(s): CKTOTAL, CKMB, CKMBINDEX, TROPONINI in the last 168 hours.  BNP: Invalid input(s): POCBNP  CBG: No results for input(s): GLUCAP in the last 168 hours.  Microbiology: Results for orders placed or performed during the hospital encounter of 07/17/20  Culture, blood (routine x 2)     Status: None   Collection Time: 07/27/20 11:17 AM   Specimen: BLOOD RIGHT ARM  Result Value Ref Range Status   Specimen Description BLOOD RIGHT ARM  Final   Special Requests   Final    BOTTLES DRAWN AEROBIC ONLY Blood Culture  results may not be optimal due to an inadequate volume of blood received in culture bottles   Culture   Final    NO GROWTH 5 DAYS Performed at [facility], [place] [address] [place], [place] [address]    Report Status 08/01/2020 FINAL  Final  Culture, blood (routine x 2)     Status: None   Collection Time: 07/27/20 11:23 AM   Specimen: BLOOD RIGHT HAND  Result Value Ref Range Status   Specimen Description BLOOD RIGHT HAND  Final   Special Requests   Final    BOTTLES DRAWN AEROBIC ONLY Blood Culture results may not be optimal due to an inadequate volume of blood received in culture bottles   Culture   Final    NO GROWTH 5 DAYS Performed at [facility], [place] [address] [place], [place] [address]    Report Status 08/01/2020 FINAL  Final  Culture, blood (routine x 2)     Status: None   Collection Time: 08/01/20  2:25 PM   Specimen: BLOOD RIGHT HAND  Result Value Ref Range Status   Specimen Description BLOOD RIGHT HAND  Final   Special Requests   Final    BOTTLES DRAWN AEROBIC AND ANAEROBIC Blood Culture results may not be optimal due to an inadequate volume of blood received in  culture bottles   Culture   Final    NO GROWTH 5 DAYS Performed at [facility], [place] [address], [place], [place] [address]    Report Status 08/06/2020 FINAL  Final  Culture, blood (routine x 2)     Status: None   Collection Time: 08/01/20  2:39 PM   Specimen: BLOOD RIGHT ARM  Result Value Ref Range Status   Specimen Description BLOOD RIGHT ARM  Final   Special Requests   Final    BOTTLES DRAWN AEROBIC ONLY Blood Culture results may not be optimal due to an inadequate volume of blood received in culture bottles   Culture   Final    NO GROWTH 5 DAYS Performed at [facility], [place] [address], [place], [place] [address]    Report Status 08/06/2020 FINAL  Final  Culture, Respiratory w Gram Stain     Status: None   Collection Time: 08/01/20  2:55 PM   Specimen: Tracheal Aspirate;  Respiratory  Result Value Ref Range Status   Specimen Description TRACHEAL ASPIRATE  Final   Special Requests NONE  Final   Gram Stain   Final    FEW WBC PRESENT, PREDOMINANTLY PMN MODERATE GRAM NEGATIVE RODS Performed at [facility], [place] [address], [place], [place] [address]    Culture ABUNDANT PSEUDOMONAS AERUGINOSA  Final   Report Status 08/04/2020 FINAL  Final   Organism ID, Bacteria PSEUDOMONAS AERUGINOSA  Final      Susceptibility   Pseudomonas aeruginosa - MIC*    CEFTAZIDIME <=1 SENSITIVE Sensitive     CIPROFLOXACIN <=0.25 SENSITIVE Sensitive     GENTAMICIN 8 INTERMEDIATE Intermediate     IMIPENEM >=16 RESISTANT Resistant     PIP/TAZO <=4 SENSITIVE Sensitive     CEFEPIME 2 SENSITIVE Sensitive     * ABUNDANT PSEUDOMONAS AERUGINOSA    Coagulation Studies: No results for input(s): LABPROT, INR in the last 72 hours.   Urinalysis: No results for input(s): COLORURINE, LABSPEC, PHURINE, GLUCOSEU, HGBUR, BILIRUBINUR, KETONESUR, PROTEINUR, UROBILINOGEN, NITRITE, LEUKOCYTESUR in the last 72 hours.  Invalid input(s): APPERANCEUR    Imaging: DG Chest Port 1 View  Result Date: 09/14/2020 CLINICAL DATA:  Recent vomiting, possible aspiration EXAM: PORTABLE CHEST 1 VIEW COMPARISON:  09/11/2020 FINDINGS: Cardiac shadow is stable. Tracheostomy tube is noted in satisfactory position. Some linear scarring is noted in the right upper lobe. No focal infiltrate is seen to suggest aspiration. Minimal blunting of the right costophrenic angle remains. IMPRESSION: No definitive findings of aspiration. Minimal blunting of the right costophrenic angle. Electronically Signed   By: Inez Catalina M.D.   On: 09/14/2020 12:27     Medications:       Assessment/ Plan:  45 y.o. male with a PMHx of ESRD on HD, posterior reversible encephalopathy syndrome, diabetes mellitus type 2, chronic systolic heart failure, anemia of chronic kidney disease, secondary hyperparathyroidism, malignant  hypertension, acute respiratory failure, who was admitted to [facility] on 06/29/2020 for ongoing treatment of acute respiratory failure, posterior reversible encephalopathy syndrome, and end-stage renal disease.   1.  ESRD on HD.  Patient due for hemodialysis treatment today.  2.  Anemia of chronic kidney disease.  Hemoglobin currently 8.1.  We will maintain the patient on Retacrit 6000 IV with dialysis treatments.  3.  Secondary hyperparathyroidism.  Serum phosphorus 4.1 and acceptable.  4.  Acute respiratory failure.  Tracheostomy placed 07/17/2020.  Tracheostomy remains capped and respiratory status appears to be stable.  5.  Hypertension.  Maintain the patient on amlodipine, clonidine, hydralazine, losartan.  Blood pressure currently 159/86 and is better than earlier in the admission.    LOS: 0 Lennix Kneisel 9/14/20228:38 AM

## 2020-09-17 ENCOUNTER — Other Ambulatory Visit (HOSPITAL_COMMUNITY): Payer: Medicare Other

## 2020-09-17 DIAGNOSIS — J9621 Acute and chronic respiratory failure with hypoxia: Secondary | ICD-10-CM | POA: Diagnosis not present

## 2020-09-17 DIAGNOSIS — J181 Lobar pneumonia, unspecified organism: Secondary | ICD-10-CM | POA: Diagnosis not present

## 2020-09-17 DIAGNOSIS — I5032 Chronic diastolic (congestive) heart failure: Secondary | ICD-10-CM | POA: Diagnosis not present

## 2020-09-17 DIAGNOSIS — N186 End stage renal disease: Secondary | ICD-10-CM | POA: Diagnosis not present

## 2020-09-17 LAB — CBC WITH DIFFERENTIAL/PLATELET
Abs Immature Granulocytes: 0 10*3/uL (ref 0.00–0.07)
Basophils Absolute: 0.3 10*3/uL — ABNORMAL HIGH (ref 0.0–0.1)
Basophils Relative: 1 %
Eosinophils Absolute: 0 10*3/uL (ref 0.0–0.5)
Eosinophils Relative: 0 %
HCT: 23.9 % — ABNORMAL LOW (ref 39.0–52.0)
Hemoglobin: 7.1 g/dL — ABNORMAL LOW (ref 13.0–17.0)
Lymphocytes Relative: 5 %
Lymphs Abs: 1.4 10*3/uL (ref 0.7–4.0)
MCH: 24.4 pg — ABNORMAL LOW (ref 26.0–34.0)
MCHC: 29.7 g/dL — ABNORMAL LOW (ref 30.0–36.0)
MCV: 82.1 fL (ref 80.0–100.0)
Monocytes Absolute: 0.5 10*3/uL (ref 0.1–1.0)
Monocytes Relative: 2 %
Neutro Abs: 25 10*3/uL — ABNORMAL HIGH (ref 1.7–7.7)
Neutrophils Relative %: 92 %
Platelets: 253 10*3/uL (ref 150–400)
RBC: 2.91 MIL/uL — ABNORMAL LOW (ref 4.22–5.81)
RDW: 18.8 % — ABNORMAL HIGH (ref 11.5–15.5)
WBC: 27.2 10*3/uL — ABNORMAL HIGH (ref 4.0–10.5)
nRBC: 0 % (ref 0.0–0.2)
nRBC: 0 /100 WBC

## 2020-09-17 LAB — BLOOD GAS, ARTERIAL
Acid-Base Excess: 3.4 mmol/L — ABNORMAL HIGH (ref 0.0–2.0)
Bicarbonate: 27.2 mmol/L (ref 20.0–28.0)
FIO2: 28
O2 Saturation: 98.8 %
Patient temperature: 37.2
pCO2 arterial: 40.4 mmHg (ref 32.0–48.0)
pH, Arterial: 7.444 (ref 7.350–7.450)
pO2, Arterial: 115 mmHg — ABNORMAL HIGH (ref 83.0–108.0)

## 2020-09-17 MED ORDER — IOHEXOL 350 MG/ML SOLN
80.0000 mL | Freq: Once | INTRAVENOUS | Status: AC | PRN
  Administered 2020-09-17: 80 mL via INTRAVENOUS

## 2020-09-17 NOTE — Progress Notes (Signed)
[facility] [place]  PROGRESS NOTE     Jeffery Andrews  [id number]  DOB: 13-Dec-1975   DOA: 07/17/2020  Referring Physician: Satira Sark, MD  HPI: Jeffery Andrews is a 45 y.o. male being followed for ventilator/airway/oxygen weaning Acute on Chronic Respiratory Failure.  Patient continues to have some difficulty with the capping since his vomiting episodes.  CT was done which does show some increasing groundglass opacities.  Medications: Reviewed on Rounds  Physical Exam:  Vitals: Temperature is 101.3 pulse 76 respiratory rate is 17 blood pressure 130/73 saturations 98%  Ventilator Settings capping right now on room air  General: Comfortable at this time Neck: supple Cardiovascular: no malignant arrhythmias Respiratory: Coarse rhonchi are noted bilaterally Skin: no rash seen on limited exam Musculoskeletal: No gross abnormality Psychiatric:unable to assess Neurologic:no involuntary movements         Lab Data:   Basic Metabolic Panel: Recent Labs  Lab 09/11/20 0451 09/14/20 0730 09/16/20 0515  NA 136 132* 131*  K 4.7 4.8 4.3  CL 94* 92* 91*  CO2 '27 26 25  '$ GLUCOSE 166* 159* 220*  BUN 60* 91* 74*  CREATININE 4.03* 5.15* 4.65*  CALCIUM 10.4* 9.9 9.3  PHOS 2.8 3.5 4.1    ABG: No results for input(s): PHART, PCO2ART, PO2ART, HCO3, O2SAT in the last 168 hours.  Liver Function Tests: Recent Labs  Lab 09/11/20 0451 09/14/20 0730 09/16/20 0515  ALBUMIN 1.9* 1.8* 1.8*   No results for input(s): LIPASE, AMYLASE in the last 168 hours. No results for input(s): AMMONIA in the last 168 hours.  CBC: Recent Labs  Lab 09/11/20 0451 09/14/20 0730 09/16/20 0515  WBC 14.9* 21.2* 26.2*  HGB 8.9* 8.9* 8.1*  HCT 29.3* 28.3* 25.8*  MCV 82.8 81.1 80.9  PLT 224 213 225    Cardiac Enzymes: No results for input(s): CKTOTAL, CKMB, CKMBINDEX, TROPONINI in the last 168  hours.  BNP (last 3 results) No results for input(s): BNP in the last 8760 hours.  ProBNP (last 3 results) No results for input(s): PROBNP in the last 8760 hours.  Radiological Exams: CT CHEST W CONTRAST  Result Date: 09/17/2020 CLINICAL DATA:  Hepatitis, abdominal tenderness, possible aspiration EXAM: CT CHEST, ABDOMEN, AND PELVIS WITH CONTRAST TECHNIQUE: Multidetector CT imaging of the chest, abdomen and pelvis was performed following the standard protocol during bolus administration of intravenous contrast. CONTRAST:  36m OMNIPAQUE IOHEXOL 350 MG/ML SOLN COMPARISON:  Chest radiograph dated 09/14/2020. CT abdomen dated 07/21/2020. FINDINGS: CT CHEST FINDINGS Cardiovascular: Heart is normal in size.  No pericardial effusion. No evidence of thoracic aortic aneurysm. Mediastinum/Nodes: No suspicious mediastinal lymphadenopathy. Visualized thyroid is unremarkable. Lungs/Pleura: Tracheostomy in satisfactory position. Mild scarring/atelectasis at the lung bases. No focal consolidation or findings suspicious for aspiration. Trace right pleural effusion. No suspicious pulmonary nodules. No pneumothorax. Musculoskeletal: No focal osseous lesions. CT ABDOMEN PELVIS FINDINGS Limited evaluation due to streak artifact from the patient's arms, poor soft tissue contrast, and motion degradation. Hepatobiliary: Liver is within normal limits. Gallbladder is unremarkable. No intrahepatic or extrahepatic ductal dilatation. Pancreas: Poorly evaluated due to streak artifact but grossly unremarkable. Spleen: Within normal limits. Adrenals/Urinary Tract: Adrenal glands are within normal limits. Bilateral renal cortical atrophy with multiple small cysts, poorly evaluated. No hydronephrosis. Bladder is decompressed and poorly evaluated. Stomach/Bowel: Stomach is notable for a percutaneous gastrostomy. No evidence of bowel obstruction. Appendix is not discretely visualized. Vascular/Lymphatic: No evidence of abdominal aortic  aneurysm.  No suspicious abdominopelvic lymphadenopathy. Reproductive: Prostate is unremarkable. Other: No abdominopelvic ascites. Musculoskeletal: Large sacral decubitus ulcer with wound VAC (series 3/image 118). 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 Associated soft tissue gas with fluid extending into the left gluteal region (series 3/image 126). Suspected 4.0 x 2.8 cm low-density lesion anterior to the coccyx (series 3/image 124), poorly visualized. Poor soft tissue contrast.  Possible mild body wall edema. IMPRESSION: No findings suspicious for aspiration. Trace right pleural effusion. Limited evaluation of the abdomen/pelvis due to streak artifact, motion degradation, and poor soft tissue contrast. 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 Small low-density lesion/fluid collection anterior to the coccyx, poorly evaluated. Tracheostomy and gastrostomy. Electronically Signed   By: Julian Hy M.D.   On: 09/17/2020 04:04   CT ABDOMEN PELVIS W CONTRAST  Result Date: 09/17/2020 CLINICAL DATA:  Hepatitis, abdominal tenderness, possible aspiration EXAM: CT CHEST, ABDOMEN, AND PELVIS WITH CONTRAST TECHNIQUE: Multidetector CT imaging of the chest, abdomen and pelvis was performed following the standard protocol during bolus administration of intravenous contrast. CONTRAST:  11m OMNIPAQUE IOHEXOL 350 MG/ML SOLN COMPARISON:  Chest radiograph dated 09/14/2020. CT abdomen dated 07/21/2020. FINDINGS: CT CHEST FINDINGS Cardiovascular: Heart is normal in size.  No pericardial effusion. No evidence of thoracic aortic aneurysm. Mediastinum/Nodes: No suspicious mediastinal lymphadenopathy. Visualized thyroid is unremarkable. Lungs/Pleura: Tracheostomy in satisfactory position. Mild scarring/atelectasis at the lung bases. No focal consolidation or findings suspicious for aspiration. Trace right pleural effusion. No suspicious  pulmonary nodules. No pneumothorax. Musculoskeletal: No focal osseous lesions. CT ABDOMEN PELVIS FINDINGS Limited evaluation due to streak artifact from the patient's arms, poor soft tissue contrast, and motion degradation. Hepatobiliary: Liver is within normal limits. Gallbladder is unremarkable. No intrahepatic or extrahepatic ductal dilatation. Pancreas: Poorly evaluated due to streak artifact but grossly unremarkable. Spleen: Within normal limits. Adrenals/Urinary Tract: Adrenal glands are within normal limits. Bilateral renal cortical atrophy with multiple small cysts, poorly evaluated. No hydronephrosis. Bladder is decompressed and poorly evaluated. Stomach/Bowel: Stomach is notable for a percutaneous gastrostomy. No evidence of bowel obstruction. Appendix is not discretely visualized. Vascular/Lymphatic: No evidence of abdominal aortic aneurysm. No suspicious abdominopelvic lymphadenopathy. Reproductive: Prostate is unremarkable. Other: No abdominopelvic ascites. Musculoskeletal: Large sacral decubitus ulcer with wound VAC (series 3/image 118). 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 Associated soft tissue gas with fluid extending into the left gluteal region (series 3/image 126). Suspected 4.0 x 2.8 cm low-density lesion anterior to the coccyx (series 3/image 124), poorly visualized. Poor soft tissue contrast.  Possible mild body wall edema. IMPRESSION: No findings suspicious for aspiration. Trace right pleural effusion. Limited evaluation of the abdomen/pelvis due to streak artifact, motion degradation, and poor soft tissue contrast. 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 Small low-density lesion/fluid collection anterior to the coccyx, poorly evaluated. Tracheostomy and gastrostomy. Electronically Signed   By: SJulian HyM.D.   On: 09/17/2020 04:04    Assessment/Plan Active Problems:   Acute on  chronic respiratory failure with hypoxia (HCC)   End stage renal disease on dialysis (HCC)   Lobar pneumonia, unspecified organism (HCC)   Chronic diastolic CHF (congestive heart failure) (HCC)   Acute on chronic respiratory failure with hypoxia we will continue with full support right now however I am going to stop capping and then place him back on T collar until his acute issues are resolved.  Patient does have a fever so he likely did  aspirate and probably has underlying pneumonitis developing End-stage renal failure patient is on hemodialysis we will continue with supportive care Lobar pneumonia has been treated with antibiotics we will continue with present therapy Chronic diastolic heart failure appears to be compensated Probable aspiration pneumonia we will continue to monitor closely x-ray showing some increased lower lobe densities based on my evaluation of the CT scan   I have personally seen and evaluated the patient, evaluated laboratory and imaging results, formulated the assessment and plan and placed orders. The Patient requires high complexity decision making with multiple systems involvement.  Rounds were done with the Respiratory Therapy Director and Staff therapists and discussed with nursing staff also.  Allyne Gee, MD [facility] Pulmonary Critical Care Medicine Sleep Medicine

## 2020-09-18 ENCOUNTER — Other Ambulatory Visit (HOSPITAL_COMMUNITY): Payer: Medicare Other

## 2020-09-18 LAB — BASIC METABOLIC PANEL
Anion gap: 13 (ref 5–15)
BUN: 69 mg/dL — ABNORMAL HIGH (ref 6–20)
CO2: 26 mmol/L (ref 22–32)
Calcium: 9.2 mg/dL (ref 8.9–10.3)
Chloride: 95 mmol/L — ABNORMAL LOW (ref 98–111)
Creatinine, Ser: 4.4 mg/dL — ABNORMAL HIGH (ref 0.61–1.24)
GFR, Estimated: 16 mL/min — ABNORMAL LOW (ref >60)
Glucose, Bld: 140 mg/dL — ABNORMAL HIGH (ref 70–99)
Potassium: 4.1 mmol/L (ref 3.5–5.1)
Sodium: 134 mmol/L — ABNORMAL LOW (ref 135–145)

## 2020-09-18 LAB — RENAL FUNCTION PANEL
Albumin: 1.8 g/dL — ABNORMAL LOW (ref 3.5–5.0)
Anion gap: 14 (ref 5–15)
BUN: 69 mg/dL — ABNORMAL HIGH (ref 6–20)
CO2: 25 mmol/L (ref 22–32)
Calcium: 9.2 mg/dL (ref 8.9–10.3)
Chloride: 95 mmol/L — ABNORMAL LOW (ref 98–111)
Creatinine, Ser: 4.34 mg/dL — ABNORMAL HIGH (ref 0.61–1.24)
GFR, Estimated: 16 mL/min — ABNORMAL LOW (ref >60)
Glucose, Bld: 137 mg/dL — ABNORMAL HIGH (ref 70–99)
Phosphorus: 4.2 mg/dL (ref 2.5–4.6)
Potassium: 4.1 mmol/L (ref 3.5–5.1)
Sodium: 134 mmol/L — ABNORMAL LOW (ref 135–145)

## 2020-09-18 LAB — CBC WITH DIFFERENTIAL/PLATELET
Abs Immature Granulocytes: 0.29 10*3/uL — ABNORMAL HIGH (ref 0.00–0.07)
Basophils Absolute: 0.1 10*3/uL (ref 0.0–0.1)
Basophils Relative: 0 %
Eosinophils Absolute: 0.1 10*3/uL (ref 0.0–0.5)
Eosinophils Relative: 0 %
HCT: 23.3 % — ABNORMAL LOW (ref 39.0–52.0)
Hemoglobin: 7.1 g/dL — ABNORMAL LOW (ref 13.0–17.0)
Immature Granulocytes: 1 %
Lymphocytes Relative: 3 %
Lymphs Abs: 1 10*3/uL (ref 0.7–4.0)
MCH: 24.7 pg — ABNORMAL LOW (ref 26.0–34.0)
MCHC: 30.5 g/dL (ref 30.0–36.0)
MCV: 81.2 fL (ref 80.0–100.0)
Monocytes Absolute: 0.9 10*3/uL (ref 0.1–1.0)
Monocytes Relative: 3 %
Neutro Abs: 30.3 10*3/uL — ABNORMAL HIGH (ref 1.7–7.7)
Neutrophils Relative %: 93 %
Platelets: 287 10*3/uL (ref 150–400)
RBC: 2.87 MIL/uL — ABNORMAL LOW (ref 4.22–5.81)
RDW: 18.8 % — ABNORMAL HIGH (ref 11.5–15.5)
WBC: 32.8 10*3/uL — ABNORMAL HIGH (ref 4.0–10.5)
nRBC: 0 % (ref 0.0–0.2)

## 2020-09-18 NOTE — Progress Notes (Signed)
[facility]  ROUNDING NOTE   Subjective:  Patient seen and evaluated during hemodialysis treatment today. Tolerating well at the moment. Continues on MWF dialysis schedule.  Objective:  Vital signs in last 24 hours:  Temperature 100.8 pulse 80 respirations 24 blood pressure 110/68  Physical Exam: General:  No acute distress  Head:  Normocephalic, atraumatic.    Eyes:  Anicteric  Neck:  Tracheostomy in place  Lungs:   Clear to auscultation, normal effort  Heart:  S1S2 no rubs  Abdomen:   Soft, nontender, bowel sounds present  Extremities: No peripheral edema in lower extremities  Neurologic:  Bilateral hand contractures  Skin:  No acute skin rash  Access:  Left upper extremity AV fistula, left upper extremity swelling noted    Basic Metabolic Panel: Recent Labs  Lab 09/14/20 0730 09/16/20 0515 09/18/20 0537  NA 132* 131* 134*  134*  K 4.8 4.3 4.1  4.1  CL 92* 91* 95*  95*  CO2 '26 25 26  25  '$ GLUCOSE 159* 220* 140*  137*  BUN 91* 74* 69*  69*  CREATININE 5.15* 4.65* 4.40*  4.34*  CALCIUM 9.9 9.3 9.2  9.2  PHOS 3.5 4.1 4.2     Liver Function Tests: Recent Labs  Lab 09/14/20 0730 09/16/20 0515 09/18/20 0537  ALBUMIN 1.8* 1.8* 1.8*    No results for input(s): LIPASE, AMYLASE in the last 168 hours. No results for input(s): AMMONIA in the last 168 hours.  CBC: Recent Labs  Lab 09/14/20 0730 09/16/20 0515 09/17/20 1338 09/18/20 0537  WBC 21.2* 26.2* 27.2* 32.8*  NEUTROABS  --   --  25.0* 30.3*  HGB 8.9* 8.1* 7.1* 7.1*  HCT 28.3* 25.8* 23.9* 23.3*  MCV 81.1 80.9 82.1 81.2  PLT 213 225 253 287     Cardiac Enzymes: No results for input(s): CKTOTAL, CKMB, CKMBINDEX, TROPONINI in the last 168 hours.  BNP: Invalid input(s): POCBNP  CBG: No results for input(s): GLUCAP in the last 168 hours.  Microbiology: Results for orders placed or performed during the hospital encounter of 07/17/20  Culture, blood (routine x 2)     Status:  None   Collection Time: 07/27/20 11:17 AM   Specimen: BLOOD RIGHT ARM  Result Value Ref Range Status   Specimen Description BLOOD RIGHT ARM  Final   Special Requests   Final    BOTTLES DRAWN AEROBIC ONLY Blood Culture results may not be optimal due to an inadequate volume of blood received in culture bottles   Culture   Final    NO GROWTH 5 DAYS Performed at [facility], [place] [address] [place], [place] [address]    Report Status 08/01/2020 FINAL  Final  Culture, blood (routine x 2)     Status: None   Collection Time: 07/27/20 11:23 AM   Specimen: BLOOD RIGHT HAND  Result Value Ref Range Status   Specimen Description BLOOD RIGHT HAND  Final   Special Requests   Final    BOTTLES DRAWN AEROBIC ONLY Blood Culture results may not be optimal due to an inadequate volume of blood received in culture bottles   Culture   Final    NO GROWTH 5 DAYS Performed at [facility], [place] [address] [place], [place] [address]    Report Status 08/01/2020 FINAL  Final  Culture, blood (routine x 2)     Status: None   Collection Time: 08/01/20  2:25 PM   Specimen: BLOOD RIGHT HAND  Result  Value Ref Range Status   Specimen Description BLOOD RIGHT HAND  Final   Special Requests   Final    BOTTLES DRAWN AEROBIC AND ANAEROBIC Blood Culture results may not be optimal due to an inadequate volume of blood received in culture bottles   Culture   Final    NO GROWTH 5 DAYS Performed at [facility], [place] [address] [place], [place] [address]    Report Status 08/06/2020 FINAL  Final  Culture, blood (routine x 2)     Status: None   Collection Time: 08/01/20  2:39 PM   Specimen: BLOOD RIGHT ARM  Result Value Ref Range Status   Specimen Description BLOOD RIGHT ARM  Final   Special Requests   Final    BOTTLES DRAWN AEROBIC ONLY Blood Culture results may not be optimal due to an inadequate volume of blood received in culture bottles   Culture   Final    NO GROWTH 5 DAYS Performed at [facility], [place] [address] [place], [place] [address]    Report Status 08/06/2020 FINAL  Final  Culture, Respiratory w Gram Stain     Status: None   Collection Time: 08/01/20  2:55 PM   Specimen: Tracheal Aspirate; Respiratory  Result Value Ref Range Status   Specimen Description TRACHEAL ASPIRATE  Final   Special Requests NONE  Final   Gram Stain   Final    FEW WBC PRESENT, PREDOMINANTLY PMN MODERATE GRAM NEGATIVE RODS Performed at [facility], [place] [address], [place], [place] [address]    Culture ABUNDANT PSEUDOMONAS AERUGINOSA  Final   Report Status 08/04/2020 FINAL  Final   Organism ID, Bacteria PSEUDOMONAS AERUGINOSA  Final      Susceptibility   Pseudomonas aeruginosa - MIC*    CEFTAZIDIME <=1 SENSITIVE Sensitive     CIPROFLOXACIN <=0.25 SENSITIVE Sensitive     GENTAMICIN 8 INTERMEDIATE Intermediate     IMIPENEM >=16 RESISTANT Resistant     PIP/TAZO <=4 SENSITIVE Sensitive     CEFEPIME 2 SENSITIVE Sensitive     * ABUNDANT PSEUDOMONAS AERUGINOSA  Culture, Respiratory w Gram Stain     Status: None (Preliminary result)   Collection Time: 09/17/20  5:21 PM   Specimen: Tracheal Aspirate; Respiratory  Result Value Ref Range Status   Specimen Description TRACHEAL ASPIRATE  Final   Special Requests NONE  Final   Gram Stain   Final    ABUNDANT WBC PRESENT,BOTH PMN AND MONONUCLEAR MODERATE SQUAMOUS EPITHELIAL CELLS PRESENT MODERATE GRAM POSITIVE COCCI MODERATE GRAM NEGATIVE RODS    Culture   Final    CULTURE REINCUBATED FOR BETTER GROWTH Performed at [facility], [place] [address] [place],  [address]    Report Status PENDING  Incomplete    Coagulation Studies: No results for input(s): LABPROT, INR in the last 72 hours.   Urinalysis: No results for input(s): COLORURINE, LABSPEC, PHURINE, GLUCOSEU, HGBUR, BILIRUBINUR, KETONESUR, PROTEINUR, UROBILINOGEN, NITRITE, LEUKOCYTESUR in the last 72 hours.  Invalid input(s): APPERANCEUR    Imaging: DG  Chest 1 View  Result Date: 09/18/2020 CLINICAL DATA:  Aspiration into airway EXAM: CHEST  1 VIEW COMPARISON:  09/14/2020 FINDINGS: Chronic cardiomegaly. Low volume chest with indistinct density at the right base attributed to atelectasis by preceding CT. No visible effusion or pneumothorax. IMPRESSION: Low volume chest with mild atelectasis Electronically Signed   By: Jorje Guild M.D.   On: 09/18/2020 06:45   CT CHEST W CONTRAST  Result Date:  09/17/2020 CLINICAL DATA:  Hepatitis, abdominal tenderness, possible aspiration EXAM: CT CHEST, ABDOMEN, AND PELVIS WITH CONTRAST TECHNIQUE: Multidetector CT imaging of the chest, abdomen and pelvis was performed following the standard protocol during bolus administration of intravenous contrast. CONTRAST:  82m OMNIPAQUE IOHEXOL 350 MG/ML SOLN COMPARISON:  Chest radiograph dated 09/14/2020. CT abdomen dated 07/21/2020. FINDINGS: CT CHEST FINDINGS Cardiovascular: Heart is normal in size.  No pericardial effusion. No evidence of thoracic aortic aneurysm. Mediastinum/Nodes: No suspicious mediastinal lymphadenopathy. Visualized thyroid is unremarkable. Lungs/Pleura: Tracheostomy in satisfactory position. Mild scarring/atelectasis at the lung bases. No focal consolidation or findings suspicious for aspiration. Trace right pleural effusion. No suspicious pulmonary nodules. No pneumothorax. Musculoskeletal: No focal osseous lesions. CT ABDOMEN PELVIS FINDINGS Limited evaluation due to streak artifact from the patient's arms, poor soft tissue contrast, and motion degradation. Hepatobiliary: Liver is within normal limits. Gallbladder is unremarkable. No intrahepatic or extrahepatic ductal dilatation. Pancreas: Poorly evaluated due to streak artifact but grossly unremarkable. Spleen: Within normal limits. Adrenals/Urinary Tract: Adrenal glands are within normal limits. Bilateral renal cortical atrophy with multiple small cysts, poorly evaluated. No hydronephrosis. Bladder is  decompressed and poorly evaluated. Stomach/Bowel: Stomach is notable for a percutaneous gastrostomy. No evidence of bowel obstruction. Appendix is not discretely visualized. Vascular/Lymphatic: No evidence of abdominal aortic aneurysm. No suspicious abdominopelvic lymphadenopathy. Reproductive: Prostate is unremarkable. Other: No abdominopelvic ascites. Musculoskeletal: Large sacral decubitus ulcer with wound VAC (series 3/image 118). 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 Associated soft tissue gas with fluid extending into the left gluteal region (series 3/image 126). Suspected 4.0 x 2.8 cm low-density lesion anterior to the coccyx (series 3/image 124), poorly visualized. Poor soft tissue contrast.  Possible mild body wall edema. IMPRESSION: No findings suspicious for aspiration. Trace right pleural effusion. Limited evaluation of the abdomen/pelvis due to streak artifact, motion degradation, and poor soft tissue contrast. 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 Small low-density lesion/fluid collection anterior to the coccyx, poorly evaluated. Tracheostomy and gastrostomy. Electronically Signed   By: SJulian HyM.D.   On: 09/17/2020 04:04   CT ABDOMEN PELVIS W CONTRAST  Result Date: 09/17/2020 CLINICAL DATA:  Hepatitis, abdominal tenderness, possible aspiration EXAM: CT CHEST, ABDOMEN, AND PELVIS WITH CONTRAST TECHNIQUE: Multidetector CT imaging of the chest, abdomen and pelvis was performed following the standard protocol during bolus administration of intravenous contrast. CONTRAST:  848mOMNIPAQUE IOHEXOL 350 MG/ML SOLN COMPARISON:  Chest radiograph dated 09/14/2020. CT abdomen dated 07/21/2020. FINDINGS: CT CHEST FINDINGS Cardiovascular: Heart is normal in size.  No pericardial effusion. No evidence of thoracic aortic aneurysm. Mediastinum/Nodes: No suspicious mediastinal lymphadenopathy. Visualized  thyroid is unremarkable. Lungs/Pleura: Tracheostomy in satisfactory position. Mild scarring/atelectasis at the lung bases. No focal consolidation or findings suspicious for aspiration. Trace right pleural effusion. No suspicious pulmonary nodules. No pneumothorax. Musculoskeletal: No focal osseous lesions. CT ABDOMEN PELVIS FINDINGS Limited evaluation due to streak artifact from the patient's arms, poor soft tissue contrast, and motion degradation. Hepatobiliary: Liver is within normal limits. Gallbladder is unremarkable. No intrahepatic or extrahepatic ductal dilatation. Pancreas: Poorly evaluated due to streak artifact but grossly unremarkable. Spleen: Within normal limits. Adrenals/Urinary Tract: Adrenal glands are within normal limits. Bilateral renal cortical atrophy with multiple small cysts, poorly evaluated. No hydronephrosis. Bladder is decompressed and poorly evaluated. Stomach/Bowel: Stomach is notable for a percutaneous gastrostomy. No evidence of bowel obstruction. Appendix is not discretely visualized. Vascular/Lymphatic: No evidence of abdominal aortic aneurysm. No suspicious abdominopelvic lymphadenopathy. Reproductive: Prostate is  unremarkable. Other: No abdominopelvic ascites. Musculoskeletal: Large sacral decubitus ulcer with wound VAC (series 3/image 118). 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 Associated soft tissue gas with fluid extending into the left gluteal region (series 3/image 126). Suspected 4.0 x 2.8 cm low-density lesion anterior to the coccyx (series 3/image 124), poorly visualized. Poor soft tissue contrast.  Possible mild body wall edema. IMPRESSION: No findings suspicious for aspiration. Trace right pleural effusion. Limited evaluation of the abdomen/pelvis due to streak artifact, motion degradation, and poor soft tissue contrast. 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 Small  low-density lesion/fluid collection anterior to the coccyx, poorly evaluated. Tracheostomy and gastrostomy. Electronically Signed   By: Julian Hy M.D.   On: 09/17/2020 04:04     Medications:       Assessment/ Plan:  45 y.o. male with a PMHx of ESRD on HD, posterior reversible encephalopathy syndrome, diabetes mellitus type 2, chronic systolic heart failure, anemia of chronic kidney disease, secondary hyperparathyroidism, malignant hypertension, acute respiratory failure, who was admitted to [facility] on 06/29/2020 for ongoing treatment of acute respiratory failure, posterior reversible encephalopathy syndrome, and end-stage renal disease.   1.  ESRD on HD.  Patient seen and evaluated during hemodialysis treatment.  Tolerating well.  We plan to complete dialysis treatment today.  2.  Anemia of chronic kidney disease.  Hemoglobin appears to be drifting.  Currently hemoglobin down to 7.1.  Continue Retacrit 6000 IV with dialysis but consider blood transfusion for hemoglobin of 7 or less.  3.  Secondary hyperparathyroidism.  Phosphorus at target this a.m. at 4.2.  4.  Acute respiratory failure.  Tracheostomy placed 07/17/2020.  Currently receiving oxygen through tracheostomy.  5.  Hypertension.  Maintain the patient on amlodipine, clonidine, hydralazine, losartan.      LOS: 0 Jeffery Andrews 9/16/20229:41 AM

## 2020-09-19 DIAGNOSIS — J181 Lobar pneumonia, unspecified organism: Secondary | ICD-10-CM | POA: Diagnosis not present

## 2020-09-19 DIAGNOSIS — J9621 Acute and chronic respiratory failure with hypoxia: Secondary | ICD-10-CM | POA: Diagnosis not present

## 2020-09-19 DIAGNOSIS — I5032 Chronic diastolic (congestive) heart failure: Secondary | ICD-10-CM | POA: Diagnosis not present

## 2020-09-19 DIAGNOSIS — N186 End stage renal disease: Secondary | ICD-10-CM | POA: Diagnosis not present

## 2020-09-19 LAB — CBC
HCT: 23.3 % — ABNORMAL LOW (ref 39.0–52.0)
Hemoglobin: 7.1 g/dL — ABNORMAL LOW (ref 13.0–17.0)
MCH: 24.7 pg — ABNORMAL LOW (ref 26.0–34.0)
MCHC: 30.5 g/dL (ref 30.0–36.0)
MCV: 81.2 fL (ref 80.0–100.0)
Platelets: 271 10*3/uL (ref 150–400)
RBC: 2.87 MIL/uL — ABNORMAL LOW (ref 4.22–5.81)
RDW: 18.6 % — ABNORMAL HIGH (ref 11.5–15.5)
WBC: 23 10*3/uL — ABNORMAL HIGH (ref 4.0–10.5)
nRBC: 0 % (ref 0.0–0.2)

## 2020-09-19 NOTE — Progress Notes (Signed)
Pulmonary [place]  PROGRESS NOTE     Jeffery Andrews  [id number]  DOB: 08-18-1975   DOA: 07/17/2020  Referring Physician: Satira Sark, MD  HPI: Jeffery Andrews is a 45 y.o. male being followed for ventilator/airway/oxygen weaning Acute on Chronic Respiratory Failure.  Patient's had a low-grade fever otherwise is resting comfortably aspiration probably occurred when the patient was having bouts of vomiting.  Medications: Reviewed on Rounds  Physical Exam:  Vitals: Temperature is 99.7 pulse 72 respiratory is 15 blood pressure is 138/66 saturations 99%  Ventilator Settings on T collar FiO2 28%  General: Comfortable at this time Neck: supple Cardiovascular: no malignant arrhythmias Respiratory: Good aeration few scattered rhonchi are noted Skin: no rash seen on limited exam Musculoskeletal: No gross abnormality Psychiatric:unable to assess Neurologic:no involuntary movements         Lab Data:   Basic Metabolic Panel: Recent Labs  Lab 09/14/20 0730 09/16/20 0515 09/18/20 0537  NA 132* 131* 134*  134*  K 4.8 4.3 4.1  4.1  CL 92* 91* 95*  95*  CO2 '26 25 26  25  '$ GLUCOSE 159* 220* 140*  137*  BUN 91* 74* 69*  69*  CREATININE 5.15* 4.65* 4.40*  4.34*  CALCIUM 9.9 9.3 9.2  9.2  PHOS 3.5 4.1 4.2    ABG: Recent Labs  Lab 09/17/20 1720  PHART 7.444  PCO2ART 40.4  PO2ART 115*  HCO3 27.2  O2SAT 98.8    Liver Function Tests: Recent Labs  Lab 09/14/20 0730 09/16/20 0515 09/18/20 0537  ALBUMIN 1.8* 1.8* 1.8*   No results for input(s): LIPASE, AMYLASE in the last 168 hours. No results for input(s): AMMONIA in the last 168 hours.  CBC: Recent Labs  Lab 09/14/20 0730 09/16/20 0515 09/17/20 1338 09/18/20 0537 09/19/20 0407  WBC 21.2* 26.2* 27.2* 32.8* 23.0*  NEUTROABS  --   --  25.0* 30.3*  --   HGB 8.9* 8.1* 7.1* 7.1* 7.1*  HCT 28.3* 25.8* 23.9* 23.3*  23.3*  MCV 81.1 80.9 82.1 81.2 81.2  PLT 213 225 253 287 271    Cardiac Enzymes: No results for input(s): CKTOTAL, CKMB, CKMBINDEX, TROPONINI in the last 168 hours.  BNP (last 3 results) No results for input(s): BNP in the last 8760 hours.  ProBNP (last 3 results) No results for input(s): PROBNP in the last 8760 hours.  Radiological Exams: DG Chest 1 View  Result Date: 09/18/2020 CLINICAL DATA:  Aspiration into airway EXAM: CHEST  1 VIEW COMPARISON:  09/14/2020 FINDINGS: Chronic cardiomegaly. Low volume chest with indistinct density at the right base attributed to atelectasis by preceding CT. No visible effusion or pneumothorax. IMPRESSION: Low volume chest with mild atelectasis Electronically Signed   By: Jorje Guild M.D.   On: 09/18/2020 06:45    Assessment/Plan Active Problems:   Acute on chronic respiratory failure with hypoxia (HCC)   End stage renal disease on dialysis (HCC)   Lobar pneumonia, unspecified organism (HCC)   Chronic diastolic CHF (congestive heart failure) (HCC)   Acute on chronic respiratory failure with hypoxia continue with the T collar for now.  Holding off on capping End-stage renal failure supportive care patient's been on dialysis Lobar pneumonia patient likely aspirated once again has been on antibiotics now Chronic diastolic heart failure no change Aspiration vomiting likely aspirated gastric contents supportive care   I have personally seen and evaluated the patient, evaluated laboratory and imaging results, formulated  the assessment and plan and placed orders. The Patient requires high complexity decision making with multiple systems involvement.  Rounds were done with the Respiratory Therapy Director and Staff therapists and discussed with nursing staff also.  Allyne Gee, MD [facility] Pulmonary Critical Care Medicine Sleep Medicine

## 2020-09-20 NOTE — Consult Note (Signed)
Infectious Disease Consultation   Jeffery Andrews  [id number]  DOB: [birth year]  DOA: 07/17/2020  Requesting physician: Dr. Marcille Blanco  Reason for consultation: Antibiotic recommendations   History of Present Illness: Jeffery Andrews is an 45 y.o. male with medical history significant of ESRD on hemodialysis, posterior reversible encephalopathy syndrome, diabetes mellitus type 2, chronic systolic congestive heart failure, hypertension who was initially admitted to [facility] when he presented to the emergency department on 06/22/2020 due to encephalopathy.  He was found to have malignant hypertension and worsening congestive heart failure.  He also had significant electrolyte abnormalities.  Due to his encephalopathy he was intubated.  He also had hyperglycemia without ketoacidosis.  He was treated with insulin drip and dialysis.  He was diagnosed with posterior reversible encephalopathy syndrome.  Due to his complex medical problems he was transferred and admitted to [facility].  After admission here he had respiratory cultures on 08/01/2020 which showed Pseudomonas for which he received treatment with antibiotics.  He is now having worsening leukocytosis, low-grade fever.  His previous chest imaging showed bilateral lower lobe consolidation.  He was treated with meropenem.  Recommended pulmonary toileting.  He improved with treatment with the meropenem and subsequently transitioned to ciprofloxacin which he completed on 09/13/2020.  However, now having worsening leukocytosis again with on and off fevers.  Continues to have increased secretions.  Review of Systems:  Unable to obtain review of systems  Past Medical History: Hypertension, congestive heart failure, diabetes mellitus, end-stage renal disease, anemia encephalopathy  Past Surgical History: Tracheostomy, PEG tube placement  Allergies: No known drug allergies  Social  History: Unable to obtain  Family History: Unable to obtain  Physical Exam: Vitals: Temperature 99.1, pulse 62, respiratory rate 18, blood pressure 154/74, oxygen saturation 100%  Constitutional: Thin, chronically ill-appearing male, opening eyes but not following commands Head: Atraumatic Eyes: PERLA  ENMT: external ears and nose appear normal, Lips appears normal, moist oral mucosa  Neck: Has trach in place CVS: S1-S2   Respiratory: Coarse breath sounds, scattered rhonchi Abdomen: soft nontender, nondistended, normal bowel sounds, PEG tube in place Musculoskeletal: Bilateral upper extremity edema, mild lower extremity edema Neuro: He is not following commands at this time.  Unable to do a neurologic exam. Psych: Unable to assess at this time Skin: no rashes  Data reviewed:  I have personally reviewed following labs and imaging studies Labs:  CBC: Recent Labs  Lab 09/14/20 0730 09/16/20 0515 09/17/20 1338 09/18/20 0537 09/19/20 0407  WBC 21.2* 26.2* 27.2* 32.8* 23.0*  NEUTROABS  --   --  25.0* 30.3*  --   HGB 8.9* 8.1* 7.1* 7.1* 7.1*  HCT 28.3* 25.8* 23.9* 23.3* 23.3*  MCV 81.1 80.9 82.1 81.2 81.2  PLT 213 225 253 287 [id number]    Basic Metabolic Panel: Recent Labs  Lab 09/14/20 0730 09/16/20 0515 09/18/20 0537  NA 132* 131* 134*  134*  K 4.8 4.3 4.1  4.1  CL 92* 91* 95*  95*  CO2 '26 25 26  25  '$ GLUCOSE 159* 220* 140*  137*  BUN 91* 74* 69*  69*  CREATININE 5.15* 4.65* 4.40*  4.34*  CALCIUM 9.9 9.3 9.2  9.2  PHOS 3.5 4.1 4.2   GFR CrCl cannot be calculated (Unknown ideal weight.). Liver Function Tests: Recent Labs  Lab 09/14/20 0730 09/16/20 0515 09/18/20 0537  ALBUMIN 1.8* 1.8* 1.8*   No results for input(s): LIPASE,  AMYLASE in the last 168 hours. No results for input(s): AMMONIA in the last 168 hours. Coagulation profile No results for input(s): INR, PROTIME in the last 168 hours.  Cardiac Enzymes: No results for input(s): CKTOTAL, CKMB,  CKMBINDEX, TROPONINI in the last 168 hours. BNP: Invalid input(s): POCBNP CBG: No results for input(s): GLUCAP in the last 168 hours. D-Dimer No results for input(s): DDIMER in the last 72 hours. Hgb A1c No results for input(s): HGBA1C in the last 72 hours. Lipid Profile No results for input(s): CHOL, HDL, LDLCALC, TRIG, CHOLHDL, LDLDIRECT in the last 72 hours. Thyroid function studies No results for input(s): TSH, T4TOTAL, T3FREE, THYROIDAB in the last 72 hours.  Invalid input(s): FREET3 Anemia work up No results for input(s): VITAMINB12, FOLATE, FERRITIN, TIBC, IRON, RETICCTPCT in the last 72 hours. Urinalysis No results found for: COLORURINE, APPEARANCEUR, [place], [place], [place], [place], [place], [place], PROTEINUR, UROBILINOGEN, NITRITE, LEUKOCYTESUR   Sepsis Labs Invalid input(s): PROCALCITONIN,  WBC,  LACTICIDVEN Microbiology Recent Results (from the past 240 hour(s))  Culture, Respiratory w Gram Stain     Status: None (Preliminary result)   Collection Time: 09/17/20  5:21 PM   Specimen: Tracheal Aspirate; Respiratory  Result Value Ref Range Status   Specimen Description TRACHEAL ASPIRATE  Final   Special Requests NONE  Final   Gram Stain   Final    ABUNDANT WBC PRESENT,BOTH PMN AND MONONUCLEAR MODERATE SQUAMOUS EPITHELIAL CELLS PRESENT MODERATE GRAM POSITIVE COCCI MODERATE GRAM NEGATIVE RODS    Culture   Final    FEW PSEUDOMONAS AERUGINOSA SUSCEPTIBILITIES TO FOLLOW Performed at [facility], [place] [address], [place], [place] [address]    Report Status PENDING  Incomplete  Culture, blood (routine x 2)     Status: None (Preliminary result)   Collection Time: 09/18/20 12:05 PM   Specimen: BLOOD RIGHT ARM  Result Value Ref Range Status   Specimen Description BLOOD RIGHT ARM  Final   Special Requests   Final    BOTTLES DRAWN AEROBIC AND ANAEROBIC Blood Culture adequate volume   Culture   Final    NO GROWTH 2 DAYS Performed at [facility], [place] [address], [place], [place] [address]    Report Status PENDING  Incomplete  Culture, blood (routine x 2)     Status: None (Preliminary result)   Collection Time: 09/18/20 12:05 PM   Specimen: BLOOD RIGHT HAND  Result Value Ref Range Status   Specimen Description BLOOD RIGHT HAND  Final   Special Requests   Final    BOTTLES DRAWN AEROBIC AND ANAEROBIC Blood Culture adequate volume   Culture   Final    NO GROWTH 2 DAYS Performed at [facility], [place] [address], [place], [place] [address]    Report Status PENDING  Incomplete   Inpatient Medications:   Please see MAR  Radiological Exams on Admission: No results found.  Impression/Recommendations Active Problems:   Acute on chronic respiratory failure with hypoxia (HCC) Pneumonia with Pseudomonas aeruginosa Systemic inflammatory response syndrome/leukocytosis Sacrococcygeal pressure ulcer, unstageable with osteomyelitis   End stage renal disease on dialysis  Diabetes mellitus     Chronic diastolic CHF (congestive heart failure) Hypertension Encephalopathy Dysphagia/protein calorie malnutrition  Acute on chronic respiratory failure with hypoxemia: Multifactorial etiology.  He has had pneumonia and received treatment with multiple rounds of antibiotics.  Recent respiratory culture showing Pseudomonas.  Now having worsening leukocytosis along with on and off fevers.  Recommend to start him on empiric IV  vancomycin, meropenem.  Follow-up on the respiratory cultures and adjust antibiotics accordingly.  Please monitor BUN/creatinine closely while on the antibiotics.  Unfortunately continuing to have increased secretions.  Recommend pulmonary toileting.  He unfortunately has dysphagia and also high risk for recurrent aspiration and aspiration pneumonitis despite being on antibiotics.  Pneumonia: Respiratory culture showing Pseudomonas aeruginosa.  Antibiotics and plan as mentioned above.  Follow-up on the final respiratory  cultures and adjust antibiotics accordingly.  As mentioned above, due to his dysphagia and aspiration he is high risk for recurrent aspiration pneumonitis and worsening respiratory failure despite being on antibiotics.  Systemic inflammatory response syndrome/leukocytosis: He has multiple sources including pneumonia, dysphagia with high suspicion for ongoing aspiration, sacrococcygeal pressure ulcer with osteomyelitis.  Antibiotics and plan as mentioned above.  Preliminary blood cultures did not show any growth to date.  Respiratory culture showing Pseudomonas aeruginosa.  Follow-up on the final respiratory cultures and adjust antibiotics accordingly.  Sacrococcygeal pressure ulcer, unstageable with osteomyelitis: He has received treatment with multiple rounds of antibiotics.  Unfortunately due to his debility, inability to offload he is high risk for further skin breakdown and worsening of the pressure ulcers.  Now restarted on antibiotics as mentioned above.  Follow-up on the final cultures and adjust antibiotics accordingly.  If he is having worsening leukocytosis or fevers or worsening of the pressure ulcer then we may need to consider consulting surgery to evaluate for debridement.  End-stage renal disease on dialysis: Antibiotics renally dosed.  Nephrology following.  Dialysis per nephrology.  Diabetes mellitus: Continue to monitor Accu-Cheks, medications and management of diabetes per the primary team.  He will need proper glycemic control in order to enable wound healing.  Congestive heart failure: Management per the primary team.  Encephalopathy: Likely toxic/metabolic.  Antibiotics as mentioned above.  Continue supportive management per primary team.  Hypertension: He had uncontrolled hypertension with hypertensive encephalopathy at the acute facility.  Continue medications and management of hypertension per the primary team.  Dysphagia/protein calorie malnutrition: Due to his dysphagia he  is at risk for ongoing aspiration and recurrent aspiration pneumonitis and pneumonia with worsening respiratory failure.  Management of protein calorie malnutrition per the primary team.  Unfortunately due to his complex medical problems he is high risk for worsening and decompensation.  Plan of care discussed with the primary team.  Thank you for this consultation.    Yaakov Guthrie M.D. 09/20/2020, 11:25 PM

## 2020-09-21 DIAGNOSIS — J181 Lobar pneumonia, unspecified organism: Secondary | ICD-10-CM | POA: Diagnosis not present

## 2020-09-21 DIAGNOSIS — N186 End stage renal disease: Secondary | ICD-10-CM | POA: Diagnosis not present

## 2020-09-21 DIAGNOSIS — I5032 Chronic diastolic (congestive) heart failure: Secondary | ICD-10-CM | POA: Diagnosis not present

## 2020-09-21 DIAGNOSIS — J9621 Acute and chronic respiratory failure with hypoxia: Secondary | ICD-10-CM | POA: Diagnosis not present

## 2020-09-21 LAB — CBC WITH DIFFERENTIAL/PLATELET
Abs Immature Granulocytes: 0.25 10*3/uL — ABNORMAL HIGH (ref 0.00–0.07)
Basophils Absolute: 0.1 10*3/uL (ref 0.0–0.1)
Basophils Relative: 1 %
Eosinophils Absolute: 0.3 10*3/uL (ref 0.0–0.5)
Eosinophils Relative: 3 %
HCT: 23.9 % — ABNORMAL LOW (ref 39.0–52.0)
Hemoglobin: 7.4 g/dL — ABNORMAL LOW (ref 13.0–17.0)
Immature Granulocytes: 2 %
Lymphocytes Relative: 11 %
Lymphs Abs: 1.4 10*3/uL (ref 0.7–4.0)
MCH: 24.3 pg — ABNORMAL LOW (ref 26.0–34.0)
MCHC: 31 g/dL (ref 30.0–36.0)
MCV: 78.6 fL — ABNORMAL LOW (ref 80.0–100.0)
Monocytes Absolute: 0.6 10*3/uL (ref 0.1–1.0)
Monocytes Relative: 5 %
Neutro Abs: 9.8 10*3/uL — ABNORMAL HIGH (ref 1.7–7.7)
Neutrophils Relative %: 78 %
Platelets: 306 10*3/uL (ref 150–400)
RBC: 3.04 MIL/uL — ABNORMAL LOW (ref 4.22–5.81)
RDW: 19.3 % — ABNORMAL HIGH (ref 11.5–15.5)
WBC: 12.4 10*3/uL — ABNORMAL HIGH (ref 4.0–10.5)
nRBC: 0 % (ref 0.0–0.2)

## 2020-09-21 LAB — BASIC METABOLIC PANEL
Anion gap: 15 (ref 5–15)
BUN: 101 mg/dL — ABNORMAL HIGH (ref 6–20)
CO2: 24 mmol/L (ref 22–32)
Calcium: 9 mg/dL (ref 8.9–10.3)
Chloride: 95 mmol/L — ABNORMAL LOW (ref 98–111)
Creatinine, Ser: 4.74 mg/dL — ABNORMAL HIGH (ref 0.61–1.24)
GFR, Estimated: 15 mL/min — ABNORMAL LOW (ref >60)
Glucose, Bld: 90 mg/dL (ref 70–99)
Potassium: 4.3 mmol/L (ref 3.5–5.1)
Sodium: 134 mmol/L — ABNORMAL LOW (ref 135–145)

## 2020-09-21 LAB — VANCOMYCIN, TROUGH: Vancomycin Tr: 11 ug/mL — ABNORMAL LOW (ref 15–20)

## 2020-09-21 NOTE — Progress Notes (Signed)
[facility]  ROUNDING NOTE   Subjective:  Having copious secretions. Tracheostomy in place. Due for hemodialysis treatment today.  Objective:  Vital signs in last 24 hours:  Temperature 97.1 pulse 55 respirations 27 blood pressure 128/73  Physical Exam: General:  No acute distress  Head:  Normocephalic, atraumatic.    Eyes:  Anicteric  Neck:  Tracheostomy in place  Lungs:  Scattered rhonchi, normal effort  Heart:  S1S2 no rubs  Abdomen:   Soft, nontender, bowel sounds present  Extremities:  No peripheral edema in lower extremities  Neurologic:  Bilateral hand contractures  Skin:  No acute skin rash  Access:  Left upper extremity AV fistula, left upper extremity swelling noted    Basic Metabolic Panel: Recent Labs  Lab 09/16/20 0515 09/18/20 0537  NA 131* 134*  134*  K 4.3 4.1  4.1  CL 91* 95*  95*  CO2 '25 26  25  '$ GLUCOSE 220* 140*  137*  BUN 74* 69*  69*  CREATININE 4.65* 4.40*  4.34*  CALCIUM 9.3 9.2  9.2  PHOS 4.1 4.2     Liver Function Tests: Recent Labs  Lab 09/16/20 0515 09/18/20 0537  ALBUMIN 1.8* 1.8*    No results for input(s): LIPASE, AMYLASE in the last 168 hours. No results for input(s): AMMONIA in the last 168 hours.  CBC: Recent Labs  Lab 09/16/20 0515 09/17/20 1338 09/18/20 0537 09/19/20 0407  WBC 26.2* 27.2* 32.8* 23.0*  NEUTROABS  --  25.0* 30.3*  --   HGB 8.1* 7.1* 7.1* 7.1*  HCT 25.8* 23.9* 23.3* 23.3*  MCV 80.9 82.1 81.2 81.2  PLT 225 253 287 271     Cardiac Enzymes: No results for input(s): CKTOTAL, CKMB, CKMBINDEX, TROPONINI in the last 168 hours.  BNP: Invalid input(s): POCBNP  CBG: No results for input(s): GLUCAP in the last 168 hours.  Microbiology: Results for orders placed or performed during the hospital encounter of 07/17/20  Culture, blood (routine x 2)     Status: None   Collection Time: 07/27/20 11:17 AM   Specimen: BLOOD RIGHT ARM  Result Value Ref Range Status   Specimen  Description BLOOD RIGHT ARM  Final   Special Requests   Final    BOTTLES DRAWN AEROBIC ONLY Blood Culture results may not be optimal due to an inadequate volume of blood received in culture bottles   Culture   Final    NO GROWTH 5 DAYS Performed at [facility], [place] [address] [place], [place] [address]    Report Status 08/01/2020 FINAL  Final  Culture, blood (routine x 2)     Status: None   Collection Time: 07/27/20 11:23 AM   Specimen: BLOOD RIGHT HAND  Result Value Ref Range Status   Specimen Description BLOOD RIGHT HAND  Final   Special Requests   Final    BOTTLES DRAWN AEROBIC ONLY Blood Culture results may not be optimal due to an inadequate volume of blood received in culture bottles   Culture   Final    NO GROWTH 5 DAYS Performed at [facility], [place] [address], [place], [place] [address]    Report Status 08/01/2020 FINAL  Final  Culture, blood (routine x 2)     Status: None   Collection Time: 08/01/20  2:25 PM   Specimen: BLOOD RIGHT HAND  Result Value Ref Range Status   Specimen Description BLOOD RIGHT HAND  Final   Special Requests   Final  BOTTLES DRAWN AEROBIC AND ANAEROBIC Blood Culture results may not be optimal due to an inadequate volume of blood received in culture bottles   Culture   Final    NO GROWTH 5 DAYS Performed at [facility], [place] [address] [place], [place] [address]    Report Status 08/06/2020 FINAL  Final  Culture, blood (routine x 2)     Status: None   Collection Time: 08/01/20  2:39 PM   Specimen: BLOOD RIGHT ARM  Result Value Ref Range Status   Specimen Description BLOOD RIGHT ARM  Final   Special Requests   Final    BOTTLES DRAWN AEROBIC ONLY Blood Culture results may not be optimal due to an inadequate volume of blood received in culture bottles   Culture   Final    NO GROWTH 5 DAYS Performed at [facility], [place] [address] [place], [place] [address]    Report Status 08/06/2020 FINAL  Final  Culture,  Respiratory w Gram Stain     Status: None   Collection Time: 08/01/20  2:55 PM   Specimen: Tracheal Aspirate; Respiratory  Result Value Ref Range Status   Specimen Description TRACHEAL ASPIRATE  Final   Special Requests NONE  Final   Gram Stain   Final    FEW WBC PRESENT, PREDOMINANTLY PMN MODERATE GRAM NEGATIVE RODS Performed at [facility], [place] [address], [place], [place] [address]    Culture ABUNDANT PSEUDOMONAS AERUGINOSA  Final   Report Status 08/04/2020 FINAL  Final   Organism ID, Bacteria PSEUDOMONAS AERUGINOSA  Final      Susceptibility   Pseudomonas aeruginosa - MIC*    CEFTAZIDIME <=1 SENSITIVE Sensitive     CIPROFLOXACIN <=0.25 SENSITIVE Sensitive     GENTAMICIN 8 INTERMEDIATE Intermediate     IMIPENEM >=16 RESISTANT Resistant     PIP/TAZO <=4 SENSITIVE Sensitive     CEFEPIME 2 SENSITIVE Sensitive     * ABUNDANT PSEUDOMONAS AERUGINOSA  Culture, Respiratory w Gram Stain     Status: None (Preliminary result)   Collection Time: 09/17/20  5:21 PM   Specimen: Tracheal Aspirate; Respiratory  Result Value Ref Range Status   Specimen Description TRACHEAL ASPIRATE  Final   Special Requests NONE  Final   Gram Stain   Final    ABUNDANT WBC PRESENT,BOTH PMN AND MONONUCLEAR MODERATE SQUAMOUS EPITHELIAL CELLS PRESENT MODERATE GRAM POSITIVE COCCI MODERATE GRAM NEGATIVE RODS    Culture   Final    FEW PSEUDOMONAS AERUGINOSA SUSCEPTIBILITIES TO FOLLOW Performed at [facility], [place] [address], [place], [place] [address]    Report Status PENDING  Incomplete  Culture, blood (routine x 2)     Status: None (Preliminary result)   Collection Time: 09/18/20 12:05 PM   Specimen: BLOOD RIGHT ARM  Result Value Ref Range Status   Specimen Description BLOOD RIGHT ARM  Final   Special Requests   Final    BOTTLES DRAWN AEROBIC AND ANAEROBIC Blood Culture adequate volume   Culture   Final    NO GROWTH 2 DAYS Performed at [facility], [place] [address] [place],  [place] [address]    Report Status PENDING  Incomplete  Culture, blood (routine x 2)     Status: None (Preliminary result)   Collection Time: 09/18/20 12:05 PM   Specimen: BLOOD RIGHT HAND  Result Value Ref Range Status   Specimen Description BLOOD RIGHT HAND  Final   Special Requests   Final  BOTTLES DRAWN AEROBIC AND ANAEROBIC Blood Culture adequate volume   Culture   Final    NO GROWTH 2 DAYS Performed at [facility], [place] [address], [place], [place] [address]    Report Status PENDING  Incomplete    Coagulation Studies: No results for input(s): LABPROT, INR in the last 72 hours.   Urinalysis: No results for input(s): COLORURINE, LABSPEC, PHURINE, GLUCOSEU, HGBUR, BILIRUBINUR, KETONESUR, PROTEINUR, UROBILINOGEN, NITRITE, LEUKOCYTESUR in the last 72 hours.  Invalid input(s): APPERANCEUR    Imaging: No results found.   Medications:       Assessment/ Plan:  45 y.o. male with a PMHx of ESRD on HD, posterior reversible encephalopathy syndrome, diabetes mellitus type 2, chronic systolic heart failure, anemia of chronic kidney disease, secondary hyperparathyroidism, malignant hypertension, acute respiratory failure, who was admitted to [facility] on 06/29/2020 for ongoing treatment of acute respiratory failure, posterior reversible encephalopathy syndrome, and end-stage renal disease.   1.  ESRD on HD.  Patient due for dialysis treatment today per usual schedule.  2.  Anemia of chronic kidney disease.  Hemoglobin low and stable at 7.1.  Consider transfusion for hemoglobin of 7 or less.  3.  Secondary hyperparathyroidism.  Repeat serum phosphorus today.  4.  Acute respiratory failure.  Tracheostomy placed 07/17/2020.  Currently receiving oxygen through tracheostomy.  Tracheostomy now uncapped.  5.  Hypertension.  Maintain the patient on amlodipine, clonidine, hydralazine, losartan.  Blood pressure currently 128/73.    LOS: 0 Jeffery Andrews 9/19/20228:24 AM

## 2020-09-21 NOTE — Progress Notes (Signed)
Jeffery Andrews  PROGRESS NOTE     ANDREAS SOUVA  [id number]  DOB: May 29, 1975   DOA: 07/17/2020  Referring Physician: Satira Sark, MD  HPI: Jeffery Andrews is a 45 y.o. male being followed for ventilator/airway/oxygen weaning Acute on Chronic Respiratory Failure.  Patient at this time is on T collar has been on 28% FiO2 remains at baseline  Medications: Reviewed on Rounds  Physical Exam:  Vitals: Temperature is 97.1 pulse 55 respiratory rate is 29 blood pressure 128/78 saturations 100%  Ventilator Settings on T collar currently on 28% FiO2  General: Comfortable at this time Neck: supple Cardiovascular: no malignant arrhythmias Respiratory: Coarse rhonchi are noted bilaterally Skin: no rash seen on limited exam Musculoskeletal: No gross abnormality Psychiatric:unable to assess Neurologic:no involuntary movements         Lab Data:   Basic Metabolic Panel: Recent Labs  Lab 09/16/20 0515 09/18/20 0537 09/21/20 1052  NA 131* 134*  134* 134*  K 4.3 4.1  4.1 4.3  CL 91* 95*  95* 95*  CO2 '25 26  25 24  '$ GLUCOSE 220* 140*  137* 90  BUN 74* 69*  69* 101*  CREATININE 4.65* 4.40*  4.34* 4.74*  CALCIUM 9.3 9.2  9.2 9.0  PHOS 4.1 4.2  --     ABG: Recent Labs  Lab 09/17/20 1720  PHART 7.444  PCO2ART 40.4  PO2ART 115*  HCO3 27.2  O2SAT 98.8    Liver Function Tests: Recent Labs  Lab 09/16/20 0515 09/18/20 0537  ALBUMIN 1.8* 1.8*   No results for input(s): LIPASE, AMYLASE in the last 168 hours. No results for input(s): AMMONIA in the last 168 hours.  CBC: Recent Labs  Lab 09/16/20 0515 09/17/20 1338 09/18/20 0537 09/19/20 0407 09/21/20 1052  WBC 26.2* 27.2* 32.8* 23.0* 12.4*  NEUTROABS  --  25.0* 30.3*  --  9.8*  HGB 8.1* 7.1* 7.1* 7.1* 7.4*  HCT 25.8* 23.9* 23.3* 23.3* 23.9*  MCV 80.9 82.1 81.2 81.2 78.6*  PLT 225 253 287 271 306    Cardiac  Enzymes: No results for input(s): CKTOTAL, CKMB, CKMBINDEX, TROPONINI in the last 168 hours.  BNP (last 3 results) No results for input(s): BNP in the last 8760 hours.  ProBNP (last 3 results) No results for input(s): PROBNP in the last 8760 hours.  Radiological Exams: No results found.  Assessment/Plan Active Problems:   Acute on chronic respiratory failure with hypoxia (HCC)   End stage renal disease on dialysis (HCC)   Lobar pneumonia, unspecified organism (HCC)   Chronic diastolic CHF (congestive heart failure) (HCC)   Acute on chronic respiratory failure with hypoxia we will continue with T collar trials patient currently is on 28% FiO2 no capping for now until acute infection improves End-stage renal disease patient is on hemodialysis followed by nephrology Lobar pneumonia treated we will continue with supportive care patient is on cefepime and vancomycin consider addition of anaerobic coverage Chronic diastolic heart failure he remains comfortable right now he is agreeable distress no decompensation Tracheostomy revision is in place   I have personally seen and evaluated the patient, evaluated laboratory and imaging results, formulated the assessment and plan and placed orders. The Patient requires high complexity decision making with multiple systems involvement.  Rounds were done with the Respiratory Therapy Director and Staff therapists and discussed with nursing staff also.  Allyne Gee, MD [facility] Sleep Medicine

## 2020-09-22 DIAGNOSIS — I5032 Chronic diastolic (congestive) heart failure: Secondary | ICD-10-CM | POA: Diagnosis not present

## 2020-09-22 DIAGNOSIS — J181 Lobar pneumonia, unspecified organism: Secondary | ICD-10-CM | POA: Diagnosis not present

## 2020-09-22 DIAGNOSIS — J9621 Acute and chronic respiratory failure with hypoxia: Secondary | ICD-10-CM | POA: Diagnosis not present

## 2020-09-22 DIAGNOSIS — N186 End stage renal disease: Secondary | ICD-10-CM | POA: Diagnosis not present

## 2020-09-22 LAB — CULTURE, RESPIRATORY W GRAM STAIN

## 2020-09-22 LAB — VANCOMYCIN, TROUGH: Vancomycin Tr: 16 ug/mL (ref 15–20)

## 2020-09-22 NOTE — Progress Notes (Signed)
[facility] [place]  PROGRESS NOTE     Jeffery Andrews  [id number]  DOB: [birth year]   DOA: 07/17/2020  Referring Physician: Satira Sark, MD  HPI: Jeffery Andrews is a 45 y.o. male being followed for ventilator/airway/oxygen weaning Acute on Chronic Respiratory Failure.  Patient is comfortable right now without distress has been on T collar on 28% FiO2  Medications: Reviewed on Rounds  Physical Exam:  Vitals: Temperature is 98.1 pulse 74 respiratory rate 16 blood pressure is 152/86 saturations 99%  Ventilator Settings off the vent on T collar  General: Comfortable at this time Neck: supple Cardiovascular: no malignant arrhythmias Respiratory: Scattered rhonchi noted Skin: no rash seen on limited exam Musculoskeletal: No gross abnormality Psychiatric:unable to assess Neurologic:no involuntary movements         Lab Data:   Basic Metabolic Panel: Recent Labs  Lab 09/16/20 0515 09/18/20 0537 09/21/20 1052  NA 131* 134*  134* 134*  K 4.3 4.1  4.1 4.3  CL 91* 95*  95* 95*  CO2 '25 26  25 24  '$ GLUCOSE 220* 140*  137* 90  BUN 74* 69*  69* 101*  CREATININE 4.65* 4.40*  4.34* 4.74*  CALCIUM 9.3 9.2  9.2 9.0  PHOS 4.1 4.2  --     ABG: Recent Labs  Lab 09/17/20 1720  PHART 7.444  PCO2ART 40.4  PO2ART 115*  HCO3 27.2  O2SAT 98.8    Liver Function Tests: Recent Labs  Lab 09/16/20 0515 09/18/20 0537  ALBUMIN 1.8* 1.8*   No results for input(s): LIPASE, AMYLASE in the last 168 hours. No results for input(s): AMMONIA in the last 168 hours.  CBC: Recent Labs  Lab 09/16/20 0515 09/17/20 1338 09/18/20 0537 09/19/20 0407 09/21/20 1052  WBC 26.2* 27.2* 32.8* 23.0* 12.4*  NEUTROABS  --  25.0* 30.3*  --  9.8*  HGB 8.1* 7.1* 7.1* 7.1* 7.4*  HCT 25.8* 23.9* 23.3* 23.3* 23.9*  MCV 80.9 82.1 81.2 81.2 78.6*  PLT 225 253 287 271 306    Cardiac Enzymes: No  results for input(s): CKTOTAL, CKMB, CKMBINDEX, TROPONINI in the last 168 hours.  BNP (last 3 results) No results for input(s): BNP in the last 8760 hours.  ProBNP (last 3 results) No results for input(s): PROBNP in the last 8760 hours.  Radiological Exams: No results found.  Assessment/Plan Active Problems:   Acute on chronic respiratory failure with hypoxia (HCC)   End stage renal disease on dialysis (HCC)   Lobar pneumonia, unspecified organism (HCC)   Chronic diastolic CHF (congestive heart failure) (HCC)   Acute on chronic respiratory failure with hypoxia continue with T-piece.  Titrate oxygen as tolerated continue secretion management and supportive care. End-stage renal failure on hemodialysis we will continue to follow along closely Level pneumonia treated Chronic diastolic heart failure compensated Tracheostomy will remain in place   I have personally seen and evaluated the patient, evaluated laboratory and imaging results, formulated the assessment and plan and placed orders. The Patient requires high complexity decision making with multiple systems involvement.  Rounds were done with the Respiratory Therapy Director and Staff therapists and discussed with nursing staff also.  Allyne Gee, MD [facility] Pulmonary Critical Care Medicine Sleep Medicine

## 2020-09-23 DIAGNOSIS — J181 Lobar pneumonia, unspecified organism: Secondary | ICD-10-CM | POA: Diagnosis not present

## 2020-09-23 DIAGNOSIS — J9621 Acute and chronic respiratory failure with hypoxia: Secondary | ICD-10-CM | POA: Diagnosis not present

## 2020-09-23 DIAGNOSIS — N186 End stage renal disease: Secondary | ICD-10-CM | POA: Diagnosis not present

## 2020-09-23 DIAGNOSIS — I5032 Chronic diastolic (congestive) heart failure: Secondary | ICD-10-CM | POA: Diagnosis not present

## 2020-09-23 LAB — CULTURE, BLOOD (ROUTINE X 2)
Culture: NO GROWTH
Culture: NO GROWTH
Special Requests: ADEQUATE
Special Requests: ADEQUATE

## 2020-09-23 LAB — RENAL FUNCTION PANEL
Albumin: 1.5 g/dL — ABNORMAL LOW (ref 3.5–5.0)
Anion gap: 14 (ref 5–15)
BUN: 77 mg/dL — ABNORMAL HIGH (ref 6–20)
CO2: 25 mmol/L (ref 22–32)
Calcium: 9 mg/dL (ref 8.9–10.3)
Chloride: 95 mmol/L — ABNORMAL LOW (ref 98–111)
Creatinine, Ser: 3.87 mg/dL — ABNORMAL HIGH (ref 0.61–1.24)
GFR, Estimated: 19 mL/min — ABNORMAL LOW (ref >60)
Glucose, Bld: 156 mg/dL — ABNORMAL HIGH (ref 70–99)
Phosphorus: 3.1 mg/dL (ref 2.5–4.6)
Potassium: 4.4 mmol/L (ref 3.5–5.1)
Sodium: 134 mmol/L — ABNORMAL LOW (ref 135–145)

## 2020-09-23 LAB — CBC
HCT: 23.6 % — ABNORMAL LOW (ref 39.0–52.0)
Hemoglobin: 7.1 g/dL — ABNORMAL LOW (ref 13.0–17.0)
MCH: 23.9 pg — ABNORMAL LOW (ref 26.0–34.0)
MCHC: 30.1 g/dL (ref 30.0–36.0)
MCV: 79.5 fL — ABNORMAL LOW (ref 80.0–100.0)
Platelets: 324 10*3/uL (ref 150–400)
RBC: 2.97 MIL/uL — ABNORMAL LOW (ref 4.22–5.81)
RDW: 19.2 % — ABNORMAL HIGH (ref 11.5–15.5)
WBC: 10.1 10*3/uL (ref 4.0–10.5)
nRBC: 0 % (ref 0.0–0.2)

## 2020-09-23 NOTE — Progress Notes (Signed)
[facility]  ROUNDING NOTE   Subjective:  Patient due for hemodialysis treatment today. Currently on T-piece with his tracheostomy.  Objective:  Vital signs in last 24 hours:  Temperature 97.1 pulse 64 respirations 30 blood pressure 170/94  Physical Exam: General:  No acute distress  Head:  Normocephalic, atraumatic.    Eyes:  Anicteric  Neck:  Tracheostomy in place  Lungs:   Scattered rhonchi, normal effort  Heart:  S1S2 no rubs  Abdomen:   Soft, nontender, bowel sounds present  Extremities:  No peripheral edema in lower extremities  Neurologic:  Bilateral hand contractures  Skin:  No acute skin rash  Access:  Left upper extremity AV fistula, left upper extremity swelling noted    Basic Metabolic Panel: Recent Labs  Lab 09/18/20 0537 09/21/20 1052 09/23/20 0443  NA 134*  134* 134* 134*  K 4.1  4.1 4.3 4.4  CL 95*  95* 95* 95*  CO2 '26  25 24 25  '$ GLUCOSE 140*  137* 90 156*  BUN 69*  69* 101* 77*  CREATININE 4.40*  4.34* 4.74* 3.87*  CALCIUM 9.2  9.2 9.0 9.0  PHOS 4.2  --  3.1     Liver Function Tests: Recent Labs  Lab 09/18/20 0537 09/23/20 0443  ALBUMIN 1.8* 1.5*    No results for input(s): LIPASE, AMYLASE in the last 168 hours. No results for input(s): AMMONIA in the last 168 hours.  CBC: Recent Labs  Lab 09/17/20 1338 09/18/20 0537 09/19/20 0407 09/21/20 1052 09/23/20 0443  WBC 27.2* 32.8* 23.0* 12.4* 10.1  NEUTROABS 25.0* 30.3*  --  9.8*  --   HGB 7.1* 7.1* 7.1* 7.4* 7.1*  HCT 23.9* 23.3* 23.3* 23.9* 23.6*  MCV 82.1 81.2 81.2 78.6* 79.5*  PLT 253 287 271 306 324     Cardiac Enzymes: No results for input(s): CKTOTAL, CKMB, CKMBINDEX, TROPONINI in the last 168 hours.  BNP: Invalid input(s): POCBNP  CBG: No results for input(s): GLUCAP in the last 168 hours.  Microbiology: Results for orders placed or performed during the hospital encounter of 07/17/20  Culture, blood (routine x 2)     Status: None   Collection  Time: 07/27/20 11:17 AM   Specimen: BLOOD RIGHT ARM  Result Value Ref Range Status   Specimen Description BLOOD RIGHT ARM  Final   Special Requests   Final    BOTTLES DRAWN AEROBIC ONLY Blood Culture results may not be optimal due to an inadequate volume of blood received in culture bottles   Culture   Final    NO GROWTH 5 DAYS Performed at [facility], [place] [address] [place], [place] [address]    Report Status 08/01/2020 FINAL  Final  Culture, blood (routine x 2)     Status: None   Collection Time: 07/27/20 11:23 AM   Specimen: BLOOD RIGHT HAND  Result Value Ref Range Status   Specimen Description BLOOD RIGHT HAND  Final   Special Requests   Final    BOTTLES DRAWN AEROBIC ONLY Blood Culture results may not be optimal due to an inadequate volume of blood received in culture bottles   Culture   Final    NO GROWTH 5 DAYS Performed at [facility], [place] [address], [place], [place] [address]    Report Status 08/01/2020 FINAL  Final  Culture, blood (routine x 2)     Status: None   Collection Time: 08/01/20  2:25 PM   Specimen: BLOOD RIGHT HAND  Result Value Ref Range Status   Specimen Description BLOOD RIGHT HAND  Final   Special Requests   Final    BOTTLES DRAWN AEROBIC AND ANAEROBIC Blood Culture results may not be optimal due to an inadequate volume of blood received in culture bottles   Culture   Final    NO GROWTH 5 DAYS Performed at [place] [address] [place], [place] [address]    Report Status 08/06/2020 FINAL  Final  Culture, blood (routine x 2)     Status: None   Collection Time: 08/01/20  2:39 PM   Specimen: BLOOD RIGHT ARM  Result Value Ref Range Status   Specimen Description BLOOD RIGHT ARM  Final   Special Requests   Final    BOTTLES DRAWN AEROBIC ONLY Blood Culture results may not be optimal due to an inadequate volume of blood received in culture bottles   Culture   Final    NO GROWTH 5 DAYS Performed at [facility],  [place] [address], [place], [place] [address]    Report Status 08/06/2020 FINAL  Final  Culture, Respiratory w Gram Stain     Status: None   Collection Time: 08/01/20  2:55 PM   Specimen: Tracheal Aspirate; Respiratory  Result Value Ref Range Status   Specimen Description TRACHEAL ASPIRATE  Final   Special Requests NONE  Final   Gram Stain   Final    FEW WBC PRESENT, PREDOMINANTLY PMN MODERATE GRAM NEGATIVE RODS Performed at [facility], [place] [address] [place], [place] [address]    Culture ABUNDANT PSEUDOMONAS AERUGINOSA  Final   Report Status 08/04/2020 FINAL  Final   Organism ID, Bacteria PSEUDOMONAS AERUGINOSA  Final      Susceptibility   Pseudomonas aeruginosa - MIC*    CEFTAZIDIME <=1 SENSITIVE Sensitive     CIPROFLOXACIN <=0.25 SENSITIVE Sensitive     GENTAMICIN 8 INTERMEDIATE Intermediate     IMIPENEM >=16 RESISTANT Resistant     PIP/TAZO <=4 SENSITIVE Sensitive     CEFEPIME 2 SENSITIVE Sensitive     * ABUNDANT PSEUDOMONAS AERUGINOSA  Culture, Respiratory w Gram Stain     Status: None   Collection Time: 09/17/20  5:21 PM   Specimen: Tracheal Aspirate; Respiratory  Result Value Ref Range Status   Specimen Description TRACHEAL ASPIRATE  Final   Special Requests NONE  Final   Gram Stain   Final    ABUNDANT WBC PRESENT,BOTH PMN AND MONONUCLEAR MODERATE SQUAMOUS EPITHELIAL CELLS PRESENT MODERATE GRAM POSITIVE COCCI MODERATE GRAM NEGATIVE RODS Performed at [facility], [place] [address] [place], [place] [address]    Culture FEW PSEUDOMONAS AERUGINOSA  Final   Report Status 09/22/2020 FINAL  Final   Organism ID, Bacteria PSEUDOMONAS AERUGINOSA  Final      Susceptibility   Pseudomonas aeruginosa - MIC*    CEFTAZIDIME 2 SENSITIVE Sensitive     CIPROFLOXACIN 1 SENSITIVE Sensitive     GENTAMICIN >=16 RESISTANT Resistant     IMIPENEM >=16 RESISTANT Resistant     PIP/TAZO <=4 SENSITIVE Sensitive     * FEW PSEUDOMONAS AERUGINOSA  Culture, blood (routine x 2)      Status: None (Preliminary result)   Collection Time: 09/18/20 12:05 PM   Specimen: BLOOD RIGHT ARM  Result Value Ref Range Status   Specimen Description BLOOD RIGHT ARM  Final   Special Requests   Final    BOTTLES DRAWN AEROBIC AND ANAEROBIC Blood Culture adequate volume  Culture   Final    NO GROWTH 4 DAYS Performed at [facility], [place] [address], [place], [place] [address]    Report Status PENDING  Incomplete  Culture, blood (routine x 2)     Status: None (Preliminary result)   Collection Time: 09/18/20 12:05 PM   Specimen: BLOOD RIGHT HAND  Result Value Ref Range Status   Specimen Description BLOOD RIGHT HAND  Final   Special Requests   Final    BOTTLES DRAWN AEROBIC AND ANAEROBIC Blood Culture adequate volume   Culture   Final    NO GROWTH 4 DAYS Performed at [facility], [place] [address] [place], [place] [address]    Report Status PENDING  Incomplete    Coagulation Studies: No results for input(s): LABPROT, INR in the last 72 hours.   Urinalysis: No results for input(s): COLORURINE, LABSPEC, PHURINE, GLUCOSEU, HGBUR, BILIRUBINUR, KETONESUR, PROTEINUR, UROBILINOGEN, NITRITE, LEUKOCYTESUR in the last 72 hours.  Invalid input(s): APPERANCEUR    Imaging: No results found.   Medications:       Assessment/ Plan:  45 y.o. male with a PMHx of ESRD on HD, posterior reversible encephalopathy syndrome, diabetes mellitus type 2, chronic systolic heart failure, anemia of chronic kidney disease, secondary hyperparathyroidism, malignant hypertension, acute respiratory failure, who was admitted to [facility] on 06/29/2020 for ongoing treatment of acute respiratory failure, posterior reversible encephalopathy syndrome, and end-stage renal disease.   1.  ESRD on HD.  We will maintain the patient on MWF dialysis schedule.  Therefore he is due for dialysis treatment today.    2.  Anemia of chronic kidney disease.  Hemoglobin remains 7.1.  Continue to monitor  closely.  3.  Secondary hyperparathyroidism.  Phosphorus 3.1 and acceptable.  4.  Acute respiratory failure.  Tracheostomy placed 07/17/2020.  Currently transition to T-piece.  5.  Hypertension.  Maintain the patient on amlodipine, clonidine, hydralazine, losartan.  Blood pressure higher this a.m. at 170/94.    LOS: 0 Tauri Ethington 9/21/20228:05 AM

## 2020-09-23 NOTE — Progress Notes (Signed)
Pulmonary [place]  PROGRESS NOTE     Jeffery Andrews  [id number]  DOB: Nov 21, 1975   DOA: 07/17/2020  Referring Physician: Satira Sark, MD  HPI: Jeffery Andrews is a 45 y.o. male being followed for ventilator/airway/oxygen weaning Acute on Chronic Respiratory Failure.  Comfortable right now without distress patient is on the T collar secretions are still significant  Medications: Reviewed on Rounds  Physical Exam:  Vitals: Temperature was 97.3 pulse 64 respiratory 30 blood pressure is 170/94 saturations 100%  Ventilator Settings off the ventilator on T collar at this time  General: Comfortable at this time Neck: supple Cardiovascular: no malignant arrhythmias Respiratory: No rhonchi no rales Skin: no rash seen on limited exam Musculoskeletal: No gross abnormality Psychiatric:unable to assess Neurologic:no involuntary movements         Lab Data:   Basic Metabolic Panel: Recent Labs  Lab 09/18/20 0537 09/21/20 1052 09/23/20 0443  NA 134*  134* 134* 134*  K 4.1  4.1 4.3 4.4  CL 95*  95* 95* 95*  CO2 '26  25 24 25  '$ GLUCOSE 140*  137* 90 156*  BUN 69*  69* 101* 77*  CREATININE 4.40*  4.34* 4.74* 3.87*  CALCIUM 9.2  9.2 9.0 9.0  PHOS 4.2  --  3.1    ABG: Recent Labs  Lab 09/17/20 1720  PHART 7.444  PCO2ART 40.4  PO2ART 115*  HCO3 27.2  O2SAT 98.8    Liver Function Tests: Recent Labs  Lab 09/18/20 0537 09/23/20 0443  ALBUMIN 1.8* 1.5*   No results for input(s): LIPASE, AMYLASE in the last 168 hours. No results for input(s): AMMONIA in the last 168 hours.  CBC: Recent Labs  Lab 09/17/20 1338 09/18/20 0537 09/19/20 0407 09/21/20 1052 09/23/20 0443  WBC 27.2* 32.8* 23.0* 12.4* 10.1  NEUTROABS 25.0* 30.3*  --  9.8*  --   HGB 7.1* 7.1* 7.1* 7.4* 7.1*  HCT 23.9* 23.3* 23.3* 23.9* 23.6*  MCV 82.1 81.2 81.2 78.6* 79.5*  PLT 253 287 271 306 324     Cardiac Enzymes: No results for input(s): CKTOTAL, CKMB, CKMBINDEX, TROPONINI in the last 168 hours.  BNP (last 3 results) No results for input(s): BNP in the last 8760 hours.  ProBNP (last 3 results) No results for input(s): PROBNP in the last 8760 hours.  Radiological Exams: No results found.  Assessment/Plan Active Problems:   Acute on chronic respiratory failure with hypoxia (HCC)   End stage renal disease on dialysis (HCC)   Lobar pneumonia, unspecified organism (HCC)   Chronic diastolic CHF (congestive heart failure) (HCC)   Acute on chronic respiratory failure hypoxia we will continue with T collar holding off on capping until acute issues are resolved End-stage renal failure on hemodialysis we will continue present management supportive care Lobar pneumonia treated we will continue present therapy Chronic diastolic heart failure compensated Tracheostomy remains in place   I have personally seen and evaluated the patient, evaluated laboratory and imaging results, formulated the assessment and plan and placed orders. The Patient requires high complexity decision making with multiple systems involvement.  Rounds were done with the Respiratory Therapy Director and Staff therapists and discussed with nursing staff also.  Allyne Gee, MD [facility] Pulmonary Critical Care Medicine Sleep Medicine

## 2020-09-24 DIAGNOSIS — I5032 Chronic diastolic (congestive) heart failure: Secondary | ICD-10-CM | POA: Diagnosis not present

## 2020-09-24 DIAGNOSIS — J181 Lobar pneumonia, unspecified organism: Secondary | ICD-10-CM | POA: Diagnosis not present

## 2020-09-24 DIAGNOSIS — N186 End stage renal disease: Secondary | ICD-10-CM | POA: Diagnosis not present

## 2020-09-24 DIAGNOSIS — J9621 Acute and chronic respiratory failure with hypoxia: Secondary | ICD-10-CM | POA: Diagnosis not present

## 2020-09-24 NOTE — Progress Notes (Signed)
PROGRESS NOTE    JVONTE BERRIAN  [id number] DOB: 07-23-1975 DOA: 07/17/2020  Brief Narrative:  [name] is an 45 y.o. male with medical history significant of ESRD on hemodialysis, posterior reversible encephalopathy syndrome, diabetes mellitus type 2, chronic systolic congestive heart failure, hypertension who was initially admitted to [facility] when he presented to the emergency department on 06/22/2020 due to encephalopathy.  He was found to have malignant hypertension and worsening congestive heart failure.  He also had significant electrolyte abnormalities.  Due to his encephalopathy he was intubated.  He also had hyperglycemia without ketoacidosis.  He was treated with insulin drip and dialysis.  He was diagnosed with posterior reversible encephalopathy syndrome.  Due to his complex medical problems he was transferred and admitted to [facility].  After admission here he had respiratory cultures on 08/01/2020 which showed Pseudomonas for which he received treatment with antibiotics.  He is now having worsening leukocytosis, low-grade fever.  His previous chest imaging showed bilateral lower lobe consolidation.  He received treatment with multiple rounds of antibiotics.  Unfortunately continues to have increased secretions with on and off fever and leukocytosis.  Assessment & Plan:  Active Problems:   Acute on chronic respiratory failure with hypoxia (HCC) Pneumonia with Pseudomonas aeruginosa Systemic inflammatory response syndrome/leukocytosis Sacrococcygeal pressure ulcer, unstageable with osteomyelitis, draining sinus   End stage renal disease on dialysis  Diabetes mellitus     Chronic diastolic CHF (congestive heart failure) Hypertension Encephalopathy Dysphagia/protein calorie malnutrition   Acute on chronic respiratory failure with hypoxemia: Multifactorial etiology.  He has had pneumonia and received treatment with multiple  rounds of antibiotics.  Recent respiratory culture showing Pseudomonas.  Having worsening leukocytosis along with on and off fevers.  Recommended to start him on empiric IV vancomycin, meropenem.  The Pseudomonas unfortunately is resistant to carbapenems.  Based on the susceptibility results recommended to discontinue the meropenem and switch him to cefepime.  Also recommended to add Flagyl for anaerobic coverage.  Plan to treat for duration of 2 weeks and repeat imaging in 2 weeks.  Please monitor BUN/creatinine closely while on the antibiotics.  Unfortunately continuing to have increased secretions.  Recommend pulmonary toileting.  He unfortunately has dysphagia and also high risk for recurrent aspiration and aspiration pneumonitis despite being on antibiotics.   Pneumonia: Respiratory culture showed Pseudomonas aeruginosa.  Antibiotics and plan as mentioned above. As mentioned above, due to his dysphagia and aspiration he is high risk for recurrent aspiration pneumonitis and worsening respiratory failure despite being on antibiotics.   Systemic inflammatory response syndrome/leukocytosis: He has multiple sources including pneumonia, dysphagia with high suspicion for ongoing aspiration, sacrococcygeal pressure ulcer with osteomyelitis.  Antibiotics and plan as mentioned above.  Blood cultures did not show any growth to date.  Respiratory culture showing Pseudomonas aeruginosa which unfortunately is resistant to Carbapenem.  Therefore recommended to switch to cefepime.  Also add Flagyl for anaerobic coverage.    Sacrococcygeal pressure ulcer, unstageable with osteomyelitis: He has received treatment with multiple rounds of antibiotics.  Unfortunately due to his debility, inability to offload he is high risk for further skin breakdown and worsening of the pressure ulcers.  To continue treatment with IV vancomycin, cefepime, Flagyl for duration of 2 weeks.  After 2 weeks suggest to repeat CT of the abdomen and  pelvis.  In the meantime, if he is having worsening leukocytosis or fevers or worsening of the pressure ulcer then we may need to consider consulting surgery to  evaluate for debridement.   End-stage renal disease on dialysis: Antibiotics renally dosed.  Nephrology following.  Dialysis per nephrology.   Diabetes mellitus: Continue to monitor Accu-Cheks, medications and management of diabetes per the primary team.  He will need proper glycemic control in order to enable wound healing.   Congestive heart failure: Management per the primary team.   Encephalopathy: Likely toxic/metabolic.  Antibiotics as mentioned above.  Continue supportive management per primary team.   Hypertension: He had uncontrolled hypertension with hypertensive encephalopathy at the acute facility.  Continue medications and management of hypertension per the primary team.   Dysphagia/protein calorie malnutrition: Due to his dysphagia he is at risk for ongoing aspiration and recurrent aspiration pneumonitis and pneumonia with worsening respiratory failure.  Management of protein calorie malnutrition per the primary team.   Unfortunately due to his complex medical problems he is high risk for worsening and decompensation.  Plan of care discussed with the primary team.   Subjective: He is having on and off fevers with leukocytosis.  Unfortunately remains encephalopathic.  Objective: Vitals: Temperature 98.2, pulse 72, respiratory rate 28, blood pressure 134/92, oxygen saturation 98%  Examination: Constitutional: chronically ill-appearing male, opening eyes but not following commands Head: Atraumatic Eyes: PERLA  ENMT: external ears and nose appear normal, Lips appears normal, moist oral mucosa  Neck: Has trach in place CVS: S1-S2   Respiratory: Coarse breath sounds, scattered rhonchi Abdomen: soft nontender, nondistended, normal bowel sounds, PEG tube in place Musculoskeletal: Bilateral upper extremity edema, mild  lower extremity edema Neuro: He is not following commands at this time.  Unable to do a neurologic exam. Psych: Unable to assess at this time Skin: no rashes    Data Reviewed: I have personally reviewed following labs and imaging studies  CBC: Recent Labs  Lab 09/18/20 0537 09/19/20 0407 09/21/20 1052 09/23/20 0443  WBC 32.8* 23.0* 12.4* 10.1  NEUTROABS 30.3*  --  9.8*  --   HGB 7.1* 7.1* 7.4* 7.1*  HCT 23.3* 23.3* 23.9* 23.6*  MCV 81.2 81.2 78.6* 79.5*  PLT 287 271 306 0000000    Basic Metabolic Panel: Recent Labs  Lab 09/18/20 0537 09/21/20 1052 09/23/20 0443  NA 134*  134* 134* 134*  K 4.1  4.1 4.3 4.4  CL 95*  95* 95* 95*  CO2 '26  25 24 25  '$ GLUCOSE 140*  137* 90 156*  BUN 69*  69* 101* 77*  CREATININE 4.40*  4.34* 4.74* 3.87*  CALCIUM 9.2  9.2 9.0 9.0  PHOS 4.2  --  3.1    GFR: CrCl cannot be calculated (Unknown ideal weight.).  Liver Function Tests: Recent Labs  Lab 09/18/20 0537 09/23/20 0443  ALBUMIN 1.8* 1.5*    CBG: No results for input(s): GLUCAP in the last 168 hours.   Recent Results (from the past 240 hour(s))  Culture, Respiratory w Gram Stain     Status: None   Collection Time: 09/17/20  5:21 PM   Specimen: Tracheal Aspirate; Respiratory  Result Value Ref Range Status   Specimen Description TRACHEAL ASPIRATE  Final   Special Requests NONE  Final   Gram Stain   Final    ABUNDANT WBC PRESENT,BOTH PMN AND MONONUCLEAR MODERATE SQUAMOUS EPITHELIAL CELLS PRESENT MODERATE GRAM POSITIVE COCCI MODERATE GRAM NEGATIVE RODS Performed at [facility], [place] [address] [place], [place] [address]    Culture FEW PSEUDOMONAS AERUGINOSA  Final   Report Status 09/22/2020 FINAL  Final   Organism ID, Bacteria PSEUDOMONAS AERUGINOSA  Final      Susceptibility   Pseudomonas aeruginosa - MIC*    CEFTAZIDIME 2 SENSITIVE Sensitive     CIPROFLOXACIN 1 SENSITIVE Sensitive     GENTAMICIN >=16 RESISTANT Resistant     IMIPENEM >=16 RESISTANT  Resistant     PIP/TAZO <=4 SENSITIVE Sensitive     * FEW PSEUDOMONAS AERUGINOSA  Culture, blood (routine x 2)     Status: None   Collection Time: 09/18/20 12:05 PM   Specimen: BLOOD RIGHT ARM  Result Value Ref Range Status   Specimen Description BLOOD RIGHT ARM  Final   Special Requests   Final    BOTTLES DRAWN AEROBIC AND ANAEROBIC Blood Culture adequate volume   Culture   Final    NO GROWTH 5 DAYS Performed at [facility], [address] [place], [place] [address]    Report Status 09/23/2020 FINAL  Final  Culture, blood (routine x 2)     Status: None   Collection Time: 09/18/20 12:05 PM   Specimen: BLOOD RIGHT HAND  Result Value Ref Range Status   Specimen Description BLOOD RIGHT HAND  Final   Special Requests   Final    BOTTLES DRAWN AEROBIC AND ANAEROBIC Blood Culture adequate volume   Culture   Final    NO GROWTH 5 DAYS Performed at [facility], [place] [address], [place], [place] [address]    Report Status 09/23/2020 FINAL  Final      Radiology Studies: No results found.   Scheduled Meds: Please see MAR    Yaakov Guthrie, MD 09/24/2020, 6:45 PM

## 2020-09-24 NOTE — Progress Notes (Signed)
Pulmonary [place]  PROGRESS NOTE     NOBLE HARDMAN  [id number]  DOB: 04-25-1975   DOA: 07/17/2020  Referring Physician: Satira Sark, MD  HPI: MERCER RUSSAW is a 45 y.o. male being followed for ventilator/airway/oxygen weaning Acute on Chronic Respiratory Failure.  Currently is on T collar patient was sitting up in the chair still with copious secretions  Medications: Reviewed on Rounds  Physical Exam:  Vitals: Temperature is 98.2 pulse 72 respiratory 20 blood pressure is 134/92 saturations 100%  Ventilator Settings on T collar  General: Comfortable at this time Neck: supple Cardiovascular: no malignant arrhythmias Respiratory: No rhonchi very coarse breath sounds Skin: no rash seen on limited exam Musculoskeletal: No gross abnormality Psychiatric:unable to assess Neurologic:no involuntary movements         Lab Data:   Basic Metabolic Panel: Recent Labs  Lab 09/18/20 0537 09/21/20 1052 09/23/20 0443  NA 134*  134* 134* 134*  K 4.1  4.1 4.3 4.4  CL 95*  95* 95* 95*  CO2 '26  25 24 25  '$ GLUCOSE 140*  137* 90 156*  BUN 69*  69* 101* 77*  CREATININE 4.40*  4.34* 4.74* 3.87*  CALCIUM 9.2  9.2 9.0 9.0  PHOS 4.2  --  3.1    ABG: Recent Labs  Lab 09/17/20 1720  PHART 7.444  PCO2ART 40.4  PO2ART 115*  HCO3 27.2  O2SAT 98.8    Liver Function Tests: Recent Labs  Lab 09/18/20 0537 09/23/20 0443  ALBUMIN 1.8* 1.5*   No results for input(s): LIPASE, AMYLASE in the last 168 hours. No results for input(s): AMMONIA in the last 168 hours.  CBC: Recent Labs  Lab 09/17/20 1338 09/18/20 0537 09/19/20 0407 09/21/20 1052 09/23/20 0443  WBC 27.2* 32.8* 23.0* 12.4* 10.1  NEUTROABS 25.0* 30.3*  --  9.8*  --   HGB 7.1* 7.1* 7.1* 7.4* 7.1*  HCT 23.9* 23.3* 23.3* 23.9* 23.6*  MCV 82.1 81.2 81.2 78.6* 79.5*  PLT 253 287 271 306 324    Cardiac Enzymes: No  results for input(s): CKTOTAL, CKMB, CKMBINDEX, TROPONINI in the last 168 hours.  BNP (last 3 results) No results for input(s): BNP in the last 8760 hours.  ProBNP (last 3 results) No results for input(s): PROBNP in the last 8760 hours.  Radiological Exams: No results found.  Assessment/Plan Active Problems:   Acute on chronic respiratory failure with hypoxia (HCC)   End stage renal disease on dialysis (HCC)   Lobar pneumonia, unspecified organism (HCC)   Chronic diastolic CHF (congestive heart failure) (HCC)   Acute on chronic respiratory failure hypoxia plan is going to be to continue with the T-piece as ordered titrate oxygen as tolerated continue pulmonary toilet End-stage renal failure patient is on dialysis we will continue to monitor Lobar pneumonia treated continue with supportive care Chronic diastolic heart failure patient is at baseline right now compensated Tracheostomy remains in place   I have personally seen and evaluated the patient, evaluated laboratory and imaging results, formulated the assessment and plan and placed orders. The Patient requires high complexity decision making with multiple systems involvement.  Rounds were done with the Respiratory Therapy Director and Staff therapists and discussed with nursing staff also.  Allyne Gee, MD [facility] Pulmonary Critical Care Medicine Sleep Medicine

## 2020-09-25 DIAGNOSIS — I5032 Chronic diastolic (congestive) heart failure: Secondary | ICD-10-CM | POA: Diagnosis not present

## 2020-09-25 DIAGNOSIS — J9621 Acute and chronic respiratory failure with hypoxia: Secondary | ICD-10-CM | POA: Diagnosis not present

## 2020-09-25 DIAGNOSIS — J181 Lobar pneumonia, unspecified organism: Secondary | ICD-10-CM | POA: Diagnosis not present

## 2020-09-25 DIAGNOSIS — N186 End stage renal disease: Secondary | ICD-10-CM | POA: Diagnosis not present

## 2020-09-25 LAB — CBC
HCT: 24.3 % — ABNORMAL LOW (ref 39.0–52.0)
Hemoglobin: 7.5 g/dL — ABNORMAL LOW (ref 13.0–17.0)
MCH: 24.5 pg — ABNORMAL LOW (ref 26.0–34.0)
MCHC: 30.9 g/dL (ref 30.0–36.0)
MCV: 79.4 fL — ABNORMAL LOW (ref 80.0–100.0)
Platelets: 313 10*3/uL (ref 150–400)
RBC: 3.06 MIL/uL — ABNORMAL LOW (ref 4.22–5.81)
RDW: 20 % — ABNORMAL HIGH (ref 11.5–15.5)
WBC: 10.3 10*3/uL (ref 4.0–10.5)
nRBC: 0.3 % — ABNORMAL HIGH (ref 0.0–0.2)

## 2020-09-25 LAB — RENAL FUNCTION PANEL
Albumin: 1.6 g/dL — ABNORMAL LOW (ref 3.5–5.0)
Anion gap: 14 (ref 5–15)
BUN: 71 mg/dL — ABNORMAL HIGH (ref 6–20)
CO2: 27 mmol/L (ref 22–32)
Calcium: 9.1 mg/dL (ref 8.9–10.3)
Chloride: 93 mmol/L — ABNORMAL LOW (ref 98–111)
Creatinine, Ser: 3.58 mg/dL — ABNORMAL HIGH (ref 0.61–1.24)
GFR, Estimated: 20 mL/min — ABNORMAL LOW (ref >60)
Glucose, Bld: 211 mg/dL — ABNORMAL HIGH (ref 70–99)
Phosphorus: 2.6 mg/dL (ref 2.5–4.6)
Potassium: 4.9 mmol/L (ref 3.5–5.1)
Sodium: 134 mmol/L — ABNORMAL LOW (ref 135–145)

## 2020-09-25 LAB — VANCOMYCIN, TROUGH: Vancomycin Tr: 13 ug/mL — ABNORMAL LOW (ref 15–20)

## 2020-09-25 NOTE — Progress Notes (Signed)
Pulmonary [place]  PROGRESS NOTE     Jeffery Andrews  [id number]  DOB: [birth year]   DOA: 07/17/2020  Referring Physician: Satira Sark, MD  HPI: Jeffery Andrews is a 45 y.o. male being followed for ventilator/airway/oxygen weaning Acute on Chronic Respiratory Failure.  Patient is comfortable right now without distress has been on T collar good saturations are noted  Medications: Reviewed on Rounds  Physical Exam:  Vitals: Temperature 99.8 pulse 70 respiratory rate is 19 blood pressure 162/85 saturations 100%  Ventilator Settings patient currently is on T collar on 28% FiO2  General: Comfortable at this time Neck: supple Cardiovascular: no malignant arrhythmias Respiratory: No rhonchi very coarse breath sounds Skin: no rash seen on limited exam Musculoskeletal: No gross abnormality Psychiatric:unable to assess Neurologic:no involuntary movements         Lab Data:   Basic Metabolic Panel: Recent Labs  Lab 09/21/20 1052 09/23/20 0443 09/25/20 0618  NA 134* 134* 134*  K 4.3 4.4 4.9  CL 95* 95* 93*  CO2 '24 25 27  '$ GLUCOSE 90 156* 211*  BUN 101* 77* 71*  CREATININE 4.74* 3.87* 3.58*  CALCIUM 9.0 9.0 9.1  PHOS  --  3.1 2.6    ABG: No results for input(s): PHART, PCO2ART, PO2ART, HCO3, O2SAT in the last 168 hours.  Liver Function Tests: Recent Labs  Lab 09/23/20 0443 09/25/20 0618  ALBUMIN 1.5* 1.6*   No results for input(s): LIPASE, AMYLASE in the last 168 hours. No results for input(s): AMMONIA in the last 168 hours.  CBC: Recent Labs  Lab 09/19/20 0407 09/21/20 1052 09/23/20 0443 09/25/20 0618  WBC 23.0* 12.4* 10.1 10.3  NEUTROABS  --  9.8*  --   --   HGB 7.1* 7.4* 7.1* 7.5*  HCT 23.3* 23.9* 23.6* 24.3*  MCV 81.2 78.6* 79.5* 79.4*  PLT 271 306 324 313    Cardiac Enzymes: No results for input(s): CKTOTAL, CKMB, CKMBINDEX, TROPONINI in the last 168  hours.  BNP (last 3 results) No results for input(s): BNP in the last 8760 hours.  ProBNP (last 3 results) No results for input(s): PROBNP in the last 8760 hours.  Radiological Exams: No results found.  Assessment/Plan Active Problems:   Acute on chronic respiratory failure with hypoxia (HCC)   End stage renal disease on dialysis (HCC)   Lobar pneumonia, unspecified organism (HCC)   Chronic diastolic CHF (congestive heart failure) (HCC)   Acute on chronic respiratory failure hypoxia we will continue with the T collar wean titrate oxygen continue pulmonary toilet End-stage renal failure patient currently is on dialysis continue with supportive care Lobar pneumonia treated slow improvement Chronic diastolic heart failure compensated Tracheostomy remains in place   I have personally seen and evaluated the patient, evaluated laboratory and imaging results, formulated the assessment and plan and placed orders. The Patient requires high complexity decision making with multiple systems involvement.  Rounds were done with the Respiratory Therapy Director and Staff therapists and discussed with nursing staff also.  Allyne Gee, MD [facility] Pulmonary Critical Care Medicine Sleep Medicine

## 2020-09-25 NOTE — Progress Notes (Signed)
[facility]  ROUNDING NOTE   Subjective:  Patient sitting up in chair this AM. Not following any commands. Due for dialysis treatment today.  Objective:  Vital signs in last 24 hours:  Temperature nine 9.8 pulse 70 respirations 19 blood pressure 162/85  Physical Exam: General:  No acute distress  Head:  Normocephalic, atraumatic.    Eyes:  Anicteric  Neck:  Tracheostomy in place  Lungs:   Scattered rhonchi, normal effort  Heart:  S1S2 no rubs  Abdomen:   Soft, nontender, bowel sounds present  Extremities:  No peripheral edema in lower extremities  Neurologic:  Bilateral hand contractures, not following commands  Skin:  No acute skin rash  Access:  Left upper extremity AV fistula, left upper extremity swelling noted    Basic Metabolic Panel: Recent Labs  Lab 09/21/20 1052 09/23/20 0443 09/25/20 0618  NA 134* 134* 134*  K 4.3 4.4 4.9  CL 95* 95* 93*  CO2 '24 25 27  '$ GLUCOSE 90 156* 211*  BUN 101* 77* 71*  CREATININE 4.74* 3.87* 3.58*  CALCIUM 9.0 9.0 9.1  PHOS  --  3.1 2.6     Liver Function Tests: Recent Labs  Lab 09/23/20 0443 09/25/20 0618  ALBUMIN 1.5* 1.6*    No results for input(s): LIPASE, AMYLASE in the last 168 hours. No results for input(s): AMMONIA in the last 168 hours.  CBC: Recent Labs  Lab 09/19/20 0407 09/21/20 1052 09/23/20 0443 09/25/20 0618  WBC 23.0* 12.4* 10.1 10.3  NEUTROABS  --  9.8*  --   --   HGB 7.1* 7.4* 7.1* 7.5*  HCT 23.3* 23.9* 23.6* 24.3*  MCV 81.2 78.6* 79.5* 79.4*  PLT 271 306 324 313     Cardiac Enzymes: No results for input(s): CKTOTAL, CKMB, CKMBINDEX, TROPONINI in the last 168 hours.  BNP: Invalid input(s): POCBNP  CBG: No results for input(s): GLUCAP in the last 168 hours.  Microbiology: Results for orders placed or performed during the hospital encounter of 07/17/20  Culture, blood (routine x 2)     Status: None   Collection Time: 07/27/20 11:17 AM   Specimen: BLOOD RIGHT ARM  Result  Value Ref Range Status   Specimen Description BLOOD RIGHT ARM  Final   Special Requests   Final    BOTTLES DRAWN AEROBIC ONLY Blood Culture results may not be optimal due to an inadequate volume of blood received in culture bottles   Culture   Final    NO GROWTH 5 DAYS Performed at [facility], [place] [address], [place], [place] [address]    Report Status 08/01/2020 FINAL  Final  Culture, blood (routine x 2)     Status: None   Collection Time: 07/27/20 11:23 AM   Specimen: BLOOD RIGHT HAND  Result Value Ref Range Status   Specimen Description BLOOD RIGHT HAND  Final   Special Requests   Final    BOTTLES DRAWN AEROBIC ONLY Blood Culture results may not be optimal due to an inadequate volume of blood received in culture bottles   Culture   Final    NO GROWTH 5 DAYS Performed at [facility], [place] [address], [place], [place] [address]    Report Status 08/01/2020 FINAL  Final  Culture, blood (routine x 2)     Status: None   Collection Time: 08/01/20  2:25 PM   Specimen: BLOOD RIGHT HAND  Result Value Ref Range Status   Specimen Description BLOOD RIGHT HAND  Final  Special Requests   Final    BOTTLES DRAWN AEROBIC AND ANAEROBIC Blood Culture results may not be optimal due to an inadequate volume of blood received in culture bottles   Culture   Final    NO GROWTH 5 DAYS Performed at [facility], [place] [address] [place], [place] [address]    Report Status 08/06/2020 FINAL  Final  Culture, blood (routine x 2)     Status: None   Collection Time: 08/01/20  2:39 PM   Specimen: BLOOD RIGHT ARM  Result Value Ref Range Status   Specimen Description BLOOD RIGHT ARM  Final   Special Requests   Final    BOTTLES DRAWN AEROBIC ONLY Blood Culture results may not be optimal due to an inadequate volume of blood received in culture bottles   Culture   Final    NO GROWTH 5 DAYS Performed at [facility], [place] [address] [place], [place] [address]    Report Status  08/06/2020 FINAL  Final  Culture, Respiratory w Gram Stain     Status: None   Collection Time: 08/01/20  2:55 PM   Specimen: Tracheal Aspirate; Respiratory  Result Value Ref Range Status   Specimen Description TRACHEAL ASPIRATE  Final   Special Requests NONE  Final   Gram Stain   Final    FEW WBC PRESENT, PREDOMINANTLY PMN MODERATE GRAM NEGATIVE RODS Performed at [facility], [place] [address] [place], [place] [address]    Culture ABUNDANT PSEUDOMONAS AERUGINOSA  Final   Report Status 08/04/2020 FINAL  Final   Organism ID, Bacteria PSEUDOMONAS AERUGINOSA  Final      Susceptibility   Pseudomonas aeruginosa - MIC*    CEFTAZIDIME <=1 SENSITIVE Sensitive     CIPROFLOXACIN <=0.25 SENSITIVE Sensitive     GENTAMICIN 8 INTERMEDIATE Intermediate     IMIPENEM >=16 RESISTANT Resistant     PIP/TAZO <=4 SENSITIVE Sensitive     CEFEPIME 2 SENSITIVE Sensitive     * ABUNDANT PSEUDOMONAS AERUGINOSA  Culture, Respiratory w Gram Stain     Status: None   Collection Time: 09/17/20  5:21 PM   Specimen: Tracheal Aspirate; Respiratory  Result Value Ref Range Status   Specimen Description TRACHEAL ASPIRATE  Final   Special Requests NONE  Final   Gram Stain   Final    ABUNDANT WBC PRESENT,BOTH PMN AND MONONUCLEAR MODERATE SQUAMOUS EPITHELIAL CELLS PRESENT MODERATE GRAM POSITIVE COCCI MODERATE GRAM NEGATIVE RODS Performed at [facility], [place] [name], [place], [place] [address]    Culture FEW PSEUDOMONAS AERUGINOSA  Final   Report Status 09/22/2020 FINAL  Final   Organism ID, Bacteria PSEUDOMONAS AERUGINOSA  Final      Susceptibility   Pseudomonas aeruginosa - MIC*    CEFTAZIDIME 2 SENSITIVE Sensitive     CIPROFLOXACIN 1 SENSITIVE Sensitive     GENTAMICIN >=16 RESISTANT Resistant     IMIPENEM >=16 RESISTANT Resistant     PIP/TAZO <=4 SENSITIVE Sensitive     * FEW PSEUDOMONAS AERUGINOSA  Culture, blood (routine x 2)     Status: None   Collection Time: 09/18/20 12:05 PM   Specimen:  BLOOD RIGHT ARM  Result Value Ref Range Status   Specimen Description BLOOD RIGHT ARM  Final   Special Requests   Final    BOTTLES DRAWN AEROBIC AND ANAEROBIC Blood Culture adequate volume   Culture   Final    NO GROWTH 5 DAYS Performed at [facility],  [address], [place], [place] [address]    Report Status 09/23/2020 FINAL  Final  Culture, blood (routine x 2)     Status: None   Collection Time: 09/18/20 12:05 PM   Specimen: BLOOD RIGHT HAND  Result Value Ref Range Status   Specimen Description BLOOD RIGHT HAND  Final   Special Requests   Final    BOTTLES DRAWN AEROBIC AND ANAEROBIC Blood Culture adequate volume   Culture   Final    NO GROWTH 5 DAYS Performed at [facility], [place] [address] [place], [place] [address]    Report Status 09/23/2020 FINAL  Final    Coagulation Studies: No results for input(s): LABPROT, INR in the last 72 hours.   Urinalysis: No results for input(s): COLORURINE, LABSPEC, PHURINE, GLUCOSEU, HGBUR, BILIRUBINUR, KETONESUR, PROTEINUR, UROBILINOGEN, NITRITE, LEUKOCYTESUR in the last 72 hours.  Invalid input(s): APPERANCEUR    Imaging: No results found.   Medications:       Assessment/ Plan:  45 y.o. male with a PMHx of ESRD on HD, posterior reversible encephalopathy syndrome, diabetes mellitus type 2, chronic systolic heart failure, anemia of chronic kidney disease, secondary hyperparathyroidism, malignant hypertension, acute respiratory failure, who was admitted to [facility] on 06/29/2020 for ongoing treatment of acute respiratory failure, posterior reversible encephalopathy syndrome, and end-stage renal disease.   1.  ESRD on HD.  Patient due for dialysis treatment later this AM.  Orders have been prepared.  2.  Anemia of chronic kidney disease.  Hemoglobin up slightly to 7.5.  Maintain the patient on Retacrit.  3.  Secondary hyperparathyroidism.  Phosphorus remains acceptable at 2.6.  Continue to monitor bone marrow  metabolism parameters periodically.  4.  Acute respiratory failure.  Tracheostomy placed 07/17/2020.  Previously had Tracheostomy but now transition to T-piece.  5.  Hypertension.  Maintain the patient on amlodipine, clonidine, hydralazine, losartan.  Blood pressure has been labile.  Blood pressure this a.m. 162/85.    LOS: 0 Raedyn Wenke 9/23/20228:36 AM

## 2020-09-27 DIAGNOSIS — I5032 Chronic diastolic (congestive) heart failure: Secondary | ICD-10-CM | POA: Diagnosis not present

## 2020-09-27 DIAGNOSIS — N186 End stage renal disease: Secondary | ICD-10-CM | POA: Diagnosis not present

## 2020-09-27 DIAGNOSIS — J9621 Acute and chronic respiratory failure with hypoxia: Secondary | ICD-10-CM | POA: Diagnosis not present

## 2020-09-27 DIAGNOSIS — J181 Lobar pneumonia, unspecified organism: Secondary | ICD-10-CM | POA: Diagnosis not present

## 2020-09-27 NOTE — Progress Notes (Signed)
[facility]  PROGRESS NOTE     NEEDHAM MACGOWAN  [id number]  DOB: 1975-08-23   DOA: 07/17/2020  Referring Physician: Satira Sark, MD  HPI: Jeffery Andrews is a 45 y.o. male being followed for ventilator/airway/oxygen weaning Acute on Chronic Respiratory Failure.  Patient is comfortable right now without distress has been afebrile right now is on 28% FiO2 secretions are still copious  Medications: Reviewed on Rounds  Physical Exam:  Vitals: Temperature 98.5 pulse 78 respiratory rate is 28 blood pressure is 135/86 saturations 95%  Ventilator Settings on T collar FiO2 28%  General: Comfortable at this time Neck: supple Cardiovascular: no malignant arrhythmias Respiratory: Coarse rhonchi expansion is equal Skin: no rash seen on limited exam Musculoskeletal: No gross abnormality Psychiatric:unable to assess Neurologic:no involuntary movements         Lab Data:   Basic Metabolic Panel: Recent Labs  Lab 09/21/20 1052 09/23/20 0443 09/25/20 0618  NA 134* 134* 134*  K 4.3 4.4 4.9  CL 95* 95* 93*  CO2 '24 25 27  '$ GLUCOSE 90 156* 211*  BUN 101* 77* 71*  CREATININE 4.74* 3.87* 3.58*  CALCIUM 9.0 9.0 9.1  PHOS  --  3.1 2.6    ABG: No results for input(s): PHART, PCO2ART, PO2ART, HCO3, O2SAT in the last 168 hours.  Liver Function Tests: Recent Labs  Lab 09/23/20 0443 09/25/20 0618  ALBUMIN 1.5* 1.6*   No results for input(s): LIPASE, AMYLASE in the last 168 hours. No results for input(s): AMMONIA in the last 168 hours.  CBC: Recent Labs  Lab 09/21/20 1052 09/23/20 0443 09/25/20 0618  WBC 12.4* 10.1 10.3  NEUTROABS 9.8*  --   --   HGB 7.4* 7.1* 7.5*  HCT 23.9* 23.6* 24.3*  MCV 78.6* 79.5* 79.4*  PLT 306 324 313    Cardiac Enzymes: No results for input(s): CKTOTAL, CKMB, CKMBINDEX, TROPONINI in the last 168 hours.  BNP (last 3 results) No results for  input(s): BNP in the last 8760 hours.  ProBNP (last 3 results) No results for input(s): PROBNP in the last 8760 hours.  Radiological Exams: No results found.  Assessment/Plan Active Problems:   Acute on chronic respiratory failure with hypoxia (HCC)   End stage renal disease on dialysis (HCC)   Lobar pneumonia, unspecified organism (HCC)   Chronic diastolic CHF (congestive heart failure) (HCC)   Acute on chronic respiratory failure hypoxia we will continue with T collar trials titrate oxygen as tolerated continue pulmonary toilet. End-stage renal failure patient is on hemodialysis we will continue with supportive care Lobar pneumonia treated Chronic diastolic heart failure supportive care continue to follow along closely    I have personally seen and evaluated the patient, evaluated laboratory and imaging results, formulated the assessment and plan and placed orders. The Patient requires high complexity decision making with multiple systems involvement.  Rounds were done with the Respiratory Therapy Director and Staff therapists and discussed with nursing staff also.  Allyne Gee, MD [facility] Critical Care Medicine Sleep Medicine

## 2020-09-28 DIAGNOSIS — I5032 Chronic diastolic (congestive) heart failure: Secondary | ICD-10-CM | POA: Diagnosis not present

## 2020-09-28 DIAGNOSIS — J9621 Acute and chronic respiratory failure with hypoxia: Secondary | ICD-10-CM | POA: Diagnosis not present

## 2020-09-28 DIAGNOSIS — N186 End stage renal disease: Secondary | ICD-10-CM | POA: Diagnosis not present

## 2020-09-28 DIAGNOSIS — J181 Lobar pneumonia, unspecified organism: Secondary | ICD-10-CM | POA: Diagnosis not present

## 2020-09-28 LAB — CBC
HCT: 25.3 % — ABNORMAL LOW (ref 39.0–52.0)
Hemoglobin: 7.9 g/dL — ABNORMAL LOW (ref 13.0–17.0)
MCH: 24.4 pg — ABNORMAL LOW (ref 26.0–34.0)
MCHC: 31.2 g/dL (ref 30.0–36.0)
MCV: 78.1 fL — ABNORMAL LOW (ref 80.0–100.0)
Platelets: 263 10*3/uL (ref 150–400)
RBC: 3.24 MIL/uL — ABNORMAL LOW (ref 4.22–5.81)
RDW: 20.7 % — ABNORMAL HIGH (ref 11.5–15.5)
WBC: 20.9 10*3/uL — ABNORMAL HIGH (ref 4.0–10.5)
nRBC: 0.1 % (ref 0.0–0.2)

## 2020-09-28 LAB — RENAL FUNCTION PANEL
Albumin: 1.5 g/dL — ABNORMAL LOW (ref 3.5–5.0)
Anion gap: 15 (ref 5–15)
BUN: 97 mg/dL — ABNORMAL HIGH (ref 6–20)
CO2: 24 mmol/L (ref 22–32)
Calcium: 9.1 mg/dL (ref 8.9–10.3)
Chloride: 93 mmol/L — ABNORMAL LOW (ref 98–111)
Creatinine, Ser: 4.45 mg/dL — ABNORMAL HIGH (ref 0.61–1.24)
GFR, Estimated: 16 mL/min — ABNORMAL LOW (ref >60)
Glucose, Bld: 169 mg/dL — ABNORMAL HIGH (ref 70–99)
Phosphorus: 3.2 mg/dL (ref 2.5–4.6)
Potassium: 6.5 mmol/L (ref 3.5–5.1)
Sodium: 132 mmol/L — ABNORMAL LOW (ref 135–145)

## 2020-09-28 LAB — VANCOMYCIN, TROUGH: Vancomycin Tr: 15 ug/mL (ref 15–20)

## 2020-09-28 NOTE — Progress Notes (Signed)
[facility]  ROUNDING NOTE   Subjective:  Patient in bed this AM. Eyes open but not following any commands. Patient is scheduled for dialysis treatment today.  Objective:  Vital signs in last 24 hours:  Temperature 90.9 pulse 72 respirations 13 blood pressure 160/88  Physical Exam: General:  No acute distress  Head:  Normocephalic, atraumatic.    Eyes:  Anicteric  Neck:  Tracheostomy in place  Lungs:   Scattered rhonchi, normal effort  Heart:  S1S2 no rubs  Abdomen:   Soft, nontender, bowel sounds present  Extremities:  No peripheral edema in lower extremities  Neurologic:  Bilateral hand contractures, not following commands  Skin:  No acute skin rash  Access:  Left upper extremity AV fistula, left upper extremity swelling noted    Basic Metabolic Panel: Recent Labs  Lab 09/21/20 1052 09/23/20 0443 09/25/20 0618  NA 134* 134* 134*  K 4.3 4.4 4.9  CL 95* 95* 93*  CO2 '24 25 27  '$ GLUCOSE 90 156* 211*  BUN 101* 77* 71*  CREATININE 4.74* 3.87* 3.58*  CALCIUM 9.0 9.0 9.1  PHOS  --  3.1 2.6     Liver Function Tests: Recent Labs  Lab 09/23/20 0443 09/25/20 0618  ALBUMIN 1.5* 1.6*    No results for input(s): LIPASE, AMYLASE in the last 168 hours. No results for input(s): AMMONIA in the last 168 hours.  CBC: Recent Labs  Lab 09/21/20 1052 09/23/20 0443 09/25/20 0618  WBC 12.4* 10.1 10.3  NEUTROABS 9.8*  --   --   HGB 7.4* 7.1* 7.5*  HCT 23.9* 23.6* 24.3*  MCV 78.6* 79.5* 79.4*  PLT 306 324 313     Cardiac Enzymes: No results for input(s): CKTOTAL, CKMB, CKMBINDEX, TROPONINI in the last 168 hours.  BNP: Invalid input(s): POCBNP  CBG: No results for input(s): GLUCAP in the last 168 hours.  Microbiology: Results for orders placed or performed during the hospital encounter of 07/17/20  Culture, blood (routine x 2)     Status: None   Collection Time: 07/27/20 11:17 AM   Specimen: BLOOD RIGHT ARM  Result Value Ref Range Status    Specimen Description BLOOD RIGHT ARM  Final   Special Requests   Final    BOTTLES DRAWN AEROBIC ONLY Blood Culture results may not be optimal due to an inadequate volume of blood received in culture bottles   Culture   Final    NO GROWTH 5 DAYS Performed at [facility], [place] [address], [place], [place] [address]    Report Status 08/01/2020 FINAL  Final  Culture, blood (routine x 2)     Status: None   Collection Time: 07/27/20 11:23 AM   Specimen: BLOOD RIGHT HAND  Result Value Ref Range Status   Specimen Description BLOOD RIGHT HAND  Final   Special Requests   Final    BOTTLES DRAWN AEROBIC ONLY Blood Culture results may not be optimal due to an inadequate volume of blood received in culture bottles   Culture   Final    NO GROWTH 5 DAYS Performed at [facility], [place] [address], [place], [place] [address]    Report Status 08/01/2020 FINAL  Final  Culture, blood (routine x 2)     Status: None   Collection Time: 08/01/20  2:25 PM   Specimen: BLOOD RIGHT HAND  Result Value Ref Range Status   Specimen Description BLOOD RIGHT HAND  Final   Special Requests   Final  BOTTLES DRAWN AEROBIC AND ANAEROBIC Blood Culture results may not be optimal due to an inadequate volume of blood received in culture bottles   Culture   Final    NO GROWTH 5 DAYS Performed at [facility], [place] [address], [place], [place] [address]    Report Status 08/06/2020 FINAL  Final  Culture, blood (routine x 2)     Status: None   Collection Time: 08/01/20  2:39 PM   Specimen: BLOOD RIGHT ARM  Result Value Ref Range Status   Specimen Description BLOOD RIGHT ARM  Final   Special Requests   Final    BOTTLES DRAWN AEROBIC ONLY Blood Culture results may not be optimal due to an inadequate volume of blood received in culture bottles   Culture   Final    NO GROWTH 5 DAYS Performed at [facility], [place] [address], [place], [place] [address]    Report Status 08/06/2020 FINAL  Final  Culture,  Respiratory w Gram Stain     Status: None   Collection Time: 08/01/20  2:55 PM   Specimen: Tracheal Aspirate; Respiratory  Result Value Ref Range Status   Specimen Description TRACHEAL ASPIRATE  Final   Special Requests NONE  Final   Gram Stain   Final    FEW WBC PRESENT, PREDOMINANTLY PMN MODERATE GRAM NEGATIVE RODS Performed at [facility], [place] [address] [place], [place] [address]    Culture ABUNDANT PSEUDOMONAS AERUGINOSA  Final   Report Status 08/04/2020 FINAL  Final   Organism ID, Bacteria PSEUDOMONAS AERUGINOSA  Final      Susceptibility   Pseudomonas aeruginosa - MIC*    CEFTAZIDIME <=1 SENSITIVE Sensitive     CIPROFLOXACIN <=0.25 SENSITIVE Sensitive     GENTAMICIN 8 INTERMEDIATE Intermediate     IMIPENEM >=16 RESISTANT Resistant     PIP/TAZO <=4 SENSITIVE Sensitive     CEFEPIME 2 SENSITIVE Sensitive     * ABUNDANT PSEUDOMONAS AERUGINOSA  Culture, Respiratory w Gram Stain     Status: None   Collection Time: 09/17/20  5:21 PM   Specimen: Tracheal Aspirate; Respiratory  Result Value Ref Range Status   Specimen Description TRACHEAL ASPIRATE  Final   Special Requests NONE  Final   Gram Stain   Final    ABUNDANT WBC PRESENT,BOTH PMN AND MONONUCLEAR MODERATE SQUAMOUS EPITHELIAL CELLS PRESENT MODERATE GRAM POSITIVE COCCI MODERATE GRAM NEGATIVE RODS Performed at [facility], [place] [address], [place], [place] [address]    Culture FEW PSEUDOMONAS AERUGINOSA  Final   Report Status 09/22/2020 FINAL  Final   Organism ID, Bacteria PSEUDOMONAS AERUGINOSA  Final      Susceptibility   Pseudomonas aeruginosa - MIC*    CEFTAZIDIME 2 SENSITIVE Sensitive     CIPROFLOXACIN 1 SENSITIVE Sensitive     GENTAMICIN >=16 RESISTANT Resistant     IMIPENEM >=16 RESISTANT Resistant     PIP/TAZO <=4 SENSITIVE Sensitive     * FEW PSEUDOMONAS AERUGINOSA  Culture, blood (routine x 2)     Status: None   Collection Time: 09/18/20 12:05 PM   Specimen: BLOOD RIGHT ARM  Result Value Ref  Range Status   Specimen Description BLOOD RIGHT ARM  Final   Special Requests   Final    BOTTLES DRAWN AEROBIC AND ANAEROBIC Blood Culture adequate volume   Culture   Final    NO GROWTH 5 DAYS Performed at [facility], [address], [place], [place] [address]  Report Status 09/23/2020 FINAL  Final  Culture, blood (routine x 2)     Status: None   Collection Time: 09/18/20 12:05 PM   Specimen: BLOOD RIGHT HAND  Result Value Ref Range Status   Specimen Description BLOOD RIGHT HAND  Final   Special Requests   Final    BOTTLES DRAWN AEROBIC AND ANAEROBIC Blood Culture adequate volume   Culture   Final    NO GROWTH 5 DAYS Performed at [facility], [place] [address] [place], [place] [address]    Report Status 09/23/2020 FINAL  Final    Coagulation Studies: No results for input(s): LABPROT, INR in the last 72 hours.   Urinalysis: No results for input(s): COLORURINE, LABSPEC, PHURINE, GLUCOSEU, HGBUR, BILIRUBINUR, KETONESUR, PROTEINUR, UROBILINOGEN, NITRITE, LEUKOCYTESUR in the last 72 hours.  Invalid input(s): APPERANCEUR    Imaging: No results found.   Medications:       Assessment/ Plan:  45 y.o. male with a PMHx of ESRD on HD, posterior reversible encephalopathy syndrome, diabetes mellitus type 2, chronic systolic heart failure, anemia of chronic kidney disease, secondary hyperparathyroidism, malignant hypertension, acute respiratory failure, who was admitted to [facility] on 06/29/2020 for ongoing treatment of acute respiratory failure, posterior reversible encephalopathy syndrome, and end-stage renal disease.   1.  ESRD on HD.  We will plan for hemodialysis treatment today.  Continue MWF schedule.  2.  Anemia of chronic kidney disease.  Repeat hemoglobin today.  Most recent hemoglobin was 7.5.  3.  Secondary hyperparathyroidism.  Phosphorus was 2.6 at last check.  Continue to monitor to monitor bone mineral metabolism parameters.  4.  Acute  respiratory failure.  Tracheostomy placed 07/17/2020.  Continue current respiratory support with T-piece.  5.  Hypertension.  Blood pressure currently 160/88.  Maintain the patient on current antihypertensives.    LOS: 0 Hadyn Azer 9/26/20228:05 AM

## 2020-09-28 NOTE — Progress Notes (Signed)
P[facility]  PROGRESS NOTE     KEVORK BOWNDS  [id number]  DOB: Sep 19, 1975   DOA: 07/17/2020  Referring Physician: Satira Sark, MD  HPI: Jeffery Andrews is a 45 y.o. male being followed for ventilator/airway/oxygen weaning Acute on Chronic Respiratory Failure.  Patient currently is on T collar has been on 20% FiO2 with good saturations  Medications: Reviewed on Rounds  Physical Exam:  Vitals: Temperature is 98.9 pulse 72 respiratory rate is 13 blood pressure is 160/88 saturations 98%  Ventilator Settings off the ventilator on T collar currently on 28% FiO2  General: Comfortable at this time Neck: supple Cardiovascular: no malignant arrhythmias Respiratory: Coarse rhonchi expansion is equal at this time Skin: no rash seen on limited exam Musculoskeletal: No gross abnormality Psychiatric:unable to assess Neurologic:no involuntary movements         Lab Data:   Basic Metabolic Panel: Recent Labs  Lab 09/21/20 1052 09/23/20 0443 09/25/20 0618  NA 134* 134* 134*  K 4.3 4.4 4.9  CL 95* 95* 93*  CO2 '24 25 27  '$ GLUCOSE 90 156* 211*  BUN 101* 77* 71*  CREATININE 4.74* 3.87* 3.58*  CALCIUM 9.0 9.0 9.1  PHOS  --  3.1 2.6    ABG: No results for input(s): PHART, PCO2ART, PO2ART, HCO3, O2SAT in the last 168 hours.  Liver Function Tests: Recent Labs  Lab 09/23/20 0443 09/25/20 0618  ALBUMIN 1.5* 1.6*   No results for input(s): LIPASE, AMYLASE in the last 168 hours. No results for input(s): AMMONIA in the last 168 hours.  CBC: Recent Labs  Lab 09/21/20 1052 09/23/20 0443 09/25/20 0618  WBC 12.4* 10.1 10.3  NEUTROABS 9.8*  --   --   HGB 7.4* 7.1* 7.5*  HCT 23.9* 23.6* 24.3*  MCV 78.6* 79.5* 79.4*  PLT 306 324 313    Cardiac Enzymes: No results for input(s): CKTOTAL, CKMB, CKMBINDEX, TROPONINI in the last 168 hours.  BNP (last 3 results) No results for  input(s): BNP in the last 8760 hours.  ProBNP (last 3 results) No results for input(s): PROBNP in the last 8760 hours.  Radiological Exams: No results found.  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 The Patient requires high complexity decision making with multiple systems involvement.  Rounds were done with the Respiratory Therapy Director and Staff therapists and discussed with nursing staff also.  Allyne Gee, MD [facility] Pulmonary Critical Care Medicine Sleep Medicine

## 2020-09-29 ENCOUNTER — Other Ambulatory Visit (HOSPITAL_COMMUNITY): Payer: Medicare Other

## 2020-09-29 DIAGNOSIS — I5032 Chronic diastolic (congestive) heart failure: Secondary | ICD-10-CM | POA: Diagnosis not present

## 2020-09-29 DIAGNOSIS — N186 End stage renal disease: Secondary | ICD-10-CM | POA: Diagnosis not present

## 2020-09-29 DIAGNOSIS — J9621 Acute and chronic respiratory failure with hypoxia: Secondary | ICD-10-CM | POA: Diagnosis not present

## 2020-09-29 DIAGNOSIS — J181 Lobar pneumonia, unspecified organism: Secondary | ICD-10-CM | POA: Diagnosis not present

## 2020-09-29 LAB — CBC
HCT: 22.7 % — ABNORMAL LOW (ref 39.0–52.0)
Hemoglobin: 6.7 g/dL — CL (ref 13.0–17.0)
MCH: 23.6 pg — ABNORMAL LOW (ref 26.0–34.0)
MCHC: 29.5 g/dL — ABNORMAL LOW (ref 30.0–36.0)
MCV: 79.9 fL — ABNORMAL LOW (ref 80.0–100.0)
Platelets: 268 10*3/uL (ref 150–400)
RBC: 2.84 MIL/uL — ABNORMAL LOW (ref 4.22–5.81)
RDW: 20.8 % — ABNORMAL HIGH (ref 11.5–15.5)
WBC: 24.6 10*3/uL — ABNORMAL HIGH (ref 4.0–10.5)
nRBC: 0.1 % (ref 0.0–0.2)

## 2020-09-29 LAB — PREPARE RBC (CROSSMATCH)

## 2020-09-29 LAB — POTASSIUM: Potassium: 4.8 mmol/L (ref 3.5–5.1)

## 2020-09-29 NOTE — Progress Notes (Signed)
Pulmonary [place]  PROGRESS NOTE     Jeffery Andrews  [id number]  DOB: [birth year]   DOA: 07/17/2020  Referring Physician: Satira Sark, MD  HPI: Jeffery Andrews is a 45 y.o. male being followed for ventilator/airway/oxygen weaning Acute on Chronic Respiratory Failure.  Patient is currently on T collar has been on 28% FiO2  Medications: Reviewed on Rounds  Physical Exam:  Vitals: Temperature is 97.6 pulse 75 respiratory rate is 15 blood pressure is 146/77 saturations 99%  Ventilator Settings on T collar currently on 28% FiO2  General: Comfortable at this time Neck: supple Cardiovascular: no malignant arrhythmias Respiratory: No rhonchi no rales are noted at this time Skin: no rash seen on limited exam Musculoskeletal: No gross abnormality Psychiatric:unable to assess Neurologic:no involuntary movements         Lab Data:   Basic Metabolic Panel: Recent Labs  Lab 09/23/20 0443 09/25/20 0618 09/28/20 0810 09/29/20 0624  NA 134* 134* 132*  --   K 4.4 4.9 6.5* 4.8  CL 95* 93* 93*  --   CO2 '25 27 24  '$ --   GLUCOSE 156* 211* 169*  --   BUN 77* 71* 97*  --   CREATININE 3.87* 3.58* 4.45*  --   CALCIUM 9.0 9.1 9.1  --   PHOS 3.1 2.6 3.2  --     ABG: No results for input(s): PHART, PCO2ART, PO2ART, HCO3, O2SAT in the last 168 hours.  Liver Function Tests: Recent Labs  Lab 09/23/20 0443 09/25/20 0618 09/28/20 0810  ALBUMIN 1.5* 1.6* 1.5*   No results for input(s): LIPASE, AMYLASE in the last 168 hours. No results for input(s): AMMONIA in the last 168 hours.  CBC: Recent Labs  Lab 09/23/20 0443 09/25/20 0618 09/28/20 0810 09/29/20 0624  WBC 10.1 10.3 20.9* 24.6*  HGB 7.1* 7.5* 7.9* 6.7*  HCT 23.6* 24.3* 25.3* 22.7*  MCV 79.5* 79.4* 78.1* 79.9*  PLT 324 313 263 268    Cardiac Enzymes: No results for input(s): CKTOTAL, CKMB, CKMBINDEX, TROPONINI in the last  168 hours.  BNP (last 3 results) No results for input(s): BNP in the last 8760 hours.  ProBNP (last 3 results) No results for input(s): PROBNP in the last 8760 hours.  Radiological Exams: No results found.  Assessment/Plan Active Problems:   Acute on chronic respiratory failure with hypoxia (HCC)   End stage renal disease on dialysis (HCC)   Lobar pneumonia, unspecified organism (HCC)   Chronic diastolic CHF (congestive heart failure) (HCC)   Acute on chronic respiratory failure with hypoxia we will continue with the T collar patient is at baseline.  Continue secretion management pulmonary toilet. End-stage renal failure on hemodialysis we will continue to monitor closely Lobar pneumonia treated continue with supportive care Chronic diastolic heart failure patient is at baseline Tracheostomy will remain in place   I have personally seen and evaluated the patient, evaluated laboratory and imaging results, formulated the assessment and plan and placed orders. The Patient requires high complexity decision making with multiple systems involvement.  Rounds were done with the Respiratory Therapy Director and Staff therapists and discussed with nursing staff also.  Allyne Gee, MD [facility] Pulmonary Critical Care Medicine Sleep Medicine

## 2020-09-30 DIAGNOSIS — N186 End stage renal disease: Secondary | ICD-10-CM | POA: Diagnosis not present

## 2020-09-30 DIAGNOSIS — J9621 Acute and chronic respiratory failure with hypoxia: Secondary | ICD-10-CM | POA: Diagnosis not present

## 2020-09-30 DIAGNOSIS — J181 Lobar pneumonia, unspecified organism: Secondary | ICD-10-CM | POA: Diagnosis not present

## 2020-09-30 DIAGNOSIS — I5032 Chronic diastolic (congestive) heart failure: Secondary | ICD-10-CM | POA: Diagnosis not present

## 2020-09-30 LAB — BLOOD CULTURE ID PANEL (REFLEXED) - BCID2
A.calcoaceticus-baumannii: NOT DETECTED
Bacteroides fragilis: NOT DETECTED
Candida albicans: NOT DETECTED
Candida auris: NOT DETECTED
Candida glabrata: NOT DETECTED
Candida krusei: NOT DETECTED
Candida parapsilosis: NOT DETECTED
Candida tropicalis: NOT DETECTED
Cryptococcus neoformans/gattii: NOT DETECTED
Enterobacter cloacae complex: NOT DETECTED
Enterobacterales: NOT DETECTED
Enterococcus Faecium: NOT DETECTED
Enterococcus faecalis: DETECTED — AB
Escherichia coli: NOT DETECTED
Haemophilus influenzae: NOT DETECTED
Klebsiella aerogenes: NOT DETECTED
Klebsiella oxytoca: NOT DETECTED
Klebsiella pneumoniae: NOT DETECTED
Listeria monocytogenes: NOT DETECTED
Neisseria meningitidis: NOT DETECTED
Proteus species: NOT DETECTED
Pseudomonas aeruginosa: NOT DETECTED
Salmonella species: NOT DETECTED
Serratia marcescens: NOT DETECTED
Staphylococcus aureus (BCID): NOT DETECTED
Staphylococcus epidermidis: NOT DETECTED
Staphylococcus lugdunensis: NOT DETECTED
Staphylococcus species: NOT DETECTED
Stenotrophomonas maltophilia: NOT DETECTED
Streptococcus agalactiae: NOT DETECTED
Streptococcus pneumoniae: NOT DETECTED
Streptococcus pyogenes: NOT DETECTED
Streptococcus species: NOT DETECTED
Vancomycin resistance: DETECTED — AB

## 2020-09-30 LAB — TYPE AND SCREEN
ABO/RH(D): O POS
Antibody Screen: NEGATIVE
Unit division: 0

## 2020-09-30 LAB — RENAL FUNCTION PANEL
Albumin: 1.5 g/dL — ABNORMAL LOW (ref 3.5–5.0)
Anion gap: 13 (ref 5–15)
BUN: 74 mg/dL — ABNORMAL HIGH (ref 6–20)
CO2: 26 mmol/L (ref 22–32)
Calcium: 8.8 mg/dL — ABNORMAL LOW (ref 8.9–10.3)
Chloride: 94 mmol/L — ABNORMAL LOW (ref 98–111)
Creatinine, Ser: 3.76 mg/dL — ABNORMAL HIGH (ref 0.61–1.24)
GFR, Estimated: 19 mL/min — ABNORMAL LOW (ref >60)
Glucose, Bld: 219 mg/dL — ABNORMAL HIGH (ref 70–99)
Phosphorus: 4.1 mg/dL (ref 2.5–4.6)
Potassium: 5.6 mmol/L — ABNORMAL HIGH (ref 3.5–5.1)
Sodium: 133 mmol/L — ABNORMAL LOW (ref 135–145)

## 2020-09-30 LAB — BPAM RBC
Blood Product Expiration Date: 202210282359
ISSUE DATE / TIME: 202209271607
Unit Type and Rh: 5100

## 2020-09-30 LAB — CBC
HCT: 26.8 % — ABNORMAL LOW (ref 39.0–52.0)
Hemoglobin: 8.4 g/dL — ABNORMAL LOW (ref 13.0–17.0)
MCH: 24.8 pg — ABNORMAL LOW (ref 26.0–34.0)
MCHC: 31.3 g/dL (ref 30.0–36.0)
MCV: 79.1 fL — ABNORMAL LOW (ref 80.0–100.0)
Platelets: 263 10*3/uL (ref 150–400)
RBC: 3.39 MIL/uL — ABNORMAL LOW (ref 4.22–5.81)
RDW: 20.3 % — ABNORMAL HIGH (ref 11.5–15.5)
WBC: 25.4 10*3/uL — ABNORMAL HIGH (ref 4.0–10.5)
nRBC: 0.1 % (ref 0.0–0.2)

## 2020-09-30 LAB — VANCOMYCIN, TROUGH: Vancomycin Tr: 14 ug/mL — ABNORMAL LOW (ref 15–20)

## 2020-09-30 NOTE — Progress Notes (Signed)
[facility]  ROUNDING NOTE   Subjective:  Patient resting in bed currently. Due for hemodialysis treatment today. Potassium slightly high at 5.6 this AM.  Objective:  Vital signs in last 24 hours:  Temperature 98.1 pulse 72 respirations 13 blood pressure 155/80  Physical Exam: General:  No acute distress  Head:  Normocephalic, atraumatic.    Eyes:  Anicteric  Neck:  Tracheostomy in place  Lungs:   Scattered rhonchi, normal effort  Heart:  S1S2 no rubs  Abdomen:   Soft, nontender, bowel sounds present  Extremities:  No peripheral edema in lower extremities  Neurologic:  Bilateral hand contractures, not following commands  Skin:  No acute skin rash  Access:  Left upper extremity AV fistula, left upper extremity swelling noted    Basic Metabolic Panel: Recent Labs  Lab 09/25/20 0618 09/28/20 0810 09/29/20 0624 09/30/20 0446  NA 134* 132*  --  133*  K 4.9 6.5* 4.8 5.6*  CL 93* 93*  --  94*  CO2 27 24  --  26  GLUCOSE 211* 169*  --  219*  BUN 71* 97*  --  74*  CREATININE 3.58* 4.45*  --  3.76*  CALCIUM 9.1 9.1  --  8.8*  PHOS 2.6 3.2  --  4.1     Liver Function Tests: Recent Labs  Lab 09/25/20 0618 09/28/20 0810 09/30/20 0446  ALBUMIN 1.6* 1.5* 1.5*    No results for input(s): LIPASE, AMYLASE in the last 168 hours. No results for input(s): AMMONIA in the last 168 hours.  CBC: Recent Labs  Lab 09/25/20 0618 09/28/20 0810 09/29/20 0624 09/30/20 0446  WBC 10.3 20.9* 24.6* 25.4*  HGB 7.5* 7.9* 6.7* 8.4*  HCT 24.3* 25.3* 22.7* 26.8*  MCV 79.4* 78.1* 79.9* 79.1*  PLT 313 263 268 263     Cardiac Enzymes: No results for input(s): CKTOTAL, CKMB, CKMBINDEX, TROPONINI in the last 168 hours.  BNP: Invalid input(s): POCBNP  CBG: No results for input(s): GLUCAP in the last 168 hours.  Microbiology: Results for orders placed or performed during the hospital encounter of 07/17/20  Culture, blood (routine x 2)     Status: None   Collection  Time: 07/27/20 11:17 AM   Specimen: BLOOD RIGHT ARM  Result Value Ref Range Status   Specimen Description BLOOD RIGHT ARM  Final   Special Requests   Final    BOTTLES DRAWN AEROBIC ONLY Blood Culture results may not be optimal due to an inadequate volume of blood received in culture bottles   Culture   Final    NO GROWTH 5 DAYS Performed at [facility], [place] [address] [place], [place] [address]    Report Status 08/01/2020 FINAL  Final  Culture, blood (routine x 2)     Status: None   Collection Time: 07/27/20 11:23 AM   Specimen: BLOOD RIGHT HAND  Result Value Ref Range Status   Specimen Description BLOOD RIGHT HAND  Final   Special Requests   Final    BOTTLES DRAWN AEROBIC ONLY Blood Culture results may not be optimal due to an inadequate volume of blood received in culture bottles   Culture   Final    NO GROWTH 5 DAYS Performed at [facility], [place] [address], [place], [place] [address]    Report Status 08/01/2020 FINAL  Final  Culture, blood (routine x 2)     Status: None   Collection Time: 08/01/20  2:25 PM   Specimen: BLOOD RIGHT  HAND  Result Value Ref Range Status   Specimen Description BLOOD RIGHT HAND  Final   Special Requests   Final    BOTTLES DRAWN AEROBIC AND ANAEROBIC Blood Culture results may not be optimal due to an inadequate volume of blood received in culture bottles   Culture   Final    NO GROWTH 5 DAYS Performed at [facility], [place] [address] [place], [place] [address]    Report Status 08/06/2020 FINAL  Final  Culture, blood (routine x 2)     Status: None   Collection Time: 08/01/20  2:39 PM   Specimen: BLOOD RIGHT ARM  Result Value Ref Range Status   Specimen Description BLOOD RIGHT ARM  Final   Special Requests   Final    BOTTLES DRAWN AEROBIC ONLY Blood Culture results may not be optimal due to an inadequate volume of blood received in culture bottles   Culture   Final    NO GROWTH 5 DAYS Performed at [facility],  [place] [address], [place], [place] [address]    Report Status 08/06/2020 FINAL  Final  Culture, Respiratory w Gram Stain     Status: None   Collection Time: 08/01/20  2:55 PM   Specimen: Tracheal Aspirate; Respiratory  Result Value Ref Range Status   Specimen Description TRACHEAL ASPIRATE  Final   Special Requests NONE  Final   Gram Stain   Final    FEW WBC PRESENT, PREDOMINANTLY PMN MODERATE GRAM NEGATIVE RODS Performed at [facility], [place] [address], [place], [place] [address]    Culture ABUNDANT PSEUDOMONAS AERUGINOSA  Final   Report Status 08/04/2020 FINAL  Final   Organism ID, Bacteria PSEUDOMONAS AERUGINOSA  Final      Susceptibility   Pseudomonas aeruginosa - MIC*    CEFTAZIDIME <=1 SENSITIVE Sensitive     CIPROFLOXACIN <=0.25 SENSITIVE Sensitive     GENTAMICIN 8 INTERMEDIATE Intermediate     IMIPENEM >=16 RESISTANT Resistant     PIP/TAZO <=4 SENSITIVE Sensitive     CEFEPIME 2 SENSITIVE Sensitive     * ABUNDANT PSEUDOMONAS AERUGINOSA  Culture, Respiratory w Gram Stain     Status: None   Collection Time: 09/17/20  5:21 PM   Specimen: Tracheal Aspirate; Respiratory  Result Value Ref Range Status   Specimen Description TRACHEAL ASPIRATE  Final   Special Requests NONE  Final   Gram Stain   Final    ABUNDANT WBC PRESENT,BOTH PMN AND MONONUCLEAR MODERATE SQUAMOUS EPITHELIAL CELLS PRESENT MODERATE GRAM POSITIVE COCCI MODERATE GRAM NEGATIVE RODS Performed at [facility], [place] [address] [place], [place] [address]    Culture FEW PSEUDOMONAS AERUGINOSA  Final   Report Status 09/22/2020 FINAL  Final   Organism ID, Bacteria PSEUDOMONAS AERUGINOSA  Final      Susceptibility   Pseudomonas aeruginosa - MIC*    CEFTAZIDIME 2 SENSITIVE Sensitive     CIPROFLOXACIN 1 SENSITIVE Sensitive     GENTAMICIN >=16 RESISTANT Resistant     IMIPENEM >=16 RESISTANT Resistant     PIP/TAZO <=4 SENSITIVE Sensitive     * FEW PSEUDOMONAS AERUGINOSA  Culture, blood (routine x 2)      Status: None   Collection Time: 09/18/20 12:05 PM   Specimen: BLOOD RIGHT ARM  Result Value Ref Range Status   Specimen Description BLOOD RIGHT ARM  Final   Special Requests   Final    BOTTLES DRAWN AEROBIC AND ANAEROBIC Blood Culture adequate volume  Culture   Final    NO GROWTH 5 DAYS Performed at [facility], [place] [address] [place], [place] [address]    Report Status 09/23/2020 FINAL  Final  Culture, blood (routine x 2)     Status: None   Collection Time: 09/18/20 12:05 PM   Specimen: BLOOD RIGHT HAND  Result Value Ref Range Status   Specimen Description BLOOD RIGHT HAND  Final   Special Requests   Final    BOTTLES DRAWN AEROBIC AND ANAEROBIC Blood Culture adequate volume   Culture   Final    NO GROWTH 5 DAYS Performed at [facility], [place] [address], [place], [place] [address]    Report Status 09/23/2020 FINAL  Final  Culture, Respiratory w Gram Stain     Status: None (Preliminary result)   Collection Time: 09/29/20 12:06 PM   Specimen: Tracheal Aspirate; Respiratory  Result Value Ref Range Status   Specimen Description TRACHEAL ASPIRATE  Final   Special Requests NONE  Final   Gram Stain   Final    RARE SQUAMOUS EPITHELIAL CELLS PRESENT ABUNDANT WBC PRESENT,BOTH PMN AND MONONUCLEAR ABUNDANT GRAM NEGATIVE RODS Performed at [facility], [place] [address], [place], [place] [address]    Culture FEW GRAM NEGATIVE RODS  Final   Report Status PENDING  Incomplete    Coagulation Studies: No results for input(s): LABPROT, INR in the last 72 hours.   Urinalysis: No results for input(s): COLORURINE, LABSPEC, PHURINE, GLUCOSEU, HGBUR, BILIRUBINUR, KETONESUR, PROTEINUR, UROBILINOGEN, NITRITE, LEUKOCYTESUR in the last 72 hours.  Invalid input(s): APPERANCEUR    Imaging: DG Chest Port 1 View  Result Date: 09/29/2020 CLINICAL DATA:  Elevated WBC EXAM: PORTABLE CHEST 1 VIEW COMPARISON:  09/18/2020 FINDINGS: Rotated examination. The heart size and mediastinal  contours are within normal limits. Tracheostomy. Both lungs are clear. The visualized skeletal structures are unremarkable. IMPRESSION: Tracheostomy without acute abnormality of the lungs in rotated AP portable projection. Electronically Signed   By: Eddie Candle M.D.   On: 09/29/2020 12:59     Medications:       Assessment/ Plan:  45 y.o. male with a PMHx of ESRD on HD, posterior reversible encephalopathy syndrome, diabetes mellitus type 2, chronic systolic heart failure, anemia of chronic kidney disease, secondary hyperparathyroidism, malignant hypertension, acute respiratory failure, who was admitted to [facility] on 06/29/2020 for ongoing treatment of acute respiratory failure, posterior reversible encephalopathy syndrome, and end-stage renal disease.   1.  ESRD on HD.  Patient continues on MWF dialysis schedule.  Due for dialysis treatment today.  2.  Anemia of chronic kidney disease.  Hemoglobin up to 8.4 posttransfusion.  Continue to monitor CBC.  Continue Retacrit 6000 units IV with dialysis as well.  3.  Secondary hyperparathyroidism.  Phosphorus within target range at 4.1.  Continue to monitor bone metabolism parameters periodically.  4.  Acute respiratory failure.  Tracheostomy placed 07/17/2020.  Continue current respiratory support with T-piece.  5.  Hypertension.  Blood pressure currently 155/80.  Patient to be maintained on amlodipine, clonidine, hydralazine, labetalol, and losartan.    LOS: 0 Shantia Sanford 9/28/20228:20 AM

## 2020-09-30 NOTE — Progress Notes (Signed)
[facility]  PROGRESS NOTE     Jeffery Andrews  [id number]  DOB: [birth year]   DOA: 07/17/2020  Referring Physician: Satira Sark, MD  HPI: Jeffery Andrews is a 45 y.o. male being followed for ventilator/airway/oxygen weaning Acute on Chronic Respiratory Failure.  Patient currently is on T collar appears to be comfortable without distress  Medications: Reviewed on Rounds  Physical Exam:  Vitals: Temperature is 98.1 pulse 72 respiratory rate is 13 blood pressure is 170/88 saturations 100%  Ventilator Settings on T collar  General: Comfortable at this time Neck: supple Cardiovascular: no malignant arrhythmias Respiratory: No rhonchi very coarse breath sounds Skin: no rash seen on limited exam Musculoskeletal: No gross abnormality Psychiatric:unable to assess Neurologic:no involuntary movements         Lab Data:   Basic Metabolic Panel: Recent Labs  Lab 09/25/20 0618 09/28/20 0810 09/29/20 0624 09/30/20 0446  NA 134* 132*  --  133*  K 4.9 6.5* 4.8 5.6*  CL 93* 93*  --  94*  CO2 27 24  --  26  GLUCOSE 211* 169*  --  219*  BUN 71* 97*  --  74*  CREATININE 3.58* 4.45*  --  3.76*  CALCIUM 9.1 9.1  --  8.8*  PHOS 2.6 3.2  --  4.1    ABG: No results for input(s): PHART, PCO2ART, PO2ART, HCO3, O2SAT in the last 168 hours.  Liver Function Tests: Recent Labs  Lab 09/25/20 0618 09/28/20 0810 09/30/20 0446  ALBUMIN 1.6* 1.5* 1.5*   No results for input(s): LIPASE, AMYLASE in the last 168 hours. No results for input(s): AMMONIA in the last 168 hours.  CBC: Recent Labs  Lab 09/25/20 0618 09/28/20 0810 09/29/20 0624 09/30/20 0446  WBC 10.3 20.9* 24.6* 25.4*  HGB 7.5* 7.9* 6.7* 8.4*  HCT 24.3* 25.3* 22.7* 26.8*  MCV 79.4* 78.1* 79.9* 79.1*  PLT 313 263 268 263    Cardiac Enzymes: No results for input(s): CKTOTAL, CKMB, CKMBINDEX, TROPONINI in the last 168  hours.  BNP (last 3 results) No results for input(s): BNP in the last 8760 hours.  ProBNP (last 3 results) No results for input(s): PROBNP in the last 8760 hours.  Radiological Exams: DG Chest Port 1 View  Result Date: 09/29/2020 CLINICAL DATA:  Elevated WBC EXAM: PORTABLE CHEST 1 VIEW COMPARISON:  09/18/2020 FINDINGS: Rotated examination. The heart size and mediastinal contours are within normal limits. Tracheostomy. Both lungs are clear. The visualized skeletal structures are unremarkable. IMPRESSION: Tracheostomy without acute abnormality of the lungs in rotated AP portable projection. Electronically Signed   By: Eddie Candle M.D.   On: 09/29/2020 12:59    Assessment/Plan Active Problems:   Acute on chronic respiratory failure with hypoxia (HCC)   End stage renal disease on dialysis (HCC)   Lobar pneumonia, unspecified organism (HCC)   Chronic diastolic CHF (congestive heart failure) (HCC)   Acute on chronic respiratory failure with hypoxia we will continue with the T collar trials titrate oxygen as tolerated we will continue pulmonary toilet. 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 The Patient requires high complexity decision making with multiple systems involvement.  Rounds were done with the Respiratory Therapy Director and Staff therapists  and discussed with nursing staff also.  Allyne Gee, MD [facility] Pulmonary Critical Care Medicine Sleep Medicine

## 2020-10-01 DIAGNOSIS — J9621 Acute and chronic respiratory failure with hypoxia: Secondary | ICD-10-CM | POA: Diagnosis not present

## 2020-10-01 DIAGNOSIS — J181 Lobar pneumonia, unspecified organism: Secondary | ICD-10-CM | POA: Diagnosis not present

## 2020-10-01 DIAGNOSIS — N186 End stage renal disease: Secondary | ICD-10-CM | POA: Diagnosis not present

## 2020-10-01 DIAGNOSIS — I5032 Chronic diastolic (congestive) heart failure: Secondary | ICD-10-CM | POA: Diagnosis not present

## 2020-10-01 NOTE — Progress Notes (Signed)
[facility]  PROGRESS NOTE     Jeffery Andrews  [id number]  DOB: [birth year]   DOA: 07/17/2020  Referring Physician: Satira Sark, MD  HPI: Jeffery Andrews is a 45 y.o. male being followed for ventilator/airway/oxygen weaning Acute on Chronic Respiratory Failure.  Remains on T collar good saturations are noted secretions are fair  Medications: Reviewed on Rounds  Physical Exam:  Vitals: Temperature is 97.8 pulse 70 respiratory is 19 blood pressure is 146/79 saturations 99%  Ventilator Settings currently on T collar FiO2 28%  General: Comfortable at this time Neck: supple Cardiovascular: no malignant arrhythmias Respiratory: No rhonchi very coarse breath sounds Skin: no rash seen on limited exam Musculoskeletal: No gross abnormality Psychiatric:unable to assess Neurologic:no involuntary movements         Lab Data:   Basic Metabolic Panel: Recent Labs  Lab 09/25/20 0618 09/28/20 0810 09/29/20 0624 09/30/20 0446  NA 134* 132*  --  133*  K 4.9 6.5* 4.8 5.6*  CL 93* 93*  --  94*  CO2 27 24  --  26  GLUCOSE 211* 169*  --  219*  BUN 71* 97*  --  74*  CREATININE 3.58* 4.45*  --  3.76*  CALCIUM 9.1 9.1  --  8.8*  PHOS 2.6 3.2  --  4.1    ABG: No results for input(s): PHART, PCO2ART, PO2ART, HCO3, O2SAT in the last 168 hours.  Liver Function Tests: Recent Labs  Lab 09/25/20 0618 09/28/20 0810 09/30/20 0446  ALBUMIN 1.6* 1.5* 1.5*   No results for input(s): LIPASE, AMYLASE in the last 168 hours. No results for input(s): AMMONIA in the last 168 hours.  CBC: Recent Labs  Lab 09/25/20 0618 09/28/20 0810 09/29/20 0624 09/30/20 0446  WBC 10.3 20.9* 24.6* 25.4*  HGB 7.5* 7.9* 6.7* 8.4*  HCT 24.3* 25.3* 22.7* 26.8*  MCV 79.4* 78.1* 79.9* 79.1*  PLT 313 263 268 263    Cardiac Enzymes: No results for input(s): CKTOTAL, CKMB, CKMBINDEX, TROPONINI in the last  168 hours.  BNP (last 3 results) No results for input(s): BNP in the last 8760 hours.  ProBNP (last 3 results) No results for input(s): PROBNP in the last 8760 hours.  Radiological Exams: DG Chest Port 1 View  Result Date: 09/29/2020 CLINICAL DATA:  Elevated WBC EXAM: PORTABLE CHEST 1 VIEW COMPARISON:  09/18/2020 FINDINGS: Rotated examination. The heart size and mediastinal contours are within normal limits. Tracheostomy. Both lungs are clear. The visualized skeletal structures are unremarkable. IMPRESSION: Tracheostomy without acute abnormality of the lungs in rotated AP portable projection. Electronically Signed   By: Eddie Candle M.D.   On: 09/29/2020 12:59    Assessment/Plan Active Problems:   Acute on chronic respiratory failure with hypoxia (HCC)   End stage renal disease on dialysis (HCC)   Lobar pneumonia, unspecified organism (HCC)   Chronic diastolic CHF (congestive heart failure) (HCC)   Acute on chronic respiratory failure with hypoxia we will continue with the T-piece titrate oxygen as tolerated continue secretion management pulmonary toilet. End-stage renal disease on hemodialysis we will continue to follow along closely Lobar pneumonia treated Chronic diastolic heart failure appears to be compensated Tracheostomy right now remains in place   I have personally seen and evaluated the patient, evaluated laboratory and imaging results, formulated the assessment and plan and placed orders. The Patient requires high complexity decision making with multiple systems involvement.  Rounds were done with the  Respiratory Therapy Director and Staff therapists and discussed with nursing staff also.  Allyne Gee, MD [facility] Pulmonary Critical Care Medicine Sleep Medicine

## 2020-10-02 DIAGNOSIS — I5032 Chronic diastolic (congestive) heart failure: Secondary | ICD-10-CM | POA: Diagnosis not present

## 2020-10-02 DIAGNOSIS — N186 End stage renal disease: Secondary | ICD-10-CM | POA: Diagnosis not present

## 2020-10-02 DIAGNOSIS — J181 Lobar pneumonia, unspecified organism: Secondary | ICD-10-CM | POA: Diagnosis not present

## 2020-10-02 DIAGNOSIS — J9621 Acute and chronic respiratory failure with hypoxia: Secondary | ICD-10-CM | POA: Diagnosis not present

## 2020-10-02 LAB — RENAL FUNCTION PANEL
Albumin: 1.6 g/dL — ABNORMAL LOW (ref 3.5–5.0)
Anion gap: 16 — ABNORMAL HIGH (ref 5–15)
BUN: 64 mg/dL — ABNORMAL HIGH (ref 6–20)
CO2: 24 mmol/L (ref 22–32)
Calcium: 9.1 mg/dL (ref 8.9–10.3)
Chloride: 94 mmol/L — ABNORMAL LOW (ref 98–111)
Creatinine, Ser: 3.38 mg/dL — ABNORMAL HIGH (ref 0.61–1.24)
GFR, Estimated: 22 mL/min — ABNORMAL LOW (ref >60)
Glucose, Bld: 141 mg/dL — ABNORMAL HIGH (ref 70–99)
Phosphorus: 4.1 mg/dL (ref 2.5–4.6)
Potassium: 5 mmol/L (ref 3.5–5.1)
Sodium: 134 mmol/L — ABNORMAL LOW (ref 135–145)

## 2020-10-02 LAB — CBC
HCT: 23.6 % — ABNORMAL LOW (ref 39.0–52.0)
Hemoglobin: 7.1 g/dL — ABNORMAL LOW (ref 13.0–17.0)
MCH: 24.6 pg — ABNORMAL LOW (ref 26.0–34.0)
MCHC: 30.1 g/dL (ref 30.0–36.0)
MCV: 81.7 fL (ref 80.0–100.0)
Platelets: 248 10*3/uL (ref 150–400)
RBC: 2.89 MIL/uL — ABNORMAL LOW (ref 4.22–5.81)
RDW: 20.2 % — ABNORMAL HIGH (ref 11.5–15.5)
WBC: 24.4 10*3/uL — ABNORMAL HIGH (ref 4.0–10.5)
nRBC: 0 % (ref 0.0–0.2)

## 2020-10-02 LAB — CULTURE, BLOOD (ROUTINE X 2): Special Requests: ADEQUATE

## 2020-10-02 LAB — VANCOMYCIN, TROUGH: Vancomycin Tr: 20 ug/mL (ref 15–20)

## 2020-10-02 NOTE — Progress Notes (Signed)
[facility]  ROUNDING NOTE   Subjective:  Patient about to undergo hemodialysis treatment. Appears to be resting comfortably. Hemoglobin down a bit to 7.1. Required blood transfusion earlier in the week. Potassium down to 5.0.  Objective:  Vital signs in last 24 hours:  Temperature 98.1 pulse 76 respirations 15 blood pressure 136/79  Physical Exam: General:  No acute distress  Head:  Normocephalic, atraumatic.    Eyes:  Anicteric  Neck:  Tracheostomy in place  Lungs:   Scattered rhonchi, normal effort  Heart:  S1S2 no rubs  Abdomen:   Soft, nontender, bowel sounds present  Extremities:  No peripheral edema in lower extremities  Neurologic:  Bilateral hand contractures, not following commands  Skin:  No acute skin rash  Access:  Left upper extremity AV fistula, left upper extremity swelling noted    Basic Metabolic Panel: Recent Labs  Lab 09/28/20 0810 09/29/20 0624 09/30/20 0446 10/02/20 0436  NA 132*  --  133* 134*  K 6.5* 4.8 5.6* 5.0  CL 93*  --  94* 94*  CO2 24  --  26 24  GLUCOSE 169*  --  219* 141*  BUN 97*  --  74* 64*  CREATININE 4.45*  --  3.76* 3.38*  CALCIUM 9.1  --  8.8* 9.1  PHOS 3.2  --  4.1 4.1     Liver Function Tests: Recent Labs  Lab 09/28/20 0810 09/30/20 0446 10/02/20 0436  ALBUMIN 1.5* 1.5* 1.6*    No results for input(s): LIPASE, AMYLASE in the last 168 hours. No results for input(s): AMMONIA in the last 168 hours.  CBC: Recent Labs  Lab 09/28/20 0810 09/29/20 0624 09/30/20 0446 10/02/20 0436  WBC 20.9* 24.6* 25.4* 24.4*  HGB 7.9* 6.7* 8.4* 7.1*  HCT 25.3* 22.7* 26.8* 23.6*  MCV 78.1* 79.9* 79.1* 81.7  PLT 263 268 263 248     Cardiac Enzymes: No results for input(s): CKTOTAL, CKMB, CKMBINDEX, TROPONINI in the last 168 hours.  BNP: Invalid input(s): POCBNP  CBG: No results for input(s): GLUCAP in the last 168 hours.  Microbiology: Results for orders placed or performed during the hospital encounter  of 07/17/20  Culture, blood (routine x 2)     Status: None   Collection Time: 07/27/20 11:17 AM   Specimen: BLOOD RIGHT ARM  Result Value Ref Range Status   Specimen Description BLOOD RIGHT ARM  Final   Special Requests   Final    BOTTLES DRAWN AEROBIC ONLY Blood Culture results may not be optimal due to an inadequate volume of blood received in culture bottles   Culture   Final    NO GROWTH 5 DAYS Performed at [facility], [place] [address], [place], [place] [address]    Report Status 08/01/2020 FINAL  Final  Culture, blood (routine x 2)     Status: None   Collection Time: 07/27/20 11:23 AM   Specimen: BLOOD RIGHT HAND  Result Value Ref Range Status   Specimen Description BLOOD RIGHT HAND  Final   Special Requests   Final    BOTTLES DRAWN AEROBIC ONLY Blood Culture results may not be optimal due to an inadequate volume of blood received in culture bottles   Culture   Final    NO GROWTH 5 DAYS Performed at [facility], [place] [address] [place], [place] [address]    Report Status 08/01/2020 FINAL  Final  Culture, blood (routine x 2)     Status: None  Collection Time: 08/01/20  2:25 PM   Specimen: BLOOD RIGHT HAND  Result Value Ref Range Status   Specimen Description BLOOD RIGHT HAND  Final   Special Requests   Final    BOTTLES DRAWN AEROBIC AND ANAEROBIC Blood Culture results may not be optimal due to an inadequate volume of blood received in culture bottles   Culture   Final    NO GROWTH 5 DAYS Performed at [facility], [place] [address], [place], [place] [address]    Report Status 08/06/2020 FINAL  Final  Culture, blood (routine x 2)     Status: None   Collection Time: 08/01/20  2:39 PM   Specimen: BLOOD RIGHT ARM  Result Value Ref Range Status   Specimen Description BLOOD RIGHT ARM  Final   Special Requests   Final    BOTTLES DRAWN AEROBIC ONLY Blood Culture results may not be optimal due to an inadequate volume of blood received in culture bottles    Culture   Final    NO GROWTH 5 DAYS Performed at [facility], [place] [address], [place], [place] [address]    Report Status 08/06/2020 FINAL  Final  Culture, Respiratory w Gram Stain     Status: None   Collection Time: 08/01/20  2:55 PM   Specimen: Tracheal Aspirate; Respiratory  Result Value Ref Range Status   Specimen Description TRACHEAL ASPIRATE  Final   Special Requests NONE  Final   Gram Stain   Final    FEW WBC PRESENT, PREDOMINANTLY PMN MODERATE GRAM NEGATIVE RODS Performed at [facility], [place] [address] [place], [place] [address]    Culture ABUNDANT PSEUDOMONAS AERUGINOSA  Final   Report Status 08/04/2020 FINAL  Final   Organism ID, Bacteria PSEUDOMONAS AERUGINOSA  Final      Susceptibility   Pseudomonas aeruginosa - MIC*    CEFTAZIDIME <=1 SENSITIVE Sensitive     CIPROFLOXACIN <=0.25 SENSITIVE Sensitive     GENTAMICIN 8 INTERMEDIATE Intermediate     IMIPENEM >=16 RESISTANT Resistant     PIP/TAZO <=4 SENSITIVE Sensitive     CEFEPIME 2 SENSITIVE Sensitive     * ABUNDANT PSEUDOMONAS AERUGINOSA  Culture, Respiratory w Gram Stain     Status: None   Collection Time: 09/17/20  5:21 PM   Specimen: Tracheal Aspirate; Respiratory  Result Value Ref Range Status   Specimen Description TRACHEAL ASPIRATE  Final   Special Requests NONE  Final   Gram Stain   Final    ABUNDANT WBC PRESENT,BOTH PMN AND MONONUCLEAR MODERATE SQUAMOUS EPITHELIAL CELLS PRESENT MODERATE GRAM POSITIVE COCCI MODERATE GRAM NEGATIVE RODS Performed at [facility], [place] [address] [place], [place] [address]    Culture FEW PSEUDOMONAS AERUGINOSA  Final   Report Status 09/22/2020 FINAL  Final   Organism ID, Bacteria PSEUDOMONAS AERUGINOSA  Final      Susceptibility   Pseudomonas aeruginosa - MIC*    CEFTAZIDIME 2 SENSITIVE Sensitive     CIPROFLOXACIN 1 SENSITIVE Sensitive     GENTAMICIN >=16 RESISTANT Resistant     IMIPENEM >=16 RESISTANT Resistant     PIP/TAZO <=4 SENSITIVE Sensitive      * FEW PSEUDOMONAS AERUGINOSA  Culture, blood (routine x 2)     Status: None   Collection Time: 09/18/20 12:05 PM   Specimen: BLOOD RIGHT ARM  Result Value Ref Range Status   Specimen Description BLOOD RIGHT ARM  Final   Special Requests   Final  BOTTLES DRAWN AEROBIC AND ANAEROBIC Blood Culture adequate volume   Culture   Final    NO GROWTH 5 DAYS Performed at [facility], [place] [address] [place], [place] [address]    Report Status 09/23/2020 FINAL  Final  Culture, blood (routine x 2)     Status: None   Collection Time: 09/18/20 12:05 PM   Specimen: BLOOD RIGHT HAND  Result Value Ref Range Status   Specimen Description BLOOD RIGHT HAND  Final   Special Requests   Final    BOTTLES DRAWN AEROBIC AND ANAEROBIC Blood Culture adequate volume   Culture   Final    NO GROWTH 5 DAYS Performed at [facility], [place] [address], [place], [place] [address]    Report Status 09/23/2020 FINAL  Final  Culture, Respiratory w Gram Stain     Status: None (Preliminary result)   Collection Time: 09/29/20 12:06 PM   Specimen: Tracheal Aspirate; Respiratory  Result Value Ref Range Status   Specimen Description TRACHEAL ASPIRATE  Final   Special Requests NONE  Final   Gram Stain   Final    RARE SQUAMOUS EPITHELIAL CELLS PRESENT ABUNDANT WBC PRESENT,BOTH PMN AND MONONUCLEAR ABUNDANT GRAM NEGATIVE RODS    Culture   Final    FEW PSEUDOMONAS AERUGINOSA SUSCEPTIBILITIES TO FOLLOW Performed at [facility], [place] [address], [place], [place] [address]    Report Status PENDING  Incomplete  Culture, blood (routine x 2)     Status: Abnormal   Collection Time: 09/29/20  1:07 PM   Specimen: BLOOD RIGHT ARM  Result Value Ref Range Status   Specimen Description BLOOD RIGHT ARM  Final   Special Requests   Final    BOTTLES DRAWN AEROBIC AND ANAEROBIC Blood Culture adequate volume   Culture  Setup Time   Final    GRAM POSITIVE COCCI IN CHAINS AEROBIC BOTTLE ONLY CRITICAL RESULT  CALLED TO, READ BACK BY AND VERIFIED WITH: RN S.SNED AT 1233 ON 09/30/2020 BY T.SAAD. Performed at [facility], [place] [address], [place], [place] [address]    Culture VANCOMYCIN RESISTANT ENTEROCOCCUS (A)  Final   Report Status 10/02/2020 FINAL  Final   Organism ID, Bacteria VANCOMYCIN RESISTANT ENTEROCOCCUS  Final      Susceptibility   Vancomycin resistant enterococcus - MIC*    AMPICILLIN <=2 SENSITIVE Sensitive     VANCOMYCIN >=32 RESISTANT Resistant     GENTAMICIN SYNERGY SENSITIVE Sensitive     LINEZOLID 2 SENSITIVE Sensitive     * VANCOMYCIN RESISTANT ENTEROCOCCUS  Blood Culture ID Panel (Reflexed)     Status: Abnormal   Collection Time: 09/29/20  1:07 PM  Result Value Ref Range Status   Enterococcus faecalis DETECTED (A) NOT DETECTED Final    Comment: CRITICAL RESULT CALLED TO, READ BACK BY AND VERIFIED WITH: RN S.SNED AT 1233 ON 09/30/2020 BY T.SAAD.    Enterococcus Faecium NOT DETECTED NOT DETECTED Final   Listeria monocytogenes NOT DETECTED NOT DETECTED Final   Staphylococcus species NOT DETECTED NOT DETECTED Final   Staphylococcus aureus (BCID) NOT DETECTED NOT DETECTED Final   Staphylococcus epidermidis NOT DETECTED NOT DETECTED Final   Staphylococcus lugdunensis NOT DETECTED NOT DETECTED Final   Streptococcus species NOT DETECTED NOT DETECTED Final   Streptococcus agalactiae NOT DETECTED NOT DETECTED Final   Streptococcus pneumoniae NOT DETECTED NOT DETECTED Final   Streptococcus pyogenes NOT DETECTED NOT DETECTED Final   A.calcoaceticus-baumannii NOT DETECTED NOT DETECTED Final   Bacteroides  fragilis NOT DETECTED NOT DETECTED Final   Enterobacterales NOT DETECTED NOT DETECTED Final   Enterobacter cloacae complex NOT DETECTED NOT DETECTED Final   Escherichia coli NOT DETECTED NOT DETECTED Final   Klebsiella aerogenes NOT DETECTED NOT DETECTED Final   Klebsiella oxytoca NOT DETECTED NOT DETECTED Final   Klebsiella pneumoniae NOT DETECTED NOT DETECTED Final    Proteus species NOT DETECTED NOT DETECTED Final   Salmonella species NOT DETECTED NOT DETECTED Final   Serratia marcescens NOT DETECTED NOT DETECTED Final   Haemophilus influenzae NOT DETECTED NOT DETECTED Final   Neisseria meningitidis NOT DETECTED NOT DETECTED Final   Pseudomonas aeruginosa NOT DETECTED NOT DETECTED Final   Stenotrophomonas maltophilia NOT DETECTED NOT DETECTED Final   Candida albicans NOT DETECTED NOT DETECTED Final   Candida auris NOT DETECTED NOT DETECTED Final   Candida glabrata NOT DETECTED NOT DETECTED Final   Candida krusei NOT DETECTED NOT DETECTED Final   Candida parapsilosis NOT DETECTED NOT DETECTED Final   Candida tropicalis NOT DETECTED NOT DETECTED Final   Cryptococcus neoformans/gattii NOT DETECTED NOT DETECTED Final   Vancomycin resistance DETECTED (A) NOT DETECTED Final    Comment: CRITICAL RESULT CALLED TO, READ BACK BY AND VERIFIED WITH: RN S.SNED AT 1233 ON 09/30/2020 BY T.SAAD. Performed at [facility], [place] [address] [place], [place] [address]   Culture, blood (routine x 2)     Status: None (Preliminary result)   Collection Time: 09/29/20  1:14 PM   Specimen: BLOOD RIGHT ARM  Result Value Ref Range Status   Specimen Description BLOOD RIGHT ARM  Final   Special Requests   Final    BOTTLES DRAWN AEROBIC AND ANAEROBIC Blood Culture adequate volume   Culture   Final    NO GROWTH 3 DAYS Performed at [facility], [address], [place], [place] [address]    Report Status PENDING  Incomplete    Coagulation Studies: No results for input(s): LABPROT, INR in the last 72 hours.   Urinalysis: No results for input(s): COLORURINE, LABSPEC, PHURINE, GLUCOSEU, HGBUR, BILIRUBINUR, KETONESUR, PROTEINUR, UROBILINOGEN, NITRITE, LEUKOCYTESUR in the last 72 hours.  Invalid input(s): APPERANCEUR    Imaging: No results found.   Medications:       Assessment/ Plan:  45 y.o. male with a PMHx of ESRD on HD, posterior reversible  encephalopathy syndrome, diabetes mellitus type 2, chronic systolic heart failure, anemia of chronic kidney disease, secondary hyperparathyroidism, malignant hypertension, acute respiratory failure, who was admitted to [facility] on 06/29/2020 for ongoing treatment of acute respiratory failure, posterior reversible encephalopathy syndrome, and end-stage renal disease.   1.  ESRD on HD.  Patient due for hemodialysis treatment today which we will conduct shortly.  Electrolytes improved his potassium down to 5.0.  Serum bicarbonate acceptable at 24.  2.  Anemia of chronic kidney disease.  Hemoglobin is drifted down to 7.1 again.  Consider blood transfusion for hemoglobin of 7 or less.  3.  Secondary hyperparathyroidism.  Phosphorus remains stable at 4.1.  We will continue to monitor.  4.  Acute respiratory failure.  Tracheostomy placed 07/17/2020.  Currently on aerosolized trach collar.  5.  Hypertension.  Continue on amlodipine, clonidine, hydralazine, labetalol, and losartan.    LOS: 0 Eura Mccauslin 9/30/20224:11 PM

## 2020-10-02 NOTE — Progress Notes (Signed)
[facility] [place]  PROGRESS NOTE     Jeffery Andrews  [id number]  DOB: [birth year]   DOA: 07/17/2020  Referring Physician: Satira Sark, MD  HPI: Jeffery Andrews is a 45 y.o. male being followed for ventilator/airway/oxygen weaning Acute on Chronic Respiratory Failure.  Patient is on T collar currently on 28% FiO2 with good saturations  Medications: Reviewed on Rounds  Physical Exam:  Vitals: Temperature is 98.1 pulse 76 respiratory rate is 15 blood pressure is 115/70 saturations 100%  Ventilator Settings on T collar FiO2 28%  General: Comfortable at this time Neck: supple Cardiovascular: no malignant arrhythmias Respiratory: Coarse rhonchi expansion is equal Skin: no rash seen on limited exam Musculoskeletal: No gross abnormality Psychiatric:unable to assess Neurologic:no involuntary movements         Lab Data:   Basic Metabolic Panel: Recent Labs  Lab 09/28/20 0810 09/29/20 0624 09/30/20 0446 10/02/20 0436  NA 132*  --  133* 134*  K 6.5* 4.8 5.6* 5.0  CL 93*  --  94* 94*  CO2 24  --  26 24  GLUCOSE 169*  --  219* 141*  BUN 97*  --  74* 64*  CREATININE 4.45*  --  3.76* 3.38*  CALCIUM 9.1  --  8.8* 9.1  PHOS 3.2  --  4.1 4.1    ABG: No results for input(s): PHART, PCO2ART, PO2ART, HCO3, O2SAT in the last 168 hours.  Liver Function Tests: Recent Labs  Lab 09/28/20 0810 09/30/20 0446 10/02/20 0436  ALBUMIN 1.5* 1.5* 1.6*   No results for input(s): LIPASE, AMYLASE in the last 168 hours. No results for input(s): AMMONIA in the last 168 hours.  CBC: Recent Labs  Lab 09/28/20 0810 09/29/20 0624 09/30/20 0446 10/02/20 0436  WBC 20.9* 24.6* 25.4* 24.4*  HGB 7.9* 6.7* 8.4* 7.1*  HCT 25.3* 22.7* 26.8* 23.6*  MCV 78.1* 79.9* 79.1* 81.7  PLT 263 268 263 248    Cardiac Enzymes: No results for input(s): CKTOTAL, CKMB, CKMBINDEX, TROPONINI in the last 168  hours.  BNP (last 3 results) No results for input(s): BNP in the last 8760 hours.  ProBNP (last 3 results) No results for input(s): PROBNP in the last 8760 hours.  Radiological Exams: No results found.  Assessment/Plan Active Problems:   Acute on chronic respiratory failure with hypoxia (HCC)   End stage renal disease on dialysis (HCC)   Lobar pneumonia, unspecified organism (HCC)   Chronic diastolic CHF (congestive heart failure) (HCC)   Acute on chronic respiratory failure hypoxia doing fine with the T collar plan is going to be to continue with T-piece End-stage renal failure on hemodialysis Lobar pneumonia treated slow improvement Chronic diastolic heart failure compensated we will continue to follow along closely. Tracheostomy remains in place   I have personally seen and evaluated the patient, evaluated laboratory and imaging results, formulated the assessment and plan and placed orders. The Patient requires high complexity decision making with multiple systems involvement.  Rounds were done with the Respiratory Therapy Director and Staff therapists and discussed with nursing staff also.  Allyne Gee, MD [facility] Pulmonary Critical Care Medicine Sleep Medicine

## 2020-10-03 ENCOUNTER — Other Ambulatory Visit (HOSPITAL_COMMUNITY): Payer: Medicare Other

## 2020-10-03 DIAGNOSIS — N186 End stage renal disease: Secondary | ICD-10-CM | POA: Diagnosis not present

## 2020-10-03 DIAGNOSIS — J9621 Acute and chronic respiratory failure with hypoxia: Secondary | ICD-10-CM | POA: Diagnosis not present

## 2020-10-03 DIAGNOSIS — I5032 Chronic diastolic (congestive) heart failure: Secondary | ICD-10-CM | POA: Diagnosis not present

## 2020-10-03 DIAGNOSIS — J181 Lobar pneumonia, unspecified organism: Secondary | ICD-10-CM | POA: Diagnosis not present

## 2020-10-03 LAB — CULTURE, RESPIRATORY W GRAM STAIN

## 2020-10-03 NOTE — Progress Notes (Signed)
Pulmonary [place]  PROGRESS NOTE     Jeffery Andrews  [id number]  DOB: 11-05-1975   DOA: 07/17/2020  Referring Physician: Satira Sark, MD  HPI: Jeffery Andrews is a 45 y.o. male being followed for ventilator/airway/oxygen weaning Acute on Chronic Respiratory Failure.  Patient currently is on T collar on 28% FiO2 secretions are good  Medications: Reviewed on Rounds  Physical Exam:  Vitals: Temperature is 97.4 pulse 75 respiratory rate is 15 blood pressure is 147/80 saturations 99%  Ventilator Settings patient is on T collar on 28% FiO2  General: Comfortable at this time Neck: supple Cardiovascular: no malignant arrhythmias Respiratory: Scattered rhonchi expansion is equal Skin: no rash seen on limited exam Musculoskeletal: No gross abnormality Psychiatric:unable to assess Neurologic:no involuntary movements         Lab Data:   Basic Metabolic Panel: Recent Labs  Lab 09/28/20 0810 09/29/20 0624 09/30/20 0446 10/02/20 0436  NA 132*  --  133* 134*  K 6.5* 4.8 5.6* 5.0  CL 93*  --  94* 94*  CO2 24  --  26 24  GLUCOSE 169*  --  219* 141*  BUN 97*  --  74* 64*  CREATININE 4.45*  --  3.76* 3.38*  CALCIUM 9.1  --  8.8* 9.1  PHOS 3.2  --  4.1 4.1    ABG: No results for input(s): PHART, PCO2ART, PO2ART, HCO3, O2SAT in the last 168 hours.  Liver Function Tests: Recent Labs  Lab 09/28/20 0810 09/30/20 0446 10/02/20 0436  ALBUMIN 1.5* 1.5* 1.6*   No results for input(s): LIPASE, AMYLASE in the last 168 hours. No results for input(s): AMMONIA in the last 168 hours.  CBC: Recent Labs  Lab 09/28/20 0810 09/29/20 0624 09/30/20 0446 10/02/20 0436  WBC 20.9* 24.6* 25.4* 24.4*  HGB 7.9* 6.7* 8.4* 7.1*  HCT 25.3* 22.7* 26.8* 23.6*  MCV 78.1* 79.9* 79.1* 81.7  PLT 263 268 263 248    Cardiac Enzymes: No results for input(s): CKTOTAL, CKMB, CKMBINDEX, TROPONINI in  the last 168 hours.  BNP (last 3 results) No results for input(s): BNP in the last 8760 hours.  ProBNP (last 3 results) No results for input(s): PROBNP in the last 8760 hours.  Radiological Exams: No results found.  Assessment/Plan Active Problems:   Acute on chronic respiratory failure with hypoxia (HCC)   End stage renal disease on dialysis (HCC)   Lobar pneumonia, unspecified organism (HCC)   Chronic diastolic CHF (congestive heart failure) (HCC)   Acute on chronic respiratory failure with hypoxia we will continue with the T-piece patient is doing fine with T-piece in place End-stage renal failure on hemodialysis Lobar pneumonia treated slow improvement Chronic diastolic heart failure compensated Tracheostomy remains in place   I have personally seen and evaluated the patient, evaluated laboratory and imaging results, formulated the assessment and plan and placed orders. The Patient requires high complexity decision making with multiple systems involvement.  Rounds were done with the Respiratory Therapy Director and Staff therapists and discussed with nursing staff also.  Allyne Gee, MD [facility] Pulmonary Critical Care Medicine Sleep Medicine

## 2020-10-04 LAB — POTASSIUM: Potassium: 4.6 mmol/L (ref 3.5–5.1)

## 2020-10-04 LAB — CULTURE, BLOOD (ROUTINE X 2)
Culture: NO GROWTH
Special Requests: ADEQUATE

## 2020-10-05 LAB — CBC
HCT: 27.1 % — ABNORMAL LOW (ref 39.0–52.0)
Hemoglobin: 8 g/dL — ABNORMAL LOW (ref 13.0–17.0)
MCH: 24.1 pg — ABNORMAL LOW (ref 26.0–34.0)
MCHC: 29.5 g/dL — ABNORMAL LOW (ref 30.0–36.0)
MCV: 81.6 fL (ref 80.0–100.0)
Platelets: 284 10*3/uL (ref 150–400)
RBC: 3.32 MIL/uL — ABNORMAL LOW (ref 4.22–5.81)
RDW: 19.7 % — ABNORMAL HIGH (ref 11.5–15.5)
WBC: 16.9 10*3/uL — ABNORMAL HIGH (ref 4.0–10.5)
nRBC: 0 % (ref 0.0–0.2)

## 2020-10-05 LAB — RENAL FUNCTION PANEL
Albumin: 1.5 g/dL — ABNORMAL LOW (ref 3.5–5.0)
Anion gap: 14 (ref 5–15)
BUN: 70 mg/dL — ABNORMAL HIGH (ref 6–20)
CO2: 24 mmol/L (ref 22–32)
Calcium: 8.7 mg/dL — ABNORMAL LOW (ref 8.9–10.3)
Chloride: 89 mmol/L — ABNORMAL LOW (ref 98–111)
Creatinine, Ser: 3.62 mg/dL — ABNORMAL HIGH (ref 0.61–1.24)
GFR, Estimated: 20 mL/min — ABNORMAL LOW (ref >60)
Glucose, Bld: 178 mg/dL — ABNORMAL HIGH (ref 70–99)
Phosphorus: 5.7 mg/dL — ABNORMAL HIGH (ref 2.5–4.6)
Potassium: 5.1 mmol/L (ref 3.5–5.1)
Sodium: 127 mmol/L — ABNORMAL LOW (ref 135–145)

## 2020-10-05 NOTE — Progress Notes (Signed)
[facility]  ROUNDING NOTE   Subjective:  Patient sitting up in chair this AM. No significant change in neurologic status. Due for dialysis treatment today.  Objective:  Vital signs in last 24 hours:  Temperature 96 pulse 64 respirations 13 blood pressure 155/81  Physical Exam: General:  No acute distress  Head:  Normocephalic, atraumatic.    Eyes:  Anicteric  Neck:  Tracheostomy in place  Lungs:   Scattered rhonchi, normal effort  Heart:  S1S2 no rubs  Abdomen:   Soft, nontender, bowel sounds present  Extremities:  No peripheral edema in lower extremities  Neurologic:  Bilateral hand contractures, not following commands  Skin:  No acute skin rash  Access:  Left upper extremity AV fistula, left upper extremity swelling noted    Basic Metabolic Panel: Recent Labs  Lab 09/29/20 0624 09/30/20 0446 10/02/20 0436 10/04/20 0830 10/05/20 0410  NA  --  133* 134*  --  127*  K 4.8 5.6* 5.0 4.6 5.1  CL  --  94* 94*  --  89*  CO2  --  26 24  --  24  GLUCOSE  --  219* 141*  --  178*  BUN  --  74* 64*  --  70*  CREATININE  --  3.76* 3.38*  --  3.62*  CALCIUM  --  8.8* 9.1  --  8.7*  PHOS  --  4.1 4.1  --  5.7*     Liver Function Tests: Recent Labs  Lab 09/30/20 0446 10/02/20 0436 10/05/20 0410  ALBUMIN 1.5* 1.6* 1.5*    No results for input(s): LIPASE, AMYLASE in the last 168 hours. No results for input(s): AMMONIA in the last 168 hours.  CBC: Recent Labs  Lab 09/29/20 0624 09/30/20 0446 10/02/20 0436 10/05/20 0410  WBC 24.6* 25.4* 24.4* 16.9*  HGB 6.7* 8.4* 7.1* 8.0*  HCT 22.7* 26.8* 23.6* 27.1*  MCV 79.9* 79.1* 81.7 81.6  PLT 268 263 248 284     Cardiac Enzymes: No results for input(s): CKTOTAL, CKMB, CKMBINDEX, TROPONINI in the last 168 hours.  BNP: Invalid input(s): POCBNP  CBG: No results for input(s): GLUCAP in the last 168 hours.  Microbiology: Results for orders placed or performed during the hospital encounter of 07/17/20   Culture, blood (routine x 2)     Status: None   Collection Time: 07/27/20 11:17 AM   Specimen: BLOOD RIGHT ARM  Result Value Ref Range Status   Specimen Description BLOOD RIGHT ARM  Final   Special Requests   Final    BOTTLES DRAWN AEROBIC ONLY Blood Culture results may not be optimal due to an inadequate volume of blood received in culture bottles   Culture   Final    NO GROWTH 5 DAYS Performed at [facility], [place] [address] [place], [place] [address]    Report Status 08/01/2020 FINAL  Final  Culture, blood (routine x 2)     Status: None   Collection Time: 07/27/20 11:23 AM   Specimen: BLOOD RIGHT HAND  Result Value Ref Range Status   Specimen Description BLOOD RIGHT HAND  Final   Special Requests   Final    BOTTLES DRAWN AEROBIC ONLY Blood Culture results may not be optimal due to an inadequate volume of blood received in culture bottles   Culture   Final    NO GROWTH 5 DAYS Performed at [facility], [place] [address], [place], [place] [address]    Report Status 08/01/2020  FINAL  Final  Culture, blood (routine x 2)     Status: None   Collection Time: 08/01/20  2:25 PM   Specimen: BLOOD RIGHT HAND  Result Value Ref Range Status   Specimen Description BLOOD RIGHT HAND  Final   Special Requests   Final    BOTTLES DRAWN AEROBIC AND ANAEROBIC Blood Culture results may not be optimal due to an inadequate volume of blood received in culture bottles   Culture   Final    NO GROWTH 5 DAYS Performed at [facility], [place] [address] [place], [place] [address]    Report Status 08/06/2020 FINAL  Final  Culture, blood (routine x 2)     Status: None   Collection Time: 08/01/20  2:39 PM   Specimen: BLOOD RIGHT ARM  Result Value Ref Range Status   Specimen Description BLOOD RIGHT ARM  Final   Special Requests   Final    BOTTLES DRAWN AEROBIC ONLY Blood Culture results may not be optimal due to an inadequate volume of blood received in culture bottles   Culture   Final     NO GROWTH 5 DAYS Performed at [facility], [place] [address] [place], [place] [address]    Report Status 08/06/2020 FINAL  Final  Culture, Respiratory w Gram Stain     Status: None   Collection Time: 08/01/20  2:55 PM   Specimen: Tracheal Aspirate; Respiratory  Result Value Ref Range Status   Specimen Description TRACHEAL ASPIRATE  Final   Special Requests NONE  Final   Gram Stain   Final    FEW WBC PRESENT, PREDOMINANTLY PMN MODERATE GRAM NEGATIVE RODS Performed at [facility], [place] [address], [place], [place] [address]    Culture ABUNDANT PSEUDOMONAS AERUGINOSA  Final   Report Status 08/04/2020 FINAL  Final   Organism ID, Bacteria PSEUDOMONAS AERUGINOSA  Final      Susceptibility   Pseudomonas aeruginosa - MIC*    CEFTAZIDIME <=1 SENSITIVE Sensitive     CIPROFLOXACIN <=0.25 SENSITIVE Sensitive     GENTAMICIN 8 INTERMEDIATE Intermediate     IMIPENEM >=16 RESISTANT Resistant     PIP/TAZO <=4 SENSITIVE Sensitive     CEFEPIME 2 SENSITIVE Sensitive     * ABUNDANT PSEUDOMONAS AERUGINOSA  Culture, Respiratory w Gram Stain     Status: None   Collection Time: 09/17/20  5:21 PM   Specimen: Tracheal Aspirate; Respiratory  Result Value Ref Range Status   Specimen Description TRACHEAL ASPIRATE  Final   Special Requests NONE  Final   Gram Stain   Final    ABUNDANT WBC PRESENT,BOTH PMN AND MONONUCLEAR MODERATE SQUAMOUS EPITHELIAL CELLS PRESENT MODERATE GRAM POSITIVE COCCI MODERATE GRAM NEGATIVE RODS Performed at [facility], [place] [address], [place],  [address]    Culture FEW PSEUDOMONAS AERUGINOSA  Final   Report Status 09/22/2020 FINAL  Final   Organism ID, Bacteria PSEUDOMONAS AERUGINOSA  Final      Susceptibility   Pseudomonas aeruginosa - MIC*    CEFTAZIDIME 2 SENSITIVE Sensitive     CIPROFLOXACIN 1 SENSITIVE Sensitive     GENTAMICIN >=16 RESISTANT Resistant     IMIPENEM >=16 RESISTANT Resistant     PIP/TAZO <=4 SENSITIVE Sensitive     * FEW  PSEUDOMONAS AERUGINOSA  Culture, blood (routine x 2)     Status: None   Collection Time: 09/18/20 12:05 PM   Specimen: BLOOD RIGHT ARM  Result Value Ref Range Status  Specimen Description BLOOD RIGHT ARM  Final   Special Requests   Final    BOTTLES DRAWN AEROBIC AND ANAEROBIC Blood Culture adequate volume   Culture   Final    NO GROWTH 5 DAYS Performed at [facility], [address], [place], [place] [address]    Report Status 09/23/2020 FINAL  Final  Culture, blood (routine x 2)     Status: None   Collection Time: 09/18/20 12:05 PM   Specimen: BLOOD RIGHT HAND  Result Value Ref Range Status   Specimen Description BLOOD RIGHT HAND  Final   Special Requests   Final    BOTTLES DRAWN AEROBIC AND ANAEROBIC Blood Culture adequate volume   Culture   Final    NO GROWTH 5 DAYS Performed at [facility], [place] [address], [place], [place] [address]    Report Status 09/23/2020 FINAL  Final  Culture, Respiratory w Gram Stain     Status: None   Collection Time: 09/29/20 12:06 PM   Specimen: Tracheal Aspirate; Respiratory  Result Value Ref Range Status   Specimen Description TRACHEAL ASPIRATE  Final   Special Requests NONE  Final   Gram Stain   Final    RARE SQUAMOUS EPITHELIAL CELLS PRESENT ABUNDANT WBC PRESENT,BOTH PMN AND MONONUCLEAR ABUNDANT GRAM NEGATIVE RODS    Culture   Final    FEW PSEUDOMONAS AERUGINOSA MULTI-DRUG RESISTANT ORGANISM CRITICAL RESULT CALLED TO, READ BACK BY AND VERIFIED WITH: [place] RN '@[id number]'$  10/03/20 EB Performed at [facility], [place] [address] [place],  [address]    Report Status 10/03/2020 FINAL  Final   Organism ID, Bacteria PSEUDOMONAS AERUGINOSA  Final      Susceptibility   Pseudomonas aeruginosa - MIC*    CEFTAZIDIME 16 INTERMEDIATE Intermediate     CIPROFLOXACIN 0.5 SENSITIVE Sensitive     GENTAMICIN >=16 RESISTANT Resistant     IMIPENEM >=16 RESISTANT Resistant     * FEW PSEUDOMONAS AERUGINOSA  Culture, blood (routine x  2)     Status: Abnormal   Collection Time: 09/29/20  1:07 PM   Specimen: BLOOD RIGHT ARM  Result Value Ref Range Status   Specimen Description BLOOD RIGHT ARM  Final   Special Requests   Final    BOTTLES DRAWN AEROBIC AND ANAEROBIC Blood Culture adequate volume   Culture  Setup Time   Final    GRAM POSITIVE COCCI IN CHAINS AEROBIC BOTTLE ONLY CRITICAL RESULT CALLED TO, READ BACK BY AND VERIFIED WITH: RN S.SNED AT 1233 ON 09/30/2020 BY T.SAAD. Performed at [facility], [place] [address], [place],  [address]    Culture VANCOMYCIN RESISTANT ENTEROCOCCUS (A)  Final   Report Status 10/02/2020 FINAL  Final   Organism ID, Bacteria VANCOMYCIN RESISTANT ENTEROCOCCUS  Final      Susceptibility   Vancomycin resistant enterococcus - MIC*    AMPICILLIN <=2 SENSITIVE Sensitive     VANCOMYCIN >=32 RESISTANT Resistant     GENTAMICIN SYNERGY SENSITIVE Sensitive     LINEZOLID 2 SENSITIVE Sensitive     * VANCOMYCIN RESISTANT ENTEROCOCCUS  Blood Culture ID Panel (Reflexed)     Status: Abnormal   Collection Time: 09/29/20  1:07 PM  Result Value Ref Range Status   Enterococcus faecalis DETECTED (A) NOT DETECTED Final    Comment: CRITICAL RESULT CALLED TO, READ BACK BY AND VERIFIED WITH: RN S.SNED AT 1233 ON 09/30/2020 BY T.SAAD.    Enterococcus Faecium NOT DETECTED NOT DETECTED Final  Listeria monocytogenes NOT DETECTED NOT DETECTED Final   Staphylococcus species NOT DETECTED NOT DETECTED Final   Staphylococcus aureus (BCID) NOT DETECTED NOT DETECTED Final   Staphylococcus epidermidis NOT DETECTED NOT DETECTED Final   Staphylococcus lugdunensis NOT DETECTED NOT DETECTED Final   Streptococcus species NOT DETECTED NOT DETECTED Final   Streptococcus agalactiae NOT DETECTED NOT DETECTED Final   Streptococcus pneumoniae NOT DETECTED NOT DETECTED Final   Streptococcus pyogenes NOT DETECTED NOT DETECTED Final   A.calcoaceticus-baumannii NOT DETECTED NOT DETECTED Final   Bacteroides fragilis  NOT DETECTED NOT DETECTED Final   Enterobacterales NOT DETECTED NOT DETECTED Final   Enterobacter cloacae complex NOT DETECTED NOT DETECTED Final   Escherichia coli NOT DETECTED NOT DETECTED Final   Klebsiella aerogenes NOT DETECTED NOT DETECTED Final   Klebsiella oxytoca NOT DETECTED NOT DETECTED Final   Klebsiella pneumoniae NOT DETECTED NOT DETECTED Final   Proteus species NOT DETECTED NOT DETECTED Final   Salmonella species NOT DETECTED NOT DETECTED Final   Serratia marcescens NOT DETECTED NOT DETECTED Final   Haemophilus influenzae NOT DETECTED NOT DETECTED Final   Neisseria meningitidis NOT DETECTED NOT DETECTED Final   Pseudomonas aeruginosa NOT DETECTED NOT DETECTED Final   Stenotrophomonas maltophilia NOT DETECTED NOT DETECTED Final   Candida albicans NOT DETECTED NOT DETECTED Final   Candida auris NOT DETECTED NOT DETECTED Final   Candida glabrata NOT DETECTED NOT DETECTED Final   Candida krusei NOT DETECTED NOT DETECTED Final   Candida parapsilosis NOT DETECTED NOT DETECTED Final   Candida tropicalis NOT DETECTED NOT DETECTED Final   Cryptococcus neoformans/gattii NOT DETECTED NOT DETECTED Final   Vancomycin resistance DETECTED (A) NOT DETECTED Final    Comment: CRITICAL RESULT CALLED TO, READ BACK BY AND VERIFIED WITH: RN S.SNED AT 1233 ON 09/30/2020 BY T.SAAD. Performed at [facility], [place] [address] [place], [place] [address]   Culture, blood (routine x 2)     Status: None   Collection Time: 09/29/20  1:14 PM   Specimen: BLOOD RIGHT ARM  Result Value Ref Range Status   Specimen Description BLOOD RIGHT ARM  Final   Special Requests   Final    BOTTLES DRAWN AEROBIC AND ANAEROBIC Blood Culture adequate volume   Culture   Final    NO GROWTH 5 DAYS Performed at [facility], [place] [address] [place], [place] [address]    Report Status 10/04/2020 FINAL  Final    Coagulation Studies: No results for input(s): LABPROT, INR in the last 72  hours.   Urinalysis: No results for input(s): COLORURINE, LABSPEC, PHURINE, GLUCOSEU, HGBUR, BILIRUBINUR, KETONESUR, PROTEINUR, UROBILINOGEN, NITRITE, LEUKOCYTESUR in the last 72 hours.  Invalid input(s): APPERANCEUR    Imaging: DG Chest Port 1 View  Result Date: 10/03/2020 CLINICAL DATA:  Central line placement. EXAM: PORTABLE CHEST 1 VIEW COMPARISON:  09/29/2020 FINDINGS: RIGHT IJ central line tip overlies the superior vena cava. Patient's tracheostomy tube, unchanged. Heart size is normal. Persistent mild airspace filling within the RIGHT UPPER lobe. Improved aeration in the LEFT mid lung zone. IMPRESSION: Interval placement of RIGHT IJ central line.  No pneumothorax. Electronically Signed   By: Nolon Nations M.D.   On: 10/03/2020 16:23     Medications:       Assessment/ Plan:  45 y.o. male with a PMHx of ESRD on HD, posterior reversible encephalopathy syndrome, diabetes mellitus type 2, chronic systolic heart failure, anemia of chronic kidney disease, secondary hyperparathyroidism, malignant hypertension, acute respiratory failure, who was  admitted to [facility] on 06/29/2020 for ongoing treatment of acute respiratory failure, posterior reversible encephalopathy syndrome, and end-stage renal disease.   1.  ESRD on HD.  Patient due for hemodialysis treatment today.  We will maintain the patient on MWF dialysis schedule.  2.  Anemia of chronic kidney disease.  Hemoglobin up a bit to 8.0.  Maintain the patient on Retacrit and follow CBC.  3.  Secondary hyperparathyroidism.  Serum phosphorus slightly high this a.m. but should come down with dialysis treatment.  Phosphorus 5.7 at the moment.  4.  Acute respiratory failure.  Tracheostomy placed 07/17/2020.  Continues on trach collar.  5.  Hypertension.  Continue on amlodipine, clonidine, hydralazine, labetalol, and losartan.  Blood pressure currently 155/81.    LOS: 0 Angellina Ferdinand 10/3/20228:10 AM

## 2020-10-06 ENCOUNTER — Other Ambulatory Visit (HOSPITAL_COMMUNITY): Payer: Medicare Other

## 2020-10-06 MED ORDER — IOHEXOL 350 MG/ML SOLN
75.0000 mL | Freq: Once | INTRAVENOUS | Status: AC | PRN
  Administered 2020-10-06: 75 mL via INTRAVENOUS

## 2020-10-07 LAB — CBC
HCT: 20.7 % — ABNORMAL LOW (ref 39.0–52.0)
Hemoglobin: 6.2 g/dL — CL (ref 13.0–17.0)
MCH: 24 pg — ABNORMAL LOW (ref 26.0–34.0)
MCHC: 30 g/dL (ref 30.0–36.0)
MCV: 80.2 fL (ref 80.0–100.0)
Platelets: 239 10*3/uL (ref 150–400)
RBC: 2.58 MIL/uL — ABNORMAL LOW (ref 4.22–5.81)
RDW: 18.7 % — ABNORMAL HIGH (ref 11.5–15.5)
WBC: 10.8 10*3/uL — ABNORMAL HIGH (ref 4.0–10.5)
nRBC: 0 % (ref 0.0–0.2)

## 2020-10-07 LAB — RENAL FUNCTION PANEL
Albumin: <1.5 g/dL — ABNORMAL LOW (ref 3.5–5.0)
Anion gap: 12 (ref 5–15)
BUN: 58 mg/dL — ABNORMAL HIGH (ref 6–20)
CO2: 26 mmol/L (ref 22–32)
Calcium: 8.3 mg/dL — ABNORMAL LOW (ref 8.9–10.3)
Chloride: 86 mmol/L — ABNORMAL LOW (ref 98–111)
Creatinine, Ser: 3.28 mg/dL — ABNORMAL HIGH (ref 0.61–1.24)
GFR, Estimated: 23 mL/min — ABNORMAL LOW (ref >60)
Glucose, Bld: 151 mg/dL — ABNORMAL HIGH (ref 70–99)
Phosphorus: 5.3 mg/dL — ABNORMAL HIGH (ref 2.5–4.6)
Potassium: 3.9 mmol/L (ref 3.5–5.1)
Sodium: 124 mmol/L — ABNORMAL LOW (ref 135–145)

## 2020-10-07 LAB — PREPARE RBC (CROSSMATCH)

## 2020-10-07 NOTE — Progress Notes (Signed)
[facility]  ROUNDING NOTE   Subjective:  Patient seen and evaluated during dialysis treatment.  UF target 1.5kg,  Receiving PRBC transfusion.   Objective:  Vital signs in last 24 hours:  Temperature 97.7 pulse 60 respirations 17 blood pressure 138/78  Physical Exam: General:  No acute distress  Head:  Normocephalic, atraumatic.    Eyes:  Anicteric  Neck:  Tracheostomy in place  Lungs:   Scattered rhonchi, normal effort  Heart:  S1S2 no rubs  Abdomen:   Soft, nontender, bowel sounds present  Extremities:  No peripheral edema in lower extremities  Neurologic:  Bilateral hand contractures, not following commands  Skin:  No acute skin rash  Access:  Left upper extremity AV fistula, left upper extremity swelling noted    Basic Metabolic Panel: Recent Labs  Lab 10/02/20 0436 10/04/20 0830 10/05/20 0410 10/07/20 0323  NA 134*  --  127* 124*  K 5.0 4.6 5.1 3.9  CL 94*  --  89* 86*  CO2 24  --  24 26  GLUCOSE 141*  --  178* 151*  BUN 64*  --  70* 58*  CREATININE 3.38*  --  3.62* 3.28*  CALCIUM 9.1  --  8.7* 8.3*  PHOS 4.1  --  5.7* 5.3*     Liver Function Tests: Recent Labs  Lab 10/02/20 0436 10/05/20 0410 10/07/20 0323  ALBUMIN 1.6* 1.5* <1.5*    No results for input(s): LIPASE, AMYLASE in the last 168 hours. No results for input(s): AMMONIA in the last 168 hours.  CBC: Recent Labs  Lab 10/02/20 0436 10/05/20 0410 10/07/20 0323  WBC 24.4* 16.9* 10.8*  HGB 7.1* 8.0* 6.2*  HCT 23.6* 27.1* 20.7*  MCV 81.7 81.6 80.2  PLT 248 284 239     Cardiac Enzymes: No results for input(s): CKTOTAL, CKMB, CKMBINDEX, TROPONINI in the last 168 hours.  BNP: Invalid input(s): POCBNP  CBG: No results for input(s): GLUCAP in the last 168 hours.  Microbiology: Results for orders placed or performed during the hospital encounter of 07/17/20  Culture, blood (routine x 2)     Status: None   Collection Time: 07/27/20 11:17 AM   Specimen: BLOOD RIGHT ARM   Result Value Ref Range Status   Specimen Description BLOOD RIGHT ARM  Final   Special Requests   Final    BOTTLES DRAWN AEROBIC ONLY Blood Culture results may not be optimal due to an inadequate volume of blood received in culture bottles   Culture   Final    NO GROWTH 5 DAYS Performed at [facility], [place] [address], [place], [place] [address]    Report Status 08/01/2020 FINAL  Final  Culture, blood (routine x 2)     Status: None   Collection Time: 07/27/20 11:23 AM   Specimen: BLOOD RIGHT HAND  Result Value Ref Range Status   Specimen Description BLOOD RIGHT HAND  Final   Special Requests   Final    BOTTLES DRAWN AEROBIC ONLY Blood Culture results may not be optimal due to an inadequate volume of blood received in culture bottles   Culture   Final    NO GROWTH 5 DAYS Performed at [facility], [place] [address] [place], [place] [address]    Report Status 08/01/2020 FINAL  Final  Culture, blood (routine x 2)     Status: None   Collection Time: 08/01/20  2:25 PM   Specimen: BLOOD RIGHT HAND  Result Value Ref Range Status  Specimen Description BLOOD RIGHT HAND  Final   Special Requests   Final    BOTTLES DRAWN AEROBIC AND ANAEROBIC Blood Culture results may not be optimal due to an inadequate volume of blood received in culture bottles   Culture   Final    NO GROWTH 5 DAYS Performed at [facility], [place] [address], [place], [place] [address]    Report Status 08/06/2020 FINAL  Final  Culture, blood (routine x 2)     Status: None   Collection Time: 08/01/20  2:39 PM   Specimen: BLOOD RIGHT ARM  Result Value Ref Range Status   Specimen Description BLOOD RIGHT ARM  Final   Special Requests   Final    BOTTLES DRAWN AEROBIC ONLY Blood Culture results may not be optimal due to an inadequate volume of blood received in culture bottles   Culture   Final    NO GROWTH 5 DAYS Performed at [facility], [place] [address], [place], [place] [address]    Report Status  08/06/2020 FINAL  Final  Culture, Respiratory w Gram Stain     Status: None   Collection Time: 08/01/20  2:55 PM   Specimen: Tracheal Aspirate; Respiratory  Result Value Ref Range Status   Specimen Description TRACHEAL ASPIRATE  Final   Special Requests NONE  Final   Gram Stain   Final    FEW WBC PRESENT, PREDOMINANTLY PMN MODERATE GRAM NEGATIVE RODS Performed at [facility], [place] [address], [place], [place] [address]    Culture ABUNDANT PSEUDOMONAS AERUGINOSA  Final   Report Status 08/04/2020 FINAL  Final   Organism ID, Bacteria PSEUDOMONAS AERUGINOSA  Final      Susceptibility   Pseudomonas aeruginosa - MIC*    CEFTAZIDIME <=1 SENSITIVE Sensitive     CIPROFLOXACIN <=0.25 SENSITIVE Sensitive     GENTAMICIN 8 INTERMEDIATE Intermediate     IMIPENEM >=16 RESISTANT Resistant     PIP/TAZO <=4 SENSITIVE Sensitive     CEFEPIME 2 SENSITIVE Sensitive     * ABUNDANT PSEUDOMONAS AERUGINOSA  Culture, Respiratory w Gram Stain     Status: None   Collection Time: 09/17/20  5:21 PM   Specimen: Tracheal Aspirate; Respiratory  Result Value Ref Range Status   Specimen Description TRACHEAL ASPIRATE  Final   Special Requests NONE  Final   Gram Stain   Final    ABUNDANT WBC PRESENT,BOTH PMN AND MONONUCLEAR MODERATE SQUAMOUS EPITHELIAL CELLS PRESENT MODERATE GRAM POSITIVE COCCI MODERATE GRAM NEGATIVE RODS Performed at [facility], [place] [address], [place], [place] [address]    Culture FEW PSEUDOMONAS AERUGINOSA  Final   Report Status 09/22/2020 FINAL  Final   Organism ID, Bacteria PSEUDOMONAS AERUGINOSA  Final      Susceptibility   Pseudomonas aeruginosa - MIC*    CEFTAZIDIME 2 SENSITIVE Sensitive     CIPROFLOXACIN 1 SENSITIVE Sensitive     GENTAMICIN >=16 RESISTANT Resistant     IMIPENEM >=16 RESISTANT Resistant     PIP/TAZO <=4 SENSITIVE Sensitive     * FEW PSEUDOMONAS AERUGINOSA  Culture, blood (routine x 2)     Status: None   Collection Time: 09/18/20 12:05 PM   Specimen:  BLOOD RIGHT ARM  Result Value Ref Range Status   Specimen Description BLOOD RIGHT ARM  Final   Special Requests   Final    BOTTLES DRAWN AEROBIC AND ANAEROBIC Blood Culture adequate volume   Culture   Final    NO  GROWTH 5 DAYS Performed at [facility], [place] [address] [place], [place] [address]    Report Status 09/23/2020 FINAL  Final  Culture, blood (routine x 2)     Status: None   Collection Time: 09/18/20 12:05 PM   Specimen: BLOOD RIGHT HAND  Result Value Ref Range Status   Specimen Description BLOOD RIGHT HAND  Final   Special Requests   Final    BOTTLES DRAWN AEROBIC AND ANAEROBIC Blood Culture adequate volume   Culture   Final    NO GROWTH 5 DAYS Performed at [facility], [place] [address], [place], [place] [address]    Report Status 09/23/2020 FINAL  Final  Culture, Respiratory w Gram Stain     Status: None   Collection Time: 09/29/20 12:06 PM   Specimen: Tracheal Aspirate; Respiratory  Result Value Ref Range Status   Specimen Description TRACHEAL ASPIRATE  Final   Special Requests NONE  Final   Gram Stain   Final    RARE SQUAMOUS EPITHELIAL CELLS PRESENT ABUNDANT WBC PRESENT,BOTH PMN AND MONONUCLEAR ABUNDANT GRAM NEGATIVE RODS    Culture   Final    FEW PSEUDOMONAS AERUGINOSA MULTI-DRUG RESISTANT ORGANISM CRITICAL RESULT CALLED TO, READ BACK BY AND VERIFIED WITH: [place] RN '@[id number]'$  10/03/20 EB Performed at [facility], [place] [address], [place], [place] [address]    Report Status 10/03/2020 FINAL  Final   Organism ID, Bacteria PSEUDOMONAS AERUGINOSA  Final      Susceptibility   Pseudomonas aeruginosa - MIC*    CEFTAZIDIME 16 INTERMEDIATE Intermediate     CIPROFLOXACIN 0.5 SENSITIVE Sensitive     GENTAMICIN >=16 RESISTANT Resistant     IMIPENEM >=16 RESISTANT Resistant     * FEW PSEUDOMONAS AERUGINOSA  Culture, blood (routine x 2)     Status: Abnormal   Collection Time: 09/29/20  1:07 PM   Specimen: BLOOD RIGHT ARM  Result Value Ref Range Status    Specimen Description BLOOD RIGHT ARM  Final   Special Requests   Final    BOTTLES DRAWN AEROBIC AND ANAEROBIC Blood Culture adequate volume   Culture  Setup Time   Final    GRAM POSITIVE COCCI IN CHAINS AEROBIC BOTTLE ONLY CRITICAL RESULT CALLED TO, READ BACK BY AND VERIFIED WITH: RN S.SNED AT 1233 ON 09/30/2020 BY T.SAAD. Performed at [facility], [place] [address], [place], [place] [address]    Culture VANCOMYCIN RESISTANT ENTEROCOCCUS (A)  Final   Report Status 10/02/2020 FINAL  Final   Organism ID, Bacteria VANCOMYCIN RESISTANT ENTEROCOCCUS  Final      Susceptibility   Vancomycin resistant enterococcus - MIC*    AMPICILLIN <=2 SENSITIVE Sensitive     VANCOMYCIN >=32 RESISTANT Resistant     GENTAMICIN SYNERGY SENSITIVE Sensitive     LINEZOLID 2 SENSITIVE Sensitive     * VANCOMYCIN RESISTANT ENTEROCOCCUS  Blood Culture ID Panel (Reflexed)     Status: Abnormal   Collection Time: 09/29/20  1:07 PM  Result Value Ref Range Status   Enterococcus faecalis DETECTED (A) NOT DETECTED Final    Comment: CRITICAL RESULT CALLED TO, READ BACK BY AND VERIFIED WITH: RN S.SNED AT 1233 ON 09/30/2020 BY T.SAAD.    Enterococcus Faecium NOT DETECTED NOT DETECTED Final   Listeria monocytogenes NOT DETECTED NOT DETECTED Final   Staphylococcus species NOT DETECTED NOT DETECTED Final   Staphylococcus aureus (BCID) NOT DETECTED NOT DETECTED Final   Staphylococcus epidermidis NOT DETECTED NOT DETECTED Final  Staphylococcus lugdunensis NOT DETECTED NOT DETECTED Final   Streptococcus species NOT DETECTED NOT DETECTED Final   Streptococcus agalactiae NOT DETECTED NOT DETECTED Final   Streptococcus pneumoniae NOT DETECTED NOT DETECTED Final   Streptococcus pyogenes NOT DETECTED NOT DETECTED Final   A.calcoaceticus-baumannii NOT DETECTED NOT DETECTED Final   Bacteroides fragilis NOT DETECTED NOT DETECTED Final   Enterobacterales NOT DETECTED NOT DETECTED Final   Enterobacter cloacae complex NOT  DETECTED NOT DETECTED Final   Escherichia coli NOT DETECTED NOT DETECTED Final   Klebsiella aerogenes NOT DETECTED NOT DETECTED Final   Klebsiella oxytoca NOT DETECTED NOT DETECTED Final   Klebsiella pneumoniae NOT DETECTED NOT DETECTED Final   Proteus species NOT DETECTED NOT DETECTED Final   Salmonella species NOT DETECTED NOT DETECTED Final   Serratia marcescens NOT DETECTED NOT DETECTED Final   Haemophilus influenzae NOT DETECTED NOT DETECTED Final   Neisseria meningitidis NOT DETECTED NOT DETECTED Final   Pseudomonas aeruginosa NOT DETECTED NOT DETECTED Final   Stenotrophomonas maltophilia NOT DETECTED NOT DETECTED Final   Candida albicans NOT DETECTED NOT DETECTED Final   Candida auris NOT DETECTED NOT DETECTED Final   Candida glabrata NOT DETECTED NOT DETECTED Final   Candida krusei NOT DETECTED NOT DETECTED Final   Candida parapsilosis NOT DETECTED NOT DETECTED Final   Candida tropicalis NOT DETECTED NOT DETECTED Final   Cryptococcus neoformans/gattii NOT DETECTED NOT DETECTED Final   Vancomycin resistance DETECTED (A) NOT DETECTED Final    Comment: CRITICAL RESULT CALLED TO, READ BACK BY AND VERIFIED WITH: RN S.SNED AT 1233 ON 09/30/2020 BY T.SAAD. Performed at [facility], [place] [address] [place], [place] [address]   Culture, blood (routine x 2)     Status: None   Collection Time: 09/29/20  1:14 PM   Specimen: BLOOD RIGHT ARM  Result Value Ref Range Status   Specimen Description BLOOD RIGHT ARM  Final   Special Requests   Final    BOTTLES DRAWN AEROBIC AND ANAEROBIC Blood Culture adequate volume   Culture   Final    NO GROWTH 5 DAYS Performed at [facility], [place] [address] [place], [place] [address]    Report Status 10/04/2020 FINAL  Final    Coagulation Studies: No results for input(s): LABPROT, INR in the last 72 hours.   Urinalysis: No results for input(s): COLORURINE, LABSPEC, PHURINE, GLUCOSEU, HGBUR, BILIRUBINUR, KETONESUR, PROTEINUR,  UROBILINOGEN, NITRITE, LEUKOCYTESUR in the last 72 hours.  Invalid input(s): APPERANCEUR    Imaging: CT ABDOMEN PELVIS W CONTRAST  Result Date: 10/06/2020 CLINICAL DATA:  Evaluate for abscess. Sacral wound with osteomyelitis. EXAM: CT ABDOMEN AND PELVIS WITH CONTRAST TECHNIQUE: Multidetector CT imaging of the abdomen and pelvis was performed using the standard protocol following bolus administration of intravenous contrast. CONTRAST:  54m OMNIPAQUE IOHEXOL 350 MG/ML SOLN COMPARISON:  CT abdomen and pelvis 09/17/2020. FINDINGS: Lower chest: There is atelectasis in the lung bases. The heart is enlarged, unchanged. Hepatobiliary: No focal liver abnormality is seen. No gallstones, gallbladder wall thickening, or biliary dilatation. Pancreas: Difficult to visualize, grossly within normal limits. Spleen: Normal in size without focal abnormality. Adrenals/Urinary Tract: Adrenal glands are within normal limits. The bilateral kidneys are atrophic with innumerable small hypodense areas, unchanged from prior. The bladder is decompressed and not well evaluated. Stomach/Bowel: Percutaneous gastrostomy tube is in the stomach. Small bowel loops are nondilated. There is diffuse gaseous distention of the colon without definite transition point. Vascular/Lymphatic: No significant vascular findings are present. No enlarged abdominal  or pelvic lymph nodes. Reproductive: Prostate is unremarkable. Other: There is no ascites or free air. There is no focal abdominal wall hernia. Musculoskeletal: There is a stable fluid collection anterior to the tip of the coccyx measuring 3.9 x 2.7 cm, unchanged from the prior examination. Left sacral decubitus also air is again noted extending to the underlying distal sacrum and coccyx. There is no abscess in this region. Underlying sclerosis and small erosions are again noted at this level, unchanged from the prior examination. There are stable severe degenerative changes at L5-S1 with  anterolisthesis and bilateral pars interarticularis defects at L5. No definite acute fractures are seen. IMPRESSION: 1. Sacral decubitus ulcer is again seen. Bony irregularity of the distal sacrum and coccyx appears unchanged compatible with osteomyelitis. 2. Stable fluid collection anterior to the tip of the coccyx. No new soft tissue fluid collections are identified. 3. Diffuse colonic distension may represent ileus. 4. Gastrostomy tube tip in the stomach. Electronically Signed   By: Ronney Asters M.D.   On: 10/06/2020 22:54     Medications:       Assessment/ Plan:  45 y.o. male with a PMHx of ESRD on HD, posterior reversible encephalopathy syndrome, diabetes mellitus type 2, chronic systolic heart failure, anemia of chronic kidney disease, secondary hyperparathyroidism, malignant hypertension, acute respiratory failure, who was admitted to [facility] on 06/29/2020 for ongoing treatment of acute respiratory failure, posterior reversible encephalopathy syndrome, and end-stage renal disease.   1.  ESRD on HD.  Patient seen during dialysis treatment, tolerating well, UF target 1.5kg.   2.  Anemia of chronic kidney disease.  Hemoglobin down to 6.2, receiving blood transfusion during dialysis.   3.  Secondary hyperparathyroidism.  Serum phosphorus at target at 5.3, will monitor.   4.  Acute respiratory failure.  Tracheostomy placed 07/17/2020.  Continues on trach collar.  5.  Hypertension.  Continue on amlodipine, clonidine, hydralazine, labetalol, and losartan.  Blood pressure currently 138/78.     LOS: 0 Prestyn Stanco 10/5/202211:03 AM

## 2020-10-08 ENCOUNTER — Other Ambulatory Visit (HOSPITAL_COMMUNITY): Payer: Medicare Other

## 2020-10-08 LAB — TYPE AND SCREEN
ABO/RH(D): O POS
Antibody Screen: NEGATIVE
Unit division: 0

## 2020-10-08 LAB — BPAM RBC
Blood Product Expiration Date: 202210292359
ISSUE DATE / TIME: 202210050852
Unit Type and Rh: 5100

## 2020-10-08 LAB — HEMOGLOBIN AND HEMATOCRIT, BLOOD
HCT: 24.3 % — ABNORMAL LOW (ref 39.0–52.0)
Hemoglobin: 7.5 g/dL — ABNORMAL LOW (ref 13.0–17.0)

## 2020-10-09 LAB — RENAL FUNCTION PANEL
Albumin: <1.5 g/dL — ABNORMAL LOW (ref 3.5–5.0)
Anion gap: 10 (ref 5–15)
BUN: 51 mg/dL — ABNORMAL HIGH (ref 6–20)
CO2: 26 mmol/L (ref 22–32)
Calcium: 8.6 mg/dL — ABNORMAL LOW (ref 8.9–10.3)
Chloride: 90 mmol/L — ABNORMAL LOW (ref 98–111)
Creatinine, Ser: 2.93 mg/dL — ABNORMAL HIGH (ref 0.61–1.24)
GFR, Estimated: 26 mL/min — ABNORMAL LOW (ref >60)
Glucose, Bld: 102 mg/dL — ABNORMAL HIGH (ref 70–99)
Phosphorus: 4.6 mg/dL (ref 2.5–4.6)
Potassium: 3.8 mmol/L (ref 3.5–5.1)
Sodium: 126 mmol/L — ABNORMAL LOW (ref 135–145)

## 2020-10-09 LAB — CBC
HCT: 25.3 % — ABNORMAL LOW (ref 39.0–52.0)
Hemoglobin: 7.7 g/dL — ABNORMAL LOW (ref 13.0–17.0)
MCH: 24.5 pg — ABNORMAL LOW (ref 26.0–34.0)
MCHC: 30.4 g/dL (ref 30.0–36.0)
MCV: 80.6 fL (ref 80.0–100.0)
Platelets: 265 10*3/uL (ref 150–400)
RBC: 3.14 MIL/uL — ABNORMAL LOW (ref 4.22–5.81)
RDW: 17.8 % — ABNORMAL HIGH (ref 11.5–15.5)
WBC: 8.5 10*3/uL (ref 4.0–10.5)
nRBC: 0 % (ref 0.0–0.2)

## 2020-10-09 NOTE — Progress Notes (Signed)
PROGRESS NOTE    Jeffery Andrews  [id number] DOB: 11-25-1975 DOA: 07/17/2020  Brief Narrative:  Jeffery Andrews is an 45 y.o. male with medical history significant of ESRD on hemodialysis, posterior reversible encephalopathy syndrome, diabetes mellitus type 2, chronic systolic congestive heart failure, hypertension who was initially admitted to [facility] when he presented to the emergency department on 06/22/2020 due to encephalopathy.  He was found to have malignant hypertension and worsening congestive heart failure.  He also had significant electrolyte abnormalities.  Due to his encephalopathy he was intubated.  He also had hyperglycemia without ketoacidosis.  He was treated with insulin drip and dialysis.  He was diagnosed with posterior reversible encephalopathy syndrome.  Due to his complex medical problems he was transferred and admitted to [facility]  After admission here he had respiratory cultures which showed Pseudomonas for which he received treatment with antibiotics.  As soon as antibiotics are discontinued he starts having worsening leukocytosis, fever.  His previous chest imaging showed bilateral lower lobe consolidation.  He received treatment with multiple rounds of antibiotics.  Unfortunately continues to have increased secretions with on and off fever and leukocytosis. 10/02/2020: Urine culture showing VRE.  Assessment & Plan:  Active Problems:   Acute on chronic respiratory failure with hypoxia  Pneumonia with Pseudomonas aeruginosa Systemic inflammatory response syndrome/leukocytosis UTI with VRE Sacrococcygeal pressure ulcer, unstageable with osteomyelitis   End stage renal disease on dialysis  Diabetes mellitus     Chronic diastolic CHF (congestive heart failure) Hypertension Encephalopathy Dysphagia/protein calorie malnutrition   Acute on chronic respiratory failure with hypoxemia: Multifactorial etiology.  He has had  pneumonia and received treatment with multiple rounds of antibiotics.  Respiratory cultures from 09/29/2020 again showing Pseudomonas.  Having worsening leukocytosis along with on and off fevers. On empiric IV meropenem.  Follow-up on the final susceptibilities and adjust antibiotics accordingly.  Also recommend to add linezolid for the urine cultures that are showing VRE.  Please monitor BUN/creatinine closely while on the antibiotics.  Unfortunately continuing to have increased secretions.  Recommend pulmonary toileting.  He unfortunately has dysphagia and also high risk for recurrent aspiration and aspiration pneumonitis despite being on antibiotics.   Pneumonia: Respiratory culture continue to show Pseudomonas aeruginosa.  Antibiotics and plan as mentioned above.  Susceptibilities still pending at this time.  Follow-up on the final culture results and adjust antibiotics accordingly.  As mentioned above, due to his dysphagia and aspiration he is high risk for recurrent aspiration pneumonitis and worsening respiratory failure despite being on antibiotics.   Systemic inflammatory response syndrome/leukocytosis: He has multiple sources including pneumonia, dysphagia with high suspicion for ongoing aspiration, sacrococcygeal pressure ulcer with osteomyelitis.  Antibiotics and plan as mentioned above.  Blood cultures did not show any growth to date.  Respiratory culture showing Pseudomonas aeruginosa.  Also recommend to add linezolid for UTI with VRE.  Please follow CBC, BMP, adjust antibiotic dose accordingly.  Follow-up platelet count especially with the linezolid.  Sacrococcygeal pressure ulcer, unstageable with osteomyelitis: He has received treatment with multiple rounds of antibiotics.  Unfortunately due to his debility, inability to offload he is high risk for further skin breakdown and worsening of the pressure ulcers.  Continue local wound care.  In the meantime, if he is having worsening leukocytosis or  fevers or worsening of the pressure ulcer then we may need to consider consulting surgery to evaluate for debridement.   End-stage renal disease on dialysis: Antibiotics renally dosed.  Nephrology following.  Dialysis per nephrology.   Diabetes mellitus: Continue to monitor Accu-Cheks, medications and management of diabetes per the primary team.  He will need proper glycemic control in order to enable wound healing.   Congestive heart failure: Management per the primary team.   Encephalopathy: Likely toxic/metabolic.  Antibiotics as mentioned above.  Continue supportive management per primary team.   Hypertension: He had uncontrolled hypertension with hypertensive encephalopathy at the acute facility.  Continue medications and management of hypertension per the primary team.   Dysphagia/protein calorie malnutrition: Due to his dysphagia he is at risk for ongoing aspiration and recurrent aspiration pneumonitis and pneumonia with worsening respiratory failure.  Management of protein calorie malnutrition per the primary team.   Unfortunately due to his complex medical problems he is high risk for worsening and decompensation.  Overall poor prognosis.  Recommend to discuss goals of care.  Plan of care discussed with the primary team.   Subjective: He is having on and off fevers with leukocytosis.  Unfortunately remains encephalopathic.  Respiratory cultures again showing Pseudomonas.  Urine cultures showing VRE.  Objective: Vitals: Temperature 0.1, pulse 76, respiratory 18, blood pressure 150/90  Examination: Constitutional: chronically ill-appearing male, opening eyes, encephalopathic Head: Atraumatic Eyes: PERLA  ENMT: external ears and nose appear normal, Lips appears normal, moist oral mucosa  Neck: Has trach in place CVS: S1-S2   Respiratory: Decreased breath sounds lower lobes, rhonchi, no wheezing Abdomen: soft nontender, nondistended, normal bowel sounds, PEG tube in  place Musculoskeletal: Bilateral upper extremity edema, mild lower extremity edema Neuro: Encephalopathic, he is not following commands at this time.  Unable to do a neurologic exam. Psych: Unable to assess at this time Skin: no rashes   Data Reviewed: I have personally reviewed labs and imaging studies  Recent Results (from the past 240 hour(s))  Culture, Respiratory w Gram Stain     Status: None   Collection Time: 09/29/20 12:06 PM   Specimen: Tracheal Aspirate; Respiratory  Result Value Ref Range Status   Specimen Description TRACHEAL ASPIRATE  Final   Special Requests NONE  Final   Gram Stain   Final    RARE SQUAMOUS EPITHELIAL CELLS PRESENT ABUNDANT WBC PRESENT,BOTH PMN AND MONONUCLEAR ABUNDANT GRAM NEGATIVE RODS     Scheduled Meds: Please see MAR    Yaakov Guthrie, MD 10/02/2020, 10:29 PM

## 2020-10-09 NOTE — Progress Notes (Signed)
PROGRESS NOTE    PIO STADTLANDER  [id number] DOB: January 21, 1975 DOA: 07/17/2020  Brief Narrative:  Jeffery Andrews is an 45 y.o. male with medical history significant of ESRD on hemodialysis, posterior reversible encephalopathy syndrome, diabetes mellitus type 2, chronic systolic congestive heart failure, hypertension who was initially admitted to [facility] when he presented to the emergency department on 06/22/2020 due to encephalopathy.  He was found to have malignant hypertension and worsening congestive heart failure.  He also had significant electrolyte abnormalities.  Due to his encephalopathy he was intubated.  He also had hyperglycemia without ketoacidosis.  He was treated with insulin drip and dialysis.  He was diagnosed with posterior reversible encephalopathy syndrome.  Due to his complex medical problems he was transferred and admitted to [facility].  After admission here he had respiratory cultures which showed Pseudomonas for which he received treatment with antibiotics.  As soon as antibiotics are discontinued he starts having worsening leukocytosis, fever.  His previous chest imaging showed bilateral lower lobe consolidation.  He received treatment with multiple rounds of antibiotics.  Unfortunately continues to have increased secretions with on and off fever and leukocytosis. 10/02/2020: Blood culture showing VRE in 1 set.  Assessment & Plan:  Active Problems:   Acute on chronic respiratory failure with hypoxia  Pneumonia with Pseudomonas aeruginosa Systemic inflammatory response syndrome/leukocytosis UTI with VRE Sacrococcygeal pressure ulcer, unstageable with osteomyelitis   End stage renal disease on dialysis  Diabetes mellitus     Chronic diastolic CHF (congestive heart failure) Hypertension Encephalopathy Dysphagia/protein calorie malnutrition   Acute on chronic respiratory failure with hypoxemia: Multifactorial etiology.  He  has had pneumonia and received treatment with multiple rounds of antibiotics.  Respiratory cultures from 09/29/2020 again showing Pseudomonas.  Having worsening leukocytosis along with on and off fevers. On empiric IV meropenem.  Follow-up on the final susceptibilities and adjust antibiotics accordingly.  Also recommend to add linezolid for the blood cultures that are showing VRE in 1 set.  Please monitor BUN/creatinine closely while on the antibiotics.  Unfortunately continuing to have increased secretions.  Recommend pulmonary toileting.  He unfortunately has dysphagia and also high risk for recurrent aspiration and aspiration pneumonitis despite being on antibiotics.   Pneumonia: Respiratory culture continue to show Pseudomonas aeruginosa.  Antibiotics and plan as mentioned above.  Susceptibilities still pending at this time.  Follow-up on the final culture results and adjust antibiotics accordingly.  As mentioned above, due to his dysphagia and aspiration he is high risk for recurrent aspiration pneumonitis and worsening respiratory failure despite being on antibiotics.   Systemic inflammatory response syndrome/leukocytosis: He has multiple sources including pneumonia, dysphagia with high suspicion for ongoing aspiration, sacrococcygeal pressure ulcer with osteomyelitis.  Antibiotics and plan as mentioned above.  Blood cultures showing VRE in 1 set.  Respiratory culture showing Pseudomonas aeruginosa.  Also recommend to add linezolid for VRE.  Please follow CBC, BMP, adjust antibiotic dose accordingly.  Follow-up platelet count especially with the linezolid.  Sacrococcygeal pressure ulcer, unstageable with osteomyelitis: He has received treatment with multiple rounds of antibiotics.  Unfortunately due to his debility, inability to offload he is high risk for further skin breakdown and worsening of the pressure ulcers.  Continue local wound care.  In the meantime, if he is having worsening leukocytosis or  fevers or worsening of the pressure ulcer then we may need to consider consulting surgery to evaluate for debridement.   End-stage renal disease on dialysis: Antibiotics renally dosed.  Nephrology following.  Dialysis per nephrology.   Diabetes mellitus: Continue to monitor Accu-Cheks, medications and management of diabetes per the primary team.  He will need proper glycemic control in order to enable wound healing.   Congestive heart failure: Management per the primary team.   Encephalopathy: Likely toxic/metabolic.  Antibiotics as mentioned above.  Continue supportive management per primary team.   Hypertension: He had uncontrolled hypertension with hypertensive encephalopathy at the acute facility.  Continue medications and management of hypertension per the primary team.   Dysphagia/protein calorie malnutrition: Due to his dysphagia he is at risk for ongoing aspiration and recurrent aspiration pneumonitis and pneumonia with worsening respiratory failure.  Management of protein calorie malnutrition per the primary team.   Unfortunately due to his complex medical problems he is high risk for worsening and decompensation.  Overall poor prognosis.  Recommend to discuss goals of care.  Plan of care discussed with the primary team.   Subjective: He is having on and off fevers with leukocytosis.  Unfortunately remains encephalopathic.  Respiratory cultures again showing Pseudomonas.    Objective: Vitals: Temperature 0.1, pulse 76, respiratory 18, blood pressure 150/90  Examination: Constitutional: chronically ill-appearing male, opening eyes, encephalopathic Head: Atraumatic Eyes: PERLA  ENMT: external ears and nose appear normal, Lips appears normal, moist oral mucosa  Neck: Has trach in place CVS: S1-S2   Respiratory: Decreased breath sounds lower lobes, rhonchi, no wheezing Abdomen: soft nontender, nondistended, normal bowel sounds, PEG tube in place Musculoskeletal: Bilateral upper  extremity edema, mild lower extremity edema Neuro: Encephalopathic, he is not following commands at this time.  Unable to do a neurologic exam. Psych: Unable to assess at this time Skin: no rashes   Data Reviewed: I have personally reviewed labs and imaging studies  Recent Results (from the past 240 hour(s))  Culture, Respiratory w Gram Stain     Status: None   Collection Time: 09/29/20 12:06 PM   Specimen: Tracheal Aspirate; Respiratory  Result Value Ref Range Status   Specimen Description TRACHEAL ASPIRATE  Final   Special Requests NONE  Final   Gram Stain   Final    RARE SQUAMOUS EPITHELIAL CELLS PRESENT ABUNDANT WBC PRESENT,BOTH PMN AND MONONUCLEAR ABUNDANT GRAM NEGATIVE RODS     Scheduled Meds: Please see MAR    Yaakov Guthrie, MD 10/02/2020, 10:29 PM

## 2020-10-09 NOTE — Progress Notes (Signed)
[facility]  ROUNDING NOTE   Subjective:  Patient is due for hemodialysis treatment today. Serum sodium has been a bit low this week. Lying in bed at the moment.  Objective:  Vital signs in last 24 hours:  Temperature 97.1 pulse 64 respirations 18 blood pressure 127/76  Physical Exam: General:  No acute distress  Head:  Normocephalic, atraumatic.    Eyes:  Anicteric  Neck:  Tracheostomy in place  Lungs:   Scattered rhonchi, normal effort  Heart:  S1S2 no rubs  Abdomen:   Soft, nontender, bowel sounds present  Extremities:  No peripheral edema in lower extremities  Neurologic:  Bilateral hand contractures, not following commands  Skin:  No acute skin rash  Access:  Left upper extremity AV fistula, left upper extremity swelling noted    Basic Metabolic Panel: Recent Labs  Lab 10/04/20 0830 10/05/20 0410 10/07/20 0323 10/09/20 0331  NA  --  127* 124* 126*  K 4.6 5.1 3.9 3.8  CL  --  89* 86* 90*  CO2  --  '24 26 26  '$ GLUCOSE  --  178* 151* 102*  BUN  --  70* 58* 51*  CREATININE  --  3.62* 3.28* 2.93*  CALCIUM  --  8.7* 8.3* 8.6*  PHOS  --  5.7* 5.3* 4.6     Liver Function Tests: Recent Labs  Lab 10/05/20 0410 10/07/20 0323 10/09/20 0331  ALBUMIN 1.5* <1.5* <1.5*    No results for input(s): LIPASE, AMYLASE in the last 168 hours. No results for input(s): AMMONIA in the last 168 hours.  CBC: Recent Labs  Lab 10/05/20 0410 10/07/20 0323 10/08/20 0314 10/09/20 0331  WBC 16.9* 10.8*  --  8.5  HGB 8.0* 6.2* 7.5* 7.7*  HCT 27.1* 20.7* 24.3* 25.3*  MCV 81.6 80.2  --  80.6  PLT 284 239  --  265     Cardiac Enzymes: No results for input(s): CKTOTAL, CKMB, CKMBINDEX, TROPONINI in the last 168 hours.  BNP: Invalid input(s): POCBNP  CBG: No results for input(s): GLUCAP in the last 168 hours.  Microbiology: Results for orders placed or performed during the hospital encounter of 07/17/20  Culture, blood (routine x 2)     Status: None    Collection Time: 07/27/20 11:17 AM   Specimen: BLOOD RIGHT ARM  Result Value Ref Range Status   Specimen Description BLOOD RIGHT ARM  Final   Special Requests   Final    BOTTLES DRAWN AEROBIC ONLY Blood Culture results may not be optimal due to an inadequate volume of blood received in culture bottles   Culture   Final    NO GROWTH 5 DAYS Performed at [facility], [place] [address], [place], [place] [address]    Report Status 08/01/2020 FINAL  Final  Culture, blood (routine x 2)     Status: None   Collection Time: 07/27/20 11:23 AM   Specimen: BLOOD RIGHT HAND  Result Value Ref Range Status   Specimen Description BLOOD RIGHT HAND  Final   Special Requests   Final    BOTTLES DRAWN AEROBIC ONLY Blood Culture results may not be optimal due to an inadequate volume of blood received in culture bottles   Culture   Final    NO GROWTH 5 DAYS Performed at [facility], [place] [address], [place], [place] [address]    Report Status 08/01/2020 FINAL  Final  Culture, blood (routine x 2)     Status: None  Collection Time: 08/01/20  2:25 PM   Specimen: BLOOD RIGHT HAND  Result Value Ref Range Status   Specimen Description BLOOD RIGHT HAND  Final   Special Requests   Final    BOTTLES DRAWN AEROBIC AND ANAEROBIC Blood Culture results may not be optimal due to an inadequate volume of blood received in culture bottles   Culture   Final    NO GROWTH 5 DAYS Performed at [facility], [place] [address] [place], [place] [address]    Report Status 08/06/2020 FINAL  Final  Culture, blood (routine x 2)     Status: None   Collection Time: 08/01/20  2:39 PM   Specimen: BLOOD RIGHT ARM  Result Value Ref Range Status   Specimen Description BLOOD RIGHT ARM  Final   Special Requests   Final    BOTTLES DRAWN AEROBIC ONLY Blood Culture results may not be optimal due to an inadequate volume of blood received in culture bottles   Culture   Final    NO GROWTH 5 DAYS Performed at [facility], [place] [address], [place], [place] [address]    Report Status 08/06/2020 FINAL  Final  Culture, Respiratory w Gram Stain     Status: None   Collection Time: 08/01/20  2:55 PM   Specimen: Tracheal Aspirate; Respiratory  Result Value Ref Range Status   Specimen Description TRACHEAL ASPIRATE  Final   Special Requests NONE  Final   Gram Stain   Final    FEW WBC PRESENT, PREDOMINANTLY PMN MODERATE GRAM NEGATIVE RODS Performed at [facility], [place] [address], [place], [place] [address]    Culture ABUNDANT PSEUDOMONAS AERUGINOSA  Final   Report Status 08/04/2020 FINAL  Final   Organism ID, Bacteria PSEUDOMONAS AERUGINOSA  Final      Susceptibility   Pseudomonas aeruginosa - MIC*    CEFTAZIDIME <=1 SENSITIVE Sensitive     CIPROFLOXACIN <=0.25 SENSITIVE Sensitive     GENTAMICIN 8 INTERMEDIATE Intermediate     IMIPENEM >=16 RESISTANT Resistant     PIP/TAZO <=4 SENSITIVE Sensitive     CEFEPIME 2 SENSITIVE Sensitive     * ABUNDANT PSEUDOMONAS AERUGINOSA  Culture, Respiratory w Gram Stain     Status: None   Collection Time: 09/17/20  5:21 PM   Specimen: Tracheal Aspirate; Respiratory  Result Value Ref Range Status   Specimen Description TRACHEAL ASPIRATE  Final   Special Requests NONE  Final   Gram Stain   Final    ABUNDANT WBC PRESENT,BOTH PMN AND MONONUCLEAR MODERATE SQUAMOUS EPITHELIAL CELLS PRESENT MODERATE GRAM POSITIVE COCCI MODERATE GRAM NEGATIVE RODS Performed at [facility], [place] [address] [place], [place] [address]    Culture FEW PSEUDOMONAS AERUGINOSA  Final   Report Status 09/22/2020 FINAL  Final   Organism ID, Bacteria PSEUDOMONAS AERUGINOSA  Final      Susceptibility   Pseudomonas aeruginosa - MIC*    CEFTAZIDIME 2 SENSITIVE Sensitive     CIPROFLOXACIN 1 SENSITIVE Sensitive     GENTAMICIN >=16 RESISTANT Resistant     IMIPENEM >=16 RESISTANT Resistant     PIP/TAZO <=4 SENSITIVE Sensitive     * FEW PSEUDOMONAS AERUGINOSA  Culture, blood (routine x  2)     Status: None   Collection Time: 09/18/20 12:05 PM   Specimen: BLOOD RIGHT ARM  Result Value Ref Range Status   Specimen Description BLOOD RIGHT ARM  Final   Special Requests   Final  BOTTLES DRAWN AEROBIC AND ANAEROBIC Blood Culture adequate volume   Culture   Final    NO GROWTH 5 DAYS Performed at [facility], [place] [address], [place], [place] [address]    Report Status 09/23/2020 FINAL  Final  Culture, blood (routine x 2)     Status: None   Collection Time: 09/18/20 12:05 PM   Specimen: BLOOD RIGHT HAND  Result Value Ref Range Status   Specimen Description BLOOD RIGHT HAND  Final   Special Requests   Final    BOTTLES DRAWN AEROBIC AND ANAEROBIC Blood Culture adequate volume   Culture   Final    NO GROWTH 5 DAYS Performed at [facility], [place] [address], [place], [place] [address]    Report Status 09/23/2020 FINAL  Final  Culture, Respiratory w Gram Stain     Status: None   Collection Time: 09/29/20 12:06 PM   Specimen: Tracheal Aspirate; Respiratory  Result Value Ref Range Status   Specimen Description TRACHEAL ASPIRATE  Final   Special Requests NONE  Final   Gram Stain   Final    RARE SQUAMOUS EPITHELIAL CELLS PRESENT ABUNDANT WBC PRESENT,BOTH PMN AND MONONUCLEAR ABUNDANT GRAM NEGATIVE RODS    Culture   Final    FEW PSEUDOMONAS AERUGINOSA MULTI-DRUG RESISTANT ORGANISM CRITICAL RESULT CALLED TO, READ BACK BY AND VERIFIED WITH: [name] RN '@[id number]'$  10/03/20 EB Performed at [facility], [place] [address], [place], [place] [address]    Report Status 10/03/2020 FINAL  Final   Organism ID, Bacteria PSEUDOMONAS AERUGINOSA  Final      Susceptibility   Pseudomonas aeruginosa - MIC*    CEFTAZIDIME 16 INTERMEDIATE Intermediate     CIPROFLOXACIN 0.5 SENSITIVE Sensitive     GENTAMICIN >=16 RESISTANT Resistant     IMIPENEM >=16 RESISTANT Resistant     * FEW PSEUDOMONAS AERUGINOSA  Culture, blood (routine x 2)     Status: Abnormal   Collection Time:  09/29/20  1:07 PM   Specimen: BLOOD RIGHT ARM  Result Value Ref Range Status   Specimen Description BLOOD RIGHT ARM  Final   Special Requests   Final    BOTTLES DRAWN AEROBIC AND ANAEROBIC Blood Culture adequate volume   Culture  Setup Time   Final    GRAM POSITIVE COCCI IN CHAINS AEROBIC BOTTLE ONLY CRITICAL RESULT CALLED TO, READ BACK BY AND VERIFIED WITH: RN S.SNED AT 1233 ON 09/30/2020 BY T.SAAD. Performed at [facility], [place] [address], [place],  [address]    Culture VANCOMYCIN RESISTANT ENTEROCOCCUS (A)  Final   Report Status 10/02/2020 FINAL  Final   Organism ID, Bacteria VANCOMYCIN RESISTANT ENTEROCOCCUS  Final      Susceptibility   Vancomycin resistant enterococcus - MIC*    AMPICILLIN <=2 SENSITIVE Sensitive     VANCOMYCIN >=32 RESISTANT Resistant     GENTAMICIN SYNERGY SENSITIVE Sensitive     LINEZOLID 2 SENSITIVE Sensitive     * VANCOMYCIN RESISTANT ENTEROCOCCUS  Blood Culture ID Panel (Reflexed)     Status: Abnormal   Collection Time: 09/29/20  1:07 PM  Result Value Ref Range Status   Enterococcus faecalis DETECTED (A) NOT DETECTED Final    Comment: CRITICAL RESULT CALLED TO, READ BACK BY AND VERIFIED WITH: RN S.SNED AT 1233 ON 09/30/2020 BY T.SAAD.    Enterococcus Faecium NOT DETECTED NOT DETECTED Final   Listeria monocytogenes NOT DETECTED NOT DETECTED Final   Staphylococcus species NOT DETECTED NOT DETECTED Final  Staphylococcus aureus (BCID) NOT DETECTED NOT DETECTED Final   Staphylococcus epidermidis NOT DETECTED NOT DETECTED Final   Staphylococcus lugdunensis NOT DETECTED NOT DETECTED Final   Streptococcus species NOT DETECTED NOT DETECTED Final   Streptococcus agalactiae NOT DETECTED NOT DETECTED Final   Streptococcus pneumoniae NOT DETECTED NOT DETECTED Final   Streptococcus pyogenes NOT DETECTED NOT DETECTED Final   A.calcoaceticus-baumannii NOT DETECTED NOT DETECTED Final   Bacteroides fragilis NOT DETECTED NOT DETECTED Final    Enterobacterales NOT DETECTED NOT DETECTED Final   Enterobacter cloacae complex NOT DETECTED NOT DETECTED Final   Escherichia coli NOT DETECTED NOT DETECTED Final   Klebsiella aerogenes NOT DETECTED NOT DETECTED Final   Klebsiella oxytoca NOT DETECTED NOT DETECTED Final   Klebsiella pneumoniae NOT DETECTED NOT DETECTED Final   Proteus species NOT DETECTED NOT DETECTED Final   Salmonella species NOT DETECTED NOT DETECTED Final   Serratia marcescens NOT DETECTED NOT DETECTED Final   Haemophilus influenzae NOT DETECTED NOT DETECTED Final   Neisseria meningitidis NOT DETECTED NOT DETECTED Final   Pseudomonas aeruginosa NOT DETECTED NOT DETECTED Final   Stenotrophomonas maltophilia NOT DETECTED NOT DETECTED Final   Candida albicans NOT DETECTED NOT DETECTED Final   Candida auris NOT DETECTED NOT DETECTED Final   Candida glabrata NOT DETECTED NOT DETECTED Final   Candida krusei NOT DETECTED NOT DETECTED Final   Candida parapsilosis NOT DETECTED NOT DETECTED Final   Candida tropicalis NOT DETECTED NOT DETECTED Final   Cryptococcus neoformans/gattii NOT DETECTED NOT DETECTED Final   Vancomycin resistance DETECTED (A) NOT DETECTED Final    Comment: CRITICAL RESULT CALLED TO, READ BACK BY AND VERIFIED WITH: RN S.SNED AT 1233 ON 09/30/2020 BY T.SAAD. Performed at [facility], [place] [address], [place], [place] [address]   Culture, blood (routine x 2)     Status: None   Collection Time: 09/29/20  1:14 PM   Specimen: BLOOD RIGHT ARM  Result Value Ref Range Status   Specimen Description BLOOD RIGHT ARM  Final   Special Requests   Final    BOTTLES DRAWN AEROBIC AND ANAEROBIC Blood Culture adequate volume   Culture   Final    NO GROWTH 5 DAYS Performed at [facility], [place] [address] [place], [place] [address]    Report Status 10/04/2020 FINAL  Final    Coagulation Studies: No results for input(s): LABPROT, INR in the last 72 hours.   Urinalysis: No results for input(s):  COLORURINE, LABSPEC, PHURINE, GLUCOSEU, HGBUR, BILIRUBINUR, KETONESUR, PROTEINUR, UROBILINOGEN, NITRITE, LEUKOCYTESUR in the last 72 hours.  Invalid input(s): APPERANCEUR    Imaging: DG Abd Portable 1V  Result Date: 10/08/2020 CLINICAL DATA:  Ileus. EXAM: PORTABLE ABDOMEN - 1 VIEW COMPARISON:  CT abdomen pelvis 10/06/2020 FINDINGS: Air filled loops of dilated small bowel and colon, similar to CT 10/06/2020. No free intraperitoneal air. Percutaneous gastrostomy tube is in stable position. IMPRESSION: Persistent ileus, not significantly changed compared to 10/06/2020. Electronically Signed   By: Ileana Roup M.D.   On: 10/08/2020 14:37     Medications:       Assessment/ Plan:  45 y.o. male with a PMHx of ESRD on HD, posterior reversible encephalopathy syndrome, diabetes mellitus type 2, chronic systolic heart failure, anemia of chronic kidney disease, secondary hyperparathyroidism, malignant hypertension, acute respiratory failure, who was admitted to [facility] on 06/29/2020 for ongoing treatment of acute respiratory failure, posterior reversible encephalopathy syndrome, and end-stage renal disease.   1.  ESRD on HD.  Patient  due for hemodialysis treatment today.  Orders have been prepared.  2.  Anemia of chronic kidney disease.  Hemoglobin up to 7.7 posttransfusion.  Continue to monitor CBC.  3.  Secondary hyperparathyroidism.  Phosphorus down to 4.6 and acceptable.  4.  Acute respiratory failure.  Tracheostomy placed 07/17/2020.  Continues on trach collar.  5.  Hypertension.  Continue on amlodipine, clonidine, hydralazine, labetalol, and losartan.  Blood pressure has been better this week and currently blood pressure 127/76.    LOS: 0 Jeffery Andrews 10/7/20228:12 AM

## 2020-10-09 NOTE — Progress Notes (Signed)
PROGRESS NOTE    Jeffery Andrews  [id number] DOB: 20-Sep-1975 DOA: 07/17/2020  Brief Narrative:  Jeffery Andrews is an 45 y.o. male with medical history significant of ESRD on hemodialysis, posterior reversible encephalopathy syndrome, diabetes mellitus type 2, chronic systolic congestive heart failure, hypertension who was initially admitted to [facility] when he presented to the emergency department on 06/22/2020 due to encephalopathy.  He was found to have malignant hypertension and worsening congestive heart failure.  He also had significant electrolyte abnormalities.  Due to his encephalopathy he was intubated.  He also had hyperglycemia without ketoacidosis.  He was treated with insulin drip and dialysis.  He was diagnosed with posterior reversible encephalopathy syndrome.  Due to his complex medical problems he was transferred and admitted to [facility].  After admission here he had respiratory cultures which showed Pseudomonas for which he received treatment with antibiotics.  As soon as antibiotics are discontinued he starts having worsening leukocytosis, fever.  His previous chest imaging showed bilateral lower lobe consolidation.  He received treatment with multiple rounds of antibiotics.  Unfortunately continues to have increased secretions with on and off fever and leukocytosis. Blood cultures from 09/29/2020 showed VRE in 1 set.  He had tracheal aspirate cultures from 09/29/2020 which again showed few Pseudomonas aeruginosa, multidrug-resistant.  CT of the abdomen and pelvis from 10/06/2020 again showing sacral decubitus ulcer with bony irregularity of the distal sacrum and coccyx, unchanged compatible with osteomyelitis with stable fluid collection anterior to the tip of the coccyx.  He was also noted to have diffuse colonic distention likely ileus.  Assessment & Plan:  Active Problems:   Acute on chronic respiratory failure with hypoxia   Pneumonia with Pseudomonas aeruginosa Sepsis/leukocytosis Sacrococcygeal pressure ulcer, unstageable with osteomyelitis Ileus   End stage renal disease on dialysis  Diabetes mellitus     Chronic diastolic CHF (congestive heart failure) Hypertension Encephalopathy Dysphagia/protein calorie malnutrition   Acute on chronic respiratory failure with hypoxemia: Multifactorial etiology.  He has had pneumonia and received treatment with multiple rounds of antibiotics.  Respiratory cultures from 09/29/2020 again showed Pseudomonas.  As soon as antibiotics are stopped he starts having worsening leukocytosis along with on and off fevers.  He has been on treatment with empiric IV meropenem.  Based on the susceptibilities switched to ciprofloxacin. Please monitor CBC, BUN/creatinine closely while on the antibiotics.  Recommend to continue pulmonary toileting.  He unfortunately has dysphagia and also high risk for recurrent aspiration and aspiration pneumonitis despite being on antibiotics.   Pneumonia: Respiratory culture continue to show Pseudomonas aeruginosa.  Antibiotics and plan as mentioned above.  As mentioned above, due to his dysphagia and aspiration he is high risk for recurrent aspiration pneumonitis and worsening respiratory failure despite being on antibiotics.   Sepsis/leukocytosis: He has multiple sources including pneumonia, dysphagia with high suspicion for ongoing aspiration, sacrococcygeal pressure ulcer with osteomyelitis.  Blood cultures from 09/29/2020 showed VRE in 1 set.  Will plan to treat with antibiotics tentatively until 10/20/2020.  Order repeat blood cultures.  Please follow CBC, BMP, adjust antibiotic dose accordingly.  Follow-up platelet count especially with the linezolid.  Sacrococcygeal pressure ulcer, unstageable with osteomyelitis: He has received treatment with multiple rounds of antibiotics.  Currently on antibiotics as mentioned above.  Unfortunately due to his debility,  inability to offload he is high risk for further skin breakdown and worsening of the pressure ulcers.  Recent CT of the abdomen and pelvis continuing to show findings concerning  for osteomyelitis.  Continue local wound care.  In the meantime, if he is having worsening of the pressure ulcer then we may need to consider consulting surgery to evaluate for debridement.   End-stage renal disease on dialysis: Antibiotics renally dosed.  Nephrology following.  Dialysis per nephrology.   Diabetes mellitus: Continue to monitor Accu-Cheks, medications and management of diabetes per the primary team.  He will need proper glycemic control in order to enable wound healing.   Congestive heart failure: Management per the primary team.   Encephalopathy: Likely toxic/metabolic.  Antibiotics as mentioned above.  Continue supportive management per primary team.   Hypertension: He had uncontrolled hypertension with hypertensive encephalopathy at the acute facility.  Continue medications and management of hypertension per the primary team.  Ileus: Patient noted to have ileus on the imaging.  Because of the ileus he is high risk for aspiration and worsening aspiration pneumonia.  Further management per the primary team.   Dysphagia/protein calorie malnutrition: Due to his dysphagia he is at risk for ongoing aspiration and recurrent aspiration pneumonitis and pneumonia with worsening respiratory failure.  Management of protein calorie malnutrition per the primary team.   Unfortunately due to his complex medical problems he is high risk for worsening and decompensation.  Overall poor prognosis.  Recommend to discuss goals of care.  Plan of care discussed with the primary team.   Subjective: Continues to remain encephalopathic.  Respiratory cultures again showing Pseudomonas.    Objective: Vitals: Temperature 97.3, pulse 70, respiratory rate 14, blood pressure 156/59, pulse oximetry  100%  Examination: Constitutional: chronically ill-appearing male, opening eyes, encephalopathic Head: Atraumatic Eyes: PERLA  ENMT: external ears and nose appear normal, Lips appears normal, moist oral mucosa  Neck: Has trach in place CVS: S1-S2   Respiratory: Decreased breath sounds lower lobes, rhonchi, no wheezing Abdomen: soft nontender, nondistended, normal bowel sounds, PEG tube in place Musculoskeletal: Bilateral upper extremity edema, mild lower extremity edema Neuro: Encephalopathic, he is not following commands at this time.  Unable to do a neurologic exam. Psych: Unable to assess at this time Skin: no rashes   Data Reviewed: I have personally reviewed labs and imaging studies  Results for orders placed or performed during the hospital encounter of 07/17/20  Culture, blood (routine x 2)     Status: None   Collection Time: 07/27/20 11:17 AM   Specimen: BLOOD RIGHT ARM  Result Value Ref Range Status   Specimen Description BLOOD RIGHT ARM  Final   Special Requests   Final    BOTTLES DRAWN AEROBIC ONLY Blood Culture results may not be optimal due to an inadequate volume of blood received in culture bottles   Culture   Final    NO GROWTH 5 DAYS Performed at [facility], [place] [address], [place], [place] [address]    Report Status 08/01/2020 FINAL  Final  Culture, blood (routine x 2)     Status: None   Collection Time: 07/27/20 11:23 AM   Specimen: BLOOD RIGHT HAND  Result Value Ref Range Status   Specimen Description BLOOD RIGHT HAND  Final   Special Requests   Final    BOTTLES DRAWN AEROBIC ONLY Blood Culture results may not be optimal due to an inadequate volume of blood received in culture bottles   Culture   Final    NO GROWTH 5 DAYS Performed at [facility], [place] [address] [place], [place] [address]    Report Status 08/01/2020 FINAL  Final  Culture, blood (routine x 2)     Status: None   Collection Time: 08/01/20  2:25 PM   Specimen: BLOOD RIGHT  HAND  Result Value Ref Range Status   Specimen Description BLOOD RIGHT HAND  Final   Special Requests   Final    BOTTLES DRAWN AEROBIC AND ANAEROBIC Blood Culture results may not be optimal due to an inadequate volume of blood received in culture bottles   Culture   Final    NO GROWTH 5 DAYS Performed at [facility], [place] [address], [place], [place] [address]    Report Status 08/06/2020 FINAL  Final  Culture, blood (routine x 2)     Status: None   Collection Time: 08/01/20  2:39 PM   Specimen: BLOOD RIGHT ARM  Result Value Ref Range Status   Specimen Description BLOOD RIGHT ARM  Final   Special Requests   Final    BOTTLES DRAWN AEROBIC ONLY Blood Culture results may not be optimal due to an inadequate volume of blood received in culture bottles   Culture   Final    NO GROWTH 5 DAYS Performed at [facility], [place] [address] [place], [place] [address]    Report Status 08/06/2020 FINAL  Final  Culture, Respiratory w Gram Stain     Status: None   Collection Time: 08/01/20  2:55 PM   Specimen: Tracheal Aspirate; Respiratory  Result Value Ref Range Status   Specimen Description TRACHEAL ASPIRATE  Final   Special Requests NONE  Final   Gram Stain   Final    FEW WBC PRESENT, PREDOMINANTLY PMN MODERATE GRAM NEGATIVE RODS Performed at [facility], [place] [address], [place], [place] [address]    Culture ABUNDANT PSEUDOMONAS AERUGINOSA  Final   Report Status 08/04/2020 FINAL  Final   Organism ID, Bacteria PSEUDOMONAS AERUGINOSA  Final      Susceptibility   Pseudomonas aeruginosa - MIC*    CEFTAZIDIME <=1 SENSITIVE Sensitive     CIPROFLOXACIN <=0.25 SENSITIVE Sensitive     GENTAMICIN 8 INTERMEDIATE Intermediate     IMIPENEM >=16 RESISTANT Resistant     PIP/TAZO <=4 SENSITIVE Sensitive     CEFEPIME 2 SENSITIVE Sensitive     * ABUNDANT PSEUDOMONAS AERUGINOSA  Culture, Respiratory w Gram Stain     Status: None   Collection Time: 09/17/20  5:21 PM   Specimen: Tracheal  Aspirate; Respiratory  Result Value Ref Range Status   Specimen Description TRACHEAL ASPIRATE  Final   Special Requests NONE  Final   Gram Stain   Final    ABUNDANT WBC PRESENT,BOTH PMN AND MONONUCLEAR MODERATE SQUAMOUS EPITHELIAL CELLS PRESENT MODERATE GRAM POSITIVE COCCI MODERATE GRAM NEGATIVE RODS Performed at [facility], [place] [address] [place], [place] [address]    Culture FEW PSEUDOMONAS AERUGINOSA  Final   Report Status 09/22/2020 FINAL  Final   Organism ID, Bacteria PSEUDOMONAS AERUGINOSA  Final      Susceptibility   Pseudomonas aeruginosa - MIC*    CEFTAZIDIME 2 SENSITIVE Sensitive     CIPROFLOXACIN 1 SENSITIVE Sensitive     GENTAMICIN >=16 RESISTANT Resistant     IMIPENEM >=16 RESISTANT Resistant     PIP/TAZO <=4 SENSITIVE Sensitive     * FEW PSEUDOMONAS AERUGINOSA  Culture, blood (routine x 2)     Status: None   Collection Time: 09/18/20 12:05 PM   Specimen: BLOOD RIGHT ARM  Result Value Ref Range Status   Specimen Description BLOOD  RIGHT ARM  Final   Special Requests   Final    BOTTLES DRAWN AEROBIC AND ANAEROBIC Blood Culture adequate volume   Culture   Final    NO GROWTH 5 DAYS Performed at [facility], [address], [place], [place] [address]    Report Status 09/23/2020 FINAL  Final  Culture, blood (routine x 2)     Status: None   Collection Time: 09/18/20 12:05 PM   Specimen: BLOOD RIGHT HAND  Result Value Ref Range Status   Specimen Description BLOOD RIGHT HAND  Final   Special Requests   Final    BOTTLES DRAWN AEROBIC AND ANAEROBIC Blood Culture adequate volume   Culture   Final    NO GROWTH 5 DAYS Performed at [facility], [place] [address], [place], [place] [address]    Report Status 09/23/2020 FINAL  Final  Culture, Respiratory w Gram Stain     Status: None   Collection Time: 09/29/20 12:06 PM   Specimen: Tracheal Aspirate; Respiratory  Result Value Ref Range Status   Specimen Description TRACHEAL ASPIRATE  Final   Special  Requests NONE  Final   Gram Stain   Final    RARE SQUAMOUS EPITHELIAL CELLS PRESENT ABUNDANT WBC PRESENT,BOTH PMN AND MONONUCLEAR ABUNDANT GRAM NEGATIVE RODS    Culture   Final    FEW PSEUDOMONAS AERUGINOSA MULTI-DRUG RESISTANT ORGANISM CRITICAL RESULT CALLED TO, READ BACK BY AND VERIFIED WITH: [place] RN '@[id number]'$  10/03/20 EB Performed at [facility], [place] [address], [place], [place] [address]    Report Status 10/03/2020 FINAL  Final   Organism ID, Bacteria PSEUDOMONAS AERUGINOSA  Final      Susceptibility   Pseudomonas aeruginosa - MIC*    CEFTAZIDIME 16 INTERMEDIATE Intermediate     CIPROFLOXACIN 0.5 SENSITIVE Sensitive     GENTAMICIN >=16 RESISTANT Resistant     IMIPENEM >=16 RESISTANT Resistant     * FEW PSEUDOMONAS AERUGINOSA  Culture, blood (routine x 2)     Status: Abnormal   Collection Time: 09/29/20  1:07 PM   Specimen: BLOOD RIGHT ARM  Result Value Ref Range Status   Specimen Description BLOOD RIGHT ARM  Final   Special Requests   Final    BOTTLES DRAWN AEROBIC AND ANAEROBIC Blood Culture adequate volume   Culture  Setup Time   Final    GRAM POSITIVE COCCI IN CHAINS AEROBIC BOTTLE ONLY CRITICAL RESULT CALLED TO, READ BACK BY AND VERIFIED WITH: RN S.SNED AT 1233 ON 09/30/2020 BY T.SAAD. Performed at [facility], [place] [address] [place], [place] [address]    Culture VANCOMYCIN RESISTANT ENTEROCOCCUS (A)  Final   Report Status 10/02/2020 FINAL  Final   Organism ID, Bacteria VANCOMYCIN RESISTANT ENTEROCOCCUS  Final      Susceptibility   Vancomycin resistant enterococcus - MIC*    AMPICILLIN <=2 SENSITIVE Sensitive     VANCOMYCIN >=32 RESISTANT Resistant     GENTAMICIN SYNERGY SENSITIVE Sensitive     LINEZOLID 2 SENSITIVE Sensitive     * VANCOMYCIN RESISTANT ENTEROCOCCUS  Blood Culture ID Panel (Reflexed)     Status: Abnormal   Collection Time: 09/29/20  1:07 PM  Result Value Ref Range Status   Enterococcus faecalis DETECTED (A) NOT DETECTED Final     Comment: CRITICAL RESULT CALLED TO, READ BACK BY AND VERIFIED WITH: RN S.SNED AT 1233 ON 09/30/2020 BY T.SAAD.    Enterococcus Faecium NOT DETECTED NOT DETECTED Final   Listeria monocytogenes  NOT DETECTED NOT DETECTED Final   Staphylococcus species NOT DETECTED NOT DETECTED Final   Staphylococcus aureus (BCID) NOT DETECTED NOT DETECTED Final   Staphylococcus epidermidis NOT DETECTED NOT DETECTED Final   Staphylococcus lugdunensis NOT DETECTED NOT DETECTED Final   Streptococcus species NOT DETECTED NOT DETECTED Final   Streptococcus agalactiae NOT DETECTED NOT DETECTED Final   Streptococcus pneumoniae NOT DETECTED NOT DETECTED Final   Streptococcus pyogenes NOT DETECTED NOT DETECTED Final   A.calcoaceticus-baumannii NOT DETECTED NOT DETECTED Final   Bacteroides fragilis NOT DETECTED NOT DETECTED Final   Enterobacterales NOT DETECTED NOT DETECTED Final   Enterobacter cloacae complex NOT DETECTED NOT DETECTED Final   Escherichia coli NOT DETECTED NOT DETECTED Final   Klebsiella aerogenes NOT DETECTED NOT DETECTED Final   Klebsiella oxytoca NOT DETECTED NOT DETECTED Final   Klebsiella pneumoniae NOT DETECTED NOT DETECTED Final   Proteus species NOT DETECTED NOT DETECTED Final   Salmonella species NOT DETECTED NOT DETECTED Final   Serratia marcescens NOT DETECTED NOT DETECTED Final   Haemophilus influenzae NOT DETECTED NOT DETECTED Final   Neisseria meningitidis NOT DETECTED NOT DETECTED Final   Pseudomonas aeruginosa NOT DETECTED NOT DETECTED Final   Stenotrophomonas maltophilia NOT DETECTED NOT DETECTED Final   Candida albicans NOT DETECTED NOT DETECTED Final   Candida auris NOT DETECTED NOT DETECTED Final   Candida glabrata NOT DETECTED NOT DETECTED Final   Candida krusei NOT DETECTED NOT DETECTED Final   Candida parapsilosis NOT DETECTED NOT DETECTED Final   Candida tropicalis NOT DETECTED NOT DETECTED Final   Cryptococcus neoformans/gattii NOT DETECTED NOT DETECTED Final    Vancomycin resistance DETECTED (A) NOT DETECTED Final    Comment: CRITICAL RESULT CALLED TO, READ BACK BY AND VERIFIED WITH: RN S.SNED AT 1233 ON 09/30/2020 BY T.SAAD. Performed at [facility], [place] [address], [place], [place] [address]   Culture, blood (routine x 2)     Status: None   Collection Time: 09/29/20  1:14 PM   Specimen: BLOOD RIGHT ARM  Result Value Ref Range Status   Specimen Description BLOOD RIGHT ARM  Final   Special Requests   Final    BOTTLES DRAWN AEROBIC AND ANAEROBIC Blood Culture adequate volume   Culture   Final    NO GROWTH 5 DAYS Performed at [facility], [place] [address], [place], [place] [address]    Report Status 10/04/2020 FINAL  Final     Scheduled Meds: Please see MAR    Yaakov Guthrie, MD 10/09/20 2:03 PM

## 2020-10-10 LAB — CBC
HCT: 24.6 % — ABNORMAL LOW (ref 39.0–52.0)
Hemoglobin: 7.3 g/dL — ABNORMAL LOW (ref 13.0–17.0)
MCH: 24.5 pg — ABNORMAL LOW (ref 26.0–34.0)
MCHC: 29.7 g/dL — ABNORMAL LOW (ref 30.0–36.0)
MCV: 82.6 fL (ref 80.0–100.0)
Platelets: 275 10*3/uL (ref 150–400)
RBC: 2.98 MIL/uL — ABNORMAL LOW (ref 4.22–5.81)
RDW: 18.1 % — ABNORMAL HIGH (ref 11.5–15.5)
WBC: 7.9 10*3/uL (ref 4.0–10.5)
nRBC: 0 % (ref 0.0–0.2)

## 2020-10-11 ENCOUNTER — Other Ambulatory Visit (HOSPITAL_COMMUNITY): Payer: Medicare Other

## 2020-10-11 LAB — CBC
HCT: 25 % — ABNORMAL LOW (ref 39.0–52.0)
Hemoglobin: 7.5 g/dL — ABNORMAL LOW (ref 13.0–17.0)
MCH: 24.4 pg — ABNORMAL LOW (ref 26.0–34.0)
MCHC: 30 g/dL (ref 30.0–36.0)
MCV: 81.4 fL (ref 80.0–100.0)
Platelets: 262 10*3/uL (ref 150–400)
RBC: 3.07 MIL/uL — ABNORMAL LOW (ref 4.22–5.81)
RDW: 17.9 % — ABNORMAL HIGH (ref 11.5–15.5)
WBC: 7.8 10*3/uL (ref 4.0–10.5)
nRBC: 0 % (ref 0.0–0.2)

## 2020-10-11 LAB — MAGNESIUM: Magnesium: 2.4 mg/dL (ref 1.7–2.4)

## 2020-10-11 LAB — BASIC METABOLIC PANEL
Anion gap: 11 (ref 5–15)
BUN: 50 mg/dL — ABNORMAL HIGH (ref 6–20)
CO2: 25 mmol/L (ref 22–32)
Calcium: 8.5 mg/dL — ABNORMAL LOW (ref 8.9–10.3)
Chloride: 90 mmol/L — ABNORMAL LOW (ref 98–111)
Creatinine, Ser: 3.03 mg/dL — ABNORMAL HIGH (ref 0.61–1.24)
GFR, Estimated: 25 mL/min — ABNORMAL LOW (ref >60)
Glucose, Bld: 89 mg/dL (ref 70–99)
Potassium: 4.2 mmol/L (ref 3.5–5.1)
Sodium: 126 mmol/L — ABNORMAL LOW (ref 135–145)

## 2020-10-12 ENCOUNTER — Other Ambulatory Visit (HOSPITAL_COMMUNITY): Payer: Medicare Other

## 2020-10-12 LAB — CBC
HCT: 29.4 % — ABNORMAL LOW (ref 39.0–52.0)
Hemoglobin: 8.9 g/dL — ABNORMAL LOW (ref 13.0–17.0)
MCH: 24.3 pg — ABNORMAL LOW (ref 26.0–34.0)
MCHC: 30.3 g/dL (ref 30.0–36.0)
MCV: 80.3 fL (ref 80.0–100.0)
Platelets: 304 10*3/uL (ref 150–400)
RBC: 3.66 MIL/uL — ABNORMAL LOW (ref 4.22–5.81)
RDW: 17.6 % — ABNORMAL HIGH (ref 11.5–15.5)
WBC: 6.3 10*3/uL (ref 4.0–10.5)
nRBC: 0 % (ref 0.0–0.2)

## 2020-10-12 LAB — RENAL FUNCTION PANEL
Albumin: <1.5 g/dL — ABNORMAL LOW (ref 3.5–5.0)
Anion gap: 12 (ref 5–15)
BUN: 57 mg/dL — ABNORMAL HIGH (ref 6–20)
CO2: 24 mmol/L (ref 22–32)
Calcium: 8.7 mg/dL — ABNORMAL LOW (ref 8.9–10.3)
Chloride: 88 mmol/L — ABNORMAL LOW (ref 98–111)
Creatinine, Ser: 3.3 mg/dL — ABNORMAL HIGH (ref 0.61–1.24)
GFR, Estimated: 22 mL/min — ABNORMAL LOW (ref >60)
Glucose, Bld: 69 mg/dL — ABNORMAL LOW (ref 70–99)
Phosphorus: 6.1 mg/dL — ABNORMAL HIGH (ref 2.5–4.6)
Potassium: 4.6 mmol/L (ref 3.5–5.1)
Sodium: 124 mmol/L — ABNORMAL LOW (ref 135–145)

## 2020-10-12 NOTE — Progress Notes (Signed)
[facility]  ROUNDING NOTE   Subjective:  Patient sitting up in chair at the moment. Still no significant change in his overall neurologic status. Patient is due for hemodialysis treatment today.  Objective:  Vital signs in last 24 hours:  Temperature 97.1 pulse 58 respiration 16 blood pressure 150/92  Physical Exam: General:  No acute distress  Head:  Normocephalic, atraumatic.    Eyes:  Anicteric  Neck:  Tracheostomy in place  Lungs:   Scattered rhonchi, normal effort  Heart:  S1S2 no rubs  Abdomen:   Soft, nontender, bowel sounds present  Extremities:  No peripheral edema in lower extremities  Neurologic:  Bilateral hand contractures, not following commands  Skin:  No acute skin rash  Access:  Left upper extremity AV fistula, left upper extremity swelling noted    Basic Metabolic Panel: Recent Labs  Lab 10/07/20 0323 10/09/20 0331 10/11/20 1106 10/12/20 0609  NA 124* 126* 126* 124*  K 3.9 3.8 4.2 4.6  CL 86* 90* 90* 88*  CO2 '26 26 25 24  '$ GLUCOSE 151* 102* 89 69*  BUN 58* 51* 50* 57*  CREATININE 3.28* 2.93* 3.03* 3.30*  CALCIUM 8.3* 8.6* 8.5* 8.7*  MG  --   --  2.4  --   PHOS 5.3* 4.6  --  6.1*     Liver Function Tests: Recent Labs  Lab 10/07/20 0323 10/09/20 0331 10/12/20 0609  ALBUMIN <1.5* <1.5* <1.5*    No results for input(s): LIPASE, AMYLASE in the last 168 hours. No results for input(s): AMMONIA in the last 168 hours.  CBC: Recent Labs  Lab 10/07/20 0323 10/08/20 0314 10/09/20 0331 10/10/20 0342 10/11/20 1106 10/12/20 0609  WBC 10.8*  --  8.5 7.9 7.8 6.3  HGB 6.2* 7.5* 7.7* 7.3* 7.5* 8.9*  HCT 20.7* 24.3* 25.3* 24.6* 25.0* 29.4*  MCV 80.2  --  80.6 82.6 81.4 80.3  PLT 239  --  265 275 262 304     Cardiac Enzymes: No results for input(s): CKTOTAL, CKMB, CKMBINDEX, TROPONINI in the last 168 hours.  BNP: Invalid input(s): POCBNP  CBG: No results for input(s): GLUCAP in the last 168 hours.  Microbiology: Results  for orders placed or performed during the hospital encounter of 07/17/20  Culture, blood (routine x 2)     Status: None   Collection Time: 07/27/20 11:17 AM   Specimen: BLOOD RIGHT ARM  Result Value Ref Range Status   Specimen Description BLOOD RIGHT ARM  Final   Special Requests   Final    BOTTLES DRAWN AEROBIC ONLY Blood Culture results may not be optimal due to an inadequate volume of blood received in culture bottles   Culture   Final    NO GROWTH 5 DAYS Performed at [facility], [place] [address], [place], [place] [address]    Report Status 08/01/2020 FINAL  Final  Culture, blood (routine x 2)     Status: None   Collection Time: 07/27/20 11:23 AM   Specimen: BLOOD RIGHT HAND  Result Value Ref Range Status   Specimen Description BLOOD RIGHT HAND  Final   Special Requests   Final    BOTTLES DRAWN AEROBIC ONLY Blood Culture results may not be optimal due to an inadequate volume of blood received in culture bottles   Culture   Final    NO GROWTH 5 DAYS Performed at [facility], [place] [address] [place], [place] [address]    Report Status 08/01/2020 FINAL  Final  Culture, blood (routine x 2)     Status: None   Collection Time: 08/01/20  2:25 PM   Specimen: BLOOD RIGHT HAND  Result Value Ref Range Status   Specimen Description BLOOD RIGHT HAND  Final   Special Requests   Final    BOTTLES DRAWN AEROBIC AND ANAEROBIC Blood Culture results may not be optimal due to an inadequate volume of blood received in culture bottles   Culture   Final    NO GROWTH 5 DAYS Performed at [facility], [place] [address], [place], [place] [address]    Report Status 08/06/2020 FINAL  Final  Culture, blood (routine x 2)     Status: None   Collection Time: 08/01/20  2:39 PM   Specimen: BLOOD RIGHT ARM  Result Value Ref Range Status   Specimen Description BLOOD RIGHT ARM  Final   Special Requests   Final    BOTTLES DRAWN AEROBIC ONLY Blood Culture results may not be optimal due to an  inadequate volume of blood received in culture bottles   Culture   Final    NO GROWTH 5 DAYS Performed at [facility], [place] [address], [place], [place] [address]    Report Status 08/06/2020 FINAL  Final  Culture, Respiratory w Gram Stain     Status: None   Collection Time: 08/01/20  2:55 PM   Specimen: Tracheal Aspirate; Respiratory  Result Value Ref Range Status   Specimen Description TRACHEAL ASPIRATE  Final   Special Requests NONE  Final   Gram Stain   Final    FEW WBC PRESENT, PREDOMINANTLY PMN MODERATE GRAM NEGATIVE RODS Performed at [facility], [place] [address] [place], [place] [address]    Culture ABUNDANT PSEUDOMONAS AERUGINOSA  Final   Report Status 08/04/2020 FINAL  Final   Organism ID, Bacteria PSEUDOMONAS AERUGINOSA  Final      Susceptibility   Pseudomonas aeruginosa - MIC*    CEFTAZIDIME <=1 SENSITIVE Sensitive     CIPROFLOXACIN <=0.25 SENSITIVE Sensitive     GENTAMICIN 8 INTERMEDIATE Intermediate     IMIPENEM >=16 RESISTANT Resistant     PIP/TAZO <=4 SENSITIVE Sensitive     CEFEPIME 2 SENSITIVE Sensitive     * ABUNDANT PSEUDOMONAS AERUGINOSA  Culture, Respiratory w Gram Stain     Status: None   Collection Time: 09/17/20  5:21 PM   Specimen: Tracheal Aspirate; Respiratory  Result Value Ref Range Status   Specimen Description TRACHEAL ASPIRATE  Final   Special Requests NONE  Final   Gram Stain   Final    ABUNDANT WBC PRESENT,BOTH PMN AND MONONUCLEAR MODERATE SQUAMOUS EPITHELIAL CELLS PRESENT MODERATE GRAM POSITIVE COCCI MODERATE GRAM NEGATIVE RODS Performed at [facility], [place] [address], [place], [place] [address]    Culture FEW PSEUDOMONAS AERUGINOSA  Final   Report Status 09/22/2020 FINAL  Final   Organism ID, Bacteria PSEUDOMONAS AERUGINOSA  Final      Susceptibility   Pseudomonas aeruginosa - MIC*    CEFTAZIDIME 2 SENSITIVE Sensitive     CIPROFLOXACIN 1 SENSITIVE Sensitive     GENTAMICIN >=16 RESISTANT Resistant     IMIPENEM >=16  RESISTANT Resistant     PIP/TAZO <=4 SENSITIVE Sensitive     * FEW PSEUDOMONAS AERUGINOSA  Culture, blood (routine x 2)     Status: None   Collection Time: 09/18/20 12:05 PM   Specimen: BLOOD RIGHT ARM  Result Value Ref Range Status   Specimen Description BLOOD  RIGHT ARM  Final   Special Requests   Final    BOTTLES DRAWN AEROBIC AND ANAEROBIC Blood Culture adequate volume   Culture   Final    NO GROWTH 5 DAYS Performed at [facility], [address], [place], [place] [address]    Report Status 09/23/2020 FINAL  Final  Culture, blood (routine x 2)     Status: None   Collection Time: 09/18/20 12:05 PM   Specimen: BLOOD RIGHT HAND  Result Value Ref Range Status   Specimen Description BLOOD RIGHT HAND  Final   Special Requests   Final    BOTTLES DRAWN AEROBIC AND ANAEROBIC Blood Culture adequate volume   Culture   Final    NO GROWTH 5 DAYS Performed at [facility], [place] [address] [place], [place] [address]    Report Status 09/23/2020 FINAL  Final  Culture, Respiratory w Gram Stain     Status: None   Collection Time: 09/29/20 12:06 PM   Specimen: Tracheal Aspirate; Respiratory  Result Value Ref Range Status   Specimen Description TRACHEAL ASPIRATE  Final   Special Requests NONE  Final   Gram Stain   Final    RARE SQUAMOUS EPITHELIAL CELLS PRESENT ABUNDANT WBC PRESENT,BOTH PMN AND MONONUCLEAR ABUNDANT GRAM NEGATIVE RODS    Culture   Final    FEW PSEUDOMONAS AERUGINOSA MULTI-DRUG RESISTANT ORGANISM CRITICAL RESULT CALLED TO, READ BACK BY AND VERIFIED WITH: Jenkins RN '@[id number]'$  10/03/20 EB Performed at [facility], [place] [address], [place], [place] [address]    Report Status 10/03/2020 FINAL  Final   Organism ID, Bacteria PSEUDOMONAS AERUGINOSA  Final      Susceptibility   Pseudomonas aeruginosa - MIC*    CEFTAZIDIME 16 INTERMEDIATE Intermediate     CIPROFLOXACIN 0.5 SENSITIVE Sensitive     GENTAMICIN >=16 RESISTANT Resistant     IMIPENEM >=16 RESISTANT  Resistant     * FEW PSEUDOMONAS AERUGINOSA  Culture, blood (routine x 2)     Status: Abnormal   Collection Time: 09/29/20  1:07 PM   Specimen: BLOOD RIGHT ARM  Result Value Ref Range Status   Specimen Description BLOOD RIGHT ARM  Final   Special Requests   Final    BOTTLES DRAWN AEROBIC AND ANAEROBIC Blood Culture adequate volume   Culture  Setup Time   Final    GRAM POSITIVE COCCI IN CHAINS AEROBIC BOTTLE ONLY CRITICAL RESULT CALLED TO, READ BACK BY AND VERIFIED WITH: RN S.SNED AT 1233 ON 09/30/2020 BY T.SAAD. Performed at [facility], [place] [name], [place],  [address]    Culture VANCOMYCIN RESISTANT ENTEROCOCCUS (A)  Final   Report Status 10/02/2020 FINAL  Final   Organism ID, Bacteria VANCOMYCIN RESISTANT ENTEROCOCCUS  Final      Susceptibility   Vancomycin resistant enterococcus - MIC*    AMPICILLIN <=2 SENSITIVE Sensitive     VANCOMYCIN >=32 RESISTANT Resistant     GENTAMICIN SYNERGY SENSITIVE Sensitive     LINEZOLID 2 SENSITIVE Sensitive     * VANCOMYCIN RESISTANT ENTEROCOCCUS  Blood Culture ID Panel (Reflexed)     Status: Abnormal   Collection Time: 09/29/20  1:07 PM  Result Value Ref Range Status   Enterococcus faecalis DETECTED (A) NOT DETECTED Final    Comment: CRITICAL RESULT CALLED TO, READ BACK BY AND VERIFIED WITH: RN S.SNED AT 1233 ON 09/30/2020 BY T.SAAD.    Enterococcus Faecium NOT DETECTED NOT DETECTED Final   Listeria monocytogenes NOT  DETECTED NOT DETECTED Final   Staphylococcus species NOT DETECTED NOT DETECTED Final   Staphylococcus aureus (BCID) NOT DETECTED NOT DETECTED Final   Staphylococcus epidermidis NOT DETECTED NOT DETECTED Final   Staphylococcus lugdunensis NOT DETECTED NOT DETECTED Final   Streptococcus species NOT DETECTED NOT DETECTED Final   Streptococcus agalactiae NOT DETECTED NOT DETECTED Final   Streptococcus pneumoniae NOT DETECTED NOT DETECTED Final   Streptococcus pyogenes NOT DETECTED NOT DETECTED Final    A.calcoaceticus-baumannii NOT DETECTED NOT DETECTED Final   Bacteroides fragilis NOT DETECTED NOT DETECTED Final   Enterobacterales NOT DETECTED NOT DETECTED Final   Enterobacter cloacae complex NOT DETECTED NOT DETECTED Final   Escherichia coli NOT DETECTED NOT DETECTED Final   Klebsiella aerogenes NOT DETECTED NOT DETECTED Final   Klebsiella oxytoca NOT DETECTED NOT DETECTED Final   Klebsiella pneumoniae NOT DETECTED NOT DETECTED Final   Proteus species NOT DETECTED NOT DETECTED Final   Salmonella species NOT DETECTED NOT DETECTED Final   Serratia marcescens NOT DETECTED NOT DETECTED Final   Haemophilus influenzae NOT DETECTED NOT DETECTED Final   Neisseria meningitidis NOT DETECTED NOT DETECTED Final   Pseudomonas aeruginosa NOT DETECTED NOT DETECTED Final   Stenotrophomonas maltophilia NOT DETECTED NOT DETECTED Final   Candida albicans NOT DETECTED NOT DETECTED Final   Candida auris NOT DETECTED NOT DETECTED Final   Candida glabrata NOT DETECTED NOT DETECTED Final   Candida krusei NOT DETECTED NOT DETECTED Final   Candida parapsilosis NOT DETECTED NOT DETECTED Final   Candida tropicalis NOT DETECTED NOT DETECTED Final   Cryptococcus neoformans/gattii NOT DETECTED NOT DETECTED Final   Vancomycin resistance DETECTED (A) NOT DETECTED Final    Comment: CRITICAL RESULT CALLED TO, READ BACK BY AND VERIFIED WITH: RN S.SNED AT 1233 ON 09/30/2020 BY T.SAAD. Performed at [facility], [place] [address] [place], [place] [address]   Culture, blood (routine x 2)     Status: None   Collection Time: 09/29/20  1:14 PM   Specimen: BLOOD RIGHT ARM  Result Value Ref Range Status   Specimen Description BLOOD RIGHT ARM  Final   Special Requests   Final    BOTTLES DRAWN AEROBIC AND ANAEROBIC Blood Culture adequate volume   Culture   Final    NO GROWTH 5 DAYS Performed at [facility], [place] [address], [place], [place] [address]    Report Status 10/04/2020 FINAL  Final    Coagulation  Studies: No results for input(s): LABPROT, INR in the last 72 hours.   Urinalysis: No results for input(s): COLORURINE, LABSPEC, PHURINE, GLUCOSEU, HGBUR, BILIRUBINUR, KETONESUR, PROTEINUR, UROBILINOGEN, NITRITE, LEUKOCYTESUR in the last 72 hours.  Invalid input(s): APPERANCEUR    Imaging: DG Abd Portable 1V  Result Date: 10/12/2020 CLINICAL DATA:  Ileus. EXAM: PORTABLE ABDOMEN - 1 VIEW COMPARISON:  10/11/2020 FINDINGS: Diffuse gaseous distension of small and large bowel loops has not significantly changed. A percutaneous gastrostomy tube projects over the left mid abdomen. No acute osseous abnormality is seen. IMPRESSION: Unchanged diffuse bowel distension compatible with ileus. Electronically Signed   By: Logan Bores M.D.   On: 10/12/2020 06:22   DG Abd Portable 1V  Result Date: 10/11/2020 CLINICAL DATA:  45 year old male with history of abdominal distension. Ileus. EXAM: PORTABLE ABDOMEN - 1 VIEW COMPARISON:  Abdominal radiograph 10/08/2020. FINDINGS: Diffuse gaseous distension is noted throughout both small bowel and colon. Small bowel loops measure up to approximately 3.2 cm in diameter. Distal rectal gas and stool are also noted. No  pneumoperitoneum. IMPRESSION: 1. Bowel-gas pattern remains compatible with ileus, as above. Electronically Signed   By: Vinnie Langton M.D.   On: 10/11/2020 07:34     Medications:       Assessment/ Plan:  45 y.o. male with a PMHx of ESRD on HD, posterior reversible encephalopathy syndrome, diabetes mellitus type 2, chronic systolic heart failure, anemia of chronic kidney disease, secondary hyperparathyroidism, malignant hypertension, acute respiratory failure, who was admitted to [facility] on 06/29/2020 for ongoing treatment of acute respiratory failure, posterior reversible encephalopathy syndrome, and end-stage renal disease.   1.  ESRD on HD.  We will continue dialysis on MWF schedule at this time.  Therefore he is due for dialysis  treatment today.  2.  Anemia of chronic kidney disease.  Hemoglobin continues to rise and is currently 8.9.  Maintain the patient on Retacrit and monitor hemoglobin as well as hematocrit.  3.  Secondary hyperparathyroidism.  Phosphorus is slightly high at 6.1 at the moment but should come down with dialysis treatments.  4.  Acute respiratory failure.  Tracheostomy placed 07/17/2020.  Continues on trach collar.  5.  Hypertension.  Continue on amlodipine, clonidine, hydralazine, labetalol, and losartan.  Blood pressure currently 150/92.  We will continue to monitor.    LOS: 0 Jeffery Andrews 10/10/20228:00 AM

## 2020-10-14 LAB — RENAL FUNCTION PANEL
Albumin: <1.5 g/dL — ABNORMAL LOW (ref 3.5–5.0)
Anion gap: 9 (ref 5–15)
BUN: 32 mg/dL — ABNORMAL HIGH (ref 6–20)
CO2: 26 mmol/L (ref 22–32)
Calcium: 8.1 mg/dL — ABNORMAL LOW (ref 8.9–10.3)
Chloride: 88 mmol/L — ABNORMAL LOW (ref 98–111)
Creatinine, Ser: 2.41 mg/dL — ABNORMAL HIGH (ref 0.61–1.24)
GFR, Estimated: 33 mL/min — ABNORMAL LOW (ref >60)
Glucose, Bld: 143 mg/dL — ABNORMAL HIGH (ref 70–99)
Phosphorus: 4 mg/dL (ref 2.5–4.6)
Potassium: 3.8 mmol/L (ref 3.5–5.1)
Sodium: 123 mmol/L — ABNORMAL LOW (ref 135–145)

## 2020-10-14 LAB — CBC
HCT: 23 % — ABNORMAL LOW (ref 39.0–52.0)
Hemoglobin: 6.8 g/dL — CL (ref 13.0–17.0)
MCH: 24.2 pg — ABNORMAL LOW (ref 26.0–34.0)
MCHC: 29.6 g/dL — ABNORMAL LOW (ref 30.0–36.0)
MCV: 81.9 fL (ref 80.0–100.0)
Platelets: 246 10*3/uL (ref 150–400)
RBC: 2.81 MIL/uL — ABNORMAL LOW (ref 4.22–5.81)
RDW: 17.6 % — ABNORMAL HIGH (ref 11.5–15.5)
WBC: 5.8 10*3/uL (ref 4.0–10.5)
nRBC: 0 % (ref 0.0–0.2)

## 2020-10-14 LAB — HEPATITIS B SURFACE ANTIGEN: Hepatitis B Surface Ag: NONREACTIVE

## 2020-10-14 LAB — PREPARE RBC (CROSSMATCH)

## 2020-10-14 NOTE — Progress Notes (Signed)
[facility]  ROUNDING NOTE   Subjective:  Patient due for hemodialysis treatment again today. Hemoglobin has drifted down to 6.8. Has received multiple blood transfusions this admission.  Objective:  Vital signs in last 24 hours:  Temperature 97.3 pulse 62 respirations 40 blood pressure 136/66  Physical Exam: General:  No acute distress  Head:  Normocephalic, atraumatic.    Eyes:  Anicteric  Neck:  Tracheostomy in place  Lungs:   Scattered rhonchi, normal effort  Heart:  S1S2 no rubs  Abdomen:   Soft, nontender, bowel sounds present  Extremities:  No peripheral edema in lower extremities  Neurologic:  Bilateral hand contractures, not following commands  Skin:  No acute skin rash  Access:  Left upper extremity AV fistula, left upper extremity swelling noted    Basic Metabolic Panel: Recent Labs  Lab 10/09/20 0331 10/11/20 1106 10/12/20 0609 10/14/20 0352  NA 126* 126* 124* 123*  K 3.8 4.2 4.6 3.8  CL 90* 90* 88* 88*  CO2 '26 25 24 26  '$ GLUCOSE 102* 89 69* 143*  BUN 51* 50* 57* 32*  CREATININE 2.93* 3.03* 3.30* 2.41*  CALCIUM 8.6* 8.5* 8.7* 8.1*  MG  --  2.4  --   --   PHOS 4.6  --  6.1* 4.0     Liver Function Tests: Recent Labs  Lab 10/09/20 0331 10/12/20 0609 10/14/20 0352  ALBUMIN <1.5* <1.5* <1.5*    No results for input(s): LIPASE, AMYLASE in the last 168 hours. No results for input(s): AMMONIA in the last 168 hours.  CBC: Recent Labs  Lab 10/09/20 0331 10/10/20 0342 10/11/20 1106 10/12/20 0609 10/14/20 0352  WBC 8.5 7.9 7.8 6.3 5.8  HGB 7.7* 7.3* 7.5* 8.9* 6.8*  HCT 25.3* 24.6* 25.0* 29.4* 23.0*  MCV 80.6 82.6 81.4 80.3 81.9  PLT 265 275 262 304 246     Cardiac Enzymes: No results for input(s): CKTOTAL, CKMB, CKMBINDEX, TROPONINI in the last 168 hours.  BNP: Invalid input(s): POCBNP  CBG: No results for input(s): GLUCAP in the last 168 hours.  Microbiology: Results for orders placed or performed during the hospital  encounter of 07/17/20  Culture, blood (routine x 2)     Status: None   Collection Time: 07/27/20 11:17 AM   Specimen: BLOOD RIGHT ARM  Result Value Ref Range Status   Specimen Description BLOOD RIGHT ARM  Final   Special Requests   Final    BOTTLES DRAWN AEROBIC ONLY Blood Culture results may not be optimal due to an inadequate volume of blood received in culture bottles   Culture   Final    NO GROWTH 5 DAYS Performed at [facility], [place] [address], [place], [place] [address]    Report Status 08/01/2020 FINAL  Final  Culture, blood (routine x 2)     Status: None   Collection Time: 07/27/20 11:23 AM   Specimen: BLOOD RIGHT HAND  Result Value Ref Range Status   Specimen Description BLOOD RIGHT HAND  Final   Special Requests   Final    BOTTLES DRAWN AEROBIC ONLY Blood Culture results may not be optimal due to an inadequate volume of blood received in culture bottles   Culture   Final    NO GROWTH 5 DAYS Performed at [facility], [place] [address] [place], [place] [address]    Report Status 08/01/2020 FINAL  Final  Culture, blood (routine x 2)     Status: None   Collection Time: 08/01/20  2:25 PM   Specimen: BLOOD RIGHT HAND  Result Value Ref Range Status   Specimen Description BLOOD RIGHT HAND  Final   Special Requests   Final    BOTTLES DRAWN AEROBIC AND ANAEROBIC Blood Culture results may not be optimal due to an inadequate volume of blood received in culture bottles   Culture   Final    NO GROWTH 5 DAYS Performed at [facility], [place] [address], [place], [place] [address]    Report Status 08/06/2020 FINAL  Final  Culture, blood (routine x 2)     Status: None   Collection Time: 08/01/20  2:39 PM   Specimen: BLOOD RIGHT ARM  Result Value Ref Range Status   Specimen Description BLOOD RIGHT ARM  Final   Special Requests   Final    BOTTLES DRAWN AEROBIC ONLY Blood Culture results may not be optimal due to an inadequate volume of blood received in culture  bottles   Culture   Final    NO GROWTH 5 DAYS Performed at [facility], [place] [address], [place], [place] [address]    Report Status 08/06/2020 FINAL  Final  Culture, Respiratory w Gram Stain     Status: None   Collection Time: 08/01/20  2:55 PM   Specimen: Tracheal Aspirate; Respiratory  Result Value Ref Range Status   Specimen Description TRACHEAL ASPIRATE  Final   Special Requests NONE  Final   Gram Stain   Final    FEW WBC PRESENT, PREDOMINANTLY PMN MODERATE GRAM NEGATIVE RODS Performed at [facility], [place] [address], [place], [place] [address]    Culture ABUNDANT PSEUDOMONAS AERUGINOSA  Final   Report Status 08/04/2020 FINAL  Final   Organism ID, Bacteria PSEUDOMONAS AERUGINOSA  Final      Susceptibility   Pseudomonas aeruginosa - MIC*    CEFTAZIDIME <=1 SENSITIVE Sensitive     CIPROFLOXACIN <=0.25 SENSITIVE Sensitive     GENTAMICIN 8 INTERMEDIATE Intermediate     IMIPENEM >=16 RESISTANT Resistant     PIP/TAZO <=4 SENSITIVE Sensitive     CEFEPIME 2 SENSITIVE Sensitive     * ABUNDANT PSEUDOMONAS AERUGINOSA  Culture, Respiratory w Gram Stain     Status: None   Collection Time: 09/17/20  5:21 PM   Specimen: Tracheal Aspirate; Respiratory  Result Value Ref Range Status   Specimen Description TRACHEAL ASPIRATE  Final   Special Requests NONE  Final   Gram Stain   Final    ABUNDANT WBC PRESENT,BOTH PMN AND MONONUCLEAR MODERATE SQUAMOUS EPITHELIAL CELLS PRESENT MODERATE GRAM POSITIVE COCCI MODERATE GRAM NEGATIVE RODS Performed at [facility], [place] [address], [place], [place] [address]    Culture FEW PSEUDOMONAS AERUGINOSA  Final   Report Status 09/22/2020 FINAL  Final   Organism ID, Bacteria PSEUDOMONAS AERUGINOSA  Final      Susceptibility   Pseudomonas aeruginosa - MIC*    CEFTAZIDIME 2 SENSITIVE Sensitive     CIPROFLOXACIN 1 SENSITIVE Sensitive     GENTAMICIN >=16 RESISTANT Resistant     IMIPENEM >=16 RESISTANT Resistant     PIP/TAZO <=4  SENSITIVE Sensitive     * FEW PSEUDOMONAS AERUGINOSA  Culture, blood (routine x 2)     Status: None   Collection Time: 09/18/20 12:05 PM   Specimen: BLOOD RIGHT ARM  Result Value Ref Range Status   Specimen Description BLOOD RIGHT ARM  Final   Special Requests   Final    BOTTLES DRAWN AEROBIC  AND ANAEROBIC Blood Culture adequate volume   Culture   Final    NO GROWTH 5 DAYS Performed at [facility], [place] [address], [place], [place] [address]    Report Status 09/23/2020 FINAL  Final  Culture, blood (routine x 2)     Status: None   Collection Time: 09/18/20 12:05 PM   Specimen: BLOOD RIGHT HAND  Result Value Ref Range Status   Specimen Description BLOOD RIGHT HAND  Final   Special Requests   Final    BOTTLES DRAWN AEROBIC AND ANAEROBIC Blood Culture adequate volume   Culture   Final    NO GROWTH 5 DAYS Performed at [facility], [place] [address], [place], [place] [address]    Report Status 09/23/2020 FINAL  Final  Culture, Respiratory w Gram Stain     Status: None   Collection Time: 09/29/20 12:06 PM   Specimen: Tracheal Aspirate; Respiratory  Result Value Ref Range Status   Specimen Description TRACHEAL ASPIRATE  Final   Special Requests NONE  Final   Gram Stain   Final    RARE SQUAMOUS EPITHELIAL CELLS PRESENT ABUNDANT WBC PRESENT,BOTH PMN AND MONONUCLEAR ABUNDANT GRAM NEGATIVE RODS    Culture   Final    FEW PSEUDOMONAS AERUGINOSA MULTI-DRUG RESISTANT ORGANISM CRITICAL RESULT CALLED TO, READ BACK BY AND VERIFIED WITH: Geronimo RN '@[id number]'$  10/03/20 EB Performed at [facility], [place] [address], [place], [place] [address]    Report Status 10/03/2020 FINAL  Final   Organism ID, Bacteria PSEUDOMONAS AERUGINOSA  Final      Susceptibility   Pseudomonas aeruginosa - MIC*    CEFTAZIDIME 16 INTERMEDIATE Intermediate     CIPROFLOXACIN 0.5 SENSITIVE Sensitive     GENTAMICIN >=16 RESISTANT Resistant     IMIPENEM >=16 RESISTANT Resistant     * FEW PSEUDOMONAS  AERUGINOSA  Culture, blood (routine x 2)     Status: Abnormal   Collection Time: 09/29/20  1:07 PM   Specimen: BLOOD RIGHT ARM  Result Value Ref Range Status   Specimen Description BLOOD RIGHT ARM  Final   Special Requests   Final    BOTTLES DRAWN AEROBIC AND ANAEROBIC Blood Culture adequate volume   Culture  Setup Time   Final    GRAM POSITIVE COCCI IN CHAINS AEROBIC BOTTLE ONLY CRITICAL RESULT CALLED TO, READ BACK BY AND VERIFIED WITH: RN S.SNED AT 1233 ON 09/30/2020 BY T.SAAD. Performed at [facility], [place] [address] [place], [place] [address]    Culture VANCOMYCIN RESISTANT ENTEROCOCCUS (A)  Final   Report Status 10/02/2020 FINAL  Final   Organism ID, Bacteria VANCOMYCIN RESISTANT ENTEROCOCCUS  Final      Susceptibility   Vancomycin resistant enterococcus - MIC*    AMPICILLIN <=2 SENSITIVE Sensitive     VANCOMYCIN >=32 RESISTANT Resistant     GENTAMICIN SYNERGY SENSITIVE Sensitive     LINEZOLID 2 SENSITIVE Sensitive     * VANCOMYCIN RESISTANT ENTEROCOCCUS  Blood Culture ID Panel (Reflexed)     Status: Abnormal   Collection Time: 09/29/20  1:07 PM  Result Value Ref Range Status   Enterococcus faecalis DETECTED (A) NOT DETECTED Final    Comment: CRITICAL RESULT CALLED TO, READ BACK BY AND VERIFIED WITH: RN S.SNED AT 1233 ON 09/30/2020 BY T.SAAD.    Enterococcus Faecium NOT DETECTED NOT DETECTED Final   Listeria monocytogenes NOT DETECTED NOT DETECTED Final   Staphylococcus species NOT DETECTED NOT DETECTED Final   Staphylococcus aureus (  BCID) NOT DETECTED NOT DETECTED Final   Staphylococcus epidermidis NOT DETECTED NOT DETECTED Final   Staphylococcus lugdunensis NOT DETECTED NOT DETECTED Final   Streptococcus species NOT DETECTED NOT DETECTED Final   Streptococcus agalactiae NOT DETECTED NOT DETECTED Final   Streptococcus pneumoniae NOT DETECTED NOT DETECTED Final   Streptococcus pyogenes NOT DETECTED NOT DETECTED Final   A.calcoaceticus-baumannii NOT DETECTED NOT  DETECTED Final   Bacteroides fragilis NOT DETECTED NOT DETECTED Final   Enterobacterales NOT DETECTED NOT DETECTED Final   Enterobacter cloacae complex NOT DETECTED NOT DETECTED Final   Escherichia coli NOT DETECTED NOT DETECTED Final   Klebsiella aerogenes NOT DETECTED NOT DETECTED Final   Klebsiella oxytoca NOT DETECTED NOT DETECTED Final   Klebsiella pneumoniae NOT DETECTED NOT DETECTED Final   Proteus species NOT DETECTED NOT DETECTED Final   Salmonella species NOT DETECTED NOT DETECTED Final   Serratia marcescens NOT DETECTED NOT DETECTED Final   Haemophilus influenzae NOT DETECTED NOT DETECTED Final   Neisseria meningitidis NOT DETECTED NOT DETECTED Final   Pseudomonas aeruginosa NOT DETECTED NOT DETECTED Final   Stenotrophomonas maltophilia NOT DETECTED NOT DETECTED Final   Candida albicans NOT DETECTED NOT DETECTED Final   Candida auris NOT DETECTED NOT DETECTED Final   Candida glabrata NOT DETECTED NOT DETECTED Final   Candida krusei NOT DETECTED NOT DETECTED Final   Candida parapsilosis NOT DETECTED NOT DETECTED Final   Candida tropicalis NOT DETECTED NOT DETECTED Final   Cryptococcus neoformans/gattii NOT DETECTED NOT DETECTED Final   Vancomycin resistance DETECTED (A) NOT DETECTED Final    Comment: CRITICAL RESULT CALLED TO, READ BACK BY AND VERIFIED WITH: RN S.SNED AT 1233 ON 09/30/2020 BY T.SAAD. Performed at [facility], [place] [address] [place], [place] [address]   Culture, blood (routine x 2)     Status: None   Collection Time: 09/29/20  1:14 PM   Specimen: BLOOD RIGHT ARM  Result Value Ref Range Status   Specimen Description BLOOD RIGHT ARM  Final   Special Requests   Final    BOTTLES DRAWN AEROBIC AND ANAEROBIC Blood Culture adequate volume   Culture   Final    NO GROWTH 5 DAYS Performed at [facility], [place] [address] [place], [place] [address]    Report Status 10/04/2020 FINAL  Final    Coagulation Studies: No results for input(s): LABPROT,  INR in the last 72 hours.   Urinalysis: No results for input(s): COLORURINE, LABSPEC, PHURINE, GLUCOSEU, HGBUR, BILIRUBINUR, KETONESUR, PROTEINUR, UROBILINOGEN, NITRITE, LEUKOCYTESUR in the last 72 hours.  Invalid input(s): APPERANCEUR    Imaging: No results found.   Medications:       Assessment/ Plan:  44 y.o. male with a PMHx of ESRD on HD, posterior reversible encephalopathy syndrome, diabetes mellitus type 2, chronic systolic heart failure, anemia of chronic kidney disease, secondary hyperparathyroidism, malignant hypertension, acute respiratory failure, who was admitted to [facility] on 06/29/2020 for ongoing treatment of acute respiratory failure, posterior reversible encephalopathy syndrome, and end-stage renal disease.   1.  ESRD on HD.  Patient due for hemodialysis treatment today per usual schedule.  2.  Anemia of chronic kidney disease.  Hemoglobin down to 6.8.  Recommend blood transfusion but defer to primary team.  3.  Secondary hyperparathyroidism.  Phosphorus down to 4  4.  Acute respiratory failure.  Tracheostomy placed 07/17/2020.  Currently has T piece in place.   5.  Hypertension.  Continue on amlodipine, clonidine, hydralazine, labetalol, and losartan.  Blood  pressure 136/66 and within target range at the moment.    Kileen Lange 10/12/20228:19 AM

## 2020-10-15 LAB — HEMOGLOBIN AND HEMATOCRIT, BLOOD
HCT: 29.7 % — ABNORMAL LOW (ref 39.0–52.0)
Hemoglobin: 9.1 g/dL — ABNORMAL LOW (ref 13.0–17.0)

## 2020-10-15 LAB — TYPE AND SCREEN
ABO/RH(D): O POS
Antibody Screen: NEGATIVE
Unit division: 0

## 2020-10-15 LAB — BPAM RBC
Blood Product Expiration Date: 202210172359
ISSUE DATE / TIME: 202210122057
Unit Type and Rh: 5100

## 2020-10-16 ENCOUNTER — Other Ambulatory Visit (HOSPITAL_COMMUNITY): Payer: Medicare Other

## 2020-10-16 LAB — CBC
HCT: 23.7 % — ABNORMAL LOW (ref 39.0–52.0)
Hemoglobin: 7.4 g/dL — ABNORMAL LOW (ref 13.0–17.0)
MCH: 25.2 pg — ABNORMAL LOW (ref 26.0–34.0)
MCHC: 31.2 g/dL (ref 30.0–36.0)
MCV: 80.6 fL (ref 80.0–100.0)
Platelets: 219 10*3/uL (ref 150–400)
RBC: 2.94 MIL/uL — ABNORMAL LOW (ref 4.22–5.81)
RDW: 16.7 % — ABNORMAL HIGH (ref 11.5–15.5)
WBC: 4.9 10*3/uL (ref 4.0–10.5)
nRBC: 0 % (ref 0.0–0.2)

## 2020-10-16 LAB — RENAL FUNCTION PANEL
Albumin: <1.5 g/dL — ABNORMAL LOW (ref 3.5–5.0)
Anion gap: 9 (ref 5–15)
BUN: 33 mg/dL — ABNORMAL HIGH (ref 6–20)
CO2: 26 mmol/L (ref 22–32)
Calcium: 7.8 mg/dL — ABNORMAL LOW (ref 8.9–10.3)
Chloride: 90 mmol/L — ABNORMAL LOW (ref 98–111)
Creatinine, Ser: 2.24 mg/dL — ABNORMAL HIGH (ref 0.61–1.24)
GFR, Estimated: 36 mL/min — ABNORMAL LOW (ref >60)
Glucose, Bld: 38 mg/dL — CL (ref 70–99)
Phosphorus: 4.8 mg/dL — ABNORMAL HIGH (ref 2.5–4.6)
Potassium: 4.1 mmol/L (ref 3.5–5.1)
Sodium: 125 mmol/L — ABNORMAL LOW (ref 135–145)

## 2020-10-16 NOTE — Progress Notes (Signed)
[facility]  ROUNDING NOTE   Subjective:  Patient currently sitting up in chair. Due for hemodialysis treatment today. No significant neurologic change.  Objective:  Vital signs in last 24 hours:  Temperature 94.8 pulse 75 respirations 15 blood pressure 115/78  Physical Exam: General:  No acute distress  Head:  Normocephalic, atraumatic.    Eyes:  Anicteric  Neck:  Tracheostomy in place  Lungs:   Scattered rhonchi, normal effort  Heart:  S1S2 no rubs  Abdomen:   Soft, nontender, bowel sounds present  Extremities:  No peripheral edema in lower extremities  Neurologic:  Bilateral hand contractures, not following commands  Skin:  No acute skin rash  Access:  Left upper extremity AV fistula, left upper extremity swelling noted    Basic Metabolic Panel: Recent Labs  Lab 10/11/20 1106 10/12/20 0609 10/14/20 0352 10/16/20 0540  NA 126* 124* 123* 125*  K 4.2 4.6 3.8 4.1  CL 90* 88* 88* 90*  CO2 '25 24 26 26  '$ GLUCOSE 89 69* 143* 38*  BUN 50* 57* 32* 33*  CREATININE 3.03* 3.30* 2.41* 2.24*  CALCIUM 8.5* 8.7* 8.1* 7.8*  MG 2.4  --   --   --   PHOS  --  6.1* 4.0 4.8*     Liver Function Tests: Recent Labs  Lab 10/12/20 0609 10/14/20 0352 10/16/20 0540  ALBUMIN <1.5* <1.5* <1.5*    No results for input(s): LIPASE, AMYLASE in the last 168 hours. No results for input(s): AMMONIA in the last 168 hours.  CBC: Recent Labs  Lab 10/10/20 0342 10/11/20 1106 10/12/20 0609 10/14/20 0352 10/15/20 0419 10/16/20 0540  WBC 7.9 7.8 6.3 5.8  --  4.9  HGB 7.3* 7.5* 8.9* 6.8* 9.1* 7.4*  HCT 24.6* 25.0* 29.4* 23.0* 29.7* 23.7*  MCV 82.6 81.4 80.3 81.9  --  80.6  PLT 275 262 304 246  --  219     Cardiac Enzymes: No results for input(s): CKTOTAL, CKMB, CKMBINDEX, TROPONINI in the last 168 hours.  BNP: Invalid input(s): POCBNP  CBG: No results for input(s): GLUCAP in the last 168 hours.  Microbiology: Results for orders placed or performed during the  hospital encounter of 07/17/20  Culture, blood (routine x 2)     Status: None   Collection Time: 07/27/20 11:17 AM   Specimen: BLOOD RIGHT ARM  Result Value Ref Range Status   Specimen Description BLOOD RIGHT ARM  Final   Special Requests   Final    BOTTLES DRAWN AEROBIC ONLY Blood Culture results may not be optimal due to an inadequate volume of blood received in culture bottles   Culture   Final    NO GROWTH 5 DAYS Performed at [facility], [place] [address], [place], [place] [address]    Report Status 08/01/2020 FINAL  Final  Culture, blood (routine x 2)     Status: None   Collection Time: 07/27/20 11:23 AM   Specimen: BLOOD RIGHT HAND  Result Value Ref Range Status   Specimen Description BLOOD RIGHT HAND  Final   Special Requests   Final    BOTTLES DRAWN AEROBIC ONLY Blood Culture results may not be optimal due to an inadequate volume of blood received in culture bottles   Culture   Final    NO GROWTH 5 DAYS Performed at [facility], [place] [address], [place], [place] [address]    Report Status 08/01/2020 FINAL  Final  Culture, blood (routine x 2)  Status: None   Collection Time: 08/01/20  2:25 PM   Specimen: BLOOD RIGHT HAND  Result Value Ref Range Status   Specimen Description BLOOD RIGHT HAND  Final   Special Requests   Final    BOTTLES DRAWN AEROBIC AND ANAEROBIC Blood Culture results may not be optimal due to an inadequate volume of blood received in culture bottles   Culture   Final    NO GROWTH 5 DAYS Performed at [facility], [place] [address] [place], [place] [address]    Report Status 08/06/2020 FINAL  Final  Culture, blood (routine x 2)     Status: None   Collection Time: 08/01/20  2:39 PM   Specimen: BLOOD RIGHT ARM  Result Value Ref Range Status   Specimen Description BLOOD RIGHT ARM  Final   Special Requests   Final    BOTTLES DRAWN AEROBIC ONLY Blood Culture results may not be optimal due to an inadequate volume of blood received in  culture bottles   Culture   Final    NO GROWTH 5 DAYS Performed at [facility], [place] [address] [place], [place] [address]    Report Status 08/06/2020 FINAL  Final  Culture, Respiratory w Gram Stain     Status: None   Collection Time: 08/01/20  2:55 PM   Specimen: Tracheal Aspirate; Respiratory  Result Value Ref Range Status   Specimen Description TRACHEAL ASPIRATE  Final   Special Requests NONE  Final   Gram Stain   Final    FEW WBC PRESENT, PREDOMINANTLY PMN MODERATE GRAM NEGATIVE RODS Performed at [facility], [place] [address], [place], [place] [address]    Culture ABUNDANT PSEUDOMONAS AERUGINOSA  Final   Report Status 08/04/2020 FINAL  Final   Organism ID, Bacteria PSEUDOMONAS AERUGINOSA  Final      Susceptibility   Pseudomonas aeruginosa - MIC*    CEFTAZIDIME <=1 SENSITIVE Sensitive     CIPROFLOXACIN <=0.25 SENSITIVE Sensitive     GENTAMICIN 8 INTERMEDIATE Intermediate     IMIPENEM >=16 RESISTANT Resistant     PIP/TAZO <=4 SENSITIVE Sensitive     CEFEPIME 2 SENSITIVE Sensitive     * ABUNDANT PSEUDOMONAS AERUGINOSA  Culture, Respiratory w Gram Stain     Status: None   Collection Time: 09/17/20  5:21 PM   Specimen: Tracheal Aspirate; Respiratory  Result Value Ref Range Status   Specimen Description TRACHEAL ASPIRATE  Final   Special Requests NONE  Final   Gram Stain   Final    ABUNDANT WBC PRESENT,BOTH PMN AND MONONUCLEAR MODERATE SQUAMOUS EPITHELIAL CELLS PRESENT MODERATE GRAM POSITIVE COCCI MODERATE GRAM NEGATIVE RODS Performed at [facility], [place] [address] [place], [place] [address]    Culture FEW PSEUDOMONAS AERUGINOSA  Final   Report Status 09/22/2020 FINAL  Final   Organism ID, Bacteria PSEUDOMONAS AERUGINOSA  Final      Susceptibility   Pseudomonas aeruginosa - MIC*    CEFTAZIDIME 2 SENSITIVE Sensitive     CIPROFLOXACIN 1 SENSITIVE Sensitive     GENTAMICIN >=16 RESISTANT Resistant     IMIPENEM >=16 RESISTANT Resistant     PIP/TAZO <=4  SENSITIVE Sensitive     * FEW PSEUDOMONAS AERUGINOSA  Culture, blood (routine x 2)     Status: None   Collection Time: 09/18/20 12:05 PM   Specimen: BLOOD RIGHT ARM  Result Value Ref Range Status   Specimen Description BLOOD RIGHT ARM  Final   Special Requests  Final    BOTTLES DRAWN AEROBIC AND ANAEROBIC Blood Culture adequate volume   Culture   Final    NO GROWTH 5 DAYS Performed at [facility], [place] [address] [place], [place] [address]    Report Status 09/23/2020 FINAL  Final  Culture, blood (routine x 2)     Status: None   Collection Time: 09/18/20 12:05 PM   Specimen: BLOOD RIGHT HAND  Result Value Ref Range Status   Specimen Description BLOOD RIGHT HAND  Final   Special Requests   Final    BOTTLES DRAWN AEROBIC AND ANAEROBIC Blood Culture adequate volume   Culture   Final    NO GROWTH 5 DAYS Performed at [facility], [place] [address], [place], [place] [address]    Report Status 09/23/2020 FINAL  Final  Culture, Respiratory w Gram Stain     Status: None   Collection Time: 09/29/20 12:06 PM   Specimen: Tracheal Aspirate; Respiratory  Result Value Ref Range Status   Specimen Description TRACHEAL ASPIRATE  Final   Special Requests NONE  Final   Gram Stain   Final    RARE SQUAMOUS EPITHELIAL CELLS PRESENT ABUNDANT WBC PRESENT,BOTH PMN AND MONONUCLEAR ABUNDANT GRAM NEGATIVE RODS    Culture   Final    FEW PSEUDOMONAS AERUGINOSA MULTI-DRUG RESISTANT ORGANISM CRITICAL RESULT CALLED TO, READ BACK BY AND VERIFIED WITH: Omak RN '@[id number]'$  10/03/20 EB Performed at [facility], [place] [address], [place], [place] [address]    Report Status 10/03/2020 FINAL  Final   Organism ID, Bacteria PSEUDOMONAS AERUGINOSA  Final      Susceptibility   Pseudomonas aeruginosa - MIC*    CEFTAZIDIME 16 INTERMEDIATE Intermediate     CIPROFLOXACIN 0.5 SENSITIVE Sensitive     GENTAMICIN >=16 RESISTANT Resistant     IMIPENEM >=16 RESISTANT Resistant     * FEW PSEUDOMONAS  AERUGINOSA  Culture, blood (routine x 2)     Status: Abnormal   Collection Time: 09/29/20  1:07 PM   Specimen: BLOOD RIGHT ARM  Result Value Ref Range Status   Specimen Description BLOOD RIGHT ARM  Final   Special Requests   Final    BOTTLES DRAWN AEROBIC AND ANAEROBIC Blood Culture adequate volume   Culture  Setup Time   Final    GRAM POSITIVE COCCI IN CHAINS AEROBIC BOTTLE ONLY CRITICAL RESULT CALLED TO, READ BACK BY AND VERIFIED WITH: RN S.SNED AT 1233 ON 09/30/2020 BY T.SAAD. Performed at [facility], [place] [address], [place],  [address]    Culture VANCOMYCIN RESISTANT ENTEROCOCCUS (A)  Final   Report Status 10/02/2020 FINAL  Final   Organism ID, Bacteria VANCOMYCIN RESISTANT ENTEROCOCCUS  Final      Susceptibility   Vancomycin resistant enterococcus - MIC*    AMPICILLIN <=2 SENSITIVE Sensitive     VANCOMYCIN >=32 RESISTANT Resistant     GENTAMICIN SYNERGY SENSITIVE Sensitive     LINEZOLID 2 SENSITIVE Sensitive     * VANCOMYCIN RESISTANT ENTEROCOCCUS  Blood Culture ID Panel (Reflexed)     Status: Abnormal   Collection Time: 09/29/20  1:07 PM  Result Value Ref Range Status   Enterococcus faecalis DETECTED (A) NOT DETECTED Final    Comment: CRITICAL RESULT CALLED TO, READ BACK BY AND VERIFIED WITH: RN S.SNED AT 1233 ON 09/30/2020 BY T.SAAD.    Enterococcus Faecium NOT DETECTED NOT DETECTED Final   Listeria monocytogenes NOT DETECTED NOT DETECTED Final   Staphylococcus species NOT DETECTED  NOT DETECTED Final   Staphylococcus aureus (BCID) NOT DETECTED NOT DETECTED Final   Staphylococcus epidermidis NOT DETECTED NOT DETECTED Final   Staphylococcus lugdunensis NOT DETECTED NOT DETECTED Final   Streptococcus species NOT DETECTED NOT DETECTED Final   Streptococcus agalactiae NOT DETECTED NOT DETECTED Final   Streptococcus pneumoniae NOT DETECTED NOT DETECTED Final   Streptococcus pyogenes NOT DETECTED NOT DETECTED Final   A.calcoaceticus-baumannii NOT DETECTED NOT  DETECTED Final   Bacteroides fragilis NOT DETECTED NOT DETECTED Final   Enterobacterales NOT DETECTED NOT DETECTED Final   Enterobacter cloacae complex NOT DETECTED NOT DETECTED Final   Escherichia coli NOT DETECTED NOT DETECTED Final   Klebsiella aerogenes NOT DETECTED NOT DETECTED Final   Klebsiella oxytoca NOT DETECTED NOT DETECTED Final   Klebsiella pneumoniae NOT DETECTED NOT DETECTED Final   Proteus species NOT DETECTED NOT DETECTED Final   Salmonella species NOT DETECTED NOT DETECTED Final   Serratia marcescens NOT DETECTED NOT DETECTED Final   Haemophilus influenzae NOT DETECTED NOT DETECTED Final   Neisseria meningitidis NOT DETECTED NOT DETECTED Final   Pseudomonas aeruginosa NOT DETECTED NOT DETECTED Final   Stenotrophomonas maltophilia NOT DETECTED NOT DETECTED Final   Candida albicans NOT DETECTED NOT DETECTED Final   Candida auris NOT DETECTED NOT DETECTED Final   Candida glabrata NOT DETECTED NOT DETECTED Final   Candida krusei NOT DETECTED NOT DETECTED Final   Candida parapsilosis NOT DETECTED NOT DETECTED Final   Candida tropicalis NOT DETECTED NOT DETECTED Final   Cryptococcus neoformans/gattii NOT DETECTED NOT DETECTED Final   Vancomycin resistance DETECTED (A) NOT DETECTED Final    Comment: CRITICAL RESULT CALLED TO, READ BACK BY AND VERIFIED WITH: RN S.SNED AT 1233 ON 09/30/2020 BY T.SAAD. Performed at [facility], [place] [address] [place], [place] [address]   Culture, blood (routine x 2)     Status: None   Collection Time: 09/29/20  1:14 PM   Specimen: BLOOD RIGHT ARM  Result Value Ref Range Status   Specimen Description BLOOD RIGHT ARM  Final   Special Requests   Final    BOTTLES DRAWN AEROBIC AND ANAEROBIC Blood Culture adequate volume   Culture   Final    NO GROWTH 5 DAYS Performed at [facility], [place] [address], [place], [place] [address]    Report Status 10/04/2020 FINAL  Final    Coagulation Studies: No results for input(s): LABPROT,  INR in the last 72 hours.   Urinalysis: No results for input(s): COLORURINE, LABSPEC, PHURINE, GLUCOSEU, HGBUR, BILIRUBINUR, KETONESUR, PROTEINUR, UROBILINOGEN, NITRITE, LEUKOCYTESUR in the last 72 hours.  Invalid input(s): APPERANCEUR    Imaging: No results found.   Medications:       Assessment/ Plan:  45 y.o. male with a PMHx of ESRD on HD, posterior reversible encephalopathy syndrome, diabetes mellitus type 2, chronic systolic heart failure, anemia of chronic kidney disease, secondary hyperparathyroidism, malignant hypertension, acute respiratory failure, who was admitted to [facility] on 06/29/2020 for ongoing treatment of acute respiratory failure, posterior reversible encephalopathy syndrome, and end-stage renal disease.   1.  ESRD on HD.  Maintain the patient on MWF dialysis schedule.  2.  Anemia of chronic kidney disease.  Hemoglobin was up to 9.1 posttransfusion but now has drifted down to 7.4.  Consider blood transfusion but defer to primary team.  3.  Secondary hyperparathyroidism.  Phosphorus currently 4.8 and acceptable.  4.  Acute respiratory failure.  Tracheostomy placed 07/17/2020.  Respiratory status appears to be stable.  5.  Hypertension.  Recently blood pressure has come under better control.  Blood pressure at the moment 115/78.  Continue with current antihypertensive regimen.   Velecia Ovitt 10/14/20228:09 AM

## 2020-10-19 ENCOUNTER — Other Ambulatory Visit (HOSPITAL_COMMUNITY): Payer: Medicare Other

## 2020-10-19 LAB — RENAL FUNCTION PANEL
Albumin: <1.5 g/dL — ABNORMAL LOW (ref 3.5–5.0)
Anion gap: 10 (ref 5–15)
BUN: 49 mg/dL — ABNORMAL HIGH (ref 6–20)
CO2: 24 mmol/L (ref 22–32)
Calcium: 8 mg/dL — ABNORMAL LOW (ref 8.9–10.3)
Chloride: 87 mmol/L — ABNORMAL LOW (ref 98–111)
Creatinine, Ser: 2.76 mg/dL — ABNORMAL HIGH (ref 0.61–1.24)
GFR, Estimated: 28 mL/min — ABNORMAL LOW (ref >60)
Glucose, Bld: 109 mg/dL — ABNORMAL HIGH (ref 70–99)
Phosphorus: 5 mg/dL — ABNORMAL HIGH (ref 2.5–4.6)
Potassium: 3.4 mmol/L — ABNORMAL LOW (ref 3.5–5.1)
Sodium: 121 mmol/L — ABNORMAL LOW (ref 135–145)

## 2020-10-19 LAB — CBC
HCT: 24.9 % — ABNORMAL LOW (ref 39.0–52.0)
Hemoglobin: 7.7 g/dL — ABNORMAL LOW (ref 13.0–17.0)
MCH: 24.8 pg — ABNORMAL LOW (ref 26.0–34.0)
MCHC: 30.9 g/dL (ref 30.0–36.0)
MCV: 80.1 fL (ref 80.0–100.0)
Platelets: 189 10*3/uL (ref 150–400)
RBC: 3.11 MIL/uL — ABNORMAL LOW (ref 4.22–5.81)
RDW: 15.9 % — ABNORMAL HIGH (ref 11.5–15.5)
WBC: 6 10*3/uL (ref 4.0–10.5)
nRBC: 0 % (ref 0.0–0.2)

## 2020-10-19 NOTE — Progress Notes (Signed)
[facility]  ROUNDING NOTE   Subjective:  Patient resting in bed at the moment. Remains on T-piece at the moment for respiratory support. Patient to receive dialysis treatment today.  Objective:  Vital signs in last 24 hours:  Temperature 97.1 pulse 60 respirations 7 blood pressure 132/81  Physical Exam: General:  No acute distress  Head:  Normocephalic, atraumatic.    Eyes:  Anicteric  Neck:  Tracheostomy in place  Lungs:   Scattered rhonchi, normal effort  Heart:  S1S2 no rubs  Abdomen:   Soft, nontender, bowel sounds present  Extremities:  No peripheral edema in lower extremities  Neurologic:  Bilateral hand contractures, not following commands  Skin:  No acute skin rash  Access:  Left upper extremity AV fistula, left upper extremity swelling noted    Basic Metabolic Panel: Recent Labs  Lab 10/14/20 0352 10/16/20 0540 10/19/20 0510  NA 123* 125* 121*  K 3.8 4.1 3.4*  CL 88* 90* 87*  CO2 '26 26 24  '$ GLUCOSE 143* 38* 109*  BUN 32* 33* 49*  CREATININE 2.41* 2.24* 2.76*  CALCIUM 8.1* 7.8* 8.0*  PHOS 4.0 4.8* 5.0*     Liver Function Tests: Recent Labs  Lab 10/14/20 0352 10/16/20 0540 10/19/20 0510  ALBUMIN <1.5* <1.5* <1.5*    No results for input(s): LIPASE, AMYLASE in the last 168 hours. No results for input(s): AMMONIA in the last 168 hours.  CBC: Recent Labs  Lab 10/14/20 0352 10/15/20 0419 10/16/20 0540 10/19/20 0510  WBC 5.8  --  4.9 6.0  HGB 6.8* 9.1* 7.4* 7.7*  HCT 23.0* 29.7* 23.7* 24.9*  MCV 81.9  --  80.6 80.1  PLT 246  --  219 189     Cardiac Enzymes: No results for input(s): CKTOTAL, CKMB, CKMBINDEX, TROPONINI in the last 168 hours.  BNP: Invalid input(s): POCBNP  CBG: No results for input(s): GLUCAP in the last 168 hours.  Microbiology: Results for orders placed or performed during the hospital encounter of 07/17/20  Culture, blood (routine x 2)     Status: None   Collection Time: 07/27/20 11:17 AM   Specimen:  BLOOD RIGHT ARM  Result Value Ref Range Status   Specimen Description BLOOD RIGHT ARM  Final   Special Requests   Final    BOTTLES DRAWN AEROBIC ONLY Blood Culture results may not be optimal due to an inadequate volume of blood received in culture bottles   Culture   Final    NO GROWTH 5 DAYS Performed at [facility], [place] [address] [place], [place] [address]    Report Status 08/01/2020 FINAL  Final  Culture, blood (routine x 2)     Status: None   Collection Time: 07/27/20 11:23 AM   Specimen: BLOOD RIGHT HAND  Result Value Ref Range Status   Specimen Description BLOOD RIGHT HAND  Final   Special Requests   Final    BOTTLES DRAWN AEROBIC ONLY Blood Culture results may not be optimal due to an inadequate volume of blood received in culture bottles   Culture   Final    NO GROWTH 5 DAYS Performed at [facility], [place] [address] [place], [place] [address]    Report Status 08/01/2020 FINAL  Final  Culture, blood (routine x 2)     Status: None   Collection Time: 08/01/20  2:25 PM   Specimen: BLOOD RIGHT HAND  Result Value Ref Range Status   Specimen Description BLOOD RIGHT HAND  Final  Special Requests   Final    BOTTLES DRAWN AEROBIC AND ANAEROBIC Blood Culture results may not be optimal due to an inadequate volume of blood received in culture bottles   Culture   Final    NO GROWTH 5 DAYS Performed at [facility], [place] [address], [place], [place] [address]    Report Status 08/06/2020 FINAL  Final  Culture, blood (routine x 2)     Status: None   Collection Time: 08/01/20  2:39 PM   Specimen: BLOOD RIGHT ARM  Result Value Ref Range Status   Specimen Description BLOOD RIGHT ARM  Final   Special Requests   Final    BOTTLES DRAWN AEROBIC ONLY Blood Culture results may not be optimal due to an inadequate volume of blood received in culture bottles   Culture   Final    NO GROWTH 5 DAYS Performed at [facility], [place] [address] [place], [place] [address]     Report Status 08/06/2020 FINAL  Final  Culture, Respiratory w Gram Stain     Status: None   Collection Time: 08/01/20  2:55 PM   Specimen: Tracheal Aspirate; Respiratory  Result Value Ref Range Status   Specimen Description TRACHEAL ASPIRATE  Final   Special Requests NONE  Final   Gram Stain   Final    FEW WBC PRESENT, PREDOMINANTLY PMN MODERATE GRAM NEGATIVE RODS Performed at [facility], [place] [address], [place], [place] [address]    Culture ABUNDANT PSEUDOMONAS AERUGINOSA  Final   Report Status 08/04/2020 FINAL  Final   Organism ID, Bacteria PSEUDOMONAS AERUGINOSA  Final      Susceptibility   Pseudomonas aeruginosa - MIC*    CEFTAZIDIME <=1 SENSITIVE Sensitive     CIPROFLOXACIN <=0.25 SENSITIVE Sensitive     GENTAMICIN 8 INTERMEDIATE Intermediate     IMIPENEM >=16 RESISTANT Resistant     PIP/TAZO <=4 SENSITIVE Sensitive     CEFEPIME 2 SENSITIVE Sensitive     * ABUNDANT PSEUDOMONAS AERUGINOSA  Culture, Respiratory w Gram Stain     Status: None   Collection Time: 09/17/20  5:21 PM   Specimen: Tracheal Aspirate; Respiratory  Result Value Ref Range Status   Specimen Description TRACHEAL ASPIRATE  Final   Special Requests NONE  Final   Gram Stain   Final    ABUNDANT WBC PRESENT,BOTH PMN AND MONONUCLEAR MODERATE SQUAMOUS EPITHELIAL CELLS PRESENT MODERATE GRAM POSITIVE COCCI MODERATE GRAM NEGATIVE RODS Performed at [facility], [place] [address] [place],  [address]    Culture FEW PSEUDOMONAS AERUGINOSA  Final   Report Status 09/22/2020 FINAL  Final   Organism ID, Bacteria PSEUDOMONAS AERUGINOSA  Final      Susceptibility   Pseudomonas aeruginosa - MIC*    CEFTAZIDIME 2 SENSITIVE Sensitive     CIPROFLOXACIN 1 SENSITIVE Sensitive     GENTAMICIN >=16 RESISTANT Resistant     IMIPENEM >=16 RESISTANT Resistant     PIP/TAZO <=4 SENSITIVE Sensitive     * FEW PSEUDOMONAS AERUGINOSA  Culture, blood (routine x 2)     Status: None   Collection Time: 09/18/20  12:05 PM   Specimen: BLOOD RIGHT ARM  Result Value Ref Range Status   Specimen Description BLOOD RIGHT ARM  Final   Special Requests   Final    BOTTLES DRAWN AEROBIC AND ANAEROBIC Blood Culture adequate volume   Culture   Final    NO GROWTH 5 DAYS Performed at [facility],  [address], [place], [place] [address]    Report Status 09/23/2020 FINAL  Final  Culture, blood (routine x 2)     Status: None   Collection Time: 09/18/20 12:05 PM   Specimen: BLOOD RIGHT HAND  Result Value Ref Range Status   Specimen Description BLOOD RIGHT HAND  Final   Special Requests   Final    BOTTLES DRAWN AEROBIC AND ANAEROBIC Blood Culture adequate volume   Culture   Final    NO GROWTH 5 DAYS Performed at [facility], [place] [address] [place], [place] [address]    Report Status 09/23/2020 FINAL  Final  Culture, Respiratory w Gram Stain     Status: None   Collection Time: 09/29/20 12:06 PM   Specimen: Tracheal Aspirate; Respiratory  Result Value Ref Range Status   Specimen Description TRACHEAL ASPIRATE  Final   Special Requests NONE  Final   Gram Stain   Final    RARE SQUAMOUS EPITHELIAL CELLS PRESENT ABUNDANT WBC PRESENT,BOTH PMN AND MONONUCLEAR ABUNDANT GRAM NEGATIVE RODS    Culture   Final    FEW PSEUDOMONAS AERUGINOSA MULTI-DRUG RESISTANT ORGANISM CRITICAL RESULT CALLED TO, READ BACK BY AND VERIFIED WITH: [place]n RN '@[id number]'$  10/03/20 EB Performed at [facility], [place] [address] [place], [place] [address]    Report Status 10/03/2020 FINAL  Final   Organism ID, Bacteria PSEUDOMONAS AERUGINOSA  Final      Susceptibility   Pseudomonas aeruginosa - MIC*    CEFTAZIDIME 16 INTERMEDIATE Intermediate     CIPROFLOXACIN 0.5 SENSITIVE Sensitive     GENTAMICIN >=16 RESISTANT Resistant     IMIPENEM >=16 RESISTANT Resistant     * FEW PSEUDOMONAS AERUGINOSA  Culture, blood (routine x 2)     Status: Abnormal   Collection Time: 09/29/20  1:07 PM   Specimen: BLOOD RIGHT ARM  Result  Value Ref Range Status   Specimen Description BLOOD RIGHT ARM  Final   Special Requests   Final    BOTTLES DRAWN AEROBIC AND ANAEROBIC Blood Culture adequate volume   Culture  Setup Time   Final    GRAM POSITIVE COCCI IN CHAINS AEROBIC BOTTLE ONLY CRITICAL RESULT CALLED TO, READ BACK BY AND VERIFIED WITH: RN S.SNED AT 1233 ON 09/30/2020 BY T.SAAD. Performed at [facility], [place] [address] [place], [place] [address]    Culture VANCOMYCIN RESISTANT ENTEROCOCCUS (A)  Final   Report Status 10/02/2020 FINAL  Final   Organism ID, Bacteria VANCOMYCIN RESISTANT ENTEROCOCCUS  Final      Susceptibility   Vancomycin resistant enterococcus - MIC*    AMPICILLIN <=2 SENSITIVE Sensitive     VANCOMYCIN >=32 RESISTANT Resistant     GENTAMICIN SYNERGY SENSITIVE Sensitive     LINEZOLID 2 SENSITIVE Sensitive     * VANCOMYCIN RESISTANT ENTEROCOCCUS  Blood Culture ID Panel (Reflexed)     Status: Abnormal   Collection Time: 09/29/20  1:07 PM  Result Value Ref Range Status   Enterococcus faecalis DETECTED (A) NOT DETECTED Final    Comment: CRITICAL RESULT CALLED TO, READ BACK BY AND VERIFIED WITH: RN S.SNED AT 1233 ON 09/30/2020 BY T.SAAD.    Enterococcus Faecium NOT DETECTED NOT DETECTED Final   Listeria monocytogenes NOT DETECTED NOT DETECTED Final   Staphylococcus species NOT DETECTED NOT DETECTED Final   Staphylococcus aureus (BCID) NOT DETECTED NOT DETECTED Final   Staphylococcus epidermidis NOT DETECTED NOT DETECTED Final   Staphylococcus lugdunensis NOT DETECTED NOT DETECTED Final  Streptococcus species NOT DETECTED NOT DETECTED Final   Streptococcus agalactiae NOT DETECTED NOT DETECTED Final   Streptococcus pneumoniae NOT DETECTED NOT DETECTED Final   Streptococcus pyogenes NOT DETECTED NOT DETECTED Final   A.calcoaceticus-baumannii NOT DETECTED NOT DETECTED Final   Bacteroides fragilis NOT DETECTED NOT DETECTED Final   Enterobacterales NOT DETECTED NOT DETECTED Final   Enterobacter  cloacae complex NOT DETECTED NOT DETECTED Final   Escherichia coli NOT DETECTED NOT DETECTED Final   Klebsiella aerogenes NOT DETECTED NOT DETECTED Final   Klebsiella oxytoca NOT DETECTED NOT DETECTED Final   Klebsiella pneumoniae NOT DETECTED NOT DETECTED Final   Proteus species NOT DETECTED NOT DETECTED Final   Salmonella species NOT DETECTED NOT DETECTED Final   Serratia marcescens NOT DETECTED NOT DETECTED Final   Haemophilus influenzae NOT DETECTED NOT DETECTED Final   Neisseria meningitidis NOT DETECTED NOT DETECTED Final   Pseudomonas aeruginosa NOT DETECTED NOT DETECTED Final   Stenotrophomonas maltophilia NOT DETECTED NOT DETECTED Final   Candida albicans NOT DETECTED NOT DETECTED Final   Candida auris NOT DETECTED NOT DETECTED Final   Candida glabrata NOT DETECTED NOT DETECTED Final   Candida krusei NOT DETECTED NOT DETECTED Final   Candida parapsilosis NOT DETECTED NOT DETECTED Final   Candida tropicalis NOT DETECTED NOT DETECTED Final   Cryptococcus neoformans/gattii NOT DETECTED NOT DETECTED Final   Vancomycin resistance DETECTED (A) NOT DETECTED Final    Comment: CRITICAL RESULT CALLED TO, READ BACK BY AND VERIFIED WITH: RN S.SNED AT 1233 ON 09/30/2020 BY T.SAAD. Performed at [facility], [place] [address], [place], [place] [address]   Culture, blood (routine x 2)     Status: None   Collection Time: 09/29/20  1:14 PM   Specimen: BLOOD RIGHT ARM  Result Value Ref Range Status   Specimen Description BLOOD RIGHT ARM  Final   Special Requests   Final    BOTTLES DRAWN AEROBIC AND ANAEROBIC Blood Culture adequate volume   Culture   Final    NO GROWTH 5 DAYS Performed at [facility], [place] [address] [place], [place] [address]    Report Status 10/04/2020 FINAL  Final    Coagulation Studies: No results for input(s): LABPROT, INR in the last 72 hours.   Urinalysis: No results for input(s): COLORURINE, LABSPEC, PHURINE, GLUCOSEU, HGBUR, BILIRUBINUR, KETONESUR,  PROTEINUR, UROBILINOGEN, NITRITE, LEUKOCYTESUR in the last 72 hours.  Invalid input(s): APPERANCEUR    Imaging: No results found.   Medications:       Assessment/ Plan:  45 y.o. male with a PMHx of ESRD on HD, posterior reversible encephalopathy syndrome, diabetes mellitus type 2, chronic systolic heart failure, anemia of chronic kidney disease, secondary hyperparathyroidism, malignant hypertension, acute respiratory failure, who was admitted to [facility] on 06/29/2020 for ongoing treatment of acute respiratory failure, posterior reversible encephalopathy syndrome, and end-stage renal disease.   1.  ESRD on HD.  The patient is due for hemodialysis treatment today.  Continue dialysis on a regular schedule of MWF.  2.  Anemia of chronic kidney disease.  Hemoglobin up slightly to 7.7.  Continue to monitor CBC closely.  Continue Retacrit 6000 units IV with dialysis treatments.  3.  Secondary hyperparathyroidism.  Phosphorus currently 5.0 and should come down with dialysis treatment today.  4.  Acute respiratory failure.  Tracheostomy placed 07/17/2020.  Currently requiring respiratory support with T-piece.  5.  Hypertension.  Blood pressure this a.m. 132/81.  Patient be maintained on amlodipine, clonidine, hydralazine, labetalol, and  losartan.   Jeffery Andrews 10/17/20228:23 AM

## 2020-10-20 ENCOUNTER — Other Ambulatory Visit (HOSPITAL_COMMUNITY): Payer: Medicare Other

## 2020-10-20 LAB — CBC
HCT: 25.2 % — ABNORMAL LOW (ref 39.0–52.0)
Hemoglobin: 7.8 g/dL — ABNORMAL LOW (ref 13.0–17.0)
MCH: 24.9 pg — ABNORMAL LOW (ref 26.0–34.0)
MCHC: 31 g/dL (ref 30.0–36.0)
MCV: 80.5 fL (ref 80.0–100.0)
Platelets: 166 10*3/uL (ref 150–400)
RBC: 3.13 MIL/uL — ABNORMAL LOW (ref 4.22–5.81)
RDW: 16.1 % — ABNORMAL HIGH (ref 11.5–15.5)
WBC: 5.5 10*3/uL (ref 4.0–10.5)
nRBC: 0 % (ref 0.0–0.2)

## 2020-10-20 LAB — LACTIC ACID, PLASMA: Lactic Acid, Venous: 0.8 mmol/L (ref 0.5–1.9)

## 2020-10-21 LAB — RENAL FUNCTION PANEL
Albumin: <1.5 g/dL — ABNORMAL LOW (ref 3.5–5.0)
Anion gap: 10 (ref 5–15)
BUN: 40 mg/dL — ABNORMAL HIGH (ref 6–20)
CO2: 22 mmol/L (ref 22–32)
Calcium: 7.8 mg/dL — ABNORMAL LOW (ref 8.9–10.3)
Chloride: 86 mmol/L — ABNORMAL LOW (ref 98–111)
Creatinine, Ser: 2.66 mg/dL — ABNORMAL HIGH (ref 0.61–1.24)
GFR, Estimated: 29 mL/min — ABNORMAL LOW (ref >60)
Glucose, Bld: 146 mg/dL — ABNORMAL HIGH (ref 70–99)
Phosphorus: 4.2 mg/dL (ref 2.5–4.6)
Potassium: 3.5 mmol/L (ref 3.5–5.1)
Sodium: 118 mmol/L — CL (ref 135–145)

## 2020-10-21 LAB — CBC
HCT: 25 % — ABNORMAL LOW (ref 39.0–52.0)
Hemoglobin: 7.8 g/dL — ABNORMAL LOW (ref 13.0–17.0)
MCH: 25 pg — ABNORMAL LOW (ref 26.0–34.0)
MCHC: 31.2 g/dL (ref 30.0–36.0)
MCV: 80.1 fL (ref 80.0–100.0)
Platelets: 173 10*3/uL (ref 150–400)
RBC: 3.12 MIL/uL — ABNORMAL LOW (ref 4.22–5.81)
RDW: 16.1 % — ABNORMAL HIGH (ref 11.5–15.5)
WBC: 5.3 10*3/uL (ref 4.0–10.5)
nRBC: 0 % (ref 0.0–0.2)

## 2020-10-21 NOTE — Progress Notes (Signed)
[facility]  ROUNDING NOTE   Subjective:  Serum sodium 1 has dropped further. Sodium down to 118. Patient has been receiving D10 infusion as well as free water flush.  Objective:  Vital signs in last 24 hours:  Temperature 98 pulse 74 respirations 14 blood pressure 167/97  Physical Exam: General:  No acute distress  Head:  Normocephalic, atraumatic.    Eyes:  Anicteric  Neck:  Tracheostomy in place  Lungs:   Scattered rhonchi, normal effort  Heart:  S1S2 no rubs  Abdomen:   Soft, nontender, bowel sounds present  Extremities:  No peripheral edema in lower extremities  Neurologic:  Bilateral hand contractures, not following commands  Skin:  No acute skin rash  Access:  Left upper extremity AV fistula, left upper extremity swelling noted    Basic Metabolic Panel: Recent Labs  Lab 10/16/20 0540 10/19/20 0510 10/21/20 0448  NA 125* 121* 118*  K 4.1 3.4* 3.5  CL 90* 87* 86*  CO2 '26 24 22  '$ GLUCOSE 38* 109* 146*  BUN 33* 49* 40*  CREATININE 2.24* 2.76* 2.66*  CALCIUM 7.8* 8.0* 7.8*  PHOS 4.8* 5.0* 4.2     Liver Function Tests: Recent Labs  Lab 10/16/20 0540 10/19/20 0510 10/21/20 0448  ALBUMIN <1.5* <1.5* <1.5*    No results for input(s): LIPASE, AMYLASE in the last 168 hours. No results for input(s): AMMONIA in the last 168 hours.  CBC: Recent Labs  Lab 10/15/20 0419 10/16/20 0540 10/19/20 0510 10/20/20 0957 10/21/20 0448  WBC  --  4.9 6.0 5.5 5.3  HGB 9.1* 7.4* 7.7* 7.8* 7.8*  HCT 29.7* 23.7* 24.9* 25.2* 25.0*  MCV  --  80.6 80.1 80.5 80.1  PLT  --  219 189 166 173     Cardiac Enzymes: No results for input(s): CKTOTAL, CKMB, CKMBINDEX, TROPONINI in the last 168 hours.  BNP: Invalid input(s): POCBNP  CBG: No results for input(s): GLUCAP in the last 168 hours.  Microbiology: Results for orders placed or performed during the hospital encounter of 07/17/20  Culture, blood (routine x 2)     Status: None   Collection Time: 07/27/20  11:17 AM   Specimen: BLOOD RIGHT ARM  Result Value Ref Range Status   Specimen Description BLOOD RIGHT ARM  Final   Special Requests   Final    BOTTLES DRAWN AEROBIC ONLY Blood Culture results may not be optimal due to an inadequate volume of blood received in culture bottles   Culture   Final    NO GROWTH 5 DAYS Performed at [facility], [place] [address], [place], [place] [address]    Report Status 08/01/2020 FINAL  Final  Culture, blood (routine x 2)     Status: None   Collection Time: 07/27/20 11:23 AM   Specimen: BLOOD RIGHT HAND  Result Value Ref Range Status   Specimen Description BLOOD RIGHT HAND  Final   Special Requests   Final    BOTTLES DRAWN AEROBIC ONLY Blood Culture results may not be optimal due to an inadequate volume of blood received in culture bottles   Culture   Final    NO GROWTH 5 DAYS Performed at [facility], [place] [address], [place], [place] [address]    Report Status 08/01/2020 FINAL  Final  Culture, blood (routine x 2)     Status: None   Collection Time: 08/01/20  2:25 PM   Specimen: BLOOD RIGHT HAND  Result Value Ref Range Status  Specimen Description BLOOD RIGHT HAND  Final   Special Requests   Final    BOTTLES DRAWN AEROBIC AND ANAEROBIC Blood Culture results may not be optimal due to an inadequate volume of blood received in culture bottles   Culture   Final    NO GROWTH 5 DAYS Performed at [facility], [place] [address], [place], [place] [address]    Report Status 08/06/2020 FINAL  Final  Culture, blood (routine x 2)     Status: None   Collection Time: 08/01/20  2:39 PM   Specimen: BLOOD RIGHT ARM  Result Value Ref Range Status   Specimen Description BLOOD RIGHT ARM  Final   Special Requests   Final    BOTTLES DRAWN AEROBIC ONLY Blood Culture results may not be optimal due to an inadequate volume of blood received in culture bottles   Culture   Final    NO GROWTH 5 DAYS Performed at [facility], [place] [address]  [place], [place] [address]    Report Status 08/06/2020 FINAL  Final  Culture, Respiratory w Gram Stain     Status: None   Collection Time: 08/01/20  2:55 PM   Specimen: Tracheal Aspirate; Respiratory  Result Value Ref Range Status   Specimen Description TRACHEAL ASPIRATE  Final   Special Requests NONE  Final   Gram Stain   Final    FEW WBC PRESENT, PREDOMINANTLY PMN MODERATE GRAM NEGATIVE RODS Performed at [facility], [place] [address], [place], [place] [address]    Culture ABUNDANT PSEUDOMONAS AERUGINOSA  Final   Report Status 08/04/2020 FINAL  Final   Organism ID, Bacteria PSEUDOMONAS AERUGINOSA  Final      Susceptibility   Pseudomonas aeruginosa - MIC*    CEFTAZIDIME <=1 SENSITIVE Sensitive     CIPROFLOXACIN <=0.25 SENSITIVE Sensitive     GENTAMICIN 8 INTERMEDIATE Intermediate     IMIPENEM >=16 RESISTANT Resistant     PIP/TAZO <=4 SENSITIVE Sensitive     CEFEPIME 2 SENSITIVE Sensitive     * ABUNDANT PSEUDOMONAS AERUGINOSA  Culture, Respiratory w Gram Stain     Status: None   Collection Time: 09/17/20  5:21 PM   Specimen: Tracheal Aspirate; Respiratory  Result Value Ref Range Status   Specimen Description TRACHEAL ASPIRATE  Final   Special Requests NONE  Final   Gram Stain   Final    ABUNDANT WBC PRESENT,BOTH PMN AND MONONUCLEAR MODERATE SQUAMOUS EPITHELIAL CELLS PRESENT MODERATE GRAM POSITIVE COCCI MODERATE GRAM NEGATIVE RODS Performed at [facility], [place] [address], [place], [place] [address]    Culture FEW PSEUDOMONAS AERUGINOSA  Final   Report Status 09/22/2020 FINAL  Final   Organism ID, Bacteria PSEUDOMONAS AERUGINOSA  Final      Susceptibility   Pseudomonas aeruginosa - MIC*    CEFTAZIDIME 2 SENSITIVE Sensitive     CIPROFLOXACIN 1 SENSITIVE Sensitive     GENTAMICIN >=16 RESISTANT Resistant     IMIPENEM >=16 RESISTANT Resistant     PIP/TAZO <=4 SENSITIVE Sensitive     * FEW PSEUDOMONAS AERUGINOSA  Culture, blood (routine x 2)     Status: None    Collection Time: 09/18/20 12:05 PM   Specimen: BLOOD RIGHT ARM  Result Value Ref Range Status   Specimen Description BLOOD RIGHT ARM  Final   Special Requests   Final    BOTTLES DRAWN AEROBIC AND ANAEROBIC Blood Culture adequate volume   Culture   Final    NO  GROWTH 5 DAYS Performed at [facility], [place] [address] [place], [place] [address]    Report Status 09/23/2020 FINAL  Final  Culture, blood (routine x 2)     Status: None   Collection Time: 09/18/20 12:05 PM   Specimen: BLOOD RIGHT HAND  Result Value Ref Range Status   Specimen Description BLOOD RIGHT HAND  Final   Special Requests   Final    BOTTLES DRAWN AEROBIC AND ANAEROBIC Blood Culture adequate volume   Culture   Final    NO GROWTH 5 DAYS Performed at [facility], [place] [address], [place], [place] [address]    Report Status 09/23/2020 FINAL  Final  Culture, Respiratory w Gram Stain     Status: None   Collection Time: 09/29/20 12:06 PM   Specimen: Tracheal Aspirate; Respiratory  Result Value Ref Range Status   Specimen Description TRACHEAL ASPIRATE  Final   Special Requests NONE  Final   Gram Stain   Final    RARE SQUAMOUS EPITHELIAL CELLS PRESENT ABUNDANT WBC PRESENT,BOTH PMN AND MONONUCLEAR ABUNDANT GRAM NEGATIVE RODS    Culture   Final    FEW PSEUDOMONAS AERUGINOSA MULTI-DRUG RESISTANT ORGANISM CRITICAL RESULT CALLED TO, READ BACK BY AND VERIFIED WITH: [place] RN '@[id number]'$  10/03/20 EB Performed at [facility], [place] [address], [place], [place] [address]    Report Status 10/03/2020 FINAL  Final   Organism ID, Bacteria PSEUDOMONAS AERUGINOSA  Final      Susceptibility   Pseudomonas aeruginosa - MIC*    CEFTAZIDIME 16 INTERMEDIATE Intermediate     CIPROFLOXACIN 0.5 SENSITIVE Sensitive     GENTAMICIN >=16 RESISTANT Resistant     IMIPENEM >=16 RESISTANT Resistant     * FEW PSEUDOMONAS AERUGINOSA  Culture, blood (routine x 2)     Status: Abnormal   Collection Time: 09/29/20  1:07 PM   Specimen:  BLOOD RIGHT ARM  Result Value Ref Range Status   Specimen Description BLOOD RIGHT ARM  Final   Special Requests   Final    BOTTLES DRAWN AEROBIC AND ANAEROBIC Blood Culture adequate volume   Culture  Setup Time   Final    GRAM POSITIVE COCCI IN CHAINS AEROBIC BOTTLE ONLY CRITICAL RESULT CALLED TO, READ BACK BY AND VERIFIED WITH: RN S.SNED AT 1233 ON 09/30/2020 BY T.SAAD. Performed at [facility], [place] [address] [place], [place] [address]    Culture VANCOMYCIN RESISTANT ENTEROCOCCUS (A)  Final   Report Status 10/02/2020 FINAL  Final   Organism ID, Bacteria VANCOMYCIN RESISTANT ENTEROCOCCUS  Final      Susceptibility   Vancomycin resistant enterococcus - MIC*    AMPICILLIN <=2 SENSITIVE Sensitive     VANCOMYCIN >=32 RESISTANT Resistant     GENTAMICIN SYNERGY SENSITIVE Sensitive     LINEZOLID 2 SENSITIVE Sensitive     * VANCOMYCIN RESISTANT ENTEROCOCCUS  Blood Culture ID Panel (Reflexed)     Status: Abnormal   Collection Time: 09/29/20  1:07 PM  Result Value Ref Range Status   Enterococcus faecalis DETECTED (A) NOT DETECTED Final    Comment: CRITICAL RESULT CALLED TO, READ BACK BY AND VERIFIED WITH: RN S.SNED AT 1233 ON 09/30/2020 BY T.SAAD.    Enterococcus Faecium NOT DETECTED NOT DETECTED Final   Listeria monocytogenes NOT DETECTED NOT DETECTED Final   Staphylococcus species NOT DETECTED NOT DETECTED Final   Staphylococcus aureus (BCID) NOT DETECTED NOT DETECTED Final   Staphylococcus epidermidis NOT DETECTED NOT DETECTED Final  Staphylococcus lugdunensis NOT DETECTED NOT DETECTED Final   Streptococcus species NOT DETECTED NOT DETECTED Final   Streptococcus agalactiae NOT DETECTED NOT DETECTED Final   Streptococcus pneumoniae NOT DETECTED NOT DETECTED Final   Streptococcus pyogenes NOT DETECTED NOT DETECTED Final   A.calcoaceticus-baumannii NOT DETECTED NOT DETECTED Final   Bacteroides fragilis NOT DETECTED NOT DETECTED Final   Enterobacterales NOT DETECTED NOT  DETECTED Final   Enterobacter cloacae complex NOT DETECTED NOT DETECTED Final   Escherichia coli NOT DETECTED NOT DETECTED Final   Klebsiella aerogenes NOT DETECTED NOT DETECTED Final   Klebsiella oxytoca NOT DETECTED NOT DETECTED Final   Klebsiella pneumoniae NOT DETECTED NOT DETECTED Final   Proteus species NOT DETECTED NOT DETECTED Final   Salmonella species NOT DETECTED NOT DETECTED Final   Serratia marcescens NOT DETECTED NOT DETECTED Final   Haemophilus influenzae NOT DETECTED NOT DETECTED Final   Neisseria meningitidis NOT DETECTED NOT DETECTED Final   Pseudomonas aeruginosa NOT DETECTED NOT DETECTED Final   Stenotrophomonas maltophilia NOT DETECTED NOT DETECTED Final   Candida albicans NOT DETECTED NOT DETECTED Final   Candida auris NOT DETECTED NOT DETECTED Final   Candida glabrata NOT DETECTED NOT DETECTED Final   Candida krusei NOT DETECTED NOT DETECTED Final   Candida parapsilosis NOT DETECTED NOT DETECTED Final   Candida tropicalis NOT DETECTED NOT DETECTED Final   Cryptococcus neoformans/gattii NOT DETECTED NOT DETECTED Final   Vancomycin resistance DETECTED (A) NOT DETECTED Final    Comment: CRITICAL RESULT CALLED TO, READ BACK BY AND VERIFIED WITH: RN S.SNED AT 1233 ON 09/30/2020 BY T.SAAD. Performed at [facility], [place] [address], [place], [place] [address]   Culture, blood (routine x 2)     Status: None   Collection Time: 09/29/20  1:14 PM   Specimen: BLOOD RIGHT ARM  Result Value Ref Range Status   Specimen Description BLOOD RIGHT ARM  Final   Special Requests   Final    BOTTLES DRAWN AEROBIC AND ANAEROBIC Blood Culture adequate volume   Culture   Final    NO GROWTH 5 DAYS Performed at [facility], [place] [name] [place], [place] [address]    Report Status 10/04/2020 FINAL  Final  Culture, blood (routine x 2)     Status: None (Preliminary result)   Collection Time: 10/20/20  9:57 AM   Specimen: BLOOD RIGHT HAND  Result Value Ref Range Status    Specimen Description BLOOD RIGHT HAND  Final   Special Requests   Final    BOTTLES DRAWN AEROBIC ONLY Blood Culture adequate volume   Culture   Final    NO GROWTH < 24 HOURS Performed at [facility], [place] [address], [place], [place] [address]    Report Status PENDING  Incomplete  Culture, blood (routine x 2)     Status: None (Preliminary result)   Collection Time: 10/20/20  9:57 AM   Specimen: BLOOD RIGHT HAND  Result Value Ref Range Status   Specimen Description BLOOD RIGHT HAND  Final   Special Requests   Final    BOTTLES DRAWN AEROBIC ONLY Blood Culture adequate volume   Culture   Final    NO GROWTH < 24 HOURS Performed at [facility], [place] [address] [place],  [address]    Report Status PENDING  Incomplete    Coagulation Studies: No results for input(s): LABPROT, INR in the last 72 hours.   Urinalysis: No results for input(s): COLORURINE, LABSPEC, [place],  GLUCOSEU, HGBUR, BILIRUBINUR, KETONESUR, PROTEINUR, UROBILINOGEN, NITRITE, LEUKOCYTESUR in the last 72 hours.  Invalid input(s): APPERANCEUR    Imaging: DG Abd Portable 1V  Result Date: 10/20/2020 CLINICAL DATA:  Ileus EXAM: PORTABLE ABDOMEN - 1 VIEW COMPARISON:  10/16/2020 FINDINGS: Nonobstructive bowel gas pattern. Percutaneous gastrostomy. Visualized osseous structures are within normal limits. IMPRESSION: Negative. Electronically Signed   By: Julian Hy M.D.   On: 10/20/2020 00:35     Medications:       Assessment/ Plan:  45 y.o. male with a PMHx of ESRD on HD, posterior reversible encephalopathy syndrome, diabetes mellitus type 2, chronic systolic heart failure, anemia of chronic kidney disease, secondary hyperparathyroidism, malignant hypertension, acute respiratory failure, who was admitted to [facility] on 06/29/2020 for ongoing treatment of acute respiratory failure, posterior reversible encephalopathy syndrome, and end-stage renal disease.   1.  ESRD on HD.  Patient  due for hemodialysis treatment today.  Serum sodium noted to be low.  Serum sodium set at 140 on the machine.  Advised to stop it discontinue free water flushes.  2.  Anemia of chronic kidney disease.  Hemoglobin currently 7.8.  Continue to monitor blood counts closely.  Maintain the patient on Retacrit 6070 with dialysis.  3.  Secondary hyperparathyroidism.  Phosphorus acceptable at 4.2.  4.  Acute respiratory failure.  Tracheostomy placed 07/17/2020.  Currently requiring respiratory support with T-piece.  5.  Hypertension.  Blood pressure higher today at 167/97.  Patient be maintained on amlodipine, clonidine, hydralazine, labetalol, and losartan.   Roxanna Mcever 10/19/20228:20 AM

## 2020-10-22 LAB — BASIC METABOLIC PANEL
Anion gap: 8 (ref 5–15)
BUN: 25 mg/dL — ABNORMAL HIGH (ref 6–20)
CO2: 26 mmol/L (ref 22–32)
Calcium: 8.1 mg/dL — ABNORMAL LOW (ref 8.9–10.3)
Chloride: 96 mmol/L — ABNORMAL LOW (ref 98–111)
Creatinine, Ser: 2.03 mg/dL — ABNORMAL HIGH (ref 0.61–1.24)
GFR, Estimated: 40 mL/min — ABNORMAL LOW (ref >60)
Glucose, Bld: 122 mg/dL — ABNORMAL HIGH (ref 70–99)
Potassium: 3.7 mmol/L (ref 3.5–5.1)
Sodium: 130 mmol/L — ABNORMAL LOW (ref 135–145)

## 2020-10-23 LAB — RENAL FUNCTION PANEL
Albumin: <1.5 g/dL — ABNORMAL LOW (ref 3.5–5.0)
Anion gap: 6 (ref 5–15)
BUN: 32 mg/dL — ABNORMAL HIGH (ref 6–20)
CO2: 27 mmol/L (ref 22–32)
Calcium: 8.2 mg/dL — ABNORMAL LOW (ref 8.9–10.3)
Chloride: 96 mmol/L — ABNORMAL LOW (ref 98–111)
Creatinine, Ser: 2.47 mg/dL — ABNORMAL HIGH (ref 0.61–1.24)
GFR, Estimated: 32 mL/min — ABNORMAL LOW (ref >60)
Glucose, Bld: 181 mg/dL — ABNORMAL HIGH (ref 70–99)
Phosphorus: 3.1 mg/dL (ref 2.5–4.6)
Potassium: 3.8 mmol/L (ref 3.5–5.1)
Sodium: 129 mmol/L — ABNORMAL LOW (ref 135–145)

## 2020-10-23 LAB — CBC
HCT: 25.9 % — ABNORMAL LOW (ref 39.0–52.0)
Hemoglobin: 7.7 g/dL — ABNORMAL LOW (ref 13.0–17.0)
MCH: 24.6 pg — ABNORMAL LOW (ref 26.0–34.0)
MCHC: 29.7 g/dL — ABNORMAL LOW (ref 30.0–36.0)
MCV: 82.7 fL (ref 80.0–100.0)
Platelets: 157 10*3/uL (ref 150–400)
RBC: 3.13 MIL/uL — ABNORMAL LOW (ref 4.22–5.81)
RDW: 16.2 % — ABNORMAL HIGH (ref 11.5–15.5)
WBC: 7 10*3/uL (ref 4.0–10.5)
nRBC: 0 % (ref 0.0–0.2)

## 2020-10-23 NOTE — Progress Notes (Signed)
[facility]  ROUNDING NOTE   Subjective:  Serum sodium has improved up to 129. Has considerable upper extremity edema now including the right upper extremity.  Objective:  Vital signs in last 24 hours:  Temperature 97.3 pulse 63 respirations 8 blood pressure 165/98  Physical Exam: General:  No acute distress  Head:  Normocephalic, atraumatic.    Eyes:  Anicteric  Neck:  Tracheostomy in place  Lungs:   Scattered rhonchi, normal effort  Heart:  S1S2 no rubs  Abdomen:   Soft, nontender, bowel sounds present  Extremities:  3+ bilateral upper extremity edema, no lower extremity edema  Neurologic:  Bilateral hand contractures, not following commands  Skin:  No acute skin rash  Access:  Left upper extremity AV fistula    Basic Metabolic Panel: Recent Labs  Lab 10/19/20 0510 10/21/20 0448 10/22/20 0440 10/23/20 0425  NA 121* 118* 130* 129*  K 3.4* 3.5 3.7 3.8  CL 87* 86* 96* 96*  CO2 24 22 26 27   GLUCOSE 109* 146* 122* 181*  BUN 49* 40* 25* 32*  CREATININE 2.76* 2.66* 2.03* 2.47*  CALCIUM 8.0* 7.8* 8.1* 8.2*  PHOS 5.0* 4.2  --  3.1     Liver Function Tests: Recent Labs  Lab 10/19/20 0510 10/21/20 0448 10/23/20 0425  ALBUMIN <1.5* <1.5* <1.5*    No results for input(s): LIPASE, AMYLASE in the last 168 hours. No results for input(s): AMMONIA in the last 168 hours.  CBC: Recent Labs  Lab 10/19/20 0510 10/20/20 0957 10/21/20 0448 10/23/20 0425  WBC 6.0 5.5 5.3 7.0  HGB 7.7* 7.8* 7.8* 7.7*  HCT 24.9* 25.2* 25.0* 25.9*  MCV 80.1 80.5 80.1 82.7  PLT 189 166 173 157     Cardiac Enzymes: No results for input(s): CKTOTAL, CKMB, CKMBINDEX, TROPONINI in the last 168 hours.  BNP: Invalid input(s): POCBNP  CBG: No results for input(s): GLUCAP in the last 168 hours.  Microbiology: Results for orders placed or performed during the hospital encounter of 07/17/20  Culture, blood (routine x 2)     Status: None   Collection Time: 07/27/20 11:17 AM    Specimen: BLOOD RIGHT ARM  Result Value Ref Range Status   Specimen Description BLOOD RIGHT ARM  Final   Special Requests   Final    BOTTLES DRAWN AEROBIC ONLY Blood Culture results may not be optimal due to an inadequate volume of blood received in culture bottles   Culture   Final    NO GROWTH 5 DAYS Performed at [facility], [place] [address] [place], [place] [address]    Report Status 08/01/2020 FINAL  Final  Culture, blood (routine x 2)     Status: None   Collection Time: 07/27/20 11:23 AM   Specimen: BLOOD RIGHT HAND  Result Value Ref Range Status   Specimen Description BLOOD RIGHT HAND  Final   Special Requests   Final    BOTTLES DRAWN AEROBIC ONLY Blood Culture results may not be optimal due to an inadequate volume of blood received in culture bottles   Culture   Final    NO GROWTH 5 DAYS Performed at [facility], [place] [address], [place],  [address]    Report Status 08/01/2020 FINAL  Final  Culture, blood (routine x 2)     Status: None   Collection Time: 08/01/20  2:25 PM   Specimen: BLOOD RIGHT HAND  Result Value Ref Range Status   Specimen Description BLOOD RIGHT HAND  Final   Special Requests   Final    BOTTLES DRAWN AEROBIC AND ANAEROBIC Blood Culture results may not be optimal due to an inadequate volume of blood received in culture bottles   Culture   Final    NO GROWTH 5 DAYS Performed at [facility], [place] [address] [place], [place] [address]    Report Status 08/06/2020 FINAL  Final  Culture, blood (routine x 2)     Status: None   Collection Time: 08/01/20  2:39 PM   Specimen: BLOOD RIGHT ARM  Result Value Ref Range Status   Specimen Description BLOOD RIGHT ARM  Final   Special Requests   Final    BOTTLES DRAWN AEROBIC ONLY Blood Culture results may not be optimal due to an inadequate volume of blood received in culture bottles   Culture   Final    NO GROWTH 5 DAYS Performed at [facility], [place] [address],  [place], [place] [address]    Report Status 08/06/2020 FINAL  Final  Culture, Respiratory w Gram Stain     Status: None   Collection Time: 08/01/20  2:55 PM   Specimen: Tracheal Aspirate; Respiratory  Result Value Ref Range Status   Specimen Description TRACHEAL ASPIRATE  Final   Special Requests NONE  Final   Gram Stain   Final    FEW WBC PRESENT, PREDOMINANTLY PMN MODERATE GRAM NEGATIVE RODS Performed at [facility], [place] [address], [place], [place] [address]    Culture ABUNDANT PSEUDOMONAS AERUGINOSA  Final   Report Status 08/04/2020 FINAL  Final   Organism ID, Bacteria PSEUDOMONAS AERUGINOSA  Final      Susceptibility   Pseudomonas aeruginosa - MIC*    CEFTAZIDIME <=1 SENSITIVE Sensitive     CIPROFLOXACIN <=0.25 SENSITIVE Sensitive     GENTAMICIN 8 INTERMEDIATE Intermediate     IMIPENEM >=16 RESISTANT Resistant     PIP/TAZO <=4 SENSITIVE Sensitive     CEFEPIME 2 SENSITIVE Sensitive     * ABUNDANT PSEUDOMONAS AERUGINOSA  Culture, Respiratory w Gram Stain     Status: None   Collection Time: 09/17/20  5:21 PM   Specimen: Tracheal Aspirate; Respiratory  Result Value Ref Range Status   Specimen Description TRACHEAL ASPIRATE  Final   Special Requests NONE  Final   Gram Stain   Final    ABUNDANT WBC PRESENT,BOTH PMN AND MONONUCLEAR MODERATE SQUAMOUS EPITHELIAL CELLS PRESENT MODERATE GRAM POSITIVE COCCI MODERATE GRAM NEGATIVE RODS Performed at [facility], [place] [address], [place], [place] [address]    Culture FEW PSEUDOMONAS AERUGINOSA  Final   Report Status 09/22/2020 FINAL  Final   Organism ID, Bacteria PSEUDOMONAS AERUGINOSA  Final      Susceptibility   Pseudomonas aeruginosa - MIC*    CEFTAZIDIME 2 SENSITIVE Sensitive     CIPROFLOXACIN 1 SENSITIVE Sensitive     GENTAMICIN >=16 RESISTANT Resistant     IMIPENEM >=16 RESISTANT Resistant     PIP/TAZO <=4 SENSITIVE Sensitive     * FEW PSEUDOMONAS AERUGINOSA  Culture, blood (routine x 2)     Status: None    Collection Time: 09/18/20 12:05 PM   Specimen: BLOOD RIGHT ARM  Result Value Ref Range Status   Specimen Description BLOOD RIGHT ARM  Final   Special Requests   Final    BOTTLES DRAWN AEROBIC AND ANAEROBIC Blood Culture adequate volume   Culture   Final    NO GROWTH 5 DAYS Performed at [facility], [place] [address], [place], [place] [address]    Report Status 09/23/2020 FINAL  Final  Culture, blood (routine x 2)     Status: None   Collection Time: 09/18/20 12:05 PM   Specimen: BLOOD RIGHT HAND  Result Value Ref Range Status   Specimen Description BLOOD RIGHT HAND  Final   Special Requests   Final    BOTTLES DRAWN AEROBIC AND ANAEROBIC Blood Culture adequate volume   Culture   Final    NO GROWTH 5 DAYS Performed at [facility], [place] [address] [place], [place] [address]    Report Status 09/23/2020 FINAL  Final  Culture, Respiratory w Gram Stain     Status: None   Collection Time: 09/29/20 12:06 PM   Specimen: Tracheal Aspirate; Respiratory  Result Value Ref Range Status   Specimen Description TRACHEAL ASPIRATE  Final   Special Requests NONE  Final   Gram Stain   Final    RARE SQUAMOUS EPITHELIAL CELLS PRESENT ABUNDANT WBC PRESENT,BOTH PMN AND MONONUCLEAR ABUNDANT GRAM NEGATIVE RODS    Culture   Final    FEW PSEUDOMONAS AERUGINOSA MULTI-DRUG RESISTANT ORGANISM CRITICAL RESULT CALLED TO, READ BACK BY AND VERIFIED WITH: [place] RN @1145  10/03/20 EB Performed at [facility], [place] [address] [place], [place] [address]    Report Status 10/03/2020 FINAL  Final   Organism ID, Bacteria PSEUDOMONAS AERUGINOSA  Final      Susceptibility   Pseudomonas aeruginosa - MIC*    CEFTAZIDIME 16 INTERMEDIATE Intermediate     CIPROFLOXACIN 0.5 SENSITIVE Sensitive     GENTAMICIN >=16 RESISTANT Resistant     IMIPENEM >=16 RESISTANT Resistant     * FEW PSEUDOMONAS AERUGINOSA  Culture, blood (routine x 2)     Status: Abnormal   Collection Time: 09/29/20  1:07 PM   Specimen:  BLOOD RIGHT ARM  Result Value Ref Range Status   Specimen Description BLOOD RIGHT ARM  Final   Special Requests   Final    BOTTLES DRAWN AEROBIC AND ANAEROBIC Blood Culture adequate volume   Culture  Setup Time   Final    GRAM POSITIVE COCCI IN CHAINS AEROBIC BOTTLE ONLY CRITICAL RESULT CALLED TO, READ BACK BY AND VERIFIED WITH: RN S.SNED AT 1233 ON 09/30/2020 BY T.SAAD. Performed at [facility], [place] [address], [place], [place] [address]    Culture VANCOMYCIN RESISTANT ENTEROCOCCUS (A)  Final   Report Status 10/02/2020 FINAL  Final   Organism ID, Bacteria VANCOMYCIN RESISTANT ENTEROCOCCUS  Final      Susceptibility   Vancomycin resistant enterococcus - MIC*    AMPICILLIN <=2 SENSITIVE Sensitive     VANCOMYCIN >=32 RESISTANT Resistant     GENTAMICIN SYNERGY SENSITIVE Sensitive     LINEZOLID 2 SENSITIVE Sensitive     * VANCOMYCIN RESISTANT ENTEROCOCCUS  Blood Culture ID Panel (Reflexed)     Status: Abnormal   Collection Time: 09/29/20  1:07 PM  Result Value Ref Range Status   Enterococcus faecalis DETECTED (A) NOT DETECTED Final    Comment: CRITICAL RESULT CALLED TO, READ BACK BY AND VERIFIED WITH: RN S.SNED AT 1233 ON 09/30/2020 BY T.SAAD.    Enterococcus Faecium NOT DETECTED NOT DETECTED Final   Listeria monocytogenes NOT DETECTED NOT DETECTED Final   Staphylococcus species NOT DETECTED NOT DETECTED Final   Staphylococcus aureus (BCID) NOT DETECTED NOT DETECTED Final   Staphylococcus epidermidis NOT DETECTED NOT DETECTED Final   Staphylococcus lugdunensis NOT DETECTED NOT  DETECTED Final   Streptococcus species NOT DETECTED NOT DETECTED Final   Streptococcus agalactiae NOT DETECTED NOT DETECTED Final   Streptococcus pneumoniae NOT DETECTED NOT DETECTED Final   Streptococcus pyogenes NOT DETECTED NOT DETECTED Final   A.calcoaceticus-baumannii NOT DETECTED NOT DETECTED Final   Bacteroides fragilis NOT DETECTED NOT DETECTED Final   Enterobacterales NOT DETECTED NOT  DETECTED Final   Enterobacter cloacae complex NOT DETECTED NOT DETECTED Final   Escherichia coli NOT DETECTED NOT DETECTED Final   Klebsiella aerogenes NOT DETECTED NOT DETECTED Final   Klebsiella oxytoca NOT DETECTED NOT DETECTED Final   Klebsiella pneumoniae NOT DETECTED NOT DETECTED Final   Proteus species NOT DETECTED NOT DETECTED Final   Salmonella species NOT DETECTED NOT DETECTED Final   Serratia marcescens NOT DETECTED NOT DETECTED Final   Haemophilus influenzae NOT DETECTED NOT DETECTED Final   Neisseria meningitidis NOT DETECTED NOT DETECTED Final   Pseudomonas aeruginosa NOT DETECTED NOT DETECTED Final   Stenotrophomonas maltophilia NOT DETECTED NOT DETECTED Final   Candida albicans NOT DETECTED NOT DETECTED Final   Candida auris NOT DETECTED NOT DETECTED Final   Candida glabrata NOT DETECTED NOT DETECTED Final   Candida krusei NOT DETECTED NOT DETECTED Final   Candida parapsilosis NOT DETECTED NOT DETECTED Final   Candida tropicalis NOT DETECTED NOT DETECTED Final   Cryptococcus neoformans/gattii NOT DETECTED NOT DETECTED Final   Vancomycin resistance DETECTED (A) NOT DETECTED Final    Comment: CRITICAL RESULT CALLED TO, READ BACK BY AND VERIFIED WITH: RN S.SNED AT 1233 ON 09/30/2020 BY T.SAAD. Performed at [facility], [place] [address] [place], [place] [address]   Culture, blood (routine x 2)     Status: None   Collection Time: 09/29/20  1:14 PM   Specimen: BLOOD RIGHT ARM  Result Value Ref Range Status   Specimen Description BLOOD RIGHT ARM  Final   Special Requests   Final    BOTTLES DRAWN AEROBIC AND ANAEROBIC Blood Culture adequate volume   Culture   Final    NO GROWTH 5 DAYS Performed at [facility], [place] [address] [place], [place] [address]    Report Status 10/04/2020 FINAL  Final  Culture, blood (routine x 2)     Status: None (Preliminary result)   Collection Time: 10/20/20  9:57 AM   Specimen: BLOOD RIGHT HAND  Result Value Ref Range Status    Specimen Description BLOOD RIGHT HAND  Final   Special Requests   Final    BOTTLES DRAWN AEROBIC ONLY Blood Culture adequate volume   Culture   Final    NO GROWTH 2 DAYS Performed at [facility], [place] [address], [place], [place] [address]    Report Status PENDING  Incomplete  Culture, blood (routine x 2)     Status: None (Preliminary result)   Collection Time: 10/20/20  9:57 AM   Specimen: BLOOD RIGHT HAND  Result Value Ref Range Status   Specimen Description BLOOD RIGHT HAND  Final   Special Requests   Final    BOTTLES DRAWN AEROBIC ONLY Blood Culture adequate volume   Culture   Final    NO GROWTH 2 DAYS Performed at [facility], [place] [address] [place], [place] [address]    Report Status PENDING  Incomplete    Coagulation Studies: No results for input(s): LABPROT, INR in the last 72 hours.   Urinalysis: No results for input(s): COLORURINE, LABSPEC, PHURINE, GLUCOSEU, HGBUR, BILIRUBINUR, KETONESUR, PROTEINUR, UROBILINOGEN, NITRITE,  LEUKOCYTESUR in the last 72 hours.  Invalid input(s): APPERANCEUR    Imaging: No results found.   Medications:       Assessment/ Plan:  45 y.o. male with a PMHx of ESRD on HD, posterior reversible encephalopathy syndrome, diabetes mellitus type 2, chronic systolic heart failure, anemia of chronic kidney disease, secondary hyperparathyroidism, malignant hypertension, acute respiratory failure, who was admitted to [facility] on 06/29/2020 for ongoing treatment of acute respiratory failure, posterior reversible encephalopathy syndrome, and end-stage renal disease.   1.  ESRD on HD.  Continue MWF dialysis treatment schedule.  Patient due for dialysis treatment today.  2.  Anemia of chronic kidney disease.  Hemoglobin 7.7.  Maintain the patient on Retacrit 6000 units IV with dialysis treatments.  3.  Secondary hyperparathyroidism.  Serum phosphorus acceptable at 3.1.  4.  Acute respiratory failure.  Tracheostomy placed  07/17/2020.  Currently requiring respiratory support with T-piece.  5.  Hypertension.  Discussed with nursing.  Difficulty obtaining accurate blood pressures given significant bilateral upper extremity edema.  Patient to be maintained on amlodipine, clonidine, hydralazine, labetalol, and losartan.   Keala Drum 10/21/20228:01 AM

## 2020-10-25 LAB — CULTURE, BLOOD (ROUTINE X 2)
Culture: NO GROWTH
Culture: NO GROWTH
Special Requests: ADEQUATE
Special Requests: ADEQUATE

## 2020-10-26 LAB — CBC
HCT: 30.8 % — ABNORMAL LOW (ref 39.0–52.0)
Hemoglobin: 9.2 g/dL — ABNORMAL LOW (ref 13.0–17.0)
MCH: 25 pg — ABNORMAL LOW (ref 26.0–34.0)
MCHC: 29.9 g/dL — ABNORMAL LOW (ref 30.0–36.0)
MCV: 83.7 fL (ref 80.0–100.0)
Platelets: 172 10*3/uL (ref 150–400)
RBC: 3.68 MIL/uL — ABNORMAL LOW (ref 4.22–5.81)
RDW: 16.6 % — ABNORMAL HIGH (ref 11.5–15.5)
WBC: 7 10*3/uL (ref 4.0–10.5)
nRBC: 0 % (ref 0.0–0.2)

## 2020-10-26 LAB — RENAL FUNCTION PANEL
Albumin: 1.5 g/dL — ABNORMAL LOW (ref 3.5–5.0)
Anion gap: 10 (ref 5–15)
BUN: 43 mg/dL — ABNORMAL HIGH (ref 6–20)
CO2: 26 mmol/L (ref 22–32)
Calcium: 8.6 mg/dL — ABNORMAL LOW (ref 8.9–10.3)
Chloride: 91 mmol/L — ABNORMAL LOW (ref 98–111)
Creatinine, Ser: 3.06 mg/dL — ABNORMAL HIGH (ref 0.61–1.24)
GFR, Estimated: 25 mL/min — ABNORMAL LOW (ref >60)
Glucose, Bld: 137 mg/dL — ABNORMAL HIGH (ref 70–99)
Phosphorus: 3.4 mg/dL (ref 2.5–4.6)
Potassium: 4.6 mmol/L (ref 3.5–5.1)
Sodium: 127 mmol/L — ABNORMAL LOW (ref 135–145)

## 2020-10-26 NOTE — Progress Notes (Signed)
[facility]  ROUNDING NOTE   Subjective:  Patient due for hemodialysis treatment later today. Most recent serum sodium was 129. Patient sitting up in chair at the moment. 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  Objective:  Vital signs in last 24 hours:  Temperature 97 pulse 68 respirations 24 blood pressure 175/98  Physical Exam: General:  No acute distress  Head:  Normocephalic, atraumatic.    Eyes:  Anicteric  Neck:  Tracheostomy in place  Lungs:   Scattered rhonchi, normal effort  Heart:  S1S2 no rubs  Abdomen:   Soft, nontender, bowel sounds present  Extremities:  3+ bilateral upper extremity edema, no lower extremity edema  Neurologic:  Bilateral hand contractures, not following commands  Skin:  No acute skin rash  Access:  Left upper extremity AV fistula    Basic Metabolic Panel: Recent Labs  Lab 10/21/20 0448 10/22/20 0440 10/23/20 0425  NA 118* 130* 129*  K 3.5 3.7 3.8  CL 86* 96* 96*  CO2 22 26 27   GLUCOSE 146* 122* 181*  BUN 40* 25* 32*  CREATININE 2.66* 2.03* 2.47*  CALCIUM 7.8* 8.1* 8.2*  PHOS 4.2  --  3.1     Liver Function Tests: Recent Labs  Lab 10/21/20 0448 10/23/20 0425  ALBUMIN <1.5* <1.5*    No results for input(s): LIPASE, AMYLASE in the last 168 hours. No results for input(s): AMMONIA in the last 168 hours.  CBC: Recent Labs  Lab 10/20/20 0957 10/21/20 0448 10/23/20 0425  WBC 5.5 5.3 7.0  HGB 7.8* 7.8* 7.7*  HCT 25.2* 25.0* 25.9*  MCV 80.5 80.1 82.7  PLT 166 173 157     Cardiac Enzymes: No results for input(s): CKTOTAL, CKMB, CKMBINDEX, TROPONINI in the last 168 hours.  BNP: Invalid input(s): POCBNP  CBG: No results for input(s): GLUCAP in the last 168 hours.  Microbiology: Results for orders placed or performed during the hospital encounter of 07/17/20  Culture, blood (routine x 2)     Status: None   Collection Time: 07/27/20 11:17 AM   Specimen: BLOOD RIGHT ARM  Result  Value Ref Range Status   Specimen Description BLOOD RIGHT ARM  Final   Special Requests   Final    BOTTLES DRAWN AEROBIC ONLY Blood Culture results may not be optimal due to an inadequate volume of blood received in culture bottles   Culture   Final    NO GROWTH 5 DAYS Performed at [facility], [place] [address], [place], [place] [address]    Report Status 08/01/2020 FINAL  Final  Culture, blood (routine x 2)     Status: None   Collection Time: 07/27/20 11:23 AM   Specimen: BLOOD RIGHT HAND  Result Value Ref Range Status   Specimen Description BLOOD RIGHT HAND  Final   Special Requests   Final    BOTTLES DRAWN AEROBIC ONLY Blood Culture results may not be optimal due to an inadequate volume of blood received in culture bottles   Culture   Final    NO GROWTH 5 DAYS Performed at [facility], [place] [address], [place], [place] [address]    Report Status 08/01/2020 FINAL  Final  Culture, blood (routine x 2)     Status: None   Collection Time: 08/01/20  2:25 PM   Specimen: BLOOD RIGHT HAND  Result Value Ref Range Status   Specimen Description BLOOD RIGHT HAND  Final   Special Requests   Final  BOTTLES DRAWN AEROBIC AND ANAEROBIC Blood Culture results may not be optimal due to an inadequate volume of blood received in culture bottles   Culture   Final    NO GROWTH 5 DAYS Performed at [facility], [place] [address] [place], [place] [address]    Report Status 08/06/2020 FINAL  Final  Culture, blood (routine x 2)     Status: None   Collection Time: 08/01/20  2:39 PM   Specimen: BLOOD RIGHT ARM  Result Value Ref Range Status   Specimen Description BLOOD RIGHT ARM  Final   Special Requests   Final    BOTTLES DRAWN AEROBIC ONLY Blood Culture results may not be optimal due to an inadequate volume of blood received in culture bottles   Culture   Final    NO GROWTH 5 DAYS Performed at [facility], [place] [address] [place], [place] [address]    Report Status  08/06/2020 FINAL  Final  Culture, Respiratory w Gram Stain     Status: None   Collection Time: 08/01/20  2:55 PM   Specimen: Tracheal Aspirate; Respiratory  Result Value Ref Range Status   Specimen Description TRACHEAL ASPIRATE  Final   Special Requests NONE  Final   Gram Stain   Final    FEW WBC PRESENT, PREDOMINANTLY PMN MODERATE GRAM NEGATIVE RODS Performed at [facility], [place] [address], [place], [place] [address]    Culture ABUNDANT PSEUDOMONAS AERUGINOSA  Final   Report Status 08/04/2020 FINAL  Final   Organism ID, Bacteria PSEUDOMONAS AERUGINOSA  Final      Susceptibility   Pseudomonas aeruginosa - MIC*    CEFTAZIDIME <=1 SENSITIVE Sensitive     CIPROFLOXACIN <=0.25 SENSITIVE Sensitive     GENTAMICIN 8 INTERMEDIATE Intermediate     IMIPENEM >=16 RESISTANT Resistant     PIP/TAZO <=4 SENSITIVE Sensitive     CEFEPIME 2 SENSITIVE Sensitive     * ABUNDANT PSEUDOMONAS AERUGINOSA  Culture, Respiratory w Gram Stain     Status: None   Collection Time: 09/17/20  5:21 PM   Specimen: Tracheal Aspirate; Respiratory  Result Value Ref Range Status   Specimen Description TRACHEAL ASPIRATE  Final   Special Requests NONE  Final   Gram Stain   Final    ABUNDANT WBC PRESENT,BOTH PMN AND MONONUCLEAR MODERATE SQUAMOUS EPITHELIAL CELLS PRESENT MODERATE GRAM POSITIVE COCCI MODERATE GRAM NEGATIVE RODS Performed at [facility], [place] [address], [place], [place] [address]    Culture FEW PSEUDOMONAS AERUGINOSA  Final   Report Status 09/22/2020 FINAL  Final   Organism ID, Bacteria PSEUDOMONAS AERUGINOSA  Final      Susceptibility   Pseudomonas aeruginosa - MIC*    CEFTAZIDIME 2 SENSITIVE Sensitive     CIPROFLOXACIN 1 SENSITIVE Sensitive     GENTAMICIN >=16 RESISTANT Resistant     IMIPENEM >=16 RESISTANT Resistant     PIP/TAZO <=4 SENSITIVE Sensitive     * FEW PSEUDOMONAS AERUGINOSA  Culture, blood (routine x 2)     Status: None   Collection Time: 09/18/20 12:05 PM   Specimen:  BLOOD RIGHT ARM  Result Value Ref Range Status   Specimen Description BLOOD RIGHT ARM  Final   Special Requests   Final    BOTTLES DRAWN AEROBIC AND ANAEROBIC Blood Culture adequate volume   Culture   Final    NO GROWTH 5 DAYS Performed at [facility], [address] [place], [place] [address]  Report Status 09/23/2020 FINAL  Final  Culture, blood (routine x 2)     Status: None   Collection Time: 09/18/20 12:05 PM   Specimen: BLOOD RIGHT HAND  Result Value Ref Range Status   Specimen Description BLOOD RIGHT HAND  Final   Special Requests   Final    BOTTLES DRAWN AEROBIC AND ANAEROBIC Blood Culture adequate volume   Culture   Final    NO GROWTH 5 DAYS Performed at [facility], [place] [address] [place], [place] [address]    Report Status 09/23/2020 FINAL  Final  Culture, Respiratory w Gram Stain     Status: None   Collection Time: 09/29/20 12:06 PM   Specimen: Tracheal Aspirate; Respiratory  Result Value Ref Range Status   Specimen Description TRACHEAL ASPIRATE  Final   Special Requests NONE  Final   Gram Stain   Final    RARE SQUAMOUS EPITHELIAL CELLS PRESENT ABUNDANT WBC PRESENT,BOTH PMN AND MONONUCLEAR ABUNDANT GRAM NEGATIVE RODS    Culture   Final    FEW PSEUDOMONAS AERUGINOSA MULTI-DRUG RESISTANT ORGANISM CRITICAL RESULT CALLED TO, READ BACK BY AND VERIFIED WITH: [place] RN @1145  10/03/20 EB Performed at [facility], [place] [address], [place], [place] [address]    Report Status 10/03/2020 FINAL  Final   Organism ID, Bacteria PSEUDOMONAS AERUGINOSA  Final      Susceptibility   Pseudomonas aeruginosa - MIC*    CEFTAZIDIME 16 INTERMEDIATE Intermediate     CIPROFLOXACIN 0.5 SENSITIVE Sensitive     GENTAMICIN >=16 RESISTANT Resistant     IMIPENEM >=16 RESISTANT Resistant     * FEW PSEUDOMONAS AERUGINOSA  Culture, blood (routine x 2)     Status: Abnormal   Collection Time: 09/29/20  1:07 PM   Specimen: BLOOD RIGHT ARM  Result Value Ref Range Status    Specimen Description BLOOD RIGHT ARM  Final   Special Requests   Final    BOTTLES DRAWN AEROBIC AND ANAEROBIC Blood Culture adequate volume   Culture  Setup Time   Final    GRAM POSITIVE COCCI IN CHAINS AEROBIC BOTTLE ONLY CRITICAL RESULT CALLED TO, READ BACK BY AND VERIFIED WITH: RN S.SNED AT 1233 ON 09/30/2020 BY T.SAAD. Performed at [facility], [place] [address], [place], [place] [address]    Culture VANCOMYCIN RESISTANT ENTEROCOCCUS (A)  Final   Report Status 10/02/2020 FINAL  Final   Organism ID, Bacteria VANCOMYCIN RESISTANT ENTEROCOCCUS  Final      Susceptibility   Vancomycin resistant enterococcus - MIC*    AMPICILLIN <=2 SENSITIVE Sensitive     VANCOMYCIN >=32 RESISTANT Resistant     GENTAMICIN SYNERGY SENSITIVE Sensitive     LINEZOLID 2 SENSITIVE Sensitive     * VANCOMYCIN RESISTANT ENTEROCOCCUS  Blood Culture ID Panel (Reflexed)     Status: Abnormal   Collection Time: 09/29/20  1:07 PM  Result Value Ref Range Status   Enterococcus faecalis DETECTED (A) NOT DETECTED Final    Comment: CRITICAL RESULT CALLED TO, READ BACK BY AND VERIFIED WITH: RN S.SNED AT 1233 ON 09/30/2020 BY T.SAAD.    Enterococcus Faecium NOT DETECTED NOT DETECTED Final   Listeria monocytogenes NOT DETECTED NOT DETECTED Final   Staphylococcus species NOT DETECTED NOT DETECTED Final   Staphylococcus aureus (BCID) NOT DETECTED NOT DETECTED Final   Staphylococcus epidermidis NOT DETECTED NOT DETECTED Final   Staphylococcus lugdunensis NOT DETECTED NOT DETECTED Final   Streptococcus species NOT DETECTED NOT DETECTED Final  Streptococcus agalactiae NOT DETECTED NOT DETECTED Final   Streptococcus pneumoniae NOT DETECTED NOT DETECTED Final   Streptococcus pyogenes NOT DETECTED NOT DETECTED Final   A.calcoaceticus-baumannii NOT DETECTED NOT DETECTED Final   Bacteroides fragilis NOT DETECTED NOT DETECTED Final   Enterobacterales NOT DETECTED NOT DETECTED Final   Enterobacter cloacae complex NOT  DETECTED NOT DETECTED Final   Escherichia coli NOT DETECTED NOT DETECTED Final   Klebsiella aerogenes NOT DETECTED NOT DETECTED Final   Klebsiella oxytoca NOT DETECTED NOT DETECTED Final   Klebsiella pneumoniae NOT DETECTED NOT DETECTED Final   Proteus species NOT DETECTED NOT DETECTED Final   Salmonella species NOT DETECTED NOT DETECTED Final   Serratia marcescens NOT DETECTED NOT DETECTED Final   Haemophilus influenzae NOT DETECTED NOT DETECTED Final   Neisseria meningitidis NOT DETECTED NOT DETECTED Final   Pseudomonas aeruginosa NOT DETECTED NOT DETECTED Final   Stenotrophomonas maltophilia NOT DETECTED NOT DETECTED Final   Candida albicans NOT DETECTED NOT DETECTED Final   Candida auris NOT DETECTED NOT DETECTED Final   Candida glabrata NOT DETECTED NOT DETECTED Final   Candida krusei NOT DETECTED NOT DETECTED Final   Candida parapsilosis NOT DETECTED NOT DETECTED Final   Candida tropicalis NOT DETECTED NOT DETECTED Final   Cryptococcus neoformans/gattii NOT DETECTED NOT DETECTED Final   Vancomycin resistance DETECTED (A) NOT DETECTED Final    Comment: CRITICAL RESULT CALLED TO, READ BACK BY AND VERIFIED WITH: RN S.SNED AT 1233 ON 09/30/2020 BY T.SAAD. Performed at [facility], [place] [address], [place], [place] [address]   Culture, blood (routine x 2)     Status: None   Collection Time: 09/29/20  1:14 PM   Specimen: BLOOD RIGHT ARM  Result Value Ref Range Status   Specimen Description BLOOD RIGHT ARM  Final   Special Requests   Final    BOTTLES DRAWN AEROBIC AND ANAEROBIC Blood Culture adequate volume   Culture   Final    NO GROWTH 5 DAYS Performed at [facility], [place] [address] [place], [place] [address]    Report Status 10/04/2020 FINAL  Final  Culture, blood (routine x 2)     Status: None   Collection Time: 10/20/20  9:57 AM   Specimen: BLOOD RIGHT HAND  Result Value Ref Range Status   Specimen Description BLOOD RIGHT HAND  Final   Special Requests    Final    BOTTLES DRAWN AEROBIC ONLY Blood Culture adequate volume   Culture   Final    NO GROWTH 5 DAYS Performed at [facility], [place] [address], [place], [place] [address]    Report Status 10/25/2020 FINAL  Final  Culture, blood (routine x 2)     Status: None   Collection Time: 10/20/20  9:57 AM   Specimen: BLOOD RIGHT HAND  Result Value Ref Range Status   Specimen Description BLOOD RIGHT HAND  Final   Special Requests   Final    BOTTLES DRAWN AEROBIC ONLY Blood Culture adequate volume   Culture   Final    NO GROWTH 5 DAYS Performed at [facility], [place] [address] [place],  [address]    Report Status 10/25/2020 FINAL  Final    Coagulation Studies: No results for input(s): LABPROT, INR in the last 72 hours.   Urinalysis: No results for input(s): COLORURINE, LABSPEC, PHURINE, GLUCOSEU, HGBUR, BILIRUBINUR, KETONESUR, PROTEINUR, UROBILINOGEN, NITRITE, LEUKOCYTESUR in the last 72 hours.  Invalid input(s): APPERANCEUR    Imaging: No  results found.   Medications:       Assessment/ Plan:  45 y.o. male with a PMHx of ESRD on HD, posterior reversible encephalopathy syndrome, diabetes mellitus type 2, chronic systolic heart failure, anemia of chronic kidney disease, secondary hyperparathyroidism, malignant hypertension, acute respiratory failure, who was admitted to [facility] on 06/29/2020 for ongoing treatment of acute respiratory failure, posterior reversible encephalopathy syndrome, and end-stage renal disease.   1.  ESRD on HD.  Patient due for hemodialysis treatment today.  Maintain the patient on MWF dialysis schedule.  2.  Anemia of chronic kidney disease.  Repeat hemoglobin today.  Most recent hemoglobin was 7.7.  Maintain the patient on Retacrit 6000 units with dialysis treatments.  3.  Secondary hyperparathyroidism.  Repeat serum phosphorus today.  4.  Acute respiratory failure.  Tracheostomy placed 07/17/2020.  Respiratory status appears to  be stable.  5.  Hypertension.  Swelling in both arms is preventing accurate blood pressure measurements.  Patient to be maintained on amlodipine, clonidine, hydralazine, labetalol, and losartan.   Jeffery Andrews 10/24/20227:50 AM

## 2020-10-27 NOTE — Consult Note (Addendum)
Referring Physician: Saul Fordyce, MD/A Laren Everts, MD  Jeffery Andrews is an 45 y.o. male.                       Chief Complaint: Uncontrolled hypertension  HPI: 45 years old black male with PMH of acute on chronic respiratory failure, ESRD, anemia of chronic disease, CHF, HTN, Type 2 DM and altered mental status has uncontrolled hypertension.   Past medical history: As per HPI  PSH: S/P AV fistula          S/P tracheostomy  No family history on file. Social History:  has no history on file for tobacco use, alcohol use, and drug use.  Allergies: Not on File  No medications prior to admission.  See Medication lest on chart.  Results for orders placed or performed during the hospital encounter of 07/17/20 (from the past 48 hour(s))  Renal function panel     Status: Abnormal   Collection Time: 10/26/20  8:53 AM  Result Value Ref Range   Sodium 127 (L) 135 - 145 mmol/L   Potassium 4.6 3.5 - 5.1 mmol/L   Chloride 91 (L) 98 - 111 mmol/L   CO2 26 22 - 32 mmol/L   Glucose, Bld 137 (H) 70 - 99 mg/dL    Comment: Glucose reference range applies only to samples taken after fasting for at least 8 hours.   BUN 43 (H) 6 - 20 mg/dL   Creatinine, Ser 3.06 (H) 0.61 - 1.24 mg/dL   Calcium 8.6 (L) 8.9 - 10.3 mg/dL   Phosphorus 3.4 2.5 - 4.6 mg/dL   Albumin 1.5 (L) 3.5 - 5.0 g/dL   GFR, Estimated 25 (L) >60 mL/min    Comment: (NOTE) Calculated using the CKD-EPI Creatinine Equation (2021)    Anion gap 10 5 - 15    Comment: Performed at [facility] [address] [place], [place] [address]  CBC     Status: Abnormal   Collection Time: 10/26/20  8:53 AM  Result Value Ref Range   WBC 7.0 4.0 - 10.5 K/uL   RBC 3.68 (L) 4.22 - 5.81 MIL/uL   Hemoglobin 9.2 (L) 13.0 - 17.0 g/dL   HCT 30.8 (L) 39.0 - 52.0 %   MCV 83.7 80.0 - 100.0 fL   MCH 25.0 (L) 26.0 - 34.0 pg   MCHC 29.9 (L) 30.0 - 36.0 g/dL   RDW 16.6 (H) 11.5 - 15.5 %   Platelets 172 150 - 400 K/uL   nRBC 0.0 0.0 - 0.2 %    Comment:  Performed at [facility], [place] [address], [place], [place] [address]   No results found.  Review Of Systems Unable to obtain.   Blood pressure (!) 175/98, pulse 97, resp. rate 24, SpO2 98 %. There is no height or weight on file to calculate BMI. General appearance: alert, cooperative, appears stated age and no distress Head: Normocephalic, atraumatic. Eyes: Brown eyes, pale pink conjunctiva, corneas clear.  Neck: No adenopathy, no carotid bruit, no JVD, supple, symmetrical, tracheostomy in place. Resp: Scattered rhonchi to auscultation bilaterally. Cardio: Regular rate and rhythm, S1, S2 normal, II/VI systolic murmur, no click, rub or gallop GI: Soft, non-tender. Extremities: No lower extremities edema, cyanosis or clubbing. 2 + bilateral upper extremities edema. Skin: Warm and dry.  Neurologic: Alert and oriented X 0, bilateral hand contractures.  Assessment/Plan Uncontrolled hypertension Acute on chronic respiratory failure with hypoxia ESRD on HD Anemia of chronic disease  S/P tracheostomy  Plan: Add small dose metoprolol and increase dose as tolerated along with amlodipine, clonidine, hydralazine and losartan.  Time spent: Review of old records, Lab, x-rays, EKG, other cardiac tests, examination, discussion with patient/Tech/Nurse over 70 minutes.  Birdie Riddle, MD  10/27/2020, 6:08 PM

## 2020-10-27 NOTE — Consult Note (Signed)
Ref: Jacqualine Code, DO   Subjective:  Not following commands but appears to be watching TV.  Objective:  Vital Signs in the last 24 hours:  P: 90, R: 28, O 2 sat 98 %, BP : 170/90  Physical Exam: BP Readings from Last 1 Encounters:  07/28/20 (!) 168/82     Wt Readings from Last 1 Encounters:  No data found for Wt    Weight change:  There is no height or weight on file to calculate BMI. HEENT: [place]/AT, Eyes-Brown, Conjunctiva-Pale pink, Sclera-Non-icteric Neck: No JVD, No bruit, Tracheostomy present. Lungs:  Scattered rhonchi, Bilateral. Cardiac:  Regular rhythm, normal S1 and S2, no S3. II/VI systolic murmur. Abdomen:  Soft, non-tender. BS present. Extremities:  2 + UE edema present. No cyanosis. No clubbing. CNS: AxOx0.  Skin: Warm and dry.   Intake/Output from previous day: No intake/output data recorded.    Lab Results: BMET    Component Value Date/Time   NA 127 (L) 10/26/2020 0853   NA 129 (L) 10/23/2020 0425   NA 130 (L) 10/22/2020 0440   K 4.6 10/26/2020 0853   K 3.8 10/23/2020 0425   K 3.7 10/22/2020 0440   CL 91 (L) 10/26/2020 0853   CL 96 (L) 10/23/2020 0425   CL 96 (L) 10/22/2020 0440   CO2 26 10/26/2020 0853   CO2 27 10/23/2020 0425   CO2 26 10/22/2020 0440   GLUCOSE 137 (H) 10/26/2020 0853   GLUCOSE 181 (H) 10/23/2020 0425   GLUCOSE 122 (H) 10/22/2020 0440   BUN 43 (H) 10/26/2020 0853   BUN 32 (H) 10/23/2020 0425   BUN 25 (H) 10/22/2020 0440   CREATININE 3.06 (H) 10/26/2020 0853   CREATININE 2.47 (H) 10/23/2020 0425   CREATININE 2.03 (H) 10/22/2020 0440   CALCIUM 8.6 (L) 10/26/2020 0853   CALCIUM 8.2 (L) 10/23/2020 0425   CALCIUM 8.1 (L) 10/22/2020 0440   GFRNONAA 25 (L) 10/26/2020 0853   GFRNONAA 32 (L) 10/23/2020 0425   GFRNONAA 40 (L) 10/22/2020 0440   CBC    Component Value Date/Time   WBC 7.0 10/26/2020 0853   RBC 3.68 (L) 10/26/2020 0853   HGB 9.2 (L) 10/26/2020 0853   HCT 30.8 (L) 10/26/2020 0853   PLT 172 10/26/2020 0853    MCV 83.7 10/26/2020 0853   MCH 25.0 (L) 10/26/2020 0853   MCHC 29.9 (L) 10/26/2020 0853   RDW 16.6 (H) 10/26/2020 0853   LYMPHSABS 1.4 09/21/2020 1052   MONOABS 0.6 09/21/2020 1052   EOSABS 0.3 09/21/2020 1052   BASOSABS 0.1 09/21/2020 1052   HEPATIC Function Panel Recent Labs    06/29/20 2137  PROT 6.0*   HEMOGLOBIN A1C No components found for: HGA1C,  MPG CARDIAC ENZYMES No results found for: CKTOTAL, CKMB, CKMBINDEX, TROPONINI BNP No results for input(s): PROBNP in the last 8760 hours. TSH Recent Labs    06/29/20 2137  TSH 5.995*   CHOLESTEROL No results for input(s): CHOL in the last 8760 hours.  Scheduled Meds: Continuous Infusions: PRN Meds:.  Assessment/Plan:  Hypertension, improving Acute on chronic respiratory failure with hypoxia ESRD with HD Anemia of CKD    LOS: 0 days   Time spent including chart review, lab review, examination, discussion with patient/Nurse : 30 min   Dixie Dials  MD  10/27/2020, 6:28 PM

## 2020-10-28 LAB — CBC
HCT: 27.9 % — ABNORMAL LOW (ref 39.0–52.0)
Hemoglobin: 8.3 g/dL — ABNORMAL LOW (ref 13.0–17.0)
MCH: 24.6 pg — ABNORMAL LOW (ref 26.0–34.0)
MCHC: 29.7 g/dL — ABNORMAL LOW (ref 30.0–36.0)
MCV: 82.5 fL (ref 80.0–100.0)
Platelets: 149 10*3/uL — ABNORMAL LOW (ref 150–400)
RBC: 3.38 MIL/uL — ABNORMAL LOW (ref 4.22–5.81)
RDW: 16.8 % — ABNORMAL HIGH (ref 11.5–15.5)
WBC: 7.2 10*3/uL (ref 4.0–10.5)
nRBC: 0 % (ref 0.0–0.2)

## 2020-10-28 LAB — RENAL FUNCTION PANEL
Albumin: <1.5 g/dL — ABNORMAL LOW (ref 3.5–5.0)
Anion gap: 8 (ref 5–15)
BUN: 44 mg/dL — ABNORMAL HIGH (ref 6–20)
CO2: 26 mmol/L (ref 22–32)
Calcium: 8.3 mg/dL — ABNORMAL LOW (ref 8.9–10.3)
Chloride: 96 mmol/L — ABNORMAL LOW (ref 98–111)
Creatinine, Ser: 2.87 mg/dL — ABNORMAL HIGH (ref 0.61–1.24)
GFR, Estimated: 27 mL/min — ABNORMAL LOW (ref >60)
Glucose, Bld: 131 mg/dL — ABNORMAL HIGH (ref 70–99)
Phosphorus: 3.2 mg/dL (ref 2.5–4.6)
Potassium: 4.2 mmol/L (ref 3.5–5.1)
Sodium: 130 mmol/L — ABNORMAL LOW (ref 135–145)

## 2020-10-28 NOTE — Consult Note (Signed)
Ref: Jacqualine Code, DO   Subjective:  Awake but not able to communicate.  Objective:  Vital Signs in the last 24 hours:  P: 61, R: 12, BP:186/102 before AM medications. BP: 156/91 post medications.  Physical Exam: BP Readings from Last 1 Encounters:  07/28/20 (!) 168/82     Wt Readings from Last 1 Encounters:  No data found for Wt    Weight change:  There is no height or weight on file to calculate BMI. HEENT: [place]/AT, Eyes-Brown, Conjunctiva-Pale, Sclera-Non-icteric Neck: No JVD, No bruit, Tracheostomy present. Lungs:  Clearing, Bilateral. Cardiac:  Regular rhythm, normal S1 and S2, no S3. II/VI systolic murmur. Abdomen:  Soft, non-tender. BS present. Extremities:  2 + UE edema.present. No cyanosis. No clubbing. CNS: AxOx0,  Skin: Warm and dry.   Intake/Output from previous day: No intake/output data recorded.    Lab Results: BMET    Component Value Date/Time   NA 130 (L) 10/28/2020 0503   NA 127 (L) 10/26/2020 0853   NA 129 (L) 10/23/2020 0425   K 4.2 10/28/2020 0503   K 4.6 10/26/2020 0853   K 3.8 10/23/2020 0425   CL 96 (L) 10/28/2020 0503   CL 91 (L) 10/26/2020 0853   CL 96 (L) 10/23/2020 0425   CO2 26 10/28/2020 0503   CO2 26 10/26/2020 0853   CO2 27 10/23/2020 0425   GLUCOSE 131 (H) 10/28/2020 0503   GLUCOSE 137 (H) 10/26/2020 0853   GLUCOSE 181 (H) 10/23/2020 0425   BUN 44 (H) 10/28/2020 0503   BUN 43 (H) 10/26/2020 0853   BUN 32 (H) 10/23/2020 0425   CREATININE 2.87 (H) 10/28/2020 0503   CREATININE 3.06 (H) 10/26/2020 0853   CREATININE 2.47 (H) 10/23/2020 0425   CALCIUM 8.3 (L) 10/28/2020 0503   CALCIUM 8.6 (L) 10/26/2020 0853   CALCIUM 8.2 (L) 10/23/2020 0425   GFRNONAA 27 (L) 10/28/2020 0503   GFRNONAA 25 (L) 10/26/2020 0853   GFRNONAA 32 (L) 10/23/2020 0425   CBC    Component Value Date/Time   WBC 7.2 10/28/2020 0503   RBC 3.38 (L) 10/28/2020 0503   HGB 8.3 (L) 10/28/2020 0503   HCT 27.9 (L) 10/28/2020 0503   PLT 149 (L)  10/28/2020 0503   MCV 82.5 10/28/2020 0503   MCH 24.6 (L) 10/28/2020 0503   MCHC 29.7 (L) 10/28/2020 0503   RDW 16.8 (H) 10/28/2020 0503   LYMPHSABS 1.4 09/21/2020 1052   MONOABS 0.6 09/21/2020 1052   EOSABS 0.3 09/21/2020 1052   BASOSABS 0.1 09/21/2020 1052   HEPATIC Function Panel Recent Labs    06/29/20 2137  PROT 6.0*   HEMOGLOBIN A1C No components found for: HGA1C,  MPG CARDIAC ENZYMES No results found for: CKTOTAL, CKMB, CKMBINDEX, TROPONINI BNP No results for input(s): PROBNP in the last 8760 hours. TSH Recent Labs    06/29/20 2137  TSH 5.995*   CHOLESTEROL No results for input(s): CHOL in the last 8760 hours.  Scheduled Meds: Continuous Infusions: PRN Meds:.  Assessment/Plan:  Hypertension Acute on chronic respiratory failure with hypoxia ESRD with HD Anemia of CKD  Plan: Continue medical treatment,   LOS: 0 days   Time spent including chart review, lab review, examination, discussion with patient/Nurse : 30 min   Dixie Dials  MD  10/28/2020, 12:40 PM

## 2020-10-28 NOTE — Progress Notes (Signed)
[facility]  ROUNDING NOTE   Subjective:  Patient resting in bed this AM. Remains on T-piece collar. Due for hemodialysis treatment today.  Objective:  Vital signs in last 24 hours:  Temperature 95.7 pulse 62 respirations 10 blood pressure 156/91  Physical Exam: General:  No acute distress  Head:  Normocephalic, atraumatic.    Eyes:  Anicteric  Neck:  Tracheostomy in place  Lungs:   Scattered rhonchi, normal effort  Heart:  S1S2 no rubs  Abdomen:   Soft, nontender, bowel sounds present  Extremities:  3+ bilateral upper extremity edema, no lower extremity edema  Neurologic:  Bilateral hand contractures, not following commands  Skin:  No acute skin rash  Access:  Left upper extremity AV fistula    Basic Metabolic Panel: Recent Labs  Lab 10/22/20 0440 10/23/20 0425 10/26/20 0853 10/28/20 0503  NA 130* 129* 127* 130*  K 3.7 3.8 4.6 4.2  CL 96* 96* 91* 96*  CO2 26 27 26 26   GLUCOSE 122* 181* 137* 131*  BUN 25* 32* 43* 44*  CREATININE 2.03* 2.47* 3.06* 2.87*  CALCIUM 8.1* 8.2* 8.6* 8.3*  PHOS  --  3.1 3.4 3.2     Liver Function Tests: Recent Labs  Lab 10/23/20 0425 10/26/20 0853 10/28/20 0503  ALBUMIN <1.5* 1.5* <1.5*    No results for input(s): LIPASE, AMYLASE in the last 168 hours. No results for input(s): AMMONIA in the last 168 hours.  CBC: Recent Labs  Lab 10/23/20 0425 10/26/20 0853 10/28/20 0503  WBC 7.0 7.0 7.2  HGB 7.7* 9.2* 8.3*  HCT 25.9* 30.8* 27.9*  MCV 82.7 83.7 82.5  PLT 157 172 149*     Cardiac Enzymes: No results for input(s): CKTOTAL, CKMB, CKMBINDEX, TROPONINI in the last 168 hours.  BNP: Invalid input(s): POCBNP  CBG: No results for input(s): GLUCAP in the last 168 hours.  Microbiology: Results for orders placed or performed during the hospital encounter of 07/17/20  Culture, blood (routine x 2)     Status: None   Collection Time: 07/27/20 11:17 AM   Specimen: BLOOD RIGHT ARM  Result Value Ref Range  Status   Specimen Description BLOOD RIGHT ARM  Final   Special Requests   Final    BOTTLES DRAWN AEROBIC ONLY Blood Culture results may not be optimal due to an inadequate volume of blood received in culture bottles   Culture   Final    NO GROWTH 5 DAYS Performed at [facility], [place] [address], [place], [place] [address]    Report Status 08/01/2020 FINAL  Final  Culture, blood (routine x 2)     Status: None   Collection Time: 07/27/20 11:23 AM   Specimen: BLOOD RIGHT HAND  Result Value Ref Range Status   Specimen Description BLOOD RIGHT HAND  Final   Special Requests   Final    BOTTLES DRAWN AEROBIC ONLY Blood Culture results may not be optimal due to an inadequate volume of blood received in culture bottles   Culture   Final    NO GROWTH 5 DAYS Performed at [facility], [place] [address] [place],  [address]    Report Status 08/01/2020 FINAL  Final  Culture, blood (routine x 2)     Status: None   Collection Time: 08/01/20  2:25 PM   Specimen: BLOOD RIGHT HAND  Result Value Ref Range Status   Specimen Description BLOOD RIGHT HAND  Final   Special Requests   Final  BOTTLES DRAWN AEROBIC AND ANAEROBIC Blood Culture results may not be optimal due to an inadequate volume of blood received in culture bottles   Culture   Final    NO GROWTH 5 DAYS Performed at [facility], [place] [address], [place], [place] [address]    Report Status 08/06/2020 FINAL  Final  Culture, blood (routine x 2)     Status: None   Collection Time: 08/01/20  2:39 PM   Specimen: BLOOD RIGHT ARM  Result Value Ref Range Status   Specimen Description BLOOD RIGHT ARM  Final   Special Requests   Final    BOTTLES DRAWN AEROBIC ONLY Blood Culture results may not be optimal due to an inadequate volume of blood received in culture bottles   Culture   Final    NO GROWTH 5 DAYS Performed at [facility], [place] [address] [place], [place] [address]    Report Status 08/06/2020 FINAL  Final   Culture, Respiratory w Gram Stain     Status: None   Collection Time: 08/01/20  2:55 PM   Specimen: Tracheal Aspirate; Respiratory  Result Value Ref Range Status   Specimen Description TRACHEAL ASPIRATE  Final   Special Requests NONE  Final   Gram Stain   Final    FEW WBC PRESENT, PREDOMINANTLY PMN MODERATE GRAM NEGATIVE RODS Performed at [facility], [place] [address], [place], [place] [address]    Culture ABUNDANT PSEUDOMONAS AERUGINOSA  Final   Report Status 08/04/2020 FINAL  Final   Organism ID, Bacteria PSEUDOMONAS AERUGINOSA  Final      Susceptibility   Pseudomonas aeruginosa - MIC*    CEFTAZIDIME <=1 SENSITIVE Sensitive     CIPROFLOXACIN <=0.25 SENSITIVE Sensitive     GENTAMICIN 8 INTERMEDIATE Intermediate     IMIPENEM >=16 RESISTANT Resistant     PIP/TAZO <=4 SENSITIVE Sensitive     CEFEPIME 2 SENSITIVE Sensitive     * ABUNDANT PSEUDOMONAS AERUGINOSA  Culture, Respiratory w Gram Stain     Status: None   Collection Time: 09/17/20  5:21 PM   Specimen: Tracheal Aspirate; Respiratory  Result Value Ref Range Status   Specimen Description TRACHEAL ASPIRATE  Final   Special Requests NONE  Final   Gram Stain   Final    ABUNDANT WBC PRESENT,BOTH PMN AND MONONUCLEAR MODERATE SQUAMOUS EPITHELIAL CELLS PRESENT MODERATE GRAM POSITIVE COCCI MODERATE GRAM NEGATIVE RODS Performed at [facility], [place] [address], [place], [place] [address]    Culture FEW PSEUDOMONAS AERUGINOSA  Final   Report Status 09/22/2020 FINAL  Final   Organism ID, Bacteria PSEUDOMONAS AERUGINOSA  Final      Susceptibility   Pseudomonas aeruginosa - MIC*    CEFTAZIDIME 2 SENSITIVE Sensitive     CIPROFLOXACIN 1 SENSITIVE Sensitive     GENTAMICIN >=16 RESISTANT Resistant     IMIPENEM >=16 RESISTANT Resistant     PIP/TAZO <=4 SENSITIVE Sensitive     * FEW PSEUDOMONAS AERUGINOSA  Culture, blood (routine x 2)     Status: None   Collection Time: 09/18/20 12:05 PM   Specimen: BLOOD RIGHT ARM   Result Value Ref Range Status   Specimen Description BLOOD RIGHT ARM  Final   Special Requests   Final    BOTTLES DRAWN AEROBIC AND ANAEROBIC Blood Culture adequate volume   Culture   Final    NO GROWTH 5 DAYS Performed at [facility], [address], [place], [place] [address]  Report Status 09/23/2020 FINAL  Final  Culture, blood (routine x 2)     Status: None   Collection Time: 09/18/20 12:05 PM   Specimen: BLOOD RIGHT HAND  Result Value Ref Range Status   Specimen Description BLOOD RIGHT HAND  Final   Special Requests   Final    BOTTLES DRAWN AEROBIC AND ANAEROBIC Blood Culture adequate volume   Culture   Final    NO GROWTH 5 DAYS Performed at [facility], [place] [address] [place], [place] [address]    Report Status 09/23/2020 FINAL  Final  Culture, Respiratory w Gram Stain     Status: None   Collection Time: 09/29/20 12:06 PM   Specimen: Tracheal Aspirate; Respiratory  Result Value Ref Range Status   Specimen Description TRACHEAL ASPIRATE  Final   Special Requests NONE  Final   Gram Stain   Final    RARE SQUAMOUS EPITHELIAL CELLS PRESENT ABUNDANT WBC PRESENT,BOTH PMN AND MONONUCLEAR ABUNDANT GRAM NEGATIVE RODS    Culture   Final    FEW PSEUDOMONAS AERUGINOSA MULTI-DRUG RESISTANT ORGANISM CRITICAL RESULT CALLED TO, READ BACK BY AND VERIFIED WITH: [place] RN @1145  10/03/20 EB Performed at [facility], [place] [address], [place], [place] [address]    Report Status 10/03/2020 FINAL  Final   Organism ID, Bacteria PSEUDOMONAS AERUGINOSA  Final      Susceptibility   Pseudomonas aeruginosa - MIC*    CEFTAZIDIME 16 INTERMEDIATE Intermediate     CIPROFLOXACIN 0.5 SENSITIVE Sensitive     GENTAMICIN >=16 RESISTANT Resistant     IMIPENEM >=16 RESISTANT Resistant     * FEW PSEUDOMONAS AERUGINOSA  Culture, blood (routine x 2)     Status: Abnormal   Collection Time: 09/29/20  1:07 PM   Specimen: BLOOD RIGHT ARM  Result Value Ref Range Status   Specimen  Description BLOOD RIGHT ARM  Final   Special Requests   Final    BOTTLES DRAWN AEROBIC AND ANAEROBIC Blood Culture adequate volume   Culture  Setup Time   Final    GRAM POSITIVE COCCI IN CHAINS AEROBIC BOTTLE ONLY CRITICAL RESULT CALLED TO, READ BACK BY AND VERIFIED WITH: RN S.SNED AT 1233 ON 09/30/2020 BY T.SAAD. Performed at [facility], [place] [address] [place], [place] [address]    Culture VANCOMYCIN RESISTANT ENTEROCOCCUS (A)  Final   Report Status 10/02/2020 FINAL  Final   Organism ID, Bacteria VANCOMYCIN RESISTANT ENTEROCOCCUS  Final      Susceptibility   Vancomycin resistant enterococcus - MIC*    AMPICILLIN <=2 SENSITIVE Sensitive     VANCOMYCIN >=32 RESISTANT Resistant     GENTAMICIN SYNERGY SENSITIVE Sensitive     LINEZOLID 2 SENSITIVE Sensitive     * VANCOMYCIN RESISTANT ENTEROCOCCUS  Blood Culture ID Panel (Reflexed)     Status: Abnormal   Collection Time: 09/29/20  1:07 PM  Result Value Ref Range Status   Enterococcus faecalis DETECTED (A) NOT DETECTED Final    Comment: CRITICAL RESULT CALLED TO, READ BACK BY AND VERIFIED WITH: RN S.SNED AT 1233 ON 09/30/2020 BY T.SAAD.    Enterococcus Faecium NOT DETECTED NOT DETECTED Final   Listeria monocytogenes NOT DETECTED NOT DETECTED Final   Staphylococcus species NOT DETECTED NOT DETECTED Final   Staphylococcus aureus (BCID) NOT DETECTED NOT DETECTED Final   Staphylococcus epidermidis NOT DETECTED NOT DETECTED Final   Staphylococcus lugdunensis NOT DETECTED NOT DETECTED Final   Streptococcus species NOT DETECTED NOT DETECTED Final  Streptococcus agalactiae NOT DETECTED NOT DETECTED Final   Streptococcus pneumoniae NOT DETECTED NOT DETECTED Final   Streptococcus pyogenes NOT DETECTED NOT DETECTED Final   A.calcoaceticus-baumannii NOT DETECTED NOT DETECTED Final   Bacteroides fragilis NOT DETECTED NOT DETECTED Final   Enterobacterales NOT DETECTED NOT DETECTED Final   Enterobacter cloacae complex NOT DETECTED NOT  DETECTED Final   Escherichia coli NOT DETECTED NOT DETECTED Final   Klebsiella aerogenes NOT DETECTED NOT DETECTED Final   Klebsiella oxytoca NOT DETECTED NOT DETECTED Final   Klebsiella pneumoniae NOT DETECTED NOT DETECTED Final   Proteus species NOT DETECTED NOT DETECTED Final   Salmonella species NOT DETECTED NOT DETECTED Final   Serratia marcescens NOT DETECTED NOT DETECTED Final   Haemophilus influenzae NOT DETECTED NOT DETECTED Final   Neisseria meningitidis NOT DETECTED NOT DETECTED Final   Pseudomonas aeruginosa NOT DETECTED NOT DETECTED Final   Stenotrophomonas maltophilia NOT DETECTED NOT DETECTED Final   Candida albicans NOT DETECTED NOT DETECTED Final   Candida auris NOT DETECTED NOT DETECTED Final   Candida glabrata NOT DETECTED NOT DETECTED Final   Candida krusei NOT DETECTED NOT DETECTED Final   Candida parapsilosis NOT DETECTED NOT DETECTED Final   Candida tropicalis NOT DETECTED NOT DETECTED Final   Cryptococcus neoformans/gattii NOT DETECTED NOT DETECTED Final   Vancomycin resistance DETECTED (A) NOT DETECTED Final    Comment: CRITICAL RESULT CALLED TO, READ BACK BY AND VERIFIED WITH: RN S.SNED AT 1233 ON 09/30/2020 BY T.SAAD. Performed at [facility], [place] [address] [place], [place] [address]   Culture, blood (routine x 2)     Status: None   Collection Time: 09/29/20  1:14 PM   Specimen: BLOOD RIGHT ARM  Result Value Ref Range Status   Specimen Description BLOOD RIGHT ARM  Final   Special Requests   Final    BOTTLES DRAWN AEROBIC AND ANAEROBIC Blood Culture adequate volume   Culture   Final    NO GROWTH 5 DAYS Performed at [facility], [place] [address], [place], [place] [address]    Report Status 10/04/2020 FINAL  Final  Culture, blood (routine x 2)     Status: None   Collection Time: 10/20/20  9:57 AM   Specimen: BLOOD RIGHT HAND  Result Value Ref Range Status   Specimen Description BLOOD RIGHT HAND  Final   Special Requests   Final     BOTTLES DRAWN AEROBIC ONLY Blood Culture adequate volume   Culture   Final    NO GROWTH 5 DAYS Performed at [facility], [place] [name], [place], [place] [address]    Report Status 10/25/2020 FINAL  Final  Culture, blood (routine x 2)     Status: None   Collection Time: 10/20/20  9:57 AM   Specimen: BLOOD RIGHT HAND  Result Value Ref Range Status   Specimen Description BLOOD RIGHT HAND  Final   Special Requests   Final    BOTTLES DRAWN AEROBIC ONLY Blood Culture adequate volume   Culture   Final    NO GROWTH 5 DAYS Performed at [facility], [place] [address] [place], [place] [address]    Report Status 10/25/2020 FINAL  Final    Coagulation Studies: No results for input(s): LABPROT, INR in the last 72 hours.   Urinalysis: No results for input(s): COLORURINE, LABSPEC, PHURINE, GLUCOSEU, HGBUR, BILIRUBINUR, KETONESUR, PROTEINUR, UROBILINOGEN, NITRITE, LEUKOCYTESUR in the last 72 hours.  Invalid input(s): APPERANCEUR    Imaging: No  results found.   Medications:       Assessment/ Plan:  45 y.o. male with a PMHx of ESRD on HD, posterior reversible encephalopathy syndrome, diabetes mellitus type 2, chronic systolic heart failure, anemia of chronic kidney disease, secondary hyperparathyroidism, malignant hypertension, acute respiratory failure, who was admitted to [facility] on 06/29/2020 for ongoing treatment of acute respiratory failure, posterior reversible encephalopathy syndrome, and end-stage renal disease.   1.  ESRD on HD.  Continue dialysis on MWF schedule.  Due for hemodialysis treatment today.  2.  Anemia of chronic kidney disease.  Hemoglobin currently 8.3.  Maintain the patient on Retacrit 6000 units IV with dialysis today.  3.  Secondary hyperparathyroidism.  Phosphorus currently 3.2 and acceptable.  4.  Acute respiratory failure.  Tracheostomy placed 07/17/2020.  Remains on T-piece collar at the moment.  5.  Hypertension.  Inaccurate blood  pressures noted in arms due to severe swelling.  Patient to be maintained on amlodipine, clonidine, hydralazine, labetalol, and losartan.   Charleen Madera 10/26/20227:51 AM

## 2020-10-29 NOTE — Consult Note (Signed)
Ref: Jacqualine Code, DO   Subjective:  Awake. Not communicative. BP very high at times.  Anti-anxiety medication helping little.  Objective:  Vital Signs in the last 24 hours:  P: 55, R: 17, BP: 163/95, O2 sat 100 % on 28 % FiO2  Physical Exam: BP Readings from Last 1 Encounters:  07/28/20 (!) 168/82     Wt Readings from Last 1 Encounters:  No data found for Wt    Weight change:  There is no height or weight on file to calculate BMI. HEENT: [place]/AT, Eyes-Brown, Conjunctiva-Pale, Sclera-Non-icteric Neck: No JVD, No bruit, Tracheostomy present Lungs:  Clearing, Bilateral. Cardiac:  Regular rhythm, normal S1 and S2, no S3. II/VI systolic murmur. Abdomen:  Soft, non-tender. BS present. Extremities:  2 + UE edema present. No cyanosis. No clubbing. CNS: AxOx0.  Skin: Warm and dry.   Intake/Output from previous day: No intake/output data recorded.    Lab Results: BMET    Component Value Date/Time   NA 130 (L) 10/28/2020 0503   NA 127 (L) 10/26/2020 0853   NA 129 (L) 10/23/2020 0425   K 4.2 10/28/2020 0503   K 4.6 10/26/2020 0853   K 3.8 10/23/2020 0425   CL 96 (L) 10/28/2020 0503   CL 91 (L) 10/26/2020 0853   CL 96 (L) 10/23/2020 0425   CO2 26 10/28/2020 0503   CO2 26 10/26/2020 0853   CO2 27 10/23/2020 0425   GLUCOSE 131 (H) 10/28/2020 0503   GLUCOSE 137 (H) 10/26/2020 0853   GLUCOSE 181 (H) 10/23/2020 0425   BUN 44 (H) 10/28/2020 0503   BUN 43 (H) 10/26/2020 0853   BUN 32 (H) 10/23/2020 0425   CREATININE 2.87 (H) 10/28/2020 0503   CREATININE 3.06 (H) 10/26/2020 0853   CREATININE 2.47 (H) 10/23/2020 0425   CALCIUM 8.3 (L) 10/28/2020 0503   CALCIUM 8.6 (L) 10/26/2020 0853   CALCIUM 8.2 (L) 10/23/2020 0425   GFRNONAA 27 (L) 10/28/2020 0503   GFRNONAA 25 (L) 10/26/2020 0853   GFRNONAA 32 (L) 10/23/2020 0425   CBC    Component Value Date/Time   WBC 7.2 10/28/2020 0503   RBC 3.38 (L) 10/28/2020 0503   HGB 8.3 (L) 10/28/2020 0503   HCT 27.9 (L)  10/28/2020 0503   PLT 149 (L) 10/28/2020 0503   MCV 82.5 10/28/2020 0503   MCH 24.6 (L) 10/28/2020 0503   MCHC 29.7 (L) 10/28/2020 0503   RDW 16.8 (H) 10/28/2020 0503   LYMPHSABS 1.4 09/21/2020 1052   MONOABS 0.6 09/21/2020 1052   EOSABS 0.3 09/21/2020 1052   BASOSABS 0.1 09/21/2020 1052   HEPATIC Function Panel Recent Labs    06/29/20 2137  PROT 6.0*   HEMOGLOBIN A1C No components found for: HGA1C,  MPG CARDIAC ENZYMES No results found for: CKTOTAL, CKMB, CKMBINDEX, TROPONINI BNP No results for input(s): PROBNP in the last 8760 hours. TSH Recent Labs    06/29/20 2137  TSH 5.995*   CHOLESTEROL No results for input(s): CHOL in the last 8760 hours.  Scheduled Meds: Continuous Infusions: PRN Meds:.  Assessment/Plan:  Hypertension Acute on chronic respiratory failure with hypoxia ESRD with HD Anemia of CKD  Plan:  Continue medical treatment.   LOS: 0 days   Time spent including chart review, lab review, examination, discussion with patient/Nurse : 25 min   Dixie Dials  MD  10/29/2020, 1:06 PM

## 2020-10-30 LAB — RENAL FUNCTION PANEL
Albumin: <1.5 g/dL — ABNORMAL LOW (ref 3.5–5.0)
Anion gap: 9 (ref 5–15)
BUN: 39 mg/dL — ABNORMAL HIGH (ref 6–20)
CO2: 26 mmol/L (ref 22–32)
Calcium: 8.4 mg/dL — ABNORMAL LOW (ref 8.9–10.3)
Chloride: 96 mmol/L — ABNORMAL LOW (ref 98–111)
Creatinine, Ser: 2.55 mg/dL — ABNORMAL HIGH (ref 0.61–1.24)
GFR, Estimated: 31 mL/min — ABNORMAL LOW (ref >60)
Glucose, Bld: 157 mg/dL — ABNORMAL HIGH (ref 70–99)
Phosphorus: 3 mg/dL (ref 2.5–4.6)
Potassium: 4 mmol/L (ref 3.5–5.1)
Sodium: 131 mmol/L — ABNORMAL LOW (ref 135–145)

## 2020-10-30 LAB — CBC
HCT: 30 % — ABNORMAL LOW (ref 39.0–52.0)
Hemoglobin: 8.7 g/dL — ABNORMAL LOW (ref 13.0–17.0)
MCH: 24.1 pg — ABNORMAL LOW (ref 26.0–34.0)
MCHC: 29 g/dL — ABNORMAL LOW (ref 30.0–36.0)
MCV: 83.1 fL (ref 80.0–100.0)
Platelets: 146 10*3/uL — ABNORMAL LOW (ref 150–400)
RBC: 3.61 MIL/uL — ABNORMAL LOW (ref 4.22–5.81)
RDW: 16.5 % — ABNORMAL HIGH (ref 11.5–15.5)
WBC: 8.5 10*3/uL (ref 4.0–10.5)
nRBC: 0 % (ref 0.0–0.2)

## 2020-10-30 LAB — HEPATITIS B SURFACE ANTIGEN: Hepatitis B Surface Ag: NONREACTIVE

## 2020-10-30 LAB — HEPATITIS B CORE ANTIBODY, TOTAL: Hep B Core Total Ab: NONREACTIVE

## 2020-10-30 NOTE — Progress Notes (Signed)
[facility]  ROUNDING NOTE   Subjective:  Patient completed dialysis treatment this AM. Tolerated the treatment well. Currently awake but nonverbal.  Objective:  Vital signs in last 24 hours:  Temperature 95 pulse 88 respirations 19 blood pressure 192/105  Physical Exam: General:  No acute distress  Head:  Normocephalic, atraumatic.    Eyes:  Anicteric  Neck:  Tracheostomy in place  Lungs:   Scattered rhonchi, normal effort  Heart:  S1S2 no rubs  Abdomen:   Soft, nontender, bowel sounds present  Extremities:  3+ bilateral upper extremity edema, no lower extremity edema  Neurologic:  Bilateral hand contractures, not following commands  Skin:  No acute skin rash  Access:  Left upper extremity AV fistula    Basic Metabolic Panel: Recent Labs  Lab 10/26/20 0853 10/28/20 0503 10/30/20 0450  NA 127* 130* 131*  K 4.6 4.2 4.0  CL 91* 96* 96*  CO2 26 26 26   GLUCOSE 137* 131* 157*  BUN 43* 44* 39*  CREATININE 3.06* 2.87* 2.55*  CALCIUM 8.6* 8.3* 8.4*  PHOS 3.4 3.2 3.0     Liver Function Tests: Recent Labs  Lab 10/26/20 0853 10/28/20 0503 10/30/20 0450  ALBUMIN 1.5* <1.5* <1.5*    No results for input(s): LIPASE, AMYLASE in the last 168 hours. No results for input(s): AMMONIA in the last 168 hours.  CBC: Recent Labs  Lab 10/26/20 0853 10/28/20 0503 10/30/20 0450  WBC 7.0 7.2 8.5  HGB 9.2* 8.3* 8.7*  HCT 30.8* 27.9* 30.0*  MCV 83.7 82.5 83.1  PLT 172 149* 146*     Cardiac Enzymes: No results for input(s): CKTOTAL, CKMB, CKMBINDEX, TROPONINI in the last 168 hours.  BNP: Invalid input(s): POCBNP  CBG: No results for input(s): GLUCAP in the last 168 hours.  Microbiology: Results for orders placed or performed during the hospital encounter of 07/17/20  Culture, blood (routine x 2)     Status: None   Collection Time: 07/27/20 11:17 AM   Specimen: BLOOD RIGHT ARM  Result Value Ref Range Status   Specimen Description BLOOD RIGHT ARM   Final   Special Requests   Final    BOTTLES DRAWN AEROBIC ONLY Blood Culture results may not be optimal due to an inadequate volume of blood received in culture bottles   Culture   Final    NO GROWTH 5 DAYS Performed at [facility], [place] [address], [place], [place] [address]    Report Status 08/01/2020 FINAL  Final  Culture, blood (routine x 2)     Status: None   Collection Time: 07/27/20 11:23 AM   Specimen: BLOOD RIGHT HAND  Result Value Ref Range Status   Specimen Description BLOOD RIGHT HAND  Final   Special Requests   Final    BOTTLES DRAWN AEROBIC ONLY Blood Culture results may not be optimal due to an inadequate volume of blood received in culture bottles   Culture   Final    NO GROWTH 5 DAYS Performed at [facility], [place] [address], [place], [place] [address]    Report Status 08/01/2020 FINAL  Final  Culture, blood (routine x 2)     Status: None   Collection Time: 08/01/20  2:25 PM   Specimen: BLOOD RIGHT HAND  Result Value Ref Range Status   Specimen Description BLOOD RIGHT HAND  Final   Special Requests   Final    BOTTLES DRAWN AEROBIC AND ANAEROBIC Blood Culture results may not be optimal due  to an inadequate volume of blood received in culture bottles   Culture   Final    NO GROWTH 5 DAYS Performed at [facility], [place] [address] [place], [place] [address]    Report Status 08/06/2020 FINAL  Final  Culture, blood (routine x 2)     Status: None   Collection Time: 08/01/20  2:39 PM   Specimen: BLOOD RIGHT ARM  Result Value Ref Range Status   Specimen Description BLOOD RIGHT ARM  Final   Special Requests   Final    BOTTLES DRAWN AEROBIC ONLY Blood Culture results may not be optimal due to an inadequate volume of blood received in culture bottles   Culture   Final    NO GROWTH 5 DAYS Performed at [facility], [place] [address], [place], [place] [address]    Report Status 08/06/2020 FINAL  Final  Culture, Respiratory w Gram Stain     Status:  None   Collection Time: 08/01/20  2:55 PM   Specimen: Tracheal Aspirate; Respiratory  Result Value Ref Range Status   Specimen Description TRACHEAL ASPIRATE  Final   Special Requests NONE  Final   Gram Stain   Final    FEW WBC PRESENT, PREDOMINANTLY PMN MODERATE GRAM NEGATIVE RODS Performed at [facility], [place] [address] [place], [place] [address]    Culture ABUNDANT PSEUDOMONAS AERUGINOSA  Final   Report Status 08/04/2020 FINAL  Final   Organism ID, Bacteria PSEUDOMONAS AERUGINOSA  Final      Susceptibility   Pseudomonas aeruginosa - MIC*    CEFTAZIDIME <=1 SENSITIVE Sensitive     CIPROFLOXACIN <=0.25 SENSITIVE Sensitive     GENTAMICIN 8 INTERMEDIATE Intermediate     IMIPENEM >=16 RESISTANT Resistant     PIP/TAZO <=4 SENSITIVE Sensitive     CEFEPIME 2 SENSITIVE Sensitive     * ABUNDANT PSEUDOMONAS AERUGINOSA  Culture, Respiratory w Gram Stain     Status: None   Collection Time: 09/17/20  5:21 PM   Specimen: Tracheal Aspirate; Respiratory  Result Value Ref Range Status   Specimen Description TRACHEAL ASPIRATE  Final   Special Requests NONE  Final   Gram Stain   Final    ABUNDANT WBC PRESENT,BOTH PMN AND MONONUCLEAR MODERATE SQUAMOUS EPITHELIAL CELLS PRESENT MODERATE GRAM POSITIVE COCCI MODERATE GRAM NEGATIVE RODS Performed at [facility], [place] [address] [place], [place] [address]    Culture FEW PSEUDOMONAS AERUGINOSA  Final   Report Status 09/22/2020 FINAL  Final   Organism ID, Bacteria PSEUDOMONAS AERUGINOSA  Final      Susceptibility   Pseudomonas aeruginosa - MIC*    CEFTAZIDIME 2 SENSITIVE Sensitive     CIPROFLOXACIN 1 SENSITIVE Sensitive     GENTAMICIN >=16 RESISTANT Resistant     IMIPENEM >=16 RESISTANT Resistant     PIP/TAZO <=4 SENSITIVE Sensitive     * FEW PSEUDOMONAS AERUGINOSA  Culture, blood (routine x 2)     Status: None   Collection Time: 09/18/20 12:05 PM   Specimen: BLOOD RIGHT ARM  Result Value Ref Range Status   Specimen Description  BLOOD RIGHT ARM  Final   Special Requests   Final    BOTTLES DRAWN AEROBIC AND ANAEROBIC Blood Culture adequate volume   Culture   Final    NO GROWTH 5 DAYS Performed at [facility], [address], [place], [place] [address]    Report Status 09/23/2020 FINAL  Final  Culture, blood (routine x  2)     Status: None   Collection Time: 09/18/20 12:05 PM   Specimen: BLOOD RIGHT HAND  Result Value Ref Range Status   Specimen Description BLOOD RIGHT HAND  Final   Special Requests   Final    BOTTLES DRAWN AEROBIC AND ANAEROBIC Blood Culture adequate volume   Culture   Final    NO GROWTH 5 DAYS Performed at [facility], [address], [place], [place] [address]    Report Status 09/23/2020 FINAL  Final  Culture, Respiratory w Gram Stain     Status: None   Collection Time: 09/29/20 12:06 PM   Specimen: Tracheal Aspirate; Respiratory  Result Value Ref Range Status   Specimen Description TRACHEAL ASPIRATE  Final   Special Requests NONE  Final   Gram Stain   Final    RARE SQUAMOUS EPITHELIAL CELLS PRESENT ABUNDANT WBC PRESENT,BOTH PMN AND MONONUCLEAR ABUNDANT GRAM NEGATIVE RODS    Culture   Final    FEW PSEUDOMONAS AERUGINOSA MULTI-DRUG RESISTANT ORGANISM CRITICAL RESULT CALLED TO, READ BACK BY AND VERIFIED WITH: [place] RN @1145  10/03/20 EB Performed at [facility], [place] [address], [place], [place] [address]    Report Status 10/03/2020 FINAL  Final   Organism ID, Bacteria PSEUDOMONAS AERUGINOSA  Final      Susceptibility   Pseudomonas aeruginosa - MIC*    CEFTAZIDIME 16 INTERMEDIATE Intermediate     CIPROFLOXACIN 0.5 SENSITIVE Sensitive     GENTAMICIN >=16 RESISTANT Resistant     IMIPENEM >=16 RESISTANT Resistant     * FEW PSEUDOMONAS AERUGINOSA  Culture, blood (routine x 2)     Status: Abnormal   Collection Time: 09/29/20  1:07 PM   Specimen: BLOOD RIGHT ARM  Result Value Ref Range Status   Specimen Description BLOOD RIGHT ARM  Final   Special Requests    Final    BOTTLES DRAWN AEROBIC AND ANAEROBIC Blood Culture adequate volume   Culture  Setup Time   Final    GRAM POSITIVE COCCI IN CHAINS AEROBIC BOTTLE ONLY CRITICAL RESULT CALLED TO, READ BACK BY AND VERIFIED WITH: RN S.SNED AT 1233 ON 09/30/2020 BY T.SAAD. Performed at [facility], [place] [address], [place], [place] [address]    Culture VANCOMYCIN RESISTANT ENTEROCOCCUS (A)  Final   Report Status 10/02/2020 FINAL  Final   Organism ID, Bacteria VANCOMYCIN RESISTANT ENTEROCOCCUS  Final      Susceptibility   Vancomycin resistant enterococcus - MIC*    AMPICILLIN <=2 SENSITIVE Sensitive     VANCOMYCIN >=32 RESISTANT Resistant     GENTAMICIN SYNERGY SENSITIVE Sensitive     LINEZOLID 2 SENSITIVE Sensitive     * VANCOMYCIN RESISTANT ENTEROCOCCUS  Blood Culture ID Panel (Reflexed)     Status: Abnormal   Collection Time: 09/29/20  1:07 PM  Result Value Ref Range Status   Enterococcus faecalis DETECTED (A) NOT DETECTED Final    Comment: CRITICAL RESULT CALLED TO, READ BACK BY AND VERIFIED WITH: RN S.SNED AT 1233 ON 09/30/2020 BY T.SAAD.    Enterococcus Faecium NOT DETECTED NOT DETECTED Final   Listeria monocytogenes NOT DETECTED NOT DETECTED Final   Staphylococcus species NOT DETECTED NOT DETECTED Final   Staphylococcus aureus (BCID) NOT DETECTED NOT DETECTED Final   Staphylococcus epidermidis NOT DETECTED NOT DETECTED Final   Staphylococcus lugdunensis NOT DETECTED NOT DETECTED Final   Streptococcus species NOT DETECTED NOT DETECTED Final   Streptococcus agalactiae NOT DETECTED NOT DETECTED Final   Streptococcus pneumoniae  NOT DETECTED NOT DETECTED Final   Streptococcus pyogenes NOT DETECTED NOT DETECTED Final   A.calcoaceticus-baumannii NOT DETECTED NOT DETECTED Final   Bacteroides fragilis NOT DETECTED NOT DETECTED Final   Enterobacterales NOT DETECTED NOT DETECTED Final   Enterobacter cloacae complex NOT DETECTED NOT DETECTED Final   Escherichia coli NOT DETECTED NOT DETECTED  Final   Klebsiella aerogenes NOT DETECTED NOT DETECTED Final   Klebsiella oxytoca NOT DETECTED NOT DETECTED Final   Klebsiella pneumoniae NOT DETECTED NOT DETECTED Final   Proteus species NOT DETECTED NOT DETECTED Final   Salmonella species NOT DETECTED NOT DETECTED Final   Serratia marcescens NOT DETECTED NOT DETECTED Final   Haemophilus influenzae NOT DETECTED NOT DETECTED Final   Neisseria meningitidis NOT DETECTED NOT DETECTED Final   Pseudomonas aeruginosa NOT DETECTED NOT DETECTED Final   Stenotrophomonas maltophilia NOT DETECTED NOT DETECTED Final   Candida albicans NOT DETECTED NOT DETECTED Final   Candida auris NOT DETECTED NOT DETECTED Final   Candida glabrata NOT DETECTED NOT DETECTED Final   Candida krusei NOT DETECTED NOT DETECTED Final   Candida parapsilosis NOT DETECTED NOT DETECTED Final   Candida tropicalis NOT DETECTED NOT DETECTED Final   Cryptococcus neoformans/gattii NOT DETECTED NOT DETECTED Final   Vancomycin resistance DETECTED (A) NOT DETECTED Final    Comment: CRITICAL RESULT CALLED TO, READ BACK BY AND VERIFIED WITH: RN S.SNED AT 1233 ON 09/30/2020 BY T.SAAD. Performed at [facility], [place] [address], [place], [place] [address]   Culture, blood (routine x 2)     Status: None   Collection Time: 09/29/20  1:14 PM   Specimen: BLOOD RIGHT ARM  Result Value Ref Range Status   Specimen Description BLOOD RIGHT ARM  Final   Special Requests   Final    BOTTLES DRAWN AEROBIC AND ANAEROBIC Blood Culture adequate volume   Culture   Final    NO GROWTH 5 DAYS Performed at [facility], [place] [address] [place], [place] [address]    Report Status 10/04/2020 FINAL  Final  Culture, blood (routine x 2)     Status: None   Collection Time: 10/20/20  9:57 AM   Specimen: BLOOD RIGHT HAND  Result Value Ref Range Status   Specimen Description BLOOD RIGHT HAND  Final   Special Requests   Final    BOTTLES DRAWN AEROBIC ONLY Blood Culture adequate volume   Culture    Final    NO GROWTH 5 DAYS Performed at [facility], [place] [address] [place], [place] [address]    Report Status 10/25/2020 FINAL  Final  Culture, blood (routine x 2)     Status: None   Collection Time: 10/20/20  9:57 AM   Specimen: BLOOD RIGHT HAND  Result Value Ref Range Status   Specimen Description BLOOD RIGHT HAND  Final   Special Requests   Final    BOTTLES DRAWN AEROBIC ONLY Blood Culture adequate volume   Culture   Final    NO GROWTH 5 DAYS Performed at [facility], [place] [address] [place],  [address]    Report Status 10/25/2020 FINAL  Final    Coagulation Studies: No results for input(s): LABPROT, INR in the last 72 hours.   Urinalysis: No results for input(s): COLORURINE, LABSPEC, PHURINE, GLUCOSEU, HGBUR, BILIRUBINUR, KETONESUR, PROTEINUR, UROBILINOGEN, NITRITE, LEUKOCYTESUR in the last 72 hours.  Invalid input(s): APPERANCEUR    Imaging: No results found.   Medications:  Assessment/ Plan:  45 y.o. male with a PMHx of ESRD on HD, posterior reversible encephalopathy syndrome, diabetes mellitus type 2, chronic systolic heart failure, anemia of chronic kidney disease, secondary hyperparathyroidism, malignant hypertension, acute respiratory failure, who was admitted to [facility] on 06/29/2020 for ongoing treatment of acute respiratory failure, posterior reversible encephalopathy syndrome, and end-stage renal disease.   1.  ESRD on HD.  Patient completed dialysis treatment today.  Tolerated treatment well.  Next dialysis treatment on Monday.  2.  Anemia of chronic kidney disease.  Hemoglobin now up to 8.7.  Continue Retacrit 6000 units IV with dialysis treatments.  3.  Secondary hyperparathyroidism.  Phosphorus at target at 3.0.  Continue to periodically monitor.  4.  Acute respiratory failure.  Tracheostomy placed 07/17/2020.  Remains on T-piece collar at the moment.  5.  Hypertension.  Inaccurate blood pressures noted in arms  due to severe swelling.  Patient to be maintained on amlodipine, clonidine, hydralazine, labetalol, and losartan.   Jeffery Andrews 10/28/202212:03 PM

## 2020-10-31 LAB — HEPATITIS B SURFACE ANTIBODY, QUANTITATIVE: Hep B S AB Quant (Post): 159.2 m[IU]/mL (ref >9.9)

## 2020-10-31 NOTE — Consult Note (Signed)
Ref: Jacqualine Code, DO   Subjective:  Awake. Improved BP control today. Hyponatremia improving.  Objective:  Vital Signs in the last 24 hours:  P: 55, R: 10, BP: 179/92, O2 sat 100 % on 28 %% FiO2.  Physical Exam: BP Readings from Last 1 Encounters:  07/28/20 (!) 168/82     Wt Readings from Last 1 Encounters:  No data found for Wt    Weight change:  There is no height or weight on file to calculate BMI. HEENT: [place]/AT, Eyes-Brown, Conjunctiva-Pale, Sclera-Non-icteric Neck: No JVD, No bruit, Trachea midline. Lungs:  Clearing, Bilateral. Cardiac:  Regular rhythm, normal S1 and S2, no S3. II/VI systolic murmur. Abdomen:  Soft, non-tender. BS present. Extremities:  2 + UE edema present. No cyanosis. No clubbing. + UE contracture, CNS: AxOx0.  Skin: Warm and dry.   Intake/Output from previous day: No intake/output data recorded.    Lab Results: BMET    Component Value Date/Time   NA 131 (L) 10/30/2020 0450   NA 130 (L) 10/28/2020 0503   NA 127 (L) 10/26/2020 0853   K 4.0 10/30/2020 0450   K 4.2 10/28/2020 0503   K 4.6 10/26/2020 0853   CL 96 (L) 10/30/2020 0450   CL 96 (L) 10/28/2020 0503   CL 91 (L) 10/26/2020 0853   CO2 26 10/30/2020 0450   CO2 26 10/28/2020 0503   CO2 26 10/26/2020 0853   GLUCOSE 157 (H) 10/30/2020 0450   GLUCOSE 131 (H) 10/28/2020 0503   GLUCOSE 137 (H) 10/26/2020 0853   BUN 39 (H) 10/30/2020 0450   BUN 44 (H) 10/28/2020 0503   BUN 43 (H) 10/26/2020 0853   CREATININE 2.55 (H) 10/30/2020 0450   CREATININE 2.87 (H) 10/28/2020 0503   CREATININE 3.06 (H) 10/26/2020 0853   CALCIUM 8.4 (L) 10/30/2020 0450   CALCIUM 8.3 (L) 10/28/2020 0503   CALCIUM 8.6 (L) 10/26/2020 0853   GFRNONAA 31 (L) 10/30/2020 0450   GFRNONAA 27 (L) 10/28/2020 0503   GFRNONAA 25 (L) 10/26/2020 0853   CBC    Component Value Date/Time   WBC 8.5 10/30/2020 0450   RBC 3.61 (L) 10/30/2020 0450   HGB 8.7 (L) 10/30/2020 0450   HCT 30.0 (L) 10/30/2020 0450   PLT 146  (L) 10/30/2020 0450   MCV 83.1 10/30/2020 0450   MCH 24.1 (L) 10/30/2020 0450   MCHC 29.0 (L) 10/30/2020 0450   RDW 16.5 (H) 10/30/2020 0450   LYMPHSABS 1.4 09/21/2020 1052   MONOABS 0.6 09/21/2020 1052   EOSABS 0.3 09/21/2020 1052   BASOSABS 0.1 09/21/2020 1052   HEPATIC Function Panel Recent Labs    06/29/20 2137  PROT 6.0*   HEMOGLOBIN A1C No components found for: HGA1C,  MPG CARDIAC ENZYMES No results found for: CKTOTAL, CKMB, CKMBINDEX, TROPONINI BNP No results for input(s): PROBNP in the last 8760 hours. TSH Recent Labs    06/29/20 2137  TSH 5.995*   CHOLESTEROL No results for input(s): CHOL in the last 8760 hours.  Scheduled Meds: Continuous Infusions: PRN Meds:.  Assessment/Plan:  Hypertension, uncontrolled Acute on chronic respiratory failure with hypoxia ESRD with HD Anemia of CKD S/P stroke S/P encephalopathy  Plan: Continue medical treatment.   LOS: 0 days   Time spent including chart review, lab review, examination, discussion with patient/Nurse : 30 min   Dixie Dials  MD  10/31/2020, 11:28 AM

## 2020-11-01 NOTE — Consult Note (Signed)
Ref: Jacqualine Code, DO   Subjective:  Awake. Stable blood pressures. Beta-blocker held this AM for heart rate in 50's.  Objective:  Vital Signs in the last 24 hours:  P: 60, R: 18, BP: 171/96, O2 sat 100 % on 28 % FiO2.  Physical Exam: BP Readings from Last 1 Encounters:  07/28/20 (!) 168/82     Wt Readings from Last 1 Encounters:  No data found for Wt    Weight change:  There is no height or weight on file to calculate BMI. HEENT: [place]/AT, Eyes-Brown, Conjunctiva-Pale, Sclera-Non-icteric Neck: No JVD, No bruit, Trachea midline. Lungs:  Clearing, Bilateral. Cardiac:  Regular rhythm, normal S1 and S2, no S3. II/VI systolic murmur. Abdomen:  Soft, non-tender. BS present. Extremities:  2 + UE edema present. No cyanosis. No clubbing. CNS: AxOx0.  Skin: Warm and dry.   Intake/Output from previous day: No intake/output data recorded.    Lab Results: BMET    Component Value Date/Time   NA 131 (L) 10/30/2020 0450   NA 130 (L) 10/28/2020 0503   NA 127 (L) 10/26/2020 0853   K 4.0 10/30/2020 0450   K 4.2 10/28/2020 0503   K 4.6 10/26/2020 0853   CL 96 (L) 10/30/2020 0450   CL 96 (L) 10/28/2020 0503   CL 91 (L) 10/26/2020 0853   CO2 26 10/30/2020 0450   CO2 26 10/28/2020 0503   CO2 26 10/26/2020 0853   GLUCOSE 157 (H) 10/30/2020 0450   GLUCOSE 131 (H) 10/28/2020 0503   GLUCOSE 137 (H) 10/26/2020 0853   BUN 39 (H) 10/30/2020 0450   BUN 44 (H) 10/28/2020 0503   BUN 43 (H) 10/26/2020 0853   CREATININE 2.55 (H) 10/30/2020 0450   CREATININE 2.87 (H) 10/28/2020 0503   CREATININE 3.06 (H) 10/26/2020 0853   CALCIUM 8.4 (L) 10/30/2020 0450   CALCIUM 8.3 (L) 10/28/2020 0503   CALCIUM 8.6 (L) 10/26/2020 0853   GFRNONAA 31 (L) 10/30/2020 0450   GFRNONAA 27 (L) 10/28/2020 0503   GFRNONAA 25 (L) 10/26/2020 0853   CBC    Component Value Date/Time   WBC 8.5 10/30/2020 0450   RBC 3.61 (L) 10/30/2020 0450   HGB 8.7 (L) 10/30/2020 0450   HCT 30.0 (L) 10/30/2020 0450   PLT  146 (L) 10/30/2020 0450   MCV 83.1 10/30/2020 0450   MCH 24.1 (L) 10/30/2020 0450   MCHC 29.0 (L) 10/30/2020 0450   RDW 16.5 (H) 10/30/2020 0450   LYMPHSABS 1.4 09/21/2020 1052   MONOABS 0.6 09/21/2020 1052   EOSABS 0.3 09/21/2020 1052   BASOSABS 0.1 09/21/2020 1052   HEPATIC Function Panel Recent Labs    06/29/20 2137  PROT 6.0*   HEMOGLOBIN A1C No components found for: HGA1C,  MPG CARDIAC ENZYMES No results found for: CKTOTAL, CKMB, CKMBINDEX, TROPONINI BNP No results for input(s): PROBNP in the last 8760 hours. TSH Recent Labs    06/29/20 2137  TSH 5.995*   CHOLESTEROL No results for input(s): CHOL in the last 8760 hours.  Scheduled Meds: Continuous Infusions: PRN Meds:.  Assessment/Plan:  Hypertension, improving control Acute on chronic respiratory failure with hypoxia ESRD with HD Anemia of CKD S/P stroke S/P encephalopathy  Plan: Continue medical treatment   LOS: 0 days   Time spent including chart review, lab review, examination, discussion with patient/Nurse : 30 min   Dixie Dials  MD  11/01/2020, 11:35 AM

## 2020-11-02 LAB — RENAL FUNCTION PANEL
Albumin: 1.5 g/dL — ABNORMAL LOW (ref 3.5–5.0)
Anion gap: 6 (ref 5–15)
BUN: 49 mg/dL — ABNORMAL HIGH (ref 6–20)
CO2: 27 mmol/L (ref 22–32)
Calcium: 8.5 mg/dL — ABNORMAL LOW (ref 8.9–10.3)
Chloride: 98 mmol/L (ref 98–111)
Creatinine, Ser: 2.81 mg/dL — ABNORMAL HIGH (ref 0.61–1.24)
GFR, Estimated: 27 mL/min — ABNORMAL LOW (ref >60)
Glucose, Bld: 173 mg/dL — ABNORMAL HIGH (ref 70–99)
Phosphorus: 3.5 mg/dL (ref 2.5–4.6)
Potassium: 4 mmol/L (ref 3.5–5.1)
Sodium: 131 mmol/L — ABNORMAL LOW (ref 135–145)

## 2020-11-02 LAB — CBC
HCT: 28.8 % — ABNORMAL LOW (ref 39.0–52.0)
Hemoglobin: 8.5 g/dL — ABNORMAL LOW (ref 13.0–17.0)
MCH: 24.6 pg — ABNORMAL LOW (ref 26.0–34.0)
MCHC: 29.5 g/dL — ABNORMAL LOW (ref 30.0–36.0)
MCV: 83.2 fL (ref 80.0–100.0)
Platelets: 174 10*3/uL (ref 150–400)
RBC: 3.46 MIL/uL — ABNORMAL LOW (ref 4.22–5.81)
RDW: 16.8 % — ABNORMAL HIGH (ref 11.5–15.5)
WBC: 9.6 10*3/uL (ref 4.0–10.5)
nRBC: 0 % (ref 0.0–0.2)

## 2020-11-02 NOTE — Progress Notes (Signed)
[facility]  ROUNDING NOTE   Subjective:  Patient seen and evaluated during hemodialysis treatment this AM. Sitting up in chair at the moment. Awake but not following commands.  Objective:  Vital signs in last 24 hours:  Temperature 96.6 pulse 62 respirations 20 blood pressure 155/92  Physical Exam: General:  No acute distress  Head:  Normocephalic, atraumatic.    Eyes:  Anicteric  Neck:  Tracheostomy in place  Lungs:   Scattered rhonchi, normal effort  Heart:  S1S2 no rubs  Abdomen:   Soft, nontender, bowel sounds present  Extremities:  3+ bilateral upper extremity edema, no lower extremity edema  Neurologic:  Bilateral hand contractures, not following commands  Skin:  No acute skin rash  Access:  Left upper extremity AV fistula    Basic Metabolic Panel: Recent Labs  Lab 10/26/20 0853 10/28/20 0503 10/30/20 0450 11/02/20 0413  NA 127* 130* 131* 131*  K 4.6 4.2 4.0 4.0  CL 91* 96* 96* 98  CO2 26 26 26 27   GLUCOSE 137* 131* 157* 173*  BUN 43* 44* 39* 49*  CREATININE 3.06* 2.87* 2.55* 2.81*  CALCIUM 8.6* 8.3* 8.4* 8.5*  PHOS 3.4 3.2 3.0 3.5     Liver Function Tests: Recent Labs  Lab 10/26/20 0853 10/28/20 0503 10/30/20 0450 11/02/20 0413  ALBUMIN 1.5* <1.5* <1.5* 1.5*    No results for input(s): LIPASE, AMYLASE in the last 168 hours. No results for input(s): AMMONIA in the last 168 hours.  CBC: Recent Labs  Lab 10/26/20 0853 10/28/20 0503 10/30/20 0450 11/02/20 0413  WBC 7.0 7.2 8.5 9.6  HGB 9.2* 8.3* 8.7* 8.5*  HCT 30.8* 27.9* 30.0* 28.8*  MCV 83.7 82.5 83.1 83.2  PLT 172 149* 146* 174     Cardiac Enzymes: No results for input(s): CKTOTAL, CKMB, CKMBINDEX, TROPONINI in the last 168 hours.  BNP: Invalid input(s): POCBNP  CBG: No results for input(s): GLUCAP in the last 168 hours.  Microbiology: Results for orders placed or performed during the hospital encounter of 07/17/20  Culture, blood (routine x 2)     Status: None    Collection Time: 07/27/20 11:17 AM   Specimen: BLOOD RIGHT ARM  Result Value Ref Range Status   Specimen Description BLOOD RIGHT ARM  Final   Special Requests   Final    BOTTLES DRAWN AEROBIC ONLY Blood Culture results may not be optimal due to an inadequate volume of blood received in culture bottles   Culture   Final    NO GROWTH 5 DAYS Performed at [facility], [place] [address] [place], [place] [address]    Report Status 08/01/2020 FINAL  Final  Culture, blood (routine x 2)     Status: None   Collection Time: 07/27/20 11:23 AM   Specimen: BLOOD RIGHT HAND  Result Value Ref Range Status   Specimen Description BLOOD RIGHT HAND  Final   Special Requests   Final    BOTTLES DRAWN AEROBIC ONLY Blood Culture results may not be optimal due to an inadequate volume of blood received in culture bottles   Culture   Final    NO GROWTH 5 DAYS Performed at [facility], [place] [address] [place], [place] [address]    Report Status 08/01/2020 FINAL  Final  Culture, blood (routine x 2)     Status: None   Collection Time: 08/01/20  2:25 PM   Specimen: BLOOD RIGHT HAND  Result Value Ref Range Status   Specimen Description  BLOOD RIGHT HAND  Final   Special Requests   Final    BOTTLES DRAWN AEROBIC AND ANAEROBIC Blood Culture results may not be optimal due to an inadequate volume of blood received in culture bottles   Culture   Final    NO GROWTH 5 DAYS Performed at [facility], [place] [address] [place], [place] [address]    Report Status 08/06/2020 FINAL  Final  Culture, blood (routine x 2)     Status: None   Collection Time: 08/01/20  2:39 PM   Specimen: BLOOD RIGHT ARM  Result Value Ref Range Status   Specimen Description BLOOD RIGHT ARM  Final   Special Requests   Final    BOTTLES DRAWN AEROBIC ONLY Blood Culture results may not be optimal due to an inadequate volume of blood received in culture bottles   Culture   Final    NO GROWTH 5 DAYS Performed at [facility], [place] [address], [place], [place] [address]    Report Status 08/06/2020 FINAL  Final  Culture, Respiratory w Gram Stain     Status: None   Collection Time: 08/01/20  2:55 PM   Specimen: Tracheal Aspirate; Respiratory  Result Value Ref Range Status   Specimen Description TRACHEAL ASPIRATE  Final   Special Requests NONE  Final   Gram Stain   Final    FEW WBC PRESENT, PREDOMINANTLY PMN MODERATE GRAM NEGATIVE RODS Performed at [facility], [place] [address] [place], [place] [address]    Culture ABUNDANT PSEUDOMONAS AERUGINOSA  Final   Report Status 08/04/2020 FINAL  Final   Organism ID, Bacteria PSEUDOMONAS AERUGINOSA  Final      Susceptibility   Pseudomonas aeruginosa - MIC*    CEFTAZIDIME <=1 SENSITIVE Sensitive     CIPROFLOXACIN <=0.25 SENSITIVE Sensitive     GENTAMICIN 8 INTERMEDIATE Intermediate     IMIPENEM >=16 RESISTANT Resistant     PIP/TAZO <=4 SENSITIVE Sensitive     CEFEPIME 2 SENSITIVE Sensitive     * ABUNDANT PSEUDOMONAS AERUGINOSA  Culture, Respiratory w Gram Stain     Status: None   Collection Time: 09/17/20  5:21 PM   Specimen: Tracheal Aspirate; Respiratory  Result Value Ref Range Status   Specimen Description TRACHEAL ASPIRATE  Final   Special Requests NONE  Final   Gram Stain   Final    ABUNDANT WBC PRESENT,BOTH PMN AND MONONUCLEAR MODERATE SQUAMOUS EPITHELIAL CELLS PRESENT MODERATE GRAM POSITIVE COCCI MODERATE GRAM NEGATIVE RODS Performed at [facility], [place] [address], [place], [place] [address]    Culture FEW PSEUDOMONAS AERUGINOSA  Final   Report Status 09/22/2020 FINAL  Final   Organism ID, Bacteria PSEUDOMONAS AERUGINOSA  Final      Susceptibility   Pseudomonas aeruginosa - MIC*    CEFTAZIDIME 2 SENSITIVE Sensitive     CIPROFLOXACIN 1 SENSITIVE Sensitive     GENTAMICIN >=16 RESISTANT Resistant     IMIPENEM >=16 RESISTANT Resistant     PIP/TAZO <=4 SENSITIVE Sensitive     * FEW PSEUDOMONAS AERUGINOSA  Culture, blood (routine x  2)     Status: None   Collection Time: 09/18/20 12:05 PM   Specimen: BLOOD RIGHT ARM  Result Value Ref Range Status   Specimen Description BLOOD RIGHT ARM  Final   Special Requests   Final    BOTTLES DRAWN AEROBIC AND ANAEROBIC Blood Culture adequate volume   Culture   Final    NO GROWTH 5  DAYS Performed at [facility], [place] [address], [place], [place] [address]    Report Status 09/23/2020 FINAL  Final  Culture, blood (routine x 2)     Status: None   Collection Time: 09/18/20 12:05 PM   Specimen: BLOOD RIGHT HAND  Result Value Ref Range Status   Specimen Description BLOOD RIGHT HAND  Final   Special Requests   Final    BOTTLES DRAWN AEROBIC AND ANAEROBIC Blood Culture adequate volume   Culture   Final    NO GROWTH 5 DAYS Performed at [facility], [place] [address] [place], [place] [address]    Report Status 09/23/2020 FINAL  Final  Culture, Respiratory w Gram Stain     Status: None   Collection Time: 09/29/20 12:06 PM   Specimen: Tracheal Aspirate; Respiratory  Result Value Ref Range Status   Specimen Description TRACHEAL ASPIRATE  Final   Special Requests NONE  Final   Gram Stain   Final    RARE SQUAMOUS EPITHELIAL CELLS PRESENT ABUNDANT WBC PRESENT,BOTH PMN AND MONONUCLEAR ABUNDANT GRAM NEGATIVE RODS    Culture   Final    FEW PSEUDOMONAS AERUGINOSA MULTI-DRUG RESISTANT ORGANISM CRITICAL RESULT CALLED TO, READ BACK BY AND VERIFIED WITH: [place] RN @1145  10/03/20 EB Performed at [facility], [place] [address] [place], [place] [address]    Report Status 10/03/2020 FINAL  Final   Organism ID, Bacteria PSEUDOMONAS AERUGINOSA  Final      Susceptibility   Pseudomonas aeruginosa - MIC*    CEFTAZIDIME 16 INTERMEDIATE Intermediate     CIPROFLOXACIN 0.5 SENSITIVE Sensitive     GENTAMICIN >=16 RESISTANT Resistant     IMIPENEM >=16 RESISTANT Resistant     * FEW PSEUDOMONAS AERUGINOSA  Culture, blood (routine x 2)     Status: Abnormal   Collection Time:  09/29/20  1:07 PM   Specimen: BLOOD RIGHT ARM  Result Value Ref Range Status   Specimen Description BLOOD RIGHT ARM  Final   Special Requests   Final    BOTTLES DRAWN AEROBIC AND ANAEROBIC Blood Culture adequate volume   Culture  Setup Time   Final    GRAM POSITIVE COCCI IN CHAINS AEROBIC BOTTLE ONLY CRITICAL RESULT CALLED TO, READ BACK BY AND VERIFIED WITH: RN S.SNED AT 1233 ON 09/30/2020 BY T.SAAD. Performed at [facility], [place] [address] [place], [place] [address]    Culture VANCOMYCIN RESISTANT ENTEROCOCCUS (A)  Final   Report Status 10/02/2020 FINAL  Final   Organism ID, Bacteria VANCOMYCIN RESISTANT ENTEROCOCCUS  Final      Susceptibility   Vancomycin resistant enterococcus - MIC*    AMPICILLIN <=2 SENSITIVE Sensitive     VANCOMYCIN >=32 RESISTANT Resistant     GENTAMICIN SYNERGY SENSITIVE Sensitive     LINEZOLID 2 SENSITIVE Sensitive     * VANCOMYCIN RESISTANT ENTEROCOCCUS  Blood Culture ID Panel (Reflexed)     Status: Abnormal   Collection Time: 09/29/20  1:07 PM  Result Value Ref Range Status   Enterococcus faecalis DETECTED (A) NOT DETECTED Final    Comment: CRITICAL RESULT CALLED TO, READ BACK BY AND VERIFIED WITH: RN S.SNED AT 1233 ON 09/30/2020 BY T.SAAD.    Enterococcus Faecium NOT DETECTED NOT DETECTED Final   Listeria monocytogenes NOT DETECTED NOT DETECTED Final   Staphylococcus species NOT DETECTED NOT DETECTED Final   Staphylococcus aureus (BCID) NOT DETECTED NOT DETECTED Final   Staphylococcus epidermidis NOT DETECTED NOT DETECTED Final   Staphylococcus  lugdunensis NOT DETECTED NOT DETECTED Final   Streptococcus species NOT DETECTED NOT DETECTED Final   Streptococcus agalactiae NOT DETECTED NOT DETECTED Final   Streptococcus pneumoniae NOT DETECTED NOT DETECTED Final   Streptococcus pyogenes NOT DETECTED NOT DETECTED Final   A.calcoaceticus-baumannii NOT DETECTED NOT DETECTED Final   Bacteroides fragilis NOT DETECTED NOT DETECTED Final    Enterobacterales NOT DETECTED NOT DETECTED Final   Enterobacter cloacae complex NOT DETECTED NOT DETECTED Final   Escherichia coli NOT DETECTED NOT DETECTED Final   Klebsiella aerogenes NOT DETECTED NOT DETECTED Final   Klebsiella oxytoca NOT DETECTED NOT DETECTED Final   Klebsiella pneumoniae NOT DETECTED NOT DETECTED Final   Proteus species NOT DETECTED NOT DETECTED Final   Salmonella species NOT DETECTED NOT DETECTED Final   Serratia marcescens NOT DETECTED NOT DETECTED Final   Haemophilus influenzae NOT DETECTED NOT DETECTED Final   Neisseria meningitidis NOT DETECTED NOT DETECTED Final   Pseudomonas aeruginosa NOT DETECTED NOT DETECTED Final   Stenotrophomonas maltophilia NOT DETECTED NOT DETECTED Final   Candida albicans NOT DETECTED NOT DETECTED Final   Candida auris NOT DETECTED NOT DETECTED Final   Candida glabrata NOT DETECTED NOT DETECTED Final   Candida krusei NOT DETECTED NOT DETECTED Final   Candida parapsilosis NOT DETECTED NOT DETECTED Final   Candida tropicalis NOT DETECTED NOT DETECTED Final   Cryptococcus neoformans/gattii NOT DETECTED NOT DETECTED Final   Vancomycin resistance DETECTED (A) NOT DETECTED Final    Comment: CRITICAL RESULT CALLED TO, READ BACK BY AND VERIFIED WITH: RN S.SNED AT 1233 ON 09/30/2020 BY T.SAAD. Performed at [name], [place] [address], [place], [place] [address]   Culture, blood (routine x 2)     Status: None   Collection Time: 09/29/20  1:14 PM   Specimen: BLOOD RIGHT ARM  Result Value Ref Range Status   Specimen Description BLOOD RIGHT ARM  Final   Special Requests   Final    BOTTLES DRAWN AEROBIC AND ANAEROBIC Blood Culture adequate volume   Culture   Final    NO GROWTH 5 DAYS Performed at [facility], [place] [address] [place], [place] [address]    Report Status 10/04/2020 FINAL  Final  Culture, blood (routine x 2)     Status: None   Collection Time: 10/20/20  9:57 AM   Specimen: BLOOD RIGHT HAND  Result Value Ref  Range Status   Specimen Description BLOOD RIGHT HAND  Final   Special Requests   Final    BOTTLES DRAWN AEROBIC ONLY Blood Culture adequate volume   Culture   Final    NO GROWTH 5 DAYS Performed at [facility], [place] [address], [place], [place] [address]    Report Status 10/25/2020 FINAL  Final  Culture, blood (routine x 2)     Status: None   Collection Time: 10/20/20  9:57 AM   Specimen: BLOOD RIGHT HAND  Result Value Ref Range Status   Specimen Description BLOOD RIGHT HAND  Final   Special Requests   Final    BOTTLES DRAWN AEROBIC ONLY Blood Culture adequate volume   Culture   Final    NO GROWTH 5 DAYS Performed at [facility], [place] [address], [place],  [address]    Report Status 10/25/2020 FINAL  Final    Coagulation Studies: No results for input(s): LABPROT, INR in the last 72 hours.   Urinalysis: No results for input(s): COLORURINE, LABSPEC, [place], [place], [place], [place], [place], [place],  UROBILINOGEN, NITRITE, LEUKOCYTESUR in the last 72 hours.  Invalid input(s): APPERANCEUR    Imaging: No results found.   Medications:       Assessment/ Plan:  45 y.o. male with a PMHx of ESRD on HD, posterior reversible encephalopathy syndrome, diabetes mellitus type 2, chronic systolic heart failure, anemia of chronic kidney disease, secondary hyperparathyroidism, malignant hypertension, acute respiratory failure, who was admitted to [facility] on 06/29/2020 for ongoing treatment of acute respiratory failure, posterior reversible encephalopathy syndrome, and end-stage renal disease.   1.  ESRD on HD.  Patient seen and evaluated during hemodialysis treatment this AM.  Ultrafiltration target 2 kg.  2.  Anemia of chronic kidney disease.  Hemoglobin currently 8.5.  Maintain the patient on Retacrit 6000 IV with dialysis.  3.  Secondary hyperparathyroidism.  Phosphorus currently 3.5 and acceptable.  4.  Acute respiratory failure.  Tracheostomy  placed 07/17/2020.  Remains on T-piece collar at the moment.  5.  Hypertension.  Inaccurate blood pressures noted in arms due to severe swelling.  Blood pressure this a.m. 155/92.  Patient to be maintained on amlodipine, clonidine, hydralazine, labetalol, and losartan.   Derek Huneycutt 10/31/20228:02 AM

## 2020-11-04 LAB — CBC
HCT: 25.3 % — ABNORMAL LOW (ref 39.0–52.0)
Hemoglobin: 7.4 g/dL — ABNORMAL LOW (ref 13.0–17.0)
MCH: 24.2 pg — ABNORMAL LOW (ref 26.0–34.0)
MCHC: 29.2 g/dL — ABNORMAL LOW (ref 30.0–36.0)
MCV: 82.7 fL (ref 80.0–100.0)
Platelets: 159 10*3/uL (ref 150–400)
RBC: 3.06 MIL/uL — ABNORMAL LOW (ref 4.22–5.81)
RDW: 17 % — ABNORMAL HIGH (ref 11.5–15.5)
WBC: 7 10*3/uL (ref 4.0–10.5)
nRBC: 0 % (ref 0.0–0.2)

## 2020-11-04 LAB — RENAL FUNCTION PANEL
Albumin: <1.5 g/dL — ABNORMAL LOW (ref 3.5–5.0)
Anion gap: 8 (ref 5–15)
BUN: 44 mg/dL — ABNORMAL HIGH (ref 6–20)
CO2: 25 mmol/L (ref 22–32)
Calcium: 8.2 mg/dL — ABNORMAL LOW (ref 8.9–10.3)
Chloride: 99 mmol/L (ref 98–111)
Creatinine, Ser: 2.63 mg/dL — ABNORMAL HIGH (ref 0.61–1.24)
GFR, Estimated: 29 mL/min — ABNORMAL LOW (ref >60)
Glucose, Bld: 122 mg/dL — ABNORMAL HIGH (ref 70–99)
Phosphorus: 3.6 mg/dL (ref 2.5–4.6)
Potassium: 4.1 mmol/L (ref 3.5–5.1)
Sodium: 132 mmol/L — ABNORMAL LOW (ref 135–145)

## 2020-11-04 NOTE — Progress Notes (Signed)
[facility]  ROUNDING NOTE   Subjective:  Patient sitting up in chair at the moment. Due for hemodialysis treatment today. Awake but not following any commands.  Temperature 98.6 pulse 58 respirations 18 blood pressure 146/93   Objective:  Vital signs in last 24 hours:  Temperature 96.6 pulse 62 respirations 20 blood pressure 155/92  Physical Exam: General:  No acute distress  Head:  Normocephalic, atraumatic.    Eyes:  Anicteric  Neck:  Tracheostomy in place  Lungs:   Scattered rhonchi, normal effort  Heart:  S1S2 no rubs  Abdomen:   Soft, nontender, bowel sounds present  Extremities:  3+ bilateral upper extremity edema, pedal edema noted  Neurologic:  Bilateral hand contractures, not following commands  Skin:  No acute skin rash  Access:  Left upper extremity AV fistula    Basic Metabolic Panel: Recent Labs  Lab 10/30/20 0450 11/02/20 0413 11/04/20 0446  NA 131* 131* 132*  K 4.0 4.0 4.1  CL 96* 98 99  CO2 26 27 25   GLUCOSE 157* 173* 122*  BUN 39* 49* 44*  CREATININE 2.55* 2.81* 2.63*  CALCIUM 8.4* 8.5* 8.2*  PHOS 3.0 3.5 3.6     Liver Function Tests: Recent Labs  Lab 10/30/20 0450 11/02/20 0413 11/04/20 0446  ALBUMIN <1.5* 1.5* <1.5*    No results for input(s): LIPASE, AMYLASE in the last 168 hours. No results for input(s): AMMONIA in the last 168 hours.  CBC: Recent Labs  Lab 10/30/20 0450 11/02/20 0413 11/04/20 0446  WBC 8.5 9.6 7.0  HGB 8.7* 8.5* 7.4*  HCT 30.0* 28.8* 25.3*  MCV 83.1 83.2 82.7  PLT 146* 174 159     Cardiac Enzymes: No results for input(s): CKTOTAL, CKMB, CKMBINDEX, TROPONINI in the last 168 hours.  BNP: Invalid input(s): POCBNP  CBG: No results for input(s): GLUCAP in the last 168 hours.  Microbiology: Results for orders placed or performed during the hospital encounter of 07/17/20  Culture, blood (routine x 2)     Status: None   Collection Time: 07/27/20 11:17 AM   Specimen: BLOOD RIGHT ARM   Result Value Ref Range Status   Specimen Description BLOOD RIGHT ARM  Final   Special Requests   Final    BOTTLES DRAWN AEROBIC ONLY Blood Culture results may not be optimal due to an inadequate volume of blood received in culture bottles   Culture   Final    NO GROWTH 5 DAYS Performed at [facility], [place] [address], [place], [place] [address]    Report Status 08/01/2020 FINAL  Final  Culture, blood (routine x 2)     Status: None   Collection Time: 07/27/20 11:23 AM   Specimen: BLOOD RIGHT HAND  Result Value Ref Range Status   Specimen Description BLOOD RIGHT HAND  Final   Special Requests   Final    BOTTLES DRAWN AEROBIC ONLY Blood Culture results may not be optimal due to an inadequate volume of blood received in culture bottles   Culture   Final    NO GROWTH 5 DAYS Performed at [facility], [place] [address], [place], [place] [address]    Report Status 08/01/2020 FINAL  Final  Culture, blood (routine x 2)     Status: None   Collection Time: 08/01/20  2:25 PM   Specimen: BLOOD RIGHT HAND  Result Value Ref Range Status   Specimen Description BLOOD RIGHT HAND  Final   Special Requests   Final  BOTTLES DRAWN AEROBIC AND ANAEROBIC Blood Culture results may not be optimal due to an inadequate volume of blood received in culture bottles   Culture   Final    NO GROWTH 5 DAYS Performed at [facility], [place] [address] [place], [place] [address]    Report Status 08/06/2020 FINAL  Final  Culture, blood (routine x 2)     Status: None   Collection Time: 08/01/20  2:39 PM   Specimen: BLOOD RIGHT ARM  Result Value Ref Range Status   Specimen Description BLOOD RIGHT ARM  Final   Special Requests   Final    BOTTLES DRAWN AEROBIC ONLY Blood Culture results may not be optimal due to an inadequate volume of blood received in culture bottles   Culture   Final    NO GROWTH 5 DAYS Performed at [facility], [place] [address], [place], [place] [address]    Report Status  08/06/2020 FINAL  Final  Culture, Respiratory w Gram Stain     Status: None   Collection Time: 08/01/20  2:55 PM   Specimen: Tracheal Aspirate; Respiratory  Result Value Ref Range Status   Specimen Description TRACHEAL ASPIRATE  Final   Special Requests NONE  Final   Gram Stain   Final    FEW WBC PRESENT, PREDOMINANTLY PMN MODERATE GRAM NEGATIVE RODS Performed at [facility], [place] [address], [place], [place] [address]    Culture ABUNDANT PSEUDOMONAS AERUGINOSA  Final   Report Status 08/04/2020 FINAL  Final   Organism ID, Bacteria PSEUDOMONAS AERUGINOSA  Final      Susceptibility   Pseudomonas aeruginosa - MIC*    CEFTAZIDIME <=1 SENSITIVE Sensitive     CIPROFLOXACIN <=0.25 SENSITIVE Sensitive     GENTAMICIN 8 INTERMEDIATE Intermediate     IMIPENEM >=16 RESISTANT Resistant     PIP/TAZO <=4 SENSITIVE Sensitive     CEFEPIME 2 SENSITIVE Sensitive     * ABUNDANT PSEUDOMONAS AERUGINOSA  Culture, Respiratory w Gram Stain     Status: None   Collection Time: 09/17/20  5:21 PM   Specimen: Tracheal Aspirate; Respiratory  Result Value Ref Range Status   Specimen Description TRACHEAL ASPIRATE  Final   Special Requests NONE  Final   Gram Stain   Final    ABUNDANT WBC PRESENT,BOTH PMN AND MONONUCLEAR MODERATE SQUAMOUS EPITHELIAL CELLS PRESENT MODERATE GRAM POSITIVE COCCI MODERATE GRAM NEGATIVE RODS Performed at [facility], [place] [address] [place], [place] [address]    Culture FEW PSEUDOMONAS AERUGINOSA  Final   Report Status 09/22/2020 FINAL  Final   Organism ID, Bacteria PSEUDOMONAS AERUGINOSA  Final      Susceptibility   Pseudomonas aeruginosa - MIC*    CEFTAZIDIME 2 SENSITIVE Sensitive     CIPROFLOXACIN 1 SENSITIVE Sensitive     GENTAMICIN >=16 RESISTANT Resistant     IMIPENEM >=16 RESISTANT Resistant     PIP/TAZO <=4 SENSITIVE Sensitive     * FEW PSEUDOMONAS AERUGINOSA  Culture, blood (routine x 2)     Status: None   Collection Time: 09/18/20 12:05 PM   Specimen:  BLOOD RIGHT ARM  Result Value Ref Range Status   Specimen Description BLOOD RIGHT ARM  Final   Special Requests   Final    BOTTLES DRAWN AEROBIC AND ANAEROBIC Blood Culture adequate volume   Culture   Final    NO GROWTH 5 DAYS Performed at [facility], [address], [place], [place] [address]  Report Status 09/23/2020 FINAL  Final  Culture, blood (routine x 2)     Status: None   Collection Time: 09/18/20 12:05 PM   Specimen: BLOOD RIGHT HAND  Result Value Ref Range Status   Specimen Description BLOOD RIGHT HAND  Final   Special Requests   Final    BOTTLES DRAWN AEROBIC AND ANAEROBIC Blood Culture adequate volume   Culture   Final    NO GROWTH 5 DAYS Performed at [facility], [place] [address] [place], [place] [address]    Report Status 09/23/2020 FINAL  Final  Culture, Respiratory w Gram Stain     Status: None   Collection Time: 09/29/20 12:06 PM   Specimen: Tracheal Aspirate; Respiratory  Result Value Ref Range Status   Specimen Description TRACHEAL ASPIRATE  Final   Special Requests NONE  Final   Gram Stain   Final    RARE SQUAMOUS EPITHELIAL CELLS PRESENT ABUNDANT WBC PRESENT,BOTH PMN AND MONONUCLEAR ABUNDANT GRAM NEGATIVE RODS    Culture   Final    FEW PSEUDOMONAS AERUGINOSA MULTI-DRUG RESISTANT ORGANISM CRITICAL RESULT CALLED TO, READ BACK BY AND VERIFIED WITH: Dennard RN @1145  10/03/20 EB Performed at [facility], [place] [address], [place], [place] [address]    Report Status 10/03/2020 FINAL  Final   Organism ID, Bacteria PSEUDOMONAS AERUGINOSA  Final      Susceptibility   Pseudomonas aeruginosa - MIC*    CEFTAZIDIME 16 INTERMEDIATE Intermediate     CIPROFLOXACIN 0.5 SENSITIVE Sensitive     GENTAMICIN >=16 RESISTANT Resistant     IMIPENEM >=16 RESISTANT Resistant     * FEW PSEUDOMONAS AERUGINOSA  Culture, blood (routine x 2)     Status: Abnormal   Collection Time: 09/29/20  1:07 PM   Specimen: BLOOD RIGHT ARM  Result Value Ref Range Status    Specimen Description BLOOD RIGHT ARM  Final   Special Requests   Final    BOTTLES DRAWN AEROBIC AND ANAEROBIC Blood Culture adequate volume   Culture  Setup Time   Final    GRAM POSITIVE COCCI IN CHAINS AEROBIC BOTTLE ONLY CRITICAL RESULT CALLED TO, READ BACK BY AND VERIFIED WITH: RN S.SNED AT 1233 ON 09/30/2020 BY T.SAAD. Performed at [facility], [place] [address] [place], [place] [address]    Culture VANCOMYCIN RESISTANT ENTEROCOCCUS (A)  Final   Report Status 10/02/2020 FINAL  Final   Organism ID, Bacteria VANCOMYCIN RESISTANT ENTEROCOCCUS  Final      Susceptibility   Vancomycin resistant enterococcus - MIC*    AMPICILLIN <=2 SENSITIVE Sensitive     VANCOMYCIN >=32 RESISTANT Resistant     GENTAMICIN SYNERGY SENSITIVE Sensitive     LINEZOLID 2 SENSITIVE Sensitive     * VANCOMYCIN RESISTANT ENTEROCOCCUS  Blood Culture ID Panel (Reflexed)     Status: Abnormal   Collection Time: 09/29/20  1:07 PM  Result Value Ref Range Status   Enterococcus faecalis DETECTED (A) NOT DETECTED Final    Comment: CRITICAL RESULT CALLED TO, READ BACK BY AND VERIFIED WITH: RN S.SNED AT 1233 ON 09/30/2020 BY T.SAAD.    Enterococcus Faecium NOT DETECTED NOT DETECTED Final   Listeria monocytogenes NOT DETECTED NOT DETECTED Final   Staphylococcus species NOT DETECTED NOT DETECTED Final   Staphylococcus aureus (BCID) NOT DETECTED NOT DETECTED Final   Staphylococcus epidermidis NOT DETECTED NOT DETECTED Final   Staphylococcus lugdunensis NOT DETECTED NOT DETECTED Final   Streptococcus species NOT DETECTED NOT DETECTED Final  Streptococcus agalactiae NOT DETECTED NOT DETECTED Final   Streptococcus pneumoniae NOT DETECTED NOT DETECTED Final   Streptococcus pyogenes NOT DETECTED NOT DETECTED Final   A.calcoaceticus-baumannii NOT DETECTED NOT DETECTED Final   Bacteroides fragilis NOT DETECTED NOT DETECTED Final   Enterobacterales NOT DETECTED NOT DETECTED Final   Enterobacter cloacae complex NOT  DETECTED NOT DETECTED Final   Escherichia coli NOT DETECTED NOT DETECTED Final   Klebsiella aerogenes NOT DETECTED NOT DETECTED Final   Klebsiella oxytoca NOT DETECTED NOT DETECTED Final   Klebsiella pneumoniae NOT DETECTED NOT DETECTED Final   Proteus species NOT DETECTED NOT DETECTED Final   Salmonella species NOT DETECTED NOT DETECTED Final   Serratia marcescens NOT DETECTED NOT DETECTED Final   Haemophilus influenzae NOT DETECTED NOT DETECTED Final   Neisseria meningitidis NOT DETECTED NOT DETECTED Final   Pseudomonas aeruginosa NOT DETECTED NOT DETECTED Final   Stenotrophomonas maltophilia NOT DETECTED NOT DETECTED Final   Candida albicans NOT DETECTED NOT DETECTED Final   Candida auris NOT DETECTED NOT DETECTED Final   Candida glabrata NOT DETECTED NOT DETECTED Final   Candida krusei NOT DETECTED NOT DETECTED Final   Candida parapsilosis NOT DETECTED NOT DETECTED Final   Candida tropicalis NOT DETECTED NOT DETECTED Final   Cryptococcus neoformans/gattii NOT DETECTED NOT DETECTED Final   Vancomycin resistance DETECTED (A) NOT DETECTED Final    Comment: CRITICAL RESULT CALLED TO, READ BACK BY AND VERIFIED WITH: RN S.SNED AT 1233 ON 09/30/2020 BY T.SAAD. Performed at [facility], [place] [address] [place], [place] [address]   Culture, blood (routine x 2)     Status: None   Collection Time: 09/29/20  1:14 PM   Specimen: BLOOD RIGHT ARM  Result Value Ref Range Status   Specimen Description BLOOD RIGHT ARM  Final   Special Requests   Final    BOTTLES DRAWN AEROBIC AND ANAEROBIC Blood Culture adequate volume   Culture   Final    NO GROWTH 5 DAYS Performed at [facility], [place] [address] [place], [place] [address]    Report Status 10/04/2020 FINAL  Final  Culture, blood (routine x 2)     Status: None   Collection Time: 10/20/20  9:57 AM   Specimen: BLOOD RIGHT HAND  Result Value Ref Range Status   Specimen Description BLOOD RIGHT HAND  Final   Special Requests    Final    BOTTLES DRAWN AEROBIC ONLY Blood Culture adequate volume   Culture   Final    NO GROWTH 5 DAYS Performed at [facility], [place] [address], [place], [place] [address]    Report Status 10/25/2020 FINAL  Final  Culture, blood (routine x 2)     Status: None   Collection Time: 10/20/20  9:57 AM   Specimen: BLOOD RIGHT HAND  Result Value Ref Range Status   Specimen Description BLOOD RIGHT HAND  Final   Special Requests   Final    BOTTLES DRAWN AEROBIC ONLY Blood Culture adequate volume   Culture   Final    NO GROWTH 5 DAYS Performed at [facility], [place] [address] [place], [place] [address]    Report Status 10/25/2020 FINAL  Final    Coagulation Studies: No results for input(s): LABPROT, INR in the last 72 hours.   Urinalysis: No results for input(s): COLORURINE, LABSPEC, PHURINE, GLUCOSEU, HGBUR, BILIRUBINUR, KETONESUR, PROTEINUR, UROBILINOGEN, NITRITE, LEUKOCYTESUR in the last 72 hours.  Invalid input(s): APPERANCEUR    Imaging: No  results found.   Medications:       Assessment/ Plan:  45 y.o. male with a PMHx of ESRD on HD, posterior reversible encephalopathy syndrome, diabetes mellitus type 2, chronic systolic heart failure, anemia of chronic kidney disease, secondary hyperparathyroidism, malignant hypertension, acute respiratory failure, who was admitted to [facility] on 06/29/2020 for ongoing treatment of acute respiratory failure, posterior reversible encephalopathy syndrome, and end-stage renal disease.   1.  ESRD on HD.  Patient due for dialysis treatment today.  Orders have been prepared.  2.  Anemia of chronic kidney disease.  Hemoglobin down a bit to 7.4.  Should come up with ultrafiltration and fluid removal.  Otherwise maintain the patient on Retacrit 6070 with dialysis.  3.  Secondary hyperparathyroidism.  Phosphorus checked today and found to be 3.6 which is within target range.  4.  Acute respiratory failure.  Tracheostomy placed  07/17/2020.  Respiratory status remained stable via T-piece collar currently.  5.  Hypertension.  Blood pressure today 146/93.  Patient to be maintained on amlodipine, clonidine, hydralazine, labetalol, and losartan.   Kristia Jupiter 11/2/202210:56 AM

## 2020-11-05 LAB — BASIC METABOLIC PANEL
Anion gap: 8 (ref 5–15)
BUN: 31 mg/dL — ABNORMAL HIGH (ref 6–20)
CO2: 27 mmol/L (ref 22–32)
Calcium: 8.4 mg/dL — ABNORMAL LOW (ref 8.9–10.3)
Chloride: 100 mmol/L (ref 98–111)
Creatinine, Ser: 2.06 mg/dL — ABNORMAL HIGH (ref 0.61–1.24)
GFR, Estimated: 39 mL/min — ABNORMAL LOW (ref >60)
Glucose, Bld: 177 mg/dL — ABNORMAL HIGH (ref 70–99)
Potassium: 3.8 mmol/L (ref 3.5–5.1)
Sodium: 135 mmol/L (ref 135–145)

## 2020-11-06 LAB — RENAL FUNCTION PANEL
Albumin: <1.5 g/dL — ABNORMAL LOW (ref 3.5–5.0)
Anion gap: 12 (ref 5–15)
BUN: 44 mg/dL — ABNORMAL HIGH (ref 6–20)
CO2: 22 mmol/L (ref 22–32)
Calcium: 8.1 mg/dL — ABNORMAL LOW (ref 8.9–10.3)
Chloride: 98 mmol/L (ref 98–111)
Creatinine, Ser: 2.47 mg/dL — ABNORMAL HIGH (ref 0.61–1.24)
GFR, Estimated: 32 mL/min — ABNORMAL LOW (ref >60)
Glucose, Bld: 150 mg/dL — ABNORMAL HIGH (ref 70–99)
Phosphorus: 3.3 mg/dL (ref 2.5–4.6)
Potassium: 4.9 mmol/L (ref 3.5–5.1)
Sodium: 132 mmol/L — ABNORMAL LOW (ref 135–145)

## 2020-11-06 LAB — CBC
HCT: 24.2 % — ABNORMAL LOW (ref 39.0–52.0)
Hemoglobin: 7.3 g/dL — ABNORMAL LOW (ref 13.0–17.0)
MCH: 24.6 pg — ABNORMAL LOW (ref 26.0–34.0)
MCHC: 30.2 g/dL (ref 30.0–36.0)
MCV: 81.5 fL (ref 80.0–100.0)
Platelets: 152 10*3/uL (ref 150–400)
RBC: 2.97 MIL/uL — ABNORMAL LOW (ref 4.22–5.81)
RDW: 16.7 % — ABNORMAL HIGH (ref 11.5–15.5)
WBC: 6.5 10*3/uL (ref 4.0–10.5)
nRBC: 1.2 % — ABNORMAL HIGH (ref 0.0–0.2)

## 2020-11-06 NOTE — Progress Notes (Signed)
[facility]  ROUNDING NOTE   Subjective:  Patient due for hemodialysis treatment today. As before he is awake but not conversant. Sitting up in a chair at the moment.     Objective:  Vital signs in last 24 hours:  Temperature 96.2 pulse 53 respirations 20 blood pressure 156/81  Physical Exam: General:  No acute distress  Head:  Normocephalic, atraumatic.    Eyes:  Anicteric  Neck:  Tracheostomy in place  Lungs:   Scattered rhonchi, normal effort  Heart:  S1S2 no rubs  Abdomen:   Soft, nontender, bowel sounds present  Extremities:  3+ bilateral upper extremity edema, pedal edema noted  Neurologic:  Bilateral hand contractures, not following commands  Skin:  No acute skin rash  Access:  Left upper extremity AV fistula    Basic Metabolic Panel: Recent Labs  Lab 11/02/20 0413 11/04/20 0446 11/05/20 0436 11/06/20 0540  NA 131* 132* 135 132*  K 4.0 4.1 3.8 4.9  CL 98 99 100 98  CO2 27 25 27 22   GLUCOSE 173* 122* 177* 150*  BUN 49* 44* 31* 44*  CREATININE 2.81* 2.63* 2.06* 2.47*  CALCIUM 8.5* 8.2* 8.4* 8.1*  PHOS 3.5 3.6  --  3.3     Liver Function Tests: Recent Labs  Lab 11/02/20 0413 11/04/20 0446 11/06/20 0540  ALBUMIN 1.5* <1.5* <1.5*    No results for input(s): LIPASE, AMYLASE in the last 168 hours. No results for input(s): AMMONIA in the last 168 hours.  CBC: Recent Labs  Lab 11/02/20 0413 11/04/20 0446 11/06/20 0540  WBC 9.6 7.0 6.5  HGB 8.5* 7.4* 7.3*  HCT 28.8* 25.3* 24.2*  MCV 83.2 82.7 81.5  PLT 174 159 152     Cardiac Enzymes: No results for input(s): CKTOTAL, CKMB, CKMBINDEX, TROPONINI in the last 168 hours.  BNP: Invalid input(s): POCBNP  CBG: No results for input(s): GLUCAP in the last 168 hours.  Microbiology: Results for orders placed or performed during the hospital encounter of 07/17/20  Culture, blood (routine x 2)     Status: None   Collection Time: 07/27/20 11:17 AM   Specimen: BLOOD RIGHT ARM  Result  Value Ref Range Status   Specimen Description BLOOD RIGHT ARM  Final   Special Requests   Final    BOTTLES DRAWN AEROBIC ONLY Blood Culture results may not be optimal due to an inadequate volume of blood received in culture bottles   Culture   Final    NO GROWTH 5 DAYS Performed at [facility], [place] [address], [place], [place] [address]    Report Status 08/01/2020 FINAL  Final  Culture, blood (routine x 2)     Status: None   Collection Time: 07/27/20 11:23 AM   Specimen: BLOOD RIGHT HAND  Result Value Ref Range Status   Specimen Description BLOOD RIGHT HAND  Final   Special Requests   Final    BOTTLES DRAWN AEROBIC ONLY Blood Culture results may not be optimal due to an inadequate volume of blood received in culture bottles   Culture   Final    NO GROWTH 5 DAYS Performed at [facility], [place] [address] [place], [place] [address]    Report Status 08/01/2020 FINAL  Final  Culture, blood (routine x 2)     Status: None   Collection Time: 08/01/20  2:25 PM   Specimen: BLOOD RIGHT HAND  Result Value Ref Range Status   Specimen Description BLOOD RIGHT HAND  Final  Special Requests   Final    BOTTLES DRAWN AEROBIC AND ANAEROBIC Blood Culture results may not be optimal due to an inadequate volume of blood received in culture bottles   Culture   Final    NO GROWTH 5 DAYS Performed at [facility], [place] [address] [place], [place] [address]    Report Status 08/06/2020 FINAL  Final  Culture, blood (routine x 2)     Status: None   Collection Time: 08/01/20  2:39 PM   Specimen: BLOOD RIGHT ARM  Result Value Ref Range Status   Specimen Description BLOOD RIGHT ARM  Final   Special Requests   Final    BOTTLES DRAWN AEROBIC ONLY Blood Culture results may not be optimal due to an inadequate volume of blood received in culture bottles   Culture   Final    NO GROWTH 5 DAYS Performed at [facility], [place] [address] [place], [place] [address]    Report Status  08/06/2020 FINAL  Final  Culture, Respiratory w Gram Stain     Status: None   Collection Time: 08/01/20  2:55 PM   Specimen: Tracheal Aspirate; Respiratory  Result Value Ref Range Status   Specimen Description TRACHEAL ASPIRATE  Final   Special Requests NONE  Final   Gram Stain   Final    FEW WBC PRESENT, PREDOMINANTLY PMN MODERATE GRAM NEGATIVE RODS Performed at [facility], [place] [address] [place], [place] [address]    Culture ABUNDANT PSEUDOMONAS AERUGINOSA  Final   Report Status 08/04/2020 FINAL  Final   Organism ID, Bacteria PSEUDOMONAS AERUGINOSA  Final      Susceptibility   Pseudomonas aeruginosa - MIC*    CEFTAZIDIME <=1 SENSITIVE Sensitive     CIPROFLOXACIN <=0.25 SENSITIVE Sensitive     GENTAMICIN 8 INTERMEDIATE Intermediate     IMIPENEM >=16 RESISTANT Resistant     PIP/TAZO <=4 SENSITIVE Sensitive     CEFEPIME 2 SENSITIVE Sensitive     * ABUNDANT PSEUDOMONAS AERUGINOSA  Culture, Respiratory w Gram Stain     Status: None   Collection Time: 09/17/20  5:21 PM   Specimen: Tracheal Aspirate; Respiratory  Result Value Ref Range Status   Specimen Description TRACHEAL ASPIRATE  Final   Special Requests NONE  Final   Gram Stain   Final    ABUNDANT WBC PRESENT,BOTH PMN AND MONONUCLEAR MODERATE SQUAMOUS EPITHELIAL CELLS PRESENT MODERATE GRAM POSITIVE COCCI MODERATE GRAM NEGATIVE RODS Performed at [facility], [place] [name], [place], [place] [address]    Culture FEW PSEUDOMONAS AERUGINOSA  Final   Report Status 09/22/2020 FINAL  Final   Organism ID, Bacteria PSEUDOMONAS AERUGINOSA  Final      Susceptibility   Pseudomonas aeruginosa - MIC*    CEFTAZIDIME 2 SENSITIVE Sensitive     CIPROFLOXACIN 1 SENSITIVE Sensitive     GENTAMICIN >=16 RESISTANT Resistant     IMIPENEM >=16 RESISTANT Resistant     PIP/TAZO <=4 SENSITIVE Sensitive     * FEW PSEUDOMONAS AERUGINOSA  Culture, blood (routine x 2)     Status: None   Collection Time: 09/18/20 12:05 PM   Specimen:  BLOOD RIGHT ARM  Result Value Ref Range Status   Specimen Description BLOOD RIGHT ARM  Final   Special Requests   Final    BOTTLES DRAWN AEROBIC AND ANAEROBIC Blood Culture adequate volume   Culture   Final    NO GROWTH 5 DAYS Performed at [facility],  [address], [place], [place] [address]    Report Status 09/23/2020 FINAL  Final  Culture, blood (routine x 2)     Status: None   Collection Time: 09/18/20 12:05 PM   Specimen: BLOOD RIGHT HAND  Result Value Ref Range Status   Specimen Description BLOOD RIGHT HAND  Final   Special Requests   Final    BOTTLES DRAWN AEROBIC AND ANAEROBIC Blood Culture adequate volume   Culture   Final    NO GROWTH 5 DAYS Performed at [facility], [place] [address] [place], [place] [address]    Report Status 09/23/2020 FINAL  Final  Culture, Respiratory w Gram Stain     Status: None   Collection Time: 09/29/20 12:06 PM   Specimen: Tracheal Aspirate; Respiratory  Result Value Ref Range Status   Specimen Description TRACHEAL ASPIRATE  Final   Special Requests NONE  Final   Gram Stain   Final    RARE SQUAMOUS EPITHELIAL CELLS PRESENT ABUNDANT WBC PRESENT,BOTH PMN AND MONONUCLEAR ABUNDANT GRAM NEGATIVE RODS    Culture   Final    FEW PSEUDOMONAS AERUGINOSA MULTI-DRUG RESISTANT ORGANISM CRITICAL RESULT CALLED TO, READ BACK BY AND VERIFIED WITH: [place] RN @1145  10/03/20 EB Performed at [facility], [place] [address] [place], [place] [address]    Report Status 10/03/2020 FINAL  Final   Organism ID, Bacteria PSEUDOMONAS AERUGINOSA  Final      Susceptibility   Pseudomonas aeruginosa - MIC*    CEFTAZIDIME 16 INTERMEDIATE Intermediate     CIPROFLOXACIN 0.5 SENSITIVE Sensitive     GENTAMICIN >=16 RESISTANT Resistant     IMIPENEM >=16 RESISTANT Resistant     * FEW PSEUDOMONAS AERUGINOSA  Culture, blood (routine x 2)     Status: Abnormal   Collection Time: 09/29/20  1:07 PM   Specimen: BLOOD RIGHT ARM  Result Value Ref Range Status    Specimen Description BLOOD RIGHT ARM  Final   Special Requests   Final    BOTTLES DRAWN AEROBIC AND ANAEROBIC Blood Culture adequate volume   Culture  Setup Time   Final    GRAM POSITIVE COCCI IN CHAINS AEROBIC BOTTLE ONLY CRITICAL RESULT CALLED TO, READ BACK BY AND VERIFIED WITH: RN S.SNED AT 1233 ON 09/30/2020 BY T.SAAD. Performed at [facility], [place] [address] [place], [place] [address]    Culture VANCOMYCIN RESISTANT ENTEROCOCCUS (A)  Final   Report Status 10/02/2020 FINAL  Final   Organism ID, Bacteria VANCOMYCIN RESISTANT ENTEROCOCCUS  Final      Susceptibility   Vancomycin resistant enterococcus - MIC*    AMPICILLIN <=2 SENSITIVE Sensitive     VANCOMYCIN >=32 RESISTANT Resistant     GENTAMICIN SYNERGY SENSITIVE Sensitive     LINEZOLID 2 SENSITIVE Sensitive     * VANCOMYCIN RESISTANT ENTEROCOCCUS  Blood Culture ID Panel (Reflexed)     Status: Abnormal   Collection Time: 09/29/20  1:07 PM  Result Value Ref Range Status   Enterococcus faecalis DETECTED (A) NOT DETECTED Final    Comment: CRITICAL RESULT CALLED TO, READ BACK BY AND VERIFIED WITH: RN S.SNED AT 1233 ON 09/30/2020 BY T.SAAD.    Enterococcus Faecium NOT DETECTED NOT DETECTED Final   Listeria monocytogenes NOT DETECTED NOT DETECTED Final   Staphylococcus species NOT DETECTED NOT DETECTED Final   Staphylococcus aureus (BCID) NOT DETECTED NOT DETECTED Final   Staphylococcus epidermidis NOT DETECTED NOT DETECTED Final   Staphylococcus lugdunensis NOT DETECTED NOT DETECTED Final  Streptococcus species NOT DETECTED NOT DETECTED Final   Streptococcus agalactiae NOT DETECTED NOT DETECTED Final   Streptococcus pneumoniae NOT DETECTED NOT DETECTED Final   Streptococcus pyogenes NOT DETECTED NOT DETECTED Final   A.calcoaceticus-baumannii NOT DETECTED NOT DETECTED Final   Bacteroides fragilis NOT DETECTED NOT DETECTED Final   Enterobacterales NOT DETECTED NOT DETECTED Final   Enterobacter cloacae complex NOT  DETECTED NOT DETECTED Final   Escherichia coli NOT DETECTED NOT DETECTED Final   Klebsiella aerogenes NOT DETECTED NOT DETECTED Final   Klebsiella oxytoca NOT DETECTED NOT DETECTED Final   Klebsiella pneumoniae NOT DETECTED NOT DETECTED Final   Proteus species NOT DETECTED NOT DETECTED Final   Salmonella species NOT DETECTED NOT DETECTED Final   Serratia marcescens NOT DETECTED NOT DETECTED Final   Haemophilus influenzae NOT DETECTED NOT DETECTED Final   Neisseria meningitidis NOT DETECTED NOT DETECTED Final   Pseudomonas aeruginosa NOT DETECTED NOT DETECTED Final   Stenotrophomonas maltophilia NOT DETECTED NOT DETECTED Final   Candida albicans NOT DETECTED NOT DETECTED Final   Candida auris NOT DETECTED NOT DETECTED Final   Candida glabrata NOT DETECTED NOT DETECTED Final   Candida krusei NOT DETECTED NOT DETECTED Final   Candida parapsilosis NOT DETECTED NOT DETECTED Final   Candida tropicalis NOT DETECTED NOT DETECTED Final   Cryptococcus neoformans/gattii NOT DETECTED NOT DETECTED Final   Vancomycin resistance DETECTED (A) NOT DETECTED Final    Comment: CRITICAL RESULT CALLED TO, READ BACK BY AND VERIFIED WITH: RN S.SNED AT 1233 ON 09/30/2020 BY T.SAAD. Performed at [facility], [place] [address], [place], [place] [address]   Culture, blood (routine x 2)     Status: None   Collection Time: 09/29/20  1:14 PM   Specimen: BLOOD RIGHT ARM  Result Value Ref Range Status   Specimen Description BLOOD RIGHT ARM  Final   Special Requests   Final    BOTTLES DRAWN AEROBIC AND ANAEROBIC Blood Culture adequate volume   Culture   Final    NO GROWTH 5 DAYS Performed at [facility], [place] [address], [place], [place] [address]    Report Status 10/04/2020 FINAL  Final  Culture, blood (routine x 2)     Status: None   Collection Time: 10/20/20  9:57 AM   Specimen: BLOOD RIGHT HAND  Result Value Ref Range Status   Specimen Description BLOOD RIGHT HAND  Final   Special Requests    Final    BOTTLES DRAWN AEROBIC ONLY Blood Culture adequate volume   Culture   Final    NO GROWTH 5 DAYS Performed at [facility], [place] [address] [place], [place] [address]    Report Status 10/25/2020 FINAL  Final  Culture, blood (routine x 2)     Status: None   Collection Time: 10/20/20  9:57 AM   Specimen: BLOOD RIGHT HAND  Result Value Ref Range Status   Specimen Description BLOOD RIGHT HAND  Final   Special Requests   Final    BOTTLES DRAWN AEROBIC ONLY Blood Culture adequate volume   Culture   Final    NO GROWTH 5 DAYS Performed at [facility], [place] [address], [place],  [address]    Report Status 10/25/2020 FINAL  Final    Coagulation Studies: No results for input(s): LABPROT, INR in the last 72 hours.   Urinalysis: No results for input(s): COLORURINE, LABSPEC, PHURINE, GLUCOSEU, HGBUR, BILIRUBINUR, KETONESUR, PROTEINUR, UROBILINOGEN, NITRITE, LEUKOCYTESUR in the last 72 hours.  Invalid input(s): APPERANCEUR    Imaging: No results found.   Medications:       Assessment/ Plan:  45 y.o. male with a PMHx of ESRD on HD, posterior reversible encephalopathy syndrome, diabetes mellitus type 2, chronic systolic heart failure, anemia of chronic kidney disease, secondary hyperparathyroidism, malignant hypertension, acute respiratory failure, who was admitted to [facility] on 06/29/2020 for ongoing treatment of acute respiratory failure, posterior reversible encephalopathy syndrome, and end-stage renal disease.   1.  ESRD on HD.  We plan to maintain the patient on MWF dialysis treatment days therefore he is due for dialysis treatment today.  2.  Anemia of chronic kidney disease.  Hemoglobin remains low at 7.3.  No immediate need for blood transfusion.  Continue Retacrit as prescribed.  3.  Secondary hyperparathyroidism.  Phosphorus remains within target range at 3.3.  4.  Acute respiratory failure.  Tracheostomy placed 07/17/2020.  Respiratory  status remained stable via T-piece collar currently.  5.  Hypertension.  Blood pressure this a.m. was 156/81.  Continue amlodipine, clonidine, hydralazine, labetalol, and losartan.   Jennica Tagliaferri 11/4/20228:07 AM

## 2020-11-09 LAB — CBC
HCT: 25.5 % — ABNORMAL LOW (ref 39.0–52.0)
Hemoglobin: 7.7 g/dL — ABNORMAL LOW (ref 13.0–17.0)
MCH: 24.7 pg — ABNORMAL LOW (ref 26.0–34.0)
MCHC: 30.2 g/dL (ref 30.0–36.0)
MCV: 81.7 fL (ref 80.0–100.0)
Platelets: 147 10*3/uL — ABNORMAL LOW (ref 150–400)
RBC: 3.12 MIL/uL — ABNORMAL LOW (ref 4.22–5.81)
RDW: 17 % — ABNORMAL HIGH (ref 11.5–15.5)
WBC: 6.3 10*3/uL (ref 4.0–10.5)
nRBC: 0 % (ref 0.0–0.2)

## 2020-11-09 LAB — RENAL FUNCTION PANEL
Albumin: 1.6 g/dL — ABNORMAL LOW (ref 3.5–5.0)
Anion gap: 8 (ref 5–15)
BUN: 44 mg/dL — ABNORMAL HIGH (ref 6–20)
CO2: 29 mmol/L (ref 22–32)
Calcium: 8.4 mg/dL — ABNORMAL LOW (ref 8.9–10.3)
Chloride: 96 mmol/L — ABNORMAL LOW (ref 98–111)
Creatinine, Ser: 2.54 mg/dL — ABNORMAL HIGH (ref 0.61–1.24)
GFR, Estimated: 31 mL/min — ABNORMAL LOW (ref >60)
Glucose, Bld: 190 mg/dL — ABNORMAL HIGH (ref 70–99)
Phosphorus: 3.3 mg/dL (ref 2.5–4.6)
Potassium: 4.2 mmol/L (ref 3.5–5.1)
Sodium: 133 mmol/L — ABNORMAL LOW (ref 135–145)

## 2020-11-09 NOTE — Progress Notes (Signed)
[facility]  ROUNDING NOTE   Subjective:  Patient laying in bed this AM. Had turned and deviated towards the left. Due for hemodialysis treatment today.   Objective:  Vital signs in last 24 hours:  Temperature 97.4 pulse 55 respirations 18 blood pressure 189/93  Physical Exam: General:  No acute distress  Head:  Normocephalic, atraumatic.    Eyes:  Anicteric  Neck:  Tracheostomy in place  Lungs:   Scattered rhonchi, normal effort  Heart:  S1S2 no rubs  Abdomen:   Soft, nontender, bowel sounds present  Extremities:  3+ bilateral upper extremity edema, pedal edema noted  Neurologic:  Bilateral hand contractures, not following commands  Skin:  No acute skin rash  Access:  Left upper extremity AV fistula    Basic Metabolic Panel: Recent Labs  Lab 11/04/20 0446 11/05/20 0436 11/06/20 0540 11/09/20 0557  NA 132* 135 132* 133*  K 4.1 3.8 4.9 4.2  CL 99 100 98 96*  CO2 25 27 22 29   GLUCOSE 122* 177* 150* 190*  BUN 44* 31* 44* 44*  CREATININE 2.63* 2.06* 2.47* 2.54*  CALCIUM 8.2* 8.4* 8.1* 8.4*  PHOS 3.6  --  3.3 3.3     Liver Function Tests: Recent Labs  Lab 11/04/20 0446 11/06/20 0540 11/09/20 0557  ALBUMIN <1.5* <1.5* 1.6*    No results for input(s): LIPASE, AMYLASE in the last 168 hours. No results for input(s): AMMONIA in the last 168 hours.  CBC: Recent Labs  Lab 11/04/20 0446 11/06/20 0540 11/09/20 0557  WBC 7.0 6.5 6.3  HGB 7.4* 7.3* 7.7*  HCT 25.3* 24.2* 25.5*  MCV 82.7 81.5 81.7  PLT 159 152 147*     Cardiac Enzymes: No results for input(s): CKTOTAL, CKMB, CKMBINDEX, TROPONINI in the last 168 hours.  BNP: Invalid input(s): POCBNP  CBG: No results for input(s): GLUCAP in the last 168 hours.  Microbiology: Results for orders placed or performed during the hospital encounter of 07/17/20  Culture, blood (routine x 2)     Status: None   Collection Time: 07/27/20 11:17 AM   Specimen: BLOOD RIGHT ARM  Result Value Ref Range  Status   Specimen Description BLOOD RIGHT ARM  Final   Special Requests   Final    BOTTLES DRAWN AEROBIC ONLY Blood Culture results may not be optimal due to an inadequate volume of blood received in culture bottles   Culture   Final    NO GROWTH 5 DAYS Performed at [facility], [place] [address] [place], [place] [address]    Report Status 08/01/2020 FINAL  Final  Culture, blood (routine x 2)     Status: None   Collection Time: 07/27/20 11:23 AM   Specimen: BLOOD RIGHT HAND  Result Value Ref Range Status   Specimen Description BLOOD RIGHT HAND  Final   Special Requests   Final    BOTTLES DRAWN AEROBIC ONLY Blood Culture results may not be optimal due to an inadequate volume of blood received in culture bottles   Culture   Final    NO GROWTH 5 DAYS Performed at [facility], [place] [address], [place], [place] [address]    Report Status 08/01/2020 FINAL  Final  Culture, blood (routine x 2)     Status: None   Collection Time: 08/01/20  2:25 PM   Specimen: BLOOD RIGHT HAND  Result Value Ref Range Status   Specimen Description BLOOD RIGHT HAND  Final   Special Requests  Final    BOTTLES DRAWN AEROBIC AND ANAEROBIC Blood Culture results may not be optimal due to an inadequate volume of blood received in culture bottles   Culture   Final    NO GROWTH 5 DAYS Performed at [facility], [place] [address], [place], [place] [address]    Report Status 08/06/2020 FINAL  Final  Culture, blood (routine x 2)     Status: None   Collection Time: 08/01/20  2:39 PM   Specimen: BLOOD RIGHT ARM  Result Value Ref Range Status   Specimen Description BLOOD RIGHT ARM  Final   Special Requests   Final    BOTTLES DRAWN AEROBIC ONLY Blood Culture results may not be optimal due to an inadequate volume of blood received in culture bottles   Culture   Final    NO GROWTH 5 DAYS Performed at [facility], [place] [address] [place], [place] [address]    Report Status 08/06/2020 FINAL  Final   Culture, Respiratory w Gram Stain     Status: None   Collection Time: 08/01/20  2:55 PM   Specimen: Tracheal Aspirate; Respiratory  Result Value Ref Range Status   Specimen Description TRACHEAL ASPIRATE  Final   Special Requests NONE  Final   Gram Stain   Final    FEW WBC PRESENT, PREDOMINANTLY PMN MODERATE GRAM NEGATIVE RODS Performed at [facility], [place] [address] [place], [place] [address]    Culture ABUNDANT PSEUDOMONAS AERUGINOSA  Final   Report Status 08/04/2020 FINAL  Final   Organism ID, Bacteria PSEUDOMONAS AERUGINOSA  Final      Susceptibility   Pseudomonas aeruginosa - MIC*    CEFTAZIDIME <=1 SENSITIVE Sensitive     CIPROFLOXACIN <=0.25 SENSITIVE Sensitive     GENTAMICIN 8 INTERMEDIATE Intermediate     IMIPENEM >=16 RESISTANT Resistant     PIP/TAZO <=4 SENSITIVE Sensitive     CEFEPIME 2 SENSITIVE Sensitive     * ABUNDANT PSEUDOMONAS AERUGINOSA  Culture, Respiratory w Gram Stain     Status: None   Collection Time: 09/17/20  5:21 PM   Specimen: Tracheal Aspirate; Respiratory  Result Value Ref Range Status   Specimen Description TRACHEAL ASPIRATE  Final   Special Requests NONE  Final   Gram Stain   Final    ABUNDANT WBC PRESENT,BOTH PMN AND MONONUCLEAR MODERATE SQUAMOUS EPITHELIAL CELLS PRESENT MODERATE GRAM POSITIVE COCCI MODERATE GRAM NEGATIVE RODS Performed at [facility], [place] [address], [place], [place] [address]    Culture FEW PSEUDOMONAS AERUGINOSA  Final   Report Status 09/22/2020 FINAL  Final   Organism ID, Bacteria PSEUDOMONAS AERUGINOSA  Final      Susceptibility   Pseudomonas aeruginosa - MIC*    CEFTAZIDIME 2 SENSITIVE Sensitive     CIPROFLOXACIN 1 SENSITIVE Sensitive     GENTAMICIN >=16 RESISTANT Resistant     IMIPENEM >=16 RESISTANT Resistant     PIP/TAZO <=4 SENSITIVE Sensitive     * FEW PSEUDOMONAS AERUGINOSA  Culture, blood (routine x 2)     Status: None   Collection Time: 09/18/20 12:05 PM   Specimen: BLOOD RIGHT ARM   Result Value Ref Range Status   Specimen Description BLOOD RIGHT ARM  Final   Special Requests   Final    BOTTLES DRAWN AEROBIC AND ANAEROBIC Blood Culture adequate volume   Culture   Final    NO GROWTH 5 DAYS Performed at [facility], [address],  [place], [place] [address]    Report Status 09/23/2020 FINAL  Final  Culture, blood (routine x 2)     Status: None   Collection Time: 09/18/20 12:05 PM   Specimen: BLOOD RIGHT HAND  Result Value Ref Range Status   Specimen Description BLOOD RIGHT HAND  Final   Special Requests   Final    BOTTLES DRAWN AEROBIC AND ANAEROBIC Blood Culture adequate volume   Culture   Final    NO GROWTH 5 DAYS Performed at [facility], [place] [address], [place], [place] [address]    Report Status 09/23/2020 FINAL  Final  Culture, Respiratory w Gram Stain     Status: None   Collection Time: 09/29/20 12:06 PM   Specimen: Tracheal Aspirate; Respiratory  Result Value Ref Range Status   Specimen Description TRACHEAL ASPIRATE  Final   Special Requests NONE  Final   Gram Stain   Final    RARE SQUAMOUS EPITHELIAL CELLS PRESENT ABUNDANT WBC PRESENT,BOTH PMN AND MONONUCLEAR ABUNDANT GRAM NEGATIVE RODS    Culture   Final    FEW PSEUDOMONAS AERUGINOSA MULTI-DRUG RESISTANT ORGANISM CRITICAL RESULT CALLED TO, READ BACK BY AND VERIFIED WITH: [place] RN @1145  10/03/20 EB Performed at [facility], [place] [address], [place], [place] [address]    Report Status 10/03/2020 FINAL  Final   Organism ID, Bacteria PSEUDOMONAS AERUGINOSA  Final      Susceptibility   Pseudomonas aeruginosa - MIC*    CEFTAZIDIME 16 INTERMEDIATE Intermediate     CIPROFLOXACIN 0.5 SENSITIVE Sensitive     GENTAMICIN >=16 RESISTANT Resistant     IMIPENEM >=16 RESISTANT Resistant     * FEW PSEUDOMONAS AERUGINOSA  Culture, blood (routine x 2)     Status: Abnormal   Collection Time: 09/29/20  1:07 PM   Specimen: BLOOD RIGHT ARM  Result Value Ref Range Status   Specimen  Description BLOOD RIGHT ARM  Final   Special Requests   Final    BOTTLES DRAWN AEROBIC AND ANAEROBIC Blood Culture adequate volume   Culture  Setup Time   Final    GRAM POSITIVE COCCI IN CHAINS AEROBIC BOTTLE ONLY CRITICAL RESULT CALLED TO, READ BACK BY AND VERIFIED WITH: RN S.SNED AT 1233 ON 09/30/2020 BY T.SAAD. Performed at [facility], [place] [address] [place],  [address]    Culture VANCOMYCIN RESISTANT ENTEROCOCCUS (A)  Final   Report Status 10/02/2020 FINAL  Final   Organism ID, Bacteria VANCOMYCIN RESISTANT ENTEROCOCCUS  Final      Susceptibility   Vancomycin resistant enterococcus - MIC*    AMPICILLIN <=2 SENSITIVE Sensitive     VANCOMYCIN >=32 RESISTANT Resistant     GENTAMICIN SYNERGY SENSITIVE Sensitive     LINEZOLID 2 SENSITIVE Sensitive     * VANCOMYCIN RESISTANT ENTEROCOCCUS  Blood Culture ID Panel (Reflexed)     Status: Abnormal   Collection Time: 09/29/20  1:07 PM  Result Value Ref Range Status   Enterococcus faecalis DETECTED (A) NOT DETECTED Final    Comment: CRITICAL RESULT CALLED TO, READ BACK BY AND VERIFIED WITH: RN S.SNED AT 1233 ON 09/30/2020 BY T.SAAD.    Enterococcus Faecium NOT DETECTED NOT DETECTED Final   Listeria monocytogenes NOT DETECTED NOT DETECTED Final   Staphylococcus species NOT DETECTED NOT DETECTED Final   Staphylococcus aureus (BCID) NOT DETECTED NOT DETECTED Final   Staphylococcus epidermidis NOT DETECTED NOT DETECTED Final   Staphylococcus lugdunensis NOT DETECTED NOT DETECTED Final   Streptococcus species NOT  DETECTED NOT DETECTED Final   Streptococcus agalactiae NOT DETECTED NOT DETECTED Final   Streptococcus pneumoniae NOT DETECTED NOT DETECTED Final   Streptococcus pyogenes NOT DETECTED NOT DETECTED Final   A.calcoaceticus-baumannii NOT DETECTED NOT DETECTED Final   Bacteroides fragilis NOT DETECTED NOT DETECTED Final   Enterobacterales NOT DETECTED NOT DETECTED Final   Enterobacter cloacae complex NOT DETECTED NOT  DETECTED Final   Escherichia coli NOT DETECTED NOT DETECTED Final   Klebsiella aerogenes NOT DETECTED NOT DETECTED Final   Klebsiella oxytoca NOT DETECTED NOT DETECTED Final   Klebsiella pneumoniae NOT DETECTED NOT DETECTED Final   Proteus species NOT DETECTED NOT DETECTED Final   Salmonella species NOT DETECTED NOT DETECTED Final   Serratia marcescens NOT DETECTED NOT DETECTED Final   Haemophilus influenzae NOT DETECTED NOT DETECTED Final   Neisseria meningitidis NOT DETECTED NOT DETECTED Final   Pseudomonas aeruginosa NOT DETECTED NOT DETECTED Final   Stenotrophomonas maltophilia NOT DETECTED NOT DETECTED Final   Candida albicans NOT DETECTED NOT DETECTED Final   Candida auris NOT DETECTED NOT DETECTED Final   Candida glabrata NOT DETECTED NOT DETECTED Final   Candida krusei NOT DETECTED NOT DETECTED Final   Candida parapsilosis NOT DETECTED NOT DETECTED Final   Candida tropicalis NOT DETECTED NOT DETECTED Final   Cryptococcus neoformans/gattii NOT DETECTED NOT DETECTED Final   Vancomycin resistance DETECTED (A) NOT DETECTED Final    Comment: CRITICAL RESULT CALLED TO, READ BACK BY AND VERIFIED WITH: RN S.SNED AT 1233 ON 09/30/2020 BY T.SAAD. Performed at [facility], [place] [address], [place], [place] [address]   Culture, blood (routine x 2)     Status: None   Collection Time: 09/29/20  1:14 PM   Specimen: BLOOD RIGHT ARM  Result Value Ref Range Status   Specimen Description BLOOD RIGHT ARM  Final   Special Requests   Final    BOTTLES DRAWN AEROBIC AND ANAEROBIC Blood Culture adequate volume   Culture   Final    NO GROWTH 5 DAYS Performed at [facility], [place] [address], [place], [place] [address]    Report Status 10/04/2020 FINAL  Final  Culture, blood (routine x 2)     Status: None   Collection Time: 10/20/20  9:57 AM   Specimen: BLOOD RIGHT HAND  Result Value Ref Range Status   Specimen Description BLOOD RIGHT HAND  Final   Special Requests   Final     BOTTLES DRAWN AEROBIC ONLY Blood Culture adequate volume   Culture   Final    NO GROWTH 5 DAYS Performed at [facility], [place] [address], [place], [place] [address]    Report Status 10/25/2020 FINAL  Final  Culture, blood (routine x 2)     Status: None   Collection Time: 10/20/20  9:57 AM   Specimen: BLOOD RIGHT HAND  Result Value Ref Range Status   Specimen Description BLOOD RIGHT HAND  Final   Special Requests   Final    BOTTLES DRAWN AEROBIC ONLY Blood Culture adequate volume   Culture   Final    NO GROWTH 5 DAYS Performed at [facility], [place] [address], [place], [place] [address]    Report Status 10/25/2020 FINAL  Final    Coagulation Studies: No results for input(s): LABPROT, INR in the last 72 hours.   Urinalysis: No results for input(s): COLORURINE, LABSPEC, PHURINE, GLUCOSEU, HGBUR, BILIRUBINUR, KETONESUR, PROTEINUR, UROBILINOGEN, NITRITE, LEUKOCYTESUR in the last 72 hours.  Invalid input(s):  APPERANCEUR    Imaging: No results found.   Medications:       Assessment/ Plan:  45 y.o. male with a PMHx of ESRD on HD, posterior reversible encephalopathy syndrome, diabetes mellitus type 2, chronic systolic heart failure, anemia of chronic kidney disease, secondary hyperparathyroidism, malignant hypertension, acute respiratory failure, who was admitted to [facility] on 06/29/2020 for ongoing treatment of acute respiratory failure, posterior reversible encephalopathy syndrome, and end-stage renal disease.   1.  ESRD on HD.  Patient due for hemodialysis treatment today.  Maintain the patient on MWF schedule.  2.  Anemia of chronic kidney disease.  Hemoglobin up slightly to 7.7.  Maintain the patient on Retacrit.  3.  Secondary hyperparathyroidism.  Phosphorus stable at 3.3.  Continue to monitor bone mineral metabolism parameters.  4.  Acute respiratory failure.  Tracheostomy placed 07/17/2020.  Respiratory status remained stable via T-piece collar  currently.  5.  Hypertension.  Blood pressure continues to be labile.  Accurate blood pressure readings in question due to the extensive upper extremity edema.  Continue amlodipine, clonidine, hydralazine, labetalol, and losartan.   Jeffery Andrews 11/7/20228:07 AM

## 2020-11-10 ENCOUNTER — Other Ambulatory Visit (HOSPITAL_COMMUNITY): Payer: Medicare Other

## 2020-11-11 LAB — RENAL FUNCTION PANEL
Albumin: 1.6 g/dL — ABNORMAL LOW (ref 3.5–5.0)
Anion gap: 9 (ref 5–15)
BUN: 38 mg/dL — ABNORMAL HIGH (ref 6–20)
CO2: 28 mmol/L (ref 22–32)
Calcium: 8.2 mg/dL — ABNORMAL LOW (ref 8.9–10.3)
Chloride: 95 mmol/L — ABNORMAL LOW (ref 98–111)
Creatinine, Ser: 2.49 mg/dL — ABNORMAL HIGH (ref 0.61–1.24)
GFR, Estimated: 31 mL/min — ABNORMAL LOW (ref >60)
Glucose, Bld: 167 mg/dL — ABNORMAL HIGH (ref 70–99)
Phosphorus: 3 mg/dL (ref 2.5–4.6)
Potassium: 3.7 mmol/L (ref 3.5–5.1)
Sodium: 132 mmol/L — ABNORMAL LOW (ref 135–145)

## 2020-11-11 LAB — CBC
HCT: 24.7 % — ABNORMAL LOW (ref 39.0–52.0)
Hemoglobin: 7.4 g/dL — ABNORMAL LOW (ref 13.0–17.0)
MCH: 24.7 pg — ABNORMAL LOW (ref 26.0–34.0)
MCHC: 30 g/dL (ref 30.0–36.0)
MCV: 82.3 fL (ref 80.0–100.0)
Platelets: 147 10*3/uL — ABNORMAL LOW (ref 150–400)
RBC: 3 MIL/uL — ABNORMAL LOW (ref 4.22–5.81)
RDW: 16.5 % — ABNORMAL HIGH (ref 11.5–15.5)
WBC: 6.2 10*3/uL (ref 4.0–10.5)
nRBC: 0 % (ref 0.0–0.2)

## 2020-11-11 NOTE — Progress Notes (Signed)
[facility]  ROUNDING NOTE   Subjective:  Patient resting comfortably in bed. About to undergo dialysis treatment today. No significant neurologic change at this time.  Temperature 98.3 pulse 61 respirations Jeffery blood pressure 192/90   Objective:  Vital signs in last 24 hours:  Temperature 97.4 pulse 55 respirations Jeffery blood pressure 189/93  Physical Exam: General:  No acute distress  Head:  Normocephalic, atraumatic.    Eyes:  Anicteric  Neck:  Tracheostomy in place  Lungs:   Scattered rhonchi, normal effort  Heart:  S1S2 no rubs  Abdomen:   Soft, nontender, bowel sounds present  Extremities:  3+ bilateral upper extremity edema, pedal edema noted  Neurologic:  Bilateral hand contractures, not following commands  Skin:  No acute skin rash  Access:  Left upper extremity AV fistula    Basic Metabolic Panel: Recent Labs  Lab 11/05/20 0436 11/06/20 0540 11/09/20 0557 11/11/20 0644  NA 135 132* 133* 132*  K 3.8 4.9 4.2 3.7  CL 100 98 96* 95*  CO2 27 22 29 28   GLUCOSE 177* 150* 190* 167*  BUN 31* 44* 44* 38*  CREATININE 2.06* 2.47* 2.54* 2.49*  CALCIUM 8.4* 8.1* 8.4* 8.2*  PHOS  --  3.3 3.3 3.0     Liver Function Tests: Recent Labs  Lab 11/06/20 0540 11/09/20 0557 11/11/20 0644  ALBUMIN <1.5* 1.6* 1.6*    No results for input(s): LIPASE, AMYLASE in the last 168 hours. No results for input(s): AMMONIA in the last 168 hours.  CBC: Recent Labs  Lab 11/06/20 0540 11/09/20 0557 11/11/20 0644  WBC 6.5 6.3 6.2  HGB 7.3* 7.7* 7.4*  HCT 24.2* 25.5* 24.7*  MCV 81.5 81.7 82.3  PLT 152 147* 147*     Cardiac Enzymes: No results for input(s): CKTOTAL, CKMB, CKMBINDEX, TROPONINI in the last 168 hours.  BNP: Invalid input(s): POCBNP  CBG: No results for input(s): GLUCAP in the last 168 hours.  Microbiology: Results for orders placed or performed during the hospital encounter of 07/17/20  Culture, blood (routine x 2)     Status: None    Collection Time: 07/27/20 11:17 AM   Specimen: BLOOD RIGHT ARM  Result Value Ref Range Status   Specimen Description BLOOD RIGHT ARM  Final   Special Requests   Final    BOTTLES DRAWN AEROBIC ONLY Blood Culture results may not be optimal due to an inadequate volume of blood received in culture bottles   Culture   Final    NO GROWTH 5 DAYS Performed at [facility], [place] [address], [place], [place] [address]    Report Status 08/01/2020 FINAL  Final  Culture, blood (routine x 2)     Status: None   Collection Time: 07/27/20 11:23 AM   Specimen: BLOOD RIGHT HAND  Result Value Ref Range Status   Specimen Description BLOOD RIGHT HAND  Final   Special Requests   Final    BOTTLES DRAWN AEROBIC ONLY Blood Culture results may not be optimal due to an inadequate volume of blood received in culture bottles   Culture   Final    NO GROWTH 5 DAYS Performed at [facility], [place] [address] [place], [place] [address]    Report Status 08/01/2020 FINAL  Final  Culture, blood (routine x 2)     Status: None   Collection Time: 08/01/20  2:25 PM   Specimen: BLOOD RIGHT HAND  Result Value Ref Range Status   Specimen Description BLOOD  RIGHT HAND  Final   Special Requests   Final    BOTTLES DRAWN AEROBIC AND ANAEROBIC Blood Culture results may not be optimal due to an inadequate volume of blood received in culture bottles   Culture   Final    NO GROWTH 5 DAYS Performed at [facility], [place] [address], [place], [place] [address]    Report Status 08/06/2020 FINAL  Final  Culture, blood (routine x 2)     Status: None   Collection Time: 08/01/20  2:39 PM   Specimen: BLOOD RIGHT ARM  Result Value Ref Range Status   Specimen Description BLOOD RIGHT ARM  Final   Special Requests   Final    BOTTLES DRAWN AEROBIC ONLY Blood Culture results may not be optimal due to an inadequate volume of blood received in culture bottles   Culture   Final    NO GROWTH 5 DAYS Performed at [facility], [place] [address], [place], [place] [address]    Report Status 08/06/2020 FINAL  Final  Culture, Respiratory w Gram Stain     Status: None   Collection Time: 08/01/20  2:55 PM   Specimen: Tracheal Aspirate; Respiratory  Result Value Ref Range Status   Specimen Description TRACHEAL ASPIRATE  Final   Special Requests NONE  Final   Gram Stain   Final    FEW WBC PRESENT, PREDOMINANTLY PMN MODERATE GRAM NEGATIVE RODS Performed at [facility], [place] [address] [place], [place] [address]    Culture ABUNDANT PSEUDOMONAS AERUGINOSA  Final   Report Status 08/04/2020 FINAL  Final   Organism ID, Bacteria PSEUDOMONAS AERUGINOSA  Final      Susceptibility   Pseudomonas aeruginosa - MIC*    CEFTAZIDIME <=1 SENSITIVE Sensitive     CIPROFLOXACIN <=0.25 SENSITIVE Sensitive     GENTAMICIN 8 INTERMEDIATE Intermediate     IMIPENEM >=16 RESISTANT Resistant     PIP/TAZO <=4 SENSITIVE Sensitive     CEFEPIME 2 SENSITIVE Sensitive     * ABUNDANT PSEUDOMONAS AERUGINOSA  Culture, Respiratory w Gram Stain     Status: None   Collection Time: 09/17/20  5:21 PM   Specimen: Tracheal Aspirate; Respiratory  Result Value Ref Range Status   Specimen Description TRACHEAL ASPIRATE  Final   Special Requests NONE  Final   Gram Stain   Final    ABUNDANT WBC PRESENT,BOTH PMN AND MONONUCLEAR MODERATE SQUAMOUS EPITHELIAL CELLS PRESENT MODERATE GRAM POSITIVE COCCI MODERATE GRAM NEGATIVE RODS Performed at [facility], [place] [address], [place], [place] [address]    Culture FEW PSEUDOMONAS AERUGINOSA  Final   Report Status 09/22/2020 FINAL  Final   Organism ID, Bacteria PSEUDOMONAS AERUGINOSA  Final      Susceptibility   Pseudomonas aeruginosa - MIC*    CEFTAZIDIME 2 SENSITIVE Sensitive     CIPROFLOXACIN 1 SENSITIVE Sensitive     GENTAMICIN >=16 RESISTANT Resistant     IMIPENEM >=16 RESISTANT Resistant     PIP/TAZO <=4 SENSITIVE Sensitive     * FEW PSEUDOMONAS AERUGINOSA  Culture, blood (routine x  2)     Status: None   Collection Time: 09/18/20 12:05 PM   Specimen: BLOOD RIGHT ARM  Result Value Ref Range Status   Specimen Description BLOOD RIGHT ARM  Final   Special Requests   Final    BOTTLES DRAWN AEROBIC AND ANAEROBIC Blood Culture adequate volume   Culture   Final    NO GROWTH 5 DAYS  Performed at [facility], [place] [name], [place], [place] [address]    Report Status 09/23/2020 FINAL  Final  Culture, blood (routine x 2)     Status: None   Collection Time: 09/18/20 12:05 PM   Specimen: BLOOD RIGHT HAND  Result Value Ref Range Status   Specimen Description BLOOD RIGHT HAND  Final   Special Requests   Final    BOTTLES DRAWN AEROBIC AND ANAEROBIC Blood Culture adequate volume   Culture   Final    NO GROWTH 5 DAYS Performed at [facility], [place] [address], [place], [place] [address]    Report Status 09/23/2020 FINAL  Final  Culture, Respiratory w Gram Stain     Status: None   Collection Time: 09/29/20 12:06 PM   Specimen: Tracheal Aspirate; Respiratory  Result Value Ref Range Status   Specimen Description TRACHEAL ASPIRATE  Final   Special Requests NONE  Final   Gram Stain   Final    RARE SQUAMOUS EPITHELIAL CELLS PRESENT ABUNDANT WBC PRESENT,BOTH PMN AND MONONUCLEAR ABUNDANT GRAM NEGATIVE RODS    Culture   Final    FEW PSEUDOMONAS AERUGINOSA MULTI-DRUG RESISTANT ORGANISM CRITICAL RESULT CALLED TO, READ BACK BY AND VERIFIED WITH: [place] RN @1145  10/03/20 EB Performed at [facility], [place] [address], [place], [place] [address]    Report Status 10/03/2020 FINAL  Final   Organism ID, Bacteria PSEUDOMONAS AERUGINOSA  Final      Susceptibility   Pseudomonas aeruginosa - MIC*    CEFTAZIDIME 16 INTERMEDIATE Intermediate     CIPROFLOXACIN 0.5 SENSITIVE Sensitive     GENTAMICIN >=16 RESISTANT Resistant     IMIPENEM >=16 RESISTANT Resistant     * FEW PSEUDOMONAS AERUGINOSA  Culture, blood (routine x 2)     Status: Abnormal   Collection Time:  09/29/20  1:07 PM   Specimen: BLOOD RIGHT ARM  Result Value Ref Range Status   Specimen Description BLOOD RIGHT ARM  Final   Special Requests   Final    BOTTLES DRAWN AEROBIC AND ANAEROBIC Blood Culture adequate volume   Culture  Setup Time   Final    GRAM POSITIVE COCCI IN CHAINS AEROBIC BOTTLE ONLY CRITICAL RESULT CALLED TO, READ BACK BY AND VERIFIED WITH: RN S.SNED AT 1233 ON 09/30/2020 BY T.SAAD. Performed at [facility], [place] [address] [place], [place] [address]    Culture VANCOMYCIN RESISTANT ENTEROCOCCUS (A)  Final   Report Status 10/02/2020 FINAL  Final   Organism ID, Bacteria VANCOMYCIN RESISTANT ENTEROCOCCUS  Final      Susceptibility   Vancomycin resistant enterococcus - MIC*    AMPICILLIN <=2 SENSITIVE Sensitive     VANCOMYCIN >=32 RESISTANT Resistant     GENTAMICIN SYNERGY SENSITIVE Sensitive     LINEZOLID 2 SENSITIVE Sensitive     * VANCOMYCIN RESISTANT ENTEROCOCCUS  Blood Culture ID Panel (Reflexed)     Status: Abnormal   Collection Time: 09/29/20  1:07 PM  Result Value Ref Range Status   Enterococcus faecalis DETECTED (A) NOT DETECTED Final    Comment: CRITICAL RESULT CALLED TO, READ BACK BY AND VERIFIED WITH: RN S.SNED AT 1233 ON 09/30/2020 BY T.SAAD.    Enterococcus Faecium NOT DETECTED NOT DETECTED Final   Listeria monocytogenes NOT DETECTED NOT DETECTED Final   Staphylococcus species NOT DETECTED NOT DETECTED Final   Staphylococcus aureus (BCID) NOT DETECTED NOT DETECTED Final   Staphylococcus epidermidis NOT DETECTED NOT DETECTED Final   Staphylococcus lugdunensis  NOT DETECTED NOT DETECTED Final   Streptococcus species NOT DETECTED NOT DETECTED Final   Streptococcus agalactiae NOT DETECTED NOT DETECTED Final   Streptococcus pneumoniae NOT DETECTED NOT DETECTED Final   Streptococcus pyogenes NOT DETECTED NOT DETECTED Final   A.calcoaceticus-baumannii NOT DETECTED NOT DETECTED Final   Bacteroides fragilis NOT DETECTED NOT DETECTED Final    Enterobacterales NOT DETECTED NOT DETECTED Final   Enterobacter cloacae complex NOT DETECTED NOT DETECTED Final   Escherichia coli NOT DETECTED NOT DETECTED Final   Klebsiella aerogenes NOT DETECTED NOT DETECTED Final   Klebsiella oxytoca NOT DETECTED NOT DETECTED Final   Klebsiella pneumoniae NOT DETECTED NOT DETECTED Final   Proteus species NOT DETECTED NOT DETECTED Final   Salmonella species NOT DETECTED NOT DETECTED Final   Serratia marcescens NOT DETECTED NOT DETECTED Final   Haemophilus influenzae NOT DETECTED NOT DETECTED Final   Neisseria meningitidis NOT DETECTED NOT DETECTED Final   Pseudomonas aeruginosa NOT DETECTED NOT DETECTED Final   Stenotrophomonas maltophilia NOT DETECTED NOT DETECTED Final   Candida albicans NOT DETECTED NOT DETECTED Final   Candida auris NOT DETECTED NOT DETECTED Final   Candida glabrata NOT DETECTED NOT DETECTED Final   Candida krusei NOT DETECTED NOT DETECTED Final   Candida parapsilosis NOT DETECTED NOT DETECTED Final   Candida tropicalis NOT DETECTED NOT DETECTED Final   Cryptococcus neoformans/gattii NOT DETECTED NOT DETECTED Final   Vancomycin resistance DETECTED (A) NOT DETECTED Final    Comment: CRITICAL RESULT CALLED TO, READ BACK BY AND VERIFIED WITH: RN S.SNED AT 1233 ON 09/30/2020 BY T.SAAD. Performed at [facility], [place] [address], [place], [place] [address]   Culture, blood (routine x 2)     Status: None   Collection Time: 09/29/20  1:14 PM   Specimen: BLOOD RIGHT ARM  Result Value Ref Range Status   Specimen Description BLOOD RIGHT ARM  Final   Special Requests   Final    BOTTLES DRAWN AEROBIC AND ANAEROBIC Blood Culture adequate volume   Culture   Final    NO GROWTH 5 DAYS Performed at [facility], [place] [address], [place], [place] [address]    Report Status 10/04/2020 FINAL  Final  Culture, blood (routine x 2)     Status: None   Collection Time: 10/Jeffery/22  9:57 AM   Specimen: BLOOD RIGHT HAND  Result Value Ref  Range Status   Specimen Description BLOOD RIGHT HAND  Final   Special Requests   Final    BOTTLES DRAWN AEROBIC ONLY Blood Culture adequate volume   Culture   Final    NO GROWTH 5 DAYS Performed at [facility], [place] [address] [place], [place] [address]    Report Status 10/25/2020 FINAL  Final  Culture, blood (routine x 2)     Status: None   Collection Time: 10/Jeffery/22  9:57 AM   Specimen: BLOOD RIGHT HAND  Result Value Ref Range Status   Specimen Description BLOOD RIGHT HAND  Final   Special Requests   Final    BOTTLES DRAWN AEROBIC ONLY Blood Culture adequate volume   Culture   Final    NO GROWTH 5 DAYS Performed at [facility], [place] [address], [place], [place] [address]    Report Status 10/25/2020 FINAL  Final    Coagulation Studies: No results for input(s): LABPROT, INR in the last 72 hours.   Urinalysis: No results for input(s): COLORURINE, LABSPEC, [place], [place], [place], [place], [place], [place], [place],  NITRITE, LEUKOCYTESUR in the last 72 hours.  Invalid input(s): APPERANCEUR    Imaging: DG Abd 1 View  Result Date: 11/10/2020 CLINICAL DATA:  Nausea and vomiting EXAM: ABDOMEN - 1 VIEW COMPARISON:  10/Jeffery/2022 FINDINGS: Percutaneous gastrostomy tube again seen in the region of the stomach. No dilated loops of small bowel to indicate ileus or obstruction. Mild gaseous distension of the colon. No suspicious calcifications. Linear opacities at the left lung base likely due to atelectasis. IMPRESSION: Nonobstructive bowel gas pattern. Electronically Signed   By: Miachel Roux M.D.   On: 11/10/2020 13:56     Medications:       Assessment/ Plan:  45 y.o. male with a PMHx of ESRD on HD, posterior reversible encephalopathy syndrome, diabetes mellitus type 2, chronic systolic heart failure, anemia of chronic kidney disease, secondary hyperparathyroidism, malignant hypertension, acute respiratory failure, who was admitted to [facility] on  06/29/2020 for ongoing treatment of acute respiratory failure, posterior reversible encephalopathy syndrome, and end-stage renal disease.   1.  ESRD on HD.  We plan to maintain the patient on MWF dialysis schedule.  He is due for dialysis treatment today.  Nurses will perform his dialysis treatment shortly.  2.  Anemia of chronic kidney disease.  Hemoglobin down slightly to 7.4.  Has required blood transfusion this admission.  Continue to monitor CBC.  Otherwise maintain the patient on Retacrit.  3.  Secondary hyperparathyroidism.  Phosphorus within target range at 3.0.  4.  Acute respiratory failure.  Tracheostomy placed 07/17/2020.  Respiratory status remained stable via T-piece collar currently.  5.  Hypertension.  Blood pressure continues to be labile.  Accurate blood pressure readings in question due to the extensive upper extremity edema.  Blood pressure currently 192/90.  Continue amlodipine, clonidine, hydralazine, labetalol, and losartan.   Cully Luckow 11/9/20221:41 PM

## 2020-11-12 NOTE — Consult Note (Signed)
Ref: Jacqualine Code, DO   Subjective:  Awake, looking at ceiling. Monitor: Sinus rhythm P: 62/min, R: 28 on 28 % FiO2, BP: 230/106 post changes in his BP medications  Objective:  Vital Signs in the last 24 hours:    Physical Exam: BP Readings from Last 1 Encounters:  07/28/20 (!) 168/82     Wt Readings from Last 1 Encounters:  No data found for Wt    Weight change:  There is no height or weight on file to calculate BMI. HEENT: [place]/AT, Eyes-Brown, Conjunctiva-Pale, Sclera-Non-icteric Neck: No JVD, No bruit, Trachea midline. Lungs:  Clearing, Bilateral. Cardiac:  Regular rhythm, normal S1 and S2, no S3. II/VI systolic murmur. Abdomen:  Soft, non-tender. BS present. Extremities:  2 + UE edema present. No cyanosis. No clubbing. CNS: AxOx0.  Skin: Warm and dry.   Intake/Output from previous day: No intake/output data recorded.    Lab Results: BMET    Component Value Date/Time   NA 132 (L) 11/11/2020 0644   NA 133 (L) 11/09/2020 0557   NA 132 (L) 11/06/2020 0540   K 3.7 11/11/2020 0644   K 4.2 11/09/2020 0557   K 4.9 11/06/2020 0540   CL 95 (L) 11/11/2020 0644   CL 96 (L) 11/09/2020 0557   CL 98 11/06/2020 0540   CO2 28 11/11/2020 0644   CO2 29 11/09/2020 0557   CO2 22 11/06/2020 0540   GLUCOSE 167 (H) 11/11/2020 0644   GLUCOSE 190 (H) 11/09/2020 0557   GLUCOSE 150 (H) 11/06/2020 0540   BUN 38 (H) 11/11/2020 0644   BUN 44 (H) 11/09/2020 0557   BUN 44 (H) 11/06/2020 0540   CREATININE 2.49 (H) 11/11/2020 0644   CREATININE 2.54 (H) 11/09/2020 0557   CREATININE 2.47 (H) 11/06/2020 0540   CALCIUM 8.2 (L) 11/11/2020 0644   CALCIUM 8.4 (L) 11/09/2020 0557   CALCIUM 8.1 (L) 11/06/2020 0540   GFRNONAA 31 (L) 11/11/2020 0644   GFRNONAA 31 (L) 11/09/2020 0557   GFRNONAA 32 (L) 11/06/2020 0540   CBC    Component Value Date/Time   WBC 6.2 11/11/2020 0644   RBC 3.00 (L) 11/11/2020 0644   HGB 7.4 (L) 11/11/2020 0644   HCT 24.7 (L) 11/11/2020 0644   PLT 147 (L)  11/11/2020 0644   MCV 82.3 11/11/2020 0644   MCH 24.7 (L) 11/11/2020 0644   MCHC 30.0 11/11/2020 0644   RDW 16.5 (H) 11/11/2020 0644   LYMPHSABS 1.4 09/21/2020 1052   MONOABS 0.6 09/21/2020 1052   EOSABS 0.3 09/21/2020 1052   BASOSABS 0.1 09/21/2020 1052   HEPATIC Function Panel Recent Labs    06/29/20 2137  PROT 6.0*   HEMOGLOBIN A1C No components found for: HGA1C,  MPG CARDIAC ENZYMES No results found for: CKTOTAL, CKMB, CKMBINDEX, TROPONINI BNP No results for input(s): PROBNP in the last 8760 hours. TSH Recent Labs    06/29/20 2137  TSH 5.995*   CHOLESTEROL No results for input(s): CHOL in the last 8760 hours.  Scheduled Meds: Continuous Infusions: PRN Meds:.  Assessment/Plan:  Hypertension, uncontrolled Acute on chronic respiratory failure with hypoxia ESRD with HD Anemia of CKD S/P stroke S/P encephalopathy Chronic bilateral upper extremites edema  Plan: Increase Labetalol. Scheduled lorazepam.   LOS: 0 days   Time spent including chart review, lab review, examination, discussion with patient/Nurse : 30 min   Dixie Dials  MD  11/12/2020, 10:59 AM

## 2020-11-12 NOTE — Consult Note (Signed)
Ref: Jacqualine Code, DO   Subjective:  Poorly responsive  Objective:  Vital Signs in the last 24 hours:  P: 62, R: 20, BP: 190/100, O2 sat 99 %  Physical Exam: BP Readings from Last 1 Encounters:  07/28/20 (!) 168/82     Wt Readings from Last 1 Encounters:  No data found for Wt    Weight change:  There is no height or weight on file to calculate BMI. HEENT: [place]/AT, Eyes-Brown, Conjunctiva-Pale, Sclera-Non-icteric Neck: No JVD, No bruit, Trachea midline. Lungs:  Clearing, Bilateral. Cardiac:  Regular rhythm, normal S1 and S2, no S3. II/VI systolic murmur. Abdomen:  Soft, non-tender. BS present. Extremities:  2 + UE edema present. No cyanosis. No clubbing. CNS: AxOx0,   Skin: Warm and dry.   Intake/Output from previous day: No intake/output data recorded.    Lab Results: BMET    Component Value Date/Time   NA 132 (L) 11/11/2020 0644   NA 133 (L) 11/09/2020 0557   NA 132 (L) 11/06/2020 0540   K 3.7 11/11/2020 0644   K 4.2 11/09/2020 0557   K 4.9 11/06/2020 0540   CL 95 (L) 11/11/2020 0644   CL 96 (L) 11/09/2020 0557   CL 98 11/06/2020 0540   CO2 28 11/11/2020 0644   CO2 29 11/09/2020 0557   CO2 22 11/06/2020 0540   GLUCOSE 167 (H) 11/11/2020 0644   GLUCOSE 190 (H) 11/09/2020 0557   GLUCOSE 150 (H) 11/06/2020 0540   BUN 38 (H) 11/11/2020 0644   BUN 44 (H) 11/09/2020 0557   BUN 44 (H) 11/06/2020 0540   CREATININE 2.49 (H) 11/11/2020 0644   CREATININE 2.54 (H) 11/09/2020 0557   CREATININE 2.47 (H) 11/06/2020 0540   CALCIUM 8.2 (L) 11/11/2020 0644   CALCIUM 8.4 (L) 11/09/2020 0557   CALCIUM 8.1 (L) 11/06/2020 0540   GFRNONAA 31 (L) 11/11/2020 0644   GFRNONAA 31 (L) 11/09/2020 0557   GFRNONAA 32 (L) 11/06/2020 0540   CBC    Component Value Date/Time   WBC 6.2 11/11/2020 0644   RBC 3.00 (L) 11/11/2020 0644   HGB 7.4 (L) 11/11/2020 0644   HCT 24.7 (L) 11/11/2020 0644   PLT 147 (L) 11/11/2020 0644   MCV 82.3 11/11/2020 0644   MCH 24.7 (L) 11/11/2020  0644   MCHC 30.0 11/11/2020 0644   RDW 16.5 (H) 11/11/2020 0644   LYMPHSABS 1.4 09/21/2020 1052   MONOABS 0.6 09/21/2020 1052   EOSABS 0.3 09/21/2020 1052   BASOSABS 0.1 09/21/2020 1052   HEPATIC Function Panel Recent Labs    06/29/20 2137  PROT 6.0*   HEMOGLOBIN A1C No components found for: HGA1C,  MPG CARDIAC ENZYMES No results found for: CKTOTAL, CKMB, CKMBINDEX, TROPONINI BNP No results for input(s): PROBNP in the last 8760 hours. TSH Recent Labs    06/29/20 2137  TSH 5.995*   CHOLESTEROL No results for input(s): CHOL in the last 8760 hours.  Scheduled Meds: Continuous Infusions: PRN Meds:.  Assessment/Plan:  Hypertension, uncontrolled Acute and chronic respiratory failure with hypoxia ESRD with HD Anemia of CKD S/P stroke S/P encephalopathy  Plan: Adjust clonidine, Labetalol and amlodipine. Add Lorazepam x 1.   LOS: 0 days   Time spent including chart review, lab review, examination, discussion with patient : 30 min   Dixie Dials  MD  11/12/2020, 10:46 AM

## 2020-11-12 NOTE — Consult Note (Signed)
Ref: Jacqualine Code, DO   Subjective:  Improved BP control post dialysis  Objective:  Vital Signs in the last 24 hours:  P: 60's, R: 18, BP: 130's/80's. Sinus rhythm.  Physical Exam: BP Readings from Last 1 Encounters:  07/28/20 (!) 168/82     Wt Readings from Last 1 Encounters:  No data found for Wt    Weight change:  There is no height or weight on file to calculate BMI. HEENT: [place], Eyes-Brown, Conjunctiva-Pale, Sclera-Non-icteric Neck: No JVD, No bruit, Trachea midline. Lungs:  Clearing, Bilateral. Cardiac:  Regular rhythm, normal S1 and S2, no S3. II/VI systolic murmur. Abdomen:  Soft. Extremities:  2 + UE edema present. No cyanosis. No clubbing. CNS: AxOx0.  Skin: Warm and dry.   Intake/Output from previous day: No intake/output data recorded.    Lab Results: BMET    Component Value Date/Time   NA 132 (L) 11/11/2020 0644   NA 133 (L) 11/09/2020 0557   NA 132 (L) 11/06/2020 0540   K 3.7 11/11/2020 0644   K 4.2 11/09/2020 0557   K 4.9 11/06/2020 0540   CL 95 (L) 11/11/2020 0644   CL 96 (L) 11/09/2020 0557   CL 98 11/06/2020 0540   CO2 28 11/11/2020 0644   CO2 29 11/09/2020 0557   CO2 22 11/06/2020 0540   GLUCOSE 167 (H) 11/11/2020 0644   GLUCOSE 190 (H) 11/09/2020 0557   GLUCOSE 150 (H) 11/06/2020 0540   BUN 38 (H) 11/11/2020 0644   BUN 44 (H) 11/09/2020 0557   BUN 44 (H) 11/06/2020 0540   CREATININE 2.49 (H) 11/11/2020 0644   CREATININE 2.54 (H) 11/09/2020 0557   CREATININE 2.47 (H) 11/06/2020 0540   CALCIUM 8.2 (L) 11/11/2020 0644   CALCIUM 8.4 (L) 11/09/2020 0557   CALCIUM 8.1 (L) 11/06/2020 0540   GFRNONAA 31 (L) 11/11/2020 0644   GFRNONAA 31 (L) 11/09/2020 0557   GFRNONAA 32 (L) 11/06/2020 0540   CBC    Component Value Date/Time   WBC 6.2 11/11/2020 0644   RBC 3.00 (L) 11/11/2020 0644   HGB 7.4 (L) 11/11/2020 0644   HCT 24.7 (L) 11/11/2020 0644   PLT 147 (L) 11/11/2020 0644   MCV 82.3 11/11/2020 0644   MCH 24.7 (L) 11/11/2020  0644   MCHC 30.0 11/11/2020 0644   RDW 16.5 (H) 11/11/2020 0644   LYMPHSABS 1.4 09/21/2020 1052   MONOABS 0.6 09/21/2020 1052   EOSABS 0.3 09/21/2020 1052   BASOSABS 0.1 09/21/2020 1052   HEPATIC Function Panel Recent Labs    06/29/20 2137  PROT 6.0*   HEMOGLOBIN A1C No components found for: HGA1C,  MPG CARDIAC ENZYMES No results found for: CKTOTAL, CKMB, CKMBINDEX, TROPONINI BNP No results for input(s): PROBNP in the last 8760 hours. TSH Recent Labs    06/29/20 2137  TSH 5.995*   CHOLESTEROL No results for input(s): CHOL in the last 8760 hours.  Scheduled Meds: Continuous Infusions: PRN Meds:.  Assessment/Plan:  Hypertension, improved control Acute on chronic respiratory failure with hyp[oxia ESRD with HD Anemia of CKD S/P stroke S/P encephalopathy  Plan: Continue medical treatment. Limit fluid intake if possible.   LOS: 0 days   Time spent including chart review, lab review, examination, discussion with patient/Nurse : 30 min   [place] Dials  MD  11/12/2020, 10:51 AM

## 2020-11-13 LAB — CBC
HCT: 27.8 % — ABNORMAL LOW (ref 39.0–52.0)
Hemoglobin: 8.2 g/dL — ABNORMAL LOW (ref 13.0–17.0)
MCH: 24.3 pg — ABNORMAL LOW (ref 26.0–34.0)
MCHC: 29.5 g/dL — ABNORMAL LOW (ref 30.0–36.0)
MCV: 82.2 fL (ref 80.0–100.0)
Platelets: 167 10*3/uL (ref 150–400)
RBC: 3.38 MIL/uL — ABNORMAL LOW (ref 4.22–5.81)
RDW: 16.3 % — ABNORMAL HIGH (ref 11.5–15.5)
WBC: 6.6 10*3/uL (ref 4.0–10.5)
nRBC: 0 % (ref 0.0–0.2)

## 2020-11-13 LAB — RENAL FUNCTION PANEL
Albumin: 1.6 g/dL — ABNORMAL LOW (ref 3.5–5.0)
Anion gap: 8 (ref 5–15)
BUN: 37 mg/dL — ABNORMAL HIGH (ref 6–20)
CO2: 30 mmol/L (ref 22–32)
Calcium: 8.2 mg/dL — ABNORMAL LOW (ref 8.9–10.3)
Chloride: 98 mmol/L (ref 98–111)
Creatinine, Ser: 2.39 mg/dL — ABNORMAL HIGH (ref 0.61–1.24)
GFR, Estimated: 33 mL/min — ABNORMAL LOW (ref >60)
Glucose, Bld: 84 mg/dL (ref 70–99)
Phosphorus: 2.6 mg/dL (ref 2.5–4.6)
Potassium: 3.9 mmol/L (ref 3.5–5.1)
Sodium: 136 mmol/L (ref 135–145)

## 2020-11-13 NOTE — Progress Notes (Signed)
[facility]  ROUNDING NOTE   Subjective:  Patient continues to do well with hemodialysis treatments. Currently resting comfortably in bed. Patient deviated towards the left and not following commands.   Objective:  Vital signs in last 24 hours:  Temperature 96.9 pulse 50 respirations 28 blood pressure 161/88  Physical Exam: General:  No acute distress  Head:  Normocephalic, atraumatic.    Eyes:  Anicteric  Neck:  Tracheostomy in place  Lungs:   Scattered rhonchi, normal effort  Heart:  S1S2 no rubs  Abdomen:   Soft, nontender, bowel sounds present  Extremities:  3+ bilateral upper extremity edema, pedal edema noted  Neurologic:  Bilateral hand contractures, not following commands  Skin:  No acute skin rash  Access:  Left upper extremity AV fistula    Basic Metabolic Panel: Recent Labs  Lab 11/09/20 0557 11/11/20 0644 11/13/20 0516  NA 133* 132* 136  K 4.2 3.7 3.9  CL 96* 95* 98  CO2 29 28 30   GLUCOSE 190* 167* 84  BUN 44* 38* 37*  CREATININE 2.54* 2.49* 2.39*  CALCIUM 8.4* 8.2* 8.2*  PHOS 3.3 3.0 2.6     Liver Function Tests: Recent Labs  Lab 11/09/20 0557 11/11/20 0644 11/13/20 0516  ALBUMIN 1.6* 1.6* 1.6*    No results for input(s): LIPASE, AMYLASE in the last 168 hours. No results for input(s): AMMONIA in the last 168 hours.  CBC: Recent Labs  Lab 11/09/20 0557 11/11/20 0644 11/13/20 0516  WBC 6.3 6.2 6.6  HGB 7.7* 7.4* 8.2*  HCT 25.5* 24.7* 27.8*  MCV 81.7 82.3 82.2  PLT 147* 147* 167     Cardiac Enzymes: No results for input(s): CKTOTAL, CKMB, CKMBINDEX, TROPONINI in the last 168 hours.  BNP: Invalid input(s): POCBNP  CBG: No results for input(s): GLUCAP in the last 168 hours.  Microbiology: Results for orders placed or performed during the hospital encounter of 07/17/20  Culture, blood (routine x 2)     Status: None   Collection Time: 07/27/20 11:17 AM   Specimen: BLOOD RIGHT ARM  Result Value Ref Range Status    Specimen Description BLOOD RIGHT ARM  Final   Special Requests   Final    BOTTLES DRAWN AEROBIC ONLY Blood Culture results may not be optimal due to an inadequate volume of blood received in culture bottles   Culture   Final    NO GROWTH 5 DAYS Performed at [facility], [place] [address], [place], [place] [address]    Report Status 08/01/2020 FINAL  Final  Culture, blood (routine x 2)     Status: None   Collection Time: 07/27/20 11:23 AM   Specimen: BLOOD RIGHT HAND  Result Value Ref Range Status   Specimen Description BLOOD RIGHT HAND  Final   Special Requests   Final    BOTTLES DRAWN AEROBIC ONLY Blood Culture results may not be optimal due to an inadequate volume of blood received in culture bottles   Culture   Final    NO GROWTH 5 DAYS Performed at [facility], [place] [address] [place], [place] [address]    Report Status 08/01/2020 FINAL  Final  Culture, blood (routine x 2)     Status: None   Collection Time: 08/01/20  2:25 PM   Specimen: BLOOD RIGHT HAND  Result Value Ref Range Status   Specimen Description BLOOD RIGHT HAND  Final   Special Requests   Final    BOTTLES DRAWN AEROBIC AND ANAEROBIC  Blood Culture results may not be optimal due to an inadequate volume of blood received in culture bottles   Culture   Final    NO GROWTH 5 DAYS Performed at [place] [address] [place], [place] [address]    Report Status 08/06/2020 FINAL  Final  Culture, blood (routine x 2)     Status: None   Collection Time: 08/01/20  2:39 PM   Specimen: BLOOD RIGHT ARM  Result Value Ref Range Status   Specimen Description BLOOD RIGHT ARM  Final   Special Requests   Final    BOTTLES DRAWN AEROBIC ONLY Blood Culture results may not be optimal due to an inadequate volume of blood received in culture bottles   Culture   Final    NO GROWTH 5 DAYS Performed at [facility], [place] [address] [place], [place] [address]    Report Status 08/06/2020 FINAL  Final  Culture,  Respiratory w Gram Stain     Status: None   Collection Time: 08/01/20  2:55 PM   Specimen: Tracheal Aspirate; Respiratory  Result Value Ref Range Status   Specimen Description TRACHEAL ASPIRATE  Final   Special Requests NONE  Final   Gram Stain   Final    FEW WBC PRESENT, PREDOMINANTLY PMN MODERATE GRAM NEGATIVE RODS Performed at [facility], [place] [address], [place], [place] [address]    Culture ABUNDANT PSEUDOMONAS AERUGINOSA  Final   Report Status 08/04/2020 FINAL  Final   Organism ID, Bacteria PSEUDOMONAS AERUGINOSA  Final      Susceptibility   Pseudomonas aeruginosa - MIC*    CEFTAZIDIME <=1 SENSITIVE Sensitive     CIPROFLOXACIN <=0.25 SENSITIVE Sensitive     GENTAMICIN 8 INTERMEDIATE Intermediate     IMIPENEM >=16 RESISTANT Resistant     PIP/TAZO <=4 SENSITIVE Sensitive     CEFEPIME 2 SENSITIVE Sensitive     * ABUNDANT PSEUDOMONAS AERUGINOSA  Culture, Respiratory w Gram Stain     Status: None   Collection Time: 09/17/20  5:21 PM   Specimen: Tracheal Aspirate; Respiratory  Result Value Ref Range Status   Specimen Description TRACHEAL ASPIRATE  Final   Special Requests NONE  Final   Gram Stain   Final    ABUNDANT WBC PRESENT,BOTH PMN AND MONONUCLEAR MODERATE SQUAMOUS EPITHELIAL CELLS PRESENT MODERATE GRAM POSITIVE COCCI MODERATE GRAM NEGATIVE RODS Performed at [facility], [place] [address] [place], [place] [address]    Culture FEW PSEUDOMONAS AERUGINOSA  Final   Report Status 09/22/2020 FINAL  Final   Organism ID, Bacteria PSEUDOMONAS AERUGINOSA  Final      Susceptibility   Pseudomonas aeruginosa - MIC*    CEFTAZIDIME 2 SENSITIVE Sensitive     CIPROFLOXACIN 1 SENSITIVE Sensitive     GENTAMICIN >=16 RESISTANT Resistant     IMIPENEM >=16 RESISTANT Resistant     PIP/TAZO <=4 SENSITIVE Sensitive     * FEW PSEUDOMONAS AERUGINOSA  Culture, blood (routine x 2)     Status: None   Collection Time: 09/18/20 12:05 PM   Specimen: BLOOD RIGHT ARM  Result Value Ref  Range Status   Specimen Description BLOOD RIGHT ARM  Final   Special Requests   Final    BOTTLES DRAWN AEROBIC AND ANAEROBIC Blood Culture adequate volume   Culture   Final    NO GROWTH 5 DAYS Performed at [facility], [address], [place], [place] [address]    Report Status 09/23/2020  FINAL  Final  Culture, blood (routine x 2)     Status: None   Collection Time: 09/18/20 12:05 PM   Specimen: BLOOD RIGHT HAND  Result Value Ref Range Status   Specimen Description BLOOD RIGHT HAND  Final   Special Requests   Final    BOTTLES DRAWN AEROBIC AND ANAEROBIC Blood Culture adequate volume   Culture   Final    NO GROWTH 5 DAYS Performed at [facility], [address], [place], [place] [address]    Report Status 09/23/2020 FINAL  Final  Culture, Respiratory w Gram Stain     Status: None   Collection Time: 09/29/20 12:06 PM   Specimen: Tracheal Aspirate; Respiratory  Result Value Ref Range Status   Specimen Description TRACHEAL ASPIRATE  Final   Special Requests NONE  Final   Gram Stain   Final    RARE SQUAMOUS EPITHELIAL CELLS PRESENT ABUNDANT WBC PRESENT,BOTH PMN AND MONONUCLEAR ABUNDANT GRAM NEGATIVE RODS    Culture   Final    FEW PSEUDOMONAS AERUGINOSA MULTI-DRUG RESISTANT ORGANISM CRITICAL RESULT CALLED TO, READ BACK BY AND VERIFIED WITH: [place] RN @1145  10/03/20 EB Performed at [facility], [place] [address], [place], [place] [address]    Report Status 10/03/2020 FINAL  Final   Organism ID, Bacteria PSEUDOMONAS AERUGINOSA  Final      Susceptibility   Pseudomonas aeruginosa - MIC*    CEFTAZIDIME 16 INTERMEDIATE Intermediate     CIPROFLOXACIN 0.5 SENSITIVE Sensitive     GENTAMICIN >=16 RESISTANT Resistant     IMIPENEM >=16 RESISTANT Resistant     * FEW PSEUDOMONAS AERUGINOSA  Culture, blood (routine x 2)     Status: Abnormal   Collection Time: 09/29/20  1:07 PM   Specimen: BLOOD RIGHT ARM  Result Value Ref Range Status   Specimen Description BLOOD  RIGHT ARM  Final   Special Requests   Final    BOTTLES DRAWN AEROBIC AND ANAEROBIC Blood Culture adequate volume   Culture  Setup Time   Final    GRAM POSITIVE COCCI IN CHAINS AEROBIC BOTTLE ONLY CRITICAL RESULT CALLED TO, READ BACK BY AND VERIFIED WITH: RN S.SNED AT 1233 ON 09/30/2020 BY T.SAAD. Performed at [facility], [place] [address] [place], [place] [address]    Culture VANCOMYCIN RESISTANT ENTEROCOCCUS (A)  Final   Report Status 10/02/2020 FINAL  Final   Organism ID, Bacteria VANCOMYCIN RESISTANT ENTEROCOCCUS  Final      Susceptibility   Vancomycin resistant enterococcus - MIC*    AMPICILLIN <=2 SENSITIVE Sensitive     VANCOMYCIN >=32 RESISTANT Resistant     GENTAMICIN SYNERGY SENSITIVE Sensitive     LINEZOLID 2 SENSITIVE Sensitive     * VANCOMYCIN RESISTANT ENTEROCOCCUS  Blood Culture ID Panel (Reflexed)     Status: Abnormal   Collection Time: 09/29/20  1:07 PM  Result Value Ref Range Status   Enterococcus faecalis DETECTED (A) NOT DETECTED Final    Comment: CRITICAL RESULT CALLED TO, READ BACK BY AND VERIFIED WITH: RN S.SNED AT 1233 ON 09/30/2020 BY T.SAAD.    Enterococcus Faecium NOT DETECTED NOT DETECTED Final   Listeria monocytogenes NOT DETECTED NOT DETECTED Final   Staphylococcus species NOT DETECTED NOT DETECTED Final   Staphylococcus aureus (BCID) NOT DETECTED NOT DETECTED Final   Staphylococcus epidermidis NOT DETECTED NOT DETECTED Final   Staphylococcus lugdunensis NOT DETECTED NOT DETECTED Final   Streptococcus species NOT DETECTED NOT DETECTED Final   Streptococcus agalactiae NOT  DETECTED NOT DETECTED Final   Streptococcus pneumoniae NOT DETECTED NOT DETECTED Final   Streptococcus pyogenes NOT DETECTED NOT DETECTED Final   A.calcoaceticus-baumannii NOT DETECTED NOT DETECTED Final   Bacteroides fragilis NOT DETECTED NOT DETECTED Final   Enterobacterales NOT DETECTED NOT DETECTED Final   Enterobacter cloacae complex NOT DETECTED NOT DETECTED Final    Escherichia coli NOT DETECTED NOT DETECTED Final   Klebsiella aerogenes NOT DETECTED NOT DETECTED Final   Klebsiella oxytoca NOT DETECTED NOT DETECTED Final   Klebsiella pneumoniae NOT DETECTED NOT DETECTED Final   Proteus species NOT DETECTED NOT DETECTED Final   Salmonella species NOT DETECTED NOT DETECTED Final   Serratia marcescens NOT DETECTED NOT DETECTED Final   Haemophilus influenzae NOT DETECTED NOT DETECTED Final   Neisseria meningitidis NOT DETECTED NOT DETECTED Final   Pseudomonas aeruginosa NOT DETECTED NOT DETECTED Final   Stenotrophomonas maltophilia NOT DETECTED NOT DETECTED Final   Candida albicans NOT DETECTED NOT DETECTED Final   Candida auris NOT DETECTED NOT DETECTED Final   Candida glabrata NOT DETECTED NOT DETECTED Final   Candida krusei NOT DETECTED NOT DETECTED Final   Candida parapsilosis NOT DETECTED NOT DETECTED Final   Candida tropicalis NOT DETECTED NOT DETECTED Final   Cryptococcus neoformans/gattii NOT DETECTED NOT DETECTED Final   Vancomycin resistance DETECTED (A) NOT DETECTED Final    Comment: CRITICAL RESULT CALLED TO, READ BACK BY AND VERIFIED WITH: RN S.SNED AT 1233 ON 09/30/2020 BY T.SAAD. Performed at [facility], [place] [address], [place], [place] [address]   Culture, blood (routine x 2)     Status: None   Collection Time: 09/29/20  1:14 PM   Specimen: BLOOD RIGHT ARM  Result Value Ref Range Status   Specimen Description BLOOD RIGHT ARM  Final   Special Requests   Final    BOTTLES DRAWN AEROBIC AND ANAEROBIC Blood Culture adequate volume   Culture   Final    NO GROWTH 5 DAYS Performed at [facility], [place] [address], [place], [place] [address]    Report Status 10/04/2020 FINAL  Final  Culture, blood (routine x 2)     Status: None   Collection Time: 10/20/20  9:57 AM   Specimen: BLOOD RIGHT HAND  Result Value Ref Range Status   Specimen Description BLOOD RIGHT HAND  Final   Special Requests   Final    BOTTLES DRAWN AEROBIC  ONLY Blood Culture adequate volume   Culture   Final    NO GROWTH 5 DAYS Performed at [facility], [place] [address] [place], [place] [address]    Report Status 10/25/2020 FINAL  Final  Culture, blood (routine x 2)     Status: None   Collection Time: 10/20/20  9:57 AM   Specimen: BLOOD RIGHT HAND  Result Value Ref Range Status   Specimen Description BLOOD RIGHT HAND  Final   Special Requests   Final    BOTTLES DRAWN AEROBIC ONLY Blood Culture adequate volume   Culture   Final    NO GROWTH 5 DAYS Performed at [facility], [place] [address] [place], [place] [address]    Report Status 10/25/2020 FINAL  Final    Coagulation Studies: No results for input(s): LABPROT, INR in the last 72 hours.   Urinalysis: No results for input(s): COLORURINE, LABSPEC, PHURINE, GLUCOSEU, HGBUR, BILIRUBINUR, KETONESUR, PROTEINUR, UROBILINOGEN, NITRITE, LEUKOCYTESUR in the last 72 hours.  Invalid input(s): APPERANCEUR    Imaging: No results found.  Medications:       Assessment/ Plan:  45 y.o. male with a PMHx of ESRD on HD, posterior reversible encephalopathy syndrome, diabetes mellitus type 2, chronic systolic heart failure, anemia of chronic kidney disease, secondary hyperparathyroidism, malignant hypertension, acute respiratory failure, who was admitted to [facility] on 06/29/2020 for ongoing treatment of acute respiratory failure, posterior reversible encephalopathy syndrome, and end-stage renal disease.   1.  ESRD on HD.  Patient has continued to tolerate dialysis treatments well.  We will plan for next dialysis treatment again on Monday.  2.  Anemia of chronic kidney disease.  Hemoglobin up to 8.2.  Maintain the patient on Retacrit at this time.  3.  Secondary hyperparathyroidism.  Phosphorus at target at 2.6.  Continue to monitor bone mineral metabolism parameters.  4.  Acute respiratory failure.  Tracheostomy placed 07/17/2020.  Respiratory status remained stable via  T-piece collar currently.  5.  Hypertension.  Blood pressure continues to be labile.  Accurate blood pressure readings in question due to the extensive upper extremity edema.  Blood pressure today was 160/88.  Continue amlodipine, clonidine, hydralazine, labetalol, and losartan.   Jacayla Nordell 11/11/202212:25 PM

## 2020-11-16 LAB — RENAL FUNCTION PANEL
Albumin: 1.8 g/dL — ABNORMAL LOW (ref 3.5–5.0)
Anion gap: 10 (ref 5–15)
BUN: 42 mg/dL — ABNORMAL HIGH (ref 6–20)
CO2: 28 mmol/L (ref 22–32)
Calcium: 8.7 mg/dL — ABNORMAL LOW (ref 8.9–10.3)
Chloride: 94 mmol/L — ABNORMAL LOW (ref 98–111)
Creatinine, Ser: 2.54 mg/dL — ABNORMAL HIGH (ref 0.61–1.24)
GFR, Estimated: 31 mL/min — ABNORMAL LOW (ref >60)
Glucose, Bld: 112 mg/dL — ABNORMAL HIGH (ref 70–99)
Phosphorus: 2.7 mg/dL (ref 2.5–4.6)
Potassium: 4.4 mmol/L (ref 3.5–5.1)
Sodium: 132 mmol/L — ABNORMAL LOW (ref 135–145)

## 2020-11-16 LAB — CBC
HCT: 28.5 % — ABNORMAL LOW (ref 39.0–52.0)
Hemoglobin: 8.5 g/dL — ABNORMAL LOW (ref 13.0–17.0)
MCH: 24.3 pg — ABNORMAL LOW (ref 26.0–34.0)
MCHC: 29.8 g/dL — ABNORMAL LOW (ref 30.0–36.0)
MCV: 81.4 fL (ref 80.0–100.0)
Platelets: 224 10*3/uL (ref 150–400)
RBC: 3.5 MIL/uL — ABNORMAL LOW (ref 4.22–5.81)
RDW: 16.3 % — ABNORMAL HIGH (ref 11.5–15.5)
WBC: 9.9 10*3/uL (ref 4.0–10.5)
nRBC: 0 % (ref 0.0–0.2)

## 2020-11-16 NOTE — Progress Notes (Signed)
[facility]  ROUNDING NOTE   Subjective:  Case discussed with Dr. Marcille Blanco over the weekend. He has had elevated blood pressures over the weekend. It has been requested that we increase ultrafiltration target today. This was discussed with dialysis nurse and we plan for ultrafiltration target of 3 kg.   Objective:  Vital signs in last 24 hours:  Temperature 95.3 pulse 68 respirations 12 blood pressure 183/100  Physical Exam: General:  No acute distress  Head:  Normocephalic, atraumatic.    Eyes:  Anicteric  Neck:  Tracheostomy in place  Lungs:   Scattered rhonchi, normal effort  Heart:  S1S2 no rubs  Abdomen:   Soft, nontender, bowel sounds present  Extremities:  3+ bilateral upper extremity edema, pedal edema noted  Neurologic:  Bilateral hand contractures, not following commands  Skin:  No acute skin rash  Access:  Left upper extremity AV fistula    Basic Metabolic Panel: Recent Labs  Lab 11/11/20 0644 11/13/20 0516 11/16/20 0453  NA 132* 136 132*  K 3.7 3.9 4.4  CL 95* 98 94*  CO2 28 30 28   GLUCOSE 167* 84 112*  BUN 38* 37* 42*  CREATININE 2.49* 2.39* 2.54*  CALCIUM 8.2* 8.2* 8.7*  PHOS 3.0 2.6 2.7     Liver Function Tests: Recent Labs  Lab 11/11/20 0644 11/13/20 0516 11/16/20 0453  ALBUMIN 1.6* 1.6* 1.8*    No results for input(s): LIPASE, AMYLASE in the last 168 hours. No results for input(s): AMMONIA in the last 168 hours.  CBC: Recent Labs  Lab 11/11/20 0644 11/13/20 0516 11/16/20 0453  WBC 6.2 6.6 9.9  HGB 7.4* 8.2* 8.5*  HCT 24.7* 27.8* 28.5*  MCV 82.3 82.2 81.4  PLT 147* 167 224     Cardiac Enzymes: No results for input(s): CKTOTAL, CKMB, CKMBINDEX, TROPONINI in the last 168 hours.  BNP: Invalid input(s): POCBNP  CBG: No results for input(s): GLUCAP in the last 168 hours.  Microbiology: Results for orders placed or performed during the hospital encounter of 07/17/20  Culture, blood (routine x 2)     Status: None    Collection Time: 07/27/20 11:17 AM   Specimen: BLOOD RIGHT ARM  Result Value Ref Range Status   Specimen Description BLOOD RIGHT ARM  Final   Special Requests   Final    BOTTLES DRAWN AEROBIC ONLY Blood Culture results may not be optimal due to an inadequate volume of blood received in culture bottles   Culture   Final    NO GROWTH 5 DAYS Performed at [facility], [place] [address], [place], [place] [address]    Report Status 08/01/2020 FINAL  Final  Culture, blood (routine x 2)     Status: None   Collection Time: 07/27/20 11:23 AM   Specimen: BLOOD RIGHT HAND  Result Value Ref Range Status   Specimen Description BLOOD RIGHT HAND  Final   Special Requests   Final    BOTTLES DRAWN AEROBIC ONLY Blood Culture results may not be optimal due to an inadequate volume of blood received in culture bottles   Culture   Final    NO GROWTH 5 DAYS Performed at [facility], [place] [address], [place], [place] [address]    Report Status 08/01/2020 FINAL  Final  Culture, blood (routine x 2)     Status: None   Collection Time: 08/01/20  2:25 PM   Specimen: BLOOD RIGHT HAND  Result Value Ref Range Status   Specimen Description  BLOOD RIGHT HAND  Final   Special Requests   Final    BOTTLES DRAWN AEROBIC AND ANAEROBIC Blood Culture results may not be optimal due to an inadequate volume of blood received in culture bottles   Culture   Final    NO GROWTH 5 DAYS Performed at [facility], [place] [address] [place], [place] [address]    Report Status 08/06/2020 FINAL  Final  Culture, blood (routine x 2)     Status: None   Collection Time: 08/01/20  2:39 PM   Specimen: BLOOD RIGHT ARM  Result Value Ref Range Status   Specimen Description BLOOD RIGHT ARM  Final   Special Requests   Final    BOTTLES DRAWN AEROBIC ONLY Blood Culture results may not be optimal due to an inadequate volume of blood received in culture bottles   Culture   Final    NO GROWTH 5 DAYS Performed at [facility], [place] [address] [place], [place] [address]    Report Status 08/06/2020 FINAL  Final  Culture, Respiratory w Gram Stain     Status: None   Collection Time: 08/01/20  2:55 PM   Specimen: Tracheal Aspirate; Respiratory  Result Value Ref Range Status   Specimen Description TRACHEAL ASPIRATE  Final   Special Requests NONE  Final   Gram Stain   Final    FEW WBC PRESENT, PREDOMINANTLY PMN MODERATE GRAM NEGATIVE RODS Performed at [facility], [place] [address], [place], [place] [address]    Culture ABUNDANT PSEUDOMONAS AERUGINOSA  Final   Report Status 08/04/2020 FINAL  Final   Organism ID, Bacteria PSEUDOMONAS AERUGINOSA  Final      Susceptibility   Pseudomonas aeruginosa - MIC*    CEFTAZIDIME <=1 SENSITIVE Sensitive     CIPROFLOXACIN <=0.25 SENSITIVE Sensitive     GENTAMICIN 8 INTERMEDIATE Intermediate     IMIPENEM >=16 RESISTANT Resistant     PIP/TAZO <=4 SENSITIVE Sensitive     CEFEPIME 2 SENSITIVE Sensitive     * ABUNDANT PSEUDOMONAS AERUGINOSA  Culture, Respiratory w Gram Stain     Status: None   Collection Time: 09/17/20  5:21 PM   Specimen: Tracheal Aspirate; Respiratory  Result Value Ref Range Status   Specimen Description TRACHEAL ASPIRATE  Final   Special Requests NONE  Final   Gram Stain   Final    ABUNDANT WBC PRESENT,BOTH PMN AND MONONUCLEAR MODERATE SQUAMOUS EPITHELIAL CELLS PRESENT MODERATE GRAM POSITIVE COCCI MODERATE GRAM NEGATIVE RODS Performed at [facility], [place] [address] [place], [place] [address]    Culture FEW PSEUDOMONAS AERUGINOSA  Final   Report Status 09/22/2020 FINAL  Final   Organism ID, Bacteria PSEUDOMONAS AERUGINOSA  Final      Susceptibility   Pseudomonas aeruginosa - MIC*    CEFTAZIDIME 2 SENSITIVE Sensitive     CIPROFLOXACIN 1 SENSITIVE Sensitive     GENTAMICIN >=16 RESISTANT Resistant     IMIPENEM >=16 RESISTANT Resistant     PIP/TAZO <=4 SENSITIVE Sensitive     * FEW PSEUDOMONAS AERUGINOSA  Culture, blood (routine x  2)     Status: None   Collection Time: 09/18/20 12:05 PM   Specimen: BLOOD RIGHT ARM  Result Value Ref Range Status   Specimen Description BLOOD RIGHT ARM  Final   Special Requests   Final    BOTTLES DRAWN AEROBIC AND ANAEROBIC Blood Culture adequate volume   Culture   Final    NO GROWTH 5  DAYS Performed at [facility], [place] [address] [place], [place] [address]    Report Status 09/23/2020 FINAL  Final  Culture, blood (routine x 2)     Status: None   Collection Time: 09/18/20 12:05 PM   Specimen: BLOOD RIGHT HAND  Result Value Ref Range Status   Specimen Description BLOOD RIGHT HAND  Final   Special Requests   Final    BOTTLES DRAWN AEROBIC AND ANAEROBIC Blood Culture adequate volume   Culture   Final    NO GROWTH 5 DAYS Performed at [facility], [place] [address], [place], [place] [address]    Report Status 09/23/2020 FINAL  Final  Culture, Respiratory w Gram Stain     Status: None   Collection Time: 09/29/20 12:06 PM   Specimen: Tracheal Aspirate; Respiratory  Result Value Ref Range Status   Specimen Description TRACHEAL ASPIRATE  Final   Special Requests NONE  Final   Gram Stain   Final    RARE SQUAMOUS EPITHELIAL CELLS PRESENT ABUNDANT WBC PRESENT,BOTH PMN AND MONONUCLEAR ABUNDANT GRAM NEGATIVE RODS    Culture   Final    FEW PSEUDOMONAS AERUGINOSA MULTI-DRUG RESISTANT ORGANISM CRITICAL RESULT CALLED TO, READ BACK BY AND VERIFIED WITH: Erwin RN @1145  10/03/20 EB Performed at [facility], [place] [address], [place], [place] [address]    Report Status 10/03/2020 FINAL  Final   Organism ID, Bacteria PSEUDOMONAS AERUGINOSA  Final      Susceptibility   Pseudomonas aeruginosa - MIC*    CEFTAZIDIME 16 INTERMEDIATE Intermediate     CIPROFLOXACIN 0.5 SENSITIVE Sensitive     GENTAMICIN >=16 RESISTANT Resistant     IMIPENEM >=16 RESISTANT Resistant     * FEW PSEUDOMONAS AERUGINOSA  Culture, blood (routine x 2)     Status: Abnormal   Collection Time:  09/29/20  1:07 PM   Specimen: BLOOD RIGHT ARM  Result Value Ref Range Status   Specimen Description BLOOD RIGHT ARM  Final   Special Requests   Final    BOTTLES DRAWN AEROBIC AND ANAEROBIC Blood Culture adequate volume   Culture  Setup Time   Final    GRAM POSITIVE COCCI IN CHAINS AEROBIC BOTTLE ONLY CRITICAL RESULT CALLED TO, READ BACK BY AND VERIFIED WITH: RN S.SNED AT 1233 ON 09/30/2020 BY T.SAAD. Performed at [facility], [place] [address], [place], [place] [address]    Culture VANCOMYCIN RESISTANT ENTEROCOCCUS (A)  Final   Report Status 10/02/2020 FINAL  Final   Organism ID, Bacteria VANCOMYCIN RESISTANT ENTEROCOCCUS  Final      Susceptibility   Vancomycin resistant enterococcus - MIC*    AMPICILLIN <=2 SENSITIVE Sensitive     VANCOMYCIN >=32 RESISTANT Resistant     GENTAMICIN SYNERGY SENSITIVE Sensitive     LINEZOLID 2 SENSITIVE Sensitive     * VANCOMYCIN RESISTANT ENTEROCOCCUS  Blood Culture ID Panel (Reflexed)     Status: Abnormal   Collection Time: 09/29/20  1:07 PM  Result Value Ref Range Status   Enterococcus faecalis DETECTED (A) NOT DETECTED Final    Comment: CRITICAL RESULT CALLED TO, READ BACK BY AND VERIFIED WITH: RN S.SNED AT 1233 ON 09/30/2020 BY T.SAAD.    Enterococcus Faecium NOT DETECTED NOT DETECTED Final   Listeria monocytogenes NOT DETECTED NOT DETECTED Final   Staphylococcus species NOT DETECTED NOT DETECTED Final   Staphylococcus aureus (BCID) NOT DETECTED NOT DETECTED Final   Staphylococcus epidermidis NOT DETECTED NOT DETECTED Final   Staphylococcus  lugdunensis NOT DETECTED NOT DETECTED Final   Streptococcus species NOT DETECTED NOT DETECTED Final   Streptococcus agalactiae NOT DETECTED NOT DETECTED Final   Streptococcus pneumoniae NOT DETECTED NOT DETECTED Final   Streptococcus pyogenes NOT DETECTED NOT DETECTED Final   A.calcoaceticus-baumannii NOT DETECTED NOT DETECTED Final   Bacteroides fragilis NOT DETECTED NOT DETECTED Final    Enterobacterales NOT DETECTED NOT DETECTED Final   Enterobacter cloacae complex NOT DETECTED NOT DETECTED Final   Escherichia coli NOT DETECTED NOT DETECTED Final   Klebsiella aerogenes NOT DETECTED NOT DETECTED Final   Klebsiella oxytoca NOT DETECTED NOT DETECTED Final   Klebsiella pneumoniae NOT DETECTED NOT DETECTED Final   Proteus species NOT DETECTED NOT DETECTED Final   Salmonella species NOT DETECTED NOT DETECTED Final   Serratia marcescens NOT DETECTED NOT DETECTED Final   Haemophilus influenzae NOT DETECTED NOT DETECTED Final   Neisseria meningitidis NOT DETECTED NOT DETECTED Final   Pseudomonas aeruginosa NOT DETECTED NOT DETECTED Final   Stenotrophomonas maltophilia NOT DETECTED NOT DETECTED Final   Candida albicans NOT DETECTED NOT DETECTED Final   Candida auris NOT DETECTED NOT DETECTED Final   Candida glabrata NOT DETECTED NOT DETECTED Final   Candida krusei NOT DETECTED NOT DETECTED Final   Candida parapsilosis NOT DETECTED NOT DETECTED Final   Candida tropicalis NOT DETECTED NOT DETECTED Final   Cryptococcus neoformans/gattii NOT DETECTED NOT DETECTED Final   Vancomycin resistance DETECTED (A) NOT DETECTED Final    Comment: CRITICAL RESULT CALLED TO, READ BACK BY AND VERIFIED WITH: RN S.SNED AT 1233 ON 09/30/2020 BY T.SAAD. Performed at [facility], [place] [address], [place], [place] [address]   Culture, blood (routine x 2)     Status: None   Collection Time: 09/29/20  1:14 PM   Specimen: BLOOD RIGHT ARM  Result Value Ref Range Status   Specimen Description BLOOD RIGHT ARM  Final   Special Requests   Final    BOTTLES DRAWN AEROBIC AND ANAEROBIC Blood Culture adequate volume   Culture   Final    NO GROWTH 5 DAYS Performed at [facility], [place] [address] [place], [place] [address]    Report Status 10/04/2020 FINAL  Final  Culture, blood (routine x 2)     Status: None   Collection Time: 10/20/20  9:57 AM   Specimen: BLOOD RIGHT HAND  Result Value Ref  Range Status   Specimen Description BLOOD RIGHT HAND  Final   Special Requests   Final    BOTTLES DRAWN AEROBIC ONLY Blood Culture adequate volume   Culture   Final    NO GROWTH 5 DAYS Performed at [facility], [place] [address] [place], [place] [address]    Report Status 10/25/2020 FINAL  Final  Culture, blood (routine x 2)     Status: None   Collection Time: 10/20/20  9:57 AM   Specimen: BLOOD RIGHT HAND  Result Value Ref Range Status   Specimen Description BLOOD RIGHT HAND  Final   Special Requests   Final    BOTTLES DRAWN AEROBIC ONLY Blood Culture adequate volume   Culture   Final    NO GROWTH 5 DAYS Performed at [facility], [place] [address], [place], [place] [address]    Report Status 10/25/2020 FINAL  Final    Coagulation Studies: No results for input(s): LABPROT, INR in the last 72 hours.   Urinalysis: No results for input(s): COLORURINE, LABSPEC, [place], [place], [place], [place], [place], [place],  UROBILINOGEN, NITRITE, LEUKOCYTESUR in the last 72 hours.  Invalid input(s): APPERANCEUR    Imaging: No results found.   Medications:       Assessment/ Plan:  46 y.o. male with a PMHx of ESRD on HD, posterior reversible encephalopathy syndrome, diabetes mellitus type 2, chronic systolic heart failure, anemia of chronic kidney disease, secondary hyperparathyroidism, malignant hypertension, acute respiratory failure, who was admitted to [facility] on 06/29/2020 for ongoing treatment of acute respiratory failure, posterior reversible encephalopathy syndrome, and end-stage renal disease.   1.  ESRD on HD.  We will plan to perform dialysis treatment today.  Blood pressure is noted to be high.  We will increase ultrafiltration target 3 kg today.  2.  Anemia of chronic kidney disease.  Hemoglobin up slightly to 8.5.  Maintain the patient on current dosage of Retacrit.  3.  Secondary hyperparathyroidism.  Phosphorus 2.7 this a.m. and  acceptable.  4.  Acute respiratory failure.  Tracheostomy placed 07/17/2020.  Patient with stable respiratory pattern via T-piece collar.  5.  Hypertension.  Blood pressure does appear higher today at 183/100.  We are planning to provide increased ultrafiltration today at 3 kg and follow blood pressures.  However accurate blood pressure readings in question due to the extensive upper extremity edema.   Continue amlodipine, clonidine, hydralazine, labetalol, and losartan.   Lowen Barringer 11/14/20228:08 AM

## 2020-11-18 LAB — CBC
HCT: 24.2 % — ABNORMAL LOW (ref 39.0–52.0)
Hemoglobin: 7 g/dL — ABNORMAL LOW (ref 13.0–17.0)
MCH: 23.8 pg — ABNORMAL LOW (ref 26.0–34.0)
MCHC: 28.9 g/dL — ABNORMAL LOW (ref 30.0–36.0)
MCV: 82.3 fL (ref 80.0–100.0)
Platelets: 200 10*3/uL (ref 150–400)
RBC: 2.94 MIL/uL — ABNORMAL LOW (ref 4.22–5.81)
RDW: 15.9 % — ABNORMAL HIGH (ref 11.5–15.5)
WBC: 9.1 10*3/uL (ref 4.0–10.5)
nRBC: 0 % (ref 0.0–0.2)

## 2020-11-18 LAB — RENAL FUNCTION PANEL
Albumin: 1.7 g/dL — ABNORMAL LOW (ref 3.5–5.0)
Anion gap: 8 (ref 5–15)
BUN: 40 mg/dL — ABNORMAL HIGH (ref 6–20)
CO2: 28 mmol/L (ref 22–32)
Calcium: 8.6 mg/dL — ABNORMAL LOW (ref 8.9–10.3)
Chloride: 97 mmol/L — ABNORMAL LOW (ref 98–111)
Creatinine, Ser: 2.4 mg/dL — ABNORMAL HIGH (ref 0.61–1.24)
GFR, Estimated: 33 mL/min — ABNORMAL LOW (ref >60)
Glucose, Bld: 153 mg/dL — ABNORMAL HIGH (ref 70–99)
Phosphorus: 2.2 mg/dL — ABNORMAL LOW (ref 2.5–4.6)
Potassium: 4.2 mmol/L (ref 3.5–5.1)
Sodium: 133 mmol/L — ABNORMAL LOW (ref 135–145)

## 2020-11-18 LAB — PREPARE RBC (CROSSMATCH)

## 2020-11-18 NOTE — Progress Notes (Signed)
[facility]  ROUNDING NOTE   Subjective:  Patient due for hemodialysis treatment today.  Despite ultrafiltration continues to have elevated blood pressure. Hemoglobin down to 7.0.   Objective:  Vital signs in last 24 hours:  Temperature 97.8 pulse 66 respirations 19 blood pressure 207/97  Physical Exam: General:  No acute distress  Head:  Normocephalic, atraumatic.    Eyes:  Anicteric  Neck:  Tracheostomy in place  Lungs:   Scattered rhonchi, normal effort  Heart:  S1S2 no rubs  Abdomen:   Soft, nontender, bowel sounds present  Extremities:  3+ bilateral upper extremity edema, pedal edema noted  Neurologic:  Bilateral hand contractures, not following commands  Skin:  No acute skin rash  Access:  Left upper extremity AV fistula    Basic Metabolic Panel: Recent Labs  Lab 11/13/20 0516 11/16/20 0453 11/18/20 0406  NA 136 132* 133*  K 3.9 4.4 4.2  CL 98 94* 97*  CO2 30 28 28   GLUCOSE 84 112* 153*  BUN 37* 42* 40*  CREATININE 2.39* 2.54* 2.40*  CALCIUM 8.2* 8.7* 8.6*  PHOS 2.6 2.7 2.2*     Liver Function Tests: Recent Labs  Lab 11/13/20 0516 11/16/20 0453 11/18/20 0406  ALBUMIN 1.6* 1.8* 1.7*    No results for input(s): LIPASE, AMYLASE in the last 168 hours. No results for input(s): AMMONIA in the last 168 hours.  CBC: Recent Labs  Lab 11/13/20 0516 11/16/20 0453 11/18/20 0406  WBC 6.6 9.9 9.1  HGB 8.2* 8.5* 7.0*  HCT 27.8* 28.5* 24.2*  MCV 82.2 81.4 82.3  PLT 167 224 200     Cardiac Enzymes: No results for input(s): CKTOTAL, CKMB, CKMBINDEX, TROPONINI in the last 168 hours.  BNP: Invalid input(s): POCBNP  CBG: No results for input(s): GLUCAP in the last 168 hours.  Microbiology: Results for orders placed or performed during the hospital encounter of 07/17/20  Culture, blood (routine x 2)     Status: None   Collection Time: 07/27/20 11:17 AM   Specimen: BLOOD RIGHT ARM  Result Value Ref Range Status   Specimen Description  BLOOD RIGHT ARM  Final   Special Requests   Final    BOTTLES DRAWN AEROBIC ONLY Blood Culture results may not be optimal due to an inadequate volume of blood received in culture bottles   Culture   Final    NO GROWTH 5 DAYS Performed at [facility], [place] [address] [place], [place] [address]    Report Status 08/01/2020 FINAL  Final  Culture, blood (routine x 2)     Status: None   Collection Time: 07/27/20 11:23 AM   Specimen: BLOOD RIGHT HAND  Result Value Ref Range Status   Specimen Description BLOOD RIGHT HAND  Final   Special Requests   Final    BOTTLES DRAWN AEROBIC ONLY Blood Culture results may not be optimal due to an inadequate volume of blood received in culture bottles   Culture   Final    NO GROWTH 5 DAYS Performed at [facility], [place] [address], [place], [place] [address]    Report Status 08/01/2020 FINAL  Final  Culture, blood (routine x 2)     Status: None   Collection Time: 08/01/20  2:25 PM   Specimen: BLOOD RIGHT HAND  Result Value Ref Range Status   Specimen Description BLOOD RIGHT HAND  Final   Special Requests   Final    BOTTLES DRAWN AEROBIC AND ANAEROBIC Blood Culture results  may not be optimal due to an inadequate volume of blood received in culture bottles   Culture   Final    NO GROWTH 5 DAYS Performed at [facility], [place] [address] [place], [place] [address]    Report Status 08/06/2020 FINAL  Final  Culture, blood (routine x 2)     Status: None   Collection Time: 08/01/20  2:39 PM   Specimen: BLOOD RIGHT ARM  Result Value Ref Range Status   Specimen Description BLOOD RIGHT ARM  Final   Special Requests   Final    BOTTLES DRAWN AEROBIC ONLY Blood Culture results may not be optimal due to an inadequate volume of blood received in culture bottles   Culture   Final    NO GROWTH 5 DAYS Performed at [facility], [place] [address] [place], [place] [address]    Report Status 08/06/2020 FINAL  Final  Culture, Respiratory w Gram  Stain     Status: None   Collection Time: 08/01/20  2:55 PM   Specimen: Tracheal Aspirate; Respiratory  Result Value Ref Range Status   Specimen Description TRACHEAL ASPIRATE  Final   Special Requests NONE  Final   Gram Stain   Final    FEW WBC PRESENT, PREDOMINANTLY PMN MODERATE GRAM NEGATIVE RODS Performed at [facility], [place] [address], [place], [place] [address]    Culture ABUNDANT PSEUDOMONAS AERUGINOSA  Final   Report Status 08/04/2020 FINAL  Final   Organism ID, Bacteria PSEUDOMONAS AERUGINOSA  Final      Susceptibility   Pseudomonas aeruginosa - MIC*    CEFTAZIDIME <=1 SENSITIVE Sensitive     CIPROFLOXACIN <=0.25 SENSITIVE Sensitive     GENTAMICIN 8 INTERMEDIATE Intermediate     IMIPENEM >=16 RESISTANT Resistant     PIP/TAZO <=4 SENSITIVE Sensitive     CEFEPIME 2 SENSITIVE Sensitive     * ABUNDANT PSEUDOMONAS AERUGINOSA  Culture, Respiratory w Gram Stain     Status: None   Collection Time: 09/17/20  5:21 PM   Specimen: Tracheal Aspirate; Respiratory  Result Value Ref Range Status   Specimen Description TRACHEAL ASPIRATE  Final   Special Requests NONE  Final   Gram Stain   Final    ABUNDANT WBC PRESENT,BOTH PMN AND MONONUCLEAR MODERATE SQUAMOUS EPITHELIAL CELLS PRESENT MODERATE GRAM POSITIVE COCCI MODERATE GRAM NEGATIVE RODS Performed at [facility], [place] [address], [place], [place] [address]    Culture FEW PSEUDOMONAS AERUGINOSA  Final   Report Status 09/22/2020 FINAL  Final   Organism ID, Bacteria PSEUDOMONAS AERUGINOSA  Final      Susceptibility   Pseudomonas aeruginosa - MIC*    CEFTAZIDIME 2 SENSITIVE Sensitive     CIPROFLOXACIN 1 SENSITIVE Sensitive     GENTAMICIN >=16 RESISTANT Resistant     IMIPENEM >=16 RESISTANT Resistant     PIP/TAZO <=4 SENSITIVE Sensitive     * FEW PSEUDOMONAS AERUGINOSA  Culture, blood (routine x 2)     Status: None   Collection Time: 09/18/20 12:05 PM   Specimen: BLOOD RIGHT ARM  Result Value Ref Range Status    Specimen Description BLOOD RIGHT ARM  Final   Special Requests   Final    BOTTLES DRAWN AEROBIC AND ANAEROBIC Blood Culture adequate volume   Culture   Final    NO GROWTH 5 DAYS Performed at [facility], [address], [place], [place] [address]    Report Status 09/23/2020 FINAL  Final  Culture, blood (routine x 2)     Status: None   Collection Time: 09/18/20 12:05 PM   Specimen: BLOOD RIGHT HAND  Result Value Ref Range Status   Specimen Description BLOOD RIGHT HAND  Final   Special Requests   Final    BOTTLES DRAWN AEROBIC AND ANAEROBIC Blood Culture adequate volume   Culture   Final    NO GROWTH 5 DAYS Performed at [facility], [address] [place], [place] [address]    Report Status 09/23/2020 FINAL  Final  Culture, Respiratory w Gram Stain     Status: None   Collection Time: 09/29/20 12:06 PM   Specimen: Tracheal Aspirate; Respiratory  Result Value Ref Range Status   Specimen Description TRACHEAL ASPIRATE  Final   Special Requests NONE  Final   Gram Stain   Final    RARE SQUAMOUS EPITHELIAL CELLS PRESENT ABUNDANT WBC PRESENT,BOTH PMN AND MONONUCLEAR ABUNDANT GRAM NEGATIVE RODS    Culture   Final    FEW PSEUDOMONAS AERUGINOSA MULTI-DRUG RESISTANT ORGANISM CRITICAL RESULT CALLED TO, READ BACK BY AND VERIFIED WITH: Warren RN @1145  10/03/20 EB Performed at [facility], [place] [address] [place], [place] [address]    Report Status 10/03/2020 FINAL  Final   Organism ID, Bacteria PSEUDOMONAS AERUGINOSA  Final      Susceptibility   Pseudomonas aeruginosa - MIC*    CEFTAZIDIME 16 INTERMEDIATE Intermediate     CIPROFLOXACIN 0.5 SENSITIVE Sensitive     GENTAMICIN >=16 RESISTANT Resistant     IMIPENEM >=16 RESISTANT Resistant     * FEW PSEUDOMONAS AERUGINOSA  Culture, blood (routine x 2)     Status: Abnormal   Collection Time: 09/29/20  1:07 PM   Specimen: BLOOD RIGHT ARM  Result Value Ref Range Status   Specimen Description BLOOD RIGHT ARM  Final    Special Requests   Final    BOTTLES DRAWN AEROBIC AND ANAEROBIC Blood Culture adequate volume   Culture  Setup Time   Final    GRAM POSITIVE COCCI IN CHAINS AEROBIC BOTTLE ONLY CRITICAL RESULT CALLED TO, READ BACK BY AND VERIFIED WITH: RN S.SNED AT 1233 ON 09/30/2020 BY T.SAAD. Performed at [facility], [place] [address], [place], [place] [address]    Culture VANCOMYCIN RESISTANT ENTEROCOCCUS (A)  Final   Report Status 10/02/2020 FINAL  Final   Organism ID, Bacteria VANCOMYCIN RESISTANT ENTEROCOCCUS  Final      Susceptibility   Vancomycin resistant enterococcus - MIC*    AMPICILLIN <=2 SENSITIVE Sensitive     VANCOMYCIN >=32 RESISTANT Resistant     GENTAMICIN SYNERGY SENSITIVE Sensitive     LINEZOLID 2 SENSITIVE Sensitive     * VANCOMYCIN RESISTANT ENTEROCOCCUS  Blood Culture ID Panel (Reflexed)     Status: Abnormal   Collection Time: 09/29/20  1:07 PM  Result Value Ref Range Status   Enterococcus faecalis DETECTED (A) NOT DETECTED Final    Comment: CRITICAL RESULT CALLED TO, READ BACK BY AND VERIFIED WITH: RN S.SNED AT 1233 ON 09/30/2020 BY T.SAAD.    Enterococcus Faecium NOT DETECTED NOT DETECTED Final   Listeria monocytogenes NOT DETECTED NOT DETECTED Final   Staphylococcus species NOT DETECTED NOT DETECTED Final   Staphylococcus aureus (BCID) NOT DETECTED NOT DETECTED Final   Staphylococcus epidermidis NOT DETECTED NOT DETECTED Final   Staphylococcus lugdunensis NOT DETECTED NOT DETECTED Final   Streptococcus species NOT DETECTED NOT DETECTED Final   Streptococcus agalactiae NOT DETECTED NOT DETECTED Final  Streptococcus pneumoniae NOT DETECTED NOT DETECTED Final   Streptococcus pyogenes NOT DETECTED NOT DETECTED Final   A.calcoaceticus-baumannii NOT DETECTED NOT DETECTED Final   Bacteroides fragilis NOT DETECTED NOT DETECTED Final   Enterobacterales NOT DETECTED NOT DETECTED Final   Enterobacter cloacae complex NOT DETECTED NOT DETECTED Final   Escherichia coli NOT  DETECTED NOT DETECTED Final   Klebsiella aerogenes NOT DETECTED NOT DETECTED Final   Klebsiella oxytoca NOT DETECTED NOT DETECTED Final   Klebsiella pneumoniae NOT DETECTED NOT DETECTED Final   Proteus species NOT DETECTED NOT DETECTED Final   Salmonella species NOT DETECTED NOT DETECTED Final   Serratia marcescens NOT DETECTED NOT DETECTED Final   Haemophilus influenzae NOT DETECTED NOT DETECTED Final   Neisseria meningitidis NOT DETECTED NOT DETECTED Final   Pseudomonas aeruginosa NOT DETECTED NOT DETECTED Final   Stenotrophomonas maltophilia NOT DETECTED NOT DETECTED Final   Candida albicans NOT DETECTED NOT DETECTED Final   Candida auris NOT DETECTED NOT DETECTED Final   Candida glabrata NOT DETECTED NOT DETECTED Final   Candida krusei NOT DETECTED NOT DETECTED Final   Candida parapsilosis NOT DETECTED NOT DETECTED Final   Candida tropicalis NOT DETECTED NOT DETECTED Final   Cryptococcus neoformans/gattii NOT DETECTED NOT DETECTED Final   Vancomycin resistance DETECTED (A) NOT DETECTED Final    Comment: CRITICAL RESULT CALLED TO, READ BACK BY AND VERIFIED WITH: RN S.SNED AT 1233 ON 09/30/2020 BY T.SAAD. Performed at [facility], [place] [address] [place], [place] [address]   Culture, blood (routine x 2)     Status: None   Collection Time: 09/29/20  1:14 PM   Specimen: BLOOD RIGHT ARM  Result Value Ref Range Status   Specimen Description BLOOD RIGHT ARM  Final   Special Requests   Final    BOTTLES DRAWN AEROBIC AND ANAEROBIC Blood Culture adequate volume   Culture   Final    NO GROWTH 5 DAYS Performed at [facility], [place] [address], [place], [place] [address]    Report Status 10/04/2020 FINAL  Final  Culture, blood (routine x 2)     Status: None   Collection Time: 10/20/20  9:57 AM   Specimen: BLOOD RIGHT HAND  Result Value Ref Range Status   Specimen Description BLOOD RIGHT HAND  Final   Special Requests   Final    BOTTLES DRAWN AEROBIC ONLY Blood Culture  adequate volume   Culture   Final    NO GROWTH 5 DAYS Performed at [facility], [place] [address], [place], [place] [address]    Report Status 10/25/2020 FINAL  Final  Culture, blood (routine x 2)     Status: None   Collection Time: 10/20/20  9:57 AM   Specimen: BLOOD RIGHT HAND  Result Value Ref Range Status   Specimen Description BLOOD RIGHT HAND  Final   Special Requests   Final    BOTTLES DRAWN AEROBIC ONLY Blood Culture adequate volume   Culture   Final    NO GROWTH 5 DAYS Performed at [facility], [place] [address], [place], [place] [address]    Report Status 10/25/2020 FINAL  Final    Coagulation Studies: No results for input(s): LABPROT, INR in the last 72 hours.   Urinalysis: No results for input(s): COLORURINE, LABSPEC, PHURINE, GLUCOSEU, HGBUR, BILIRUBINUR, KETONESUR, PROTEINUR, UROBILINOGEN, NITRITE, LEUKOCYTESUR in the last 72 hours.  Invalid input(s): APPERANCEUR    Imaging: No results found.   Medications:  Assessment/ Plan:  45 y.o. male with a PMHx of ESRD on HD, posterior reversible encephalopathy syndrome, diabetes mellitus type 2, chronic systolic heart failure, anemia of chronic kidney disease, secondary hyperparathyroidism, malignant hypertension, acute respiratory failure, who was admitted to [facility] on 06/29/2020 for ongoing treatment of acute respiratory failure, posterior reversible encephalopathy syndrome, and end-stage renal disease.   1.  ESRD on HD.  Patient due for hemodialysis treatment today.  Orders have been prepared.  2.  Anemia of chronic kidney disease.  Hemoglobin down to 7.0.  Consider blood transfusion but defer to primary team.  3.  Secondary hyperparathyroidism.  Continue to periodically monitor bone metabolism parameters.  4.  Acute respiratory failure.  Tracheostomy placed 07/17/2020.  Patient with stable respiratory pattern via T-piece collar.  5.  Hypertension.  Blood pressure has been trending  higher over the past several days.  Blood pressure currently 207/97.  Patient currently maintained on amlodipine, clonidine oral and patch, hydralazine, labetalol, losartan, and terazosin.  We also attempted to increase ultrafiltration target on Monday.  Unclear as to whether these blood pressures are accurate as he has significant edema in his arms.   Tomma Ehinger 11/16/20228:31 AM

## 2020-11-19 LAB — CBC
HCT: 28 % — ABNORMAL LOW (ref 39.0–52.0)
Hemoglobin: 8.2 g/dL — ABNORMAL LOW (ref 13.0–17.0)
MCH: 24 pg — ABNORMAL LOW (ref 26.0–34.0)
MCHC: 29.3 g/dL — ABNORMAL LOW (ref 30.0–36.0)
MCV: 82.1 fL (ref 80.0–100.0)
Platelets: 188 10*3/uL (ref 150–400)
RBC: 3.41 MIL/uL — ABNORMAL LOW (ref 4.22–5.81)
RDW: 15.6 % — ABNORMAL HIGH (ref 11.5–15.5)
WBC: 7.5 10*3/uL (ref 4.0–10.5)
nRBC: 0 % (ref 0.0–0.2)

## 2020-11-19 LAB — TYPE AND SCREEN
ABO/RH(D): O POS
Antibody Screen: NEGATIVE
Unit division: 0

## 2020-11-19 LAB — BPAM RBC
Blood Product Expiration Date: 202212132359
ISSUE DATE / TIME: 202211161431
Unit Type and Rh: 5100

## 2020-11-20 NOTE — Progress Notes (Signed)
[facility]  ROUNDING NOTE   Subjective:  No significant neurologic change. Patient sitting up in chair. Head deviated towards the left.   Objective:  Vital signs in last 24 hours:  Temperature 97.1 pulse 63 respirations 18 blood pressure 197/98  Physical Exam: General:  No acute distress  Head:  Normocephalic, atraumatic.    Eyes:  Anicteric  Neck:  Tracheostomy in place  Lungs:   Scattered rhonchi, normal effort  Heart:  S1S2 no rubs  Abdomen:   Soft, nontender, bowel sounds present  Extremities:  3+ bilateral upper extremity edema, pedal edema noted  Neurologic:  Bilateral hand contractures, not following commands  Skin:  No acute skin rash  Access:  Left upper extremity AV fistula    Basic Metabolic Panel: Recent Labs  Lab 11/16/20 0453 11/18/20 0406  NA 132* 133*  K 4.4 4.2  CL 94* 97*  CO2 28 28  GLUCOSE 112* 153*  BUN 42* 40*  CREATININE 2.54* 2.40*  CALCIUM 8.7* 8.6*  PHOS 2.7 2.2*     Liver Function Tests: Recent Labs  Lab 11/16/20 0453 11/18/20 0406  ALBUMIN 1.8* 1.7*    No results for input(s): LIPASE, AMYLASE in the last 168 hours. No results for input(s): AMMONIA in the last 168 hours.  CBC: Recent Labs  Lab 11/16/20 0453 11/18/20 0406 11/19/20 0331  WBC 9.9 9.1 7.5  HGB 8.5* 7.0* 8.2*  HCT 28.5* 24.2* 28.0*  MCV 81.4 82.3 82.1  PLT 224 200 188     Cardiac Enzymes: No results for input(s): CKTOTAL, CKMB, CKMBINDEX, TROPONINI in the last 168 hours.  BNP: Invalid input(s): POCBNP  CBG: No results for input(s): GLUCAP in the last 168 hours.  Microbiology: Results for orders placed or performed during the hospital encounter of 07/17/20  Culture, blood (routine x 2)     Status: None   Collection Time: 07/27/20 11:17 AM   Specimen: BLOOD RIGHT ARM  Result Value Ref Range Status   Specimen Description BLOOD RIGHT ARM  Final   Special Requests   Final    BOTTLES DRAWN AEROBIC ONLY Blood Culture results may not be  optimal due to an inadequate volume of blood received in culture bottles   Culture   Final    NO GROWTH 5 DAYS Performed at [facility], [place] [address] [place], [place] [address]    Report Status 08/01/2020 FINAL  Final  Culture, blood (routine x 2)     Status: None   Collection Time: 07/27/20 11:23 AM   Specimen: BLOOD RIGHT HAND  Result Value Ref Range Status   Specimen Description BLOOD RIGHT HAND  Final   Special Requests   Final    BOTTLES DRAWN AEROBIC ONLY Blood Culture results may not be optimal due to an inadequate volume of blood received in culture bottles   Culture   Final    NO GROWTH 5 DAYS Performed at [facility], [place] [address], [place], [place] [address]    Report Status 08/01/2020 FINAL  Final  Culture, blood (routine x 2)     Status: None   Collection Time: 08/01/20  2:25 PM   Specimen: BLOOD RIGHT HAND  Result Value Ref Range Status   Specimen Description BLOOD RIGHT HAND  Final   Special Requests   Final    BOTTLES DRAWN AEROBIC AND ANAEROBIC Blood Culture results may not be optimal due to an inadequate volume of blood received in culture bottles   Culture  Final    NO GROWTH 5 DAYS Performed at [facility], [place] [address], [place], [place] [address]    Report Status 08/06/2020 FINAL  Final  Culture, blood (routine x 2)     Status: None   Collection Time: 08/01/20  2:39 PM   Specimen: BLOOD RIGHT ARM  Result Value Ref Range Status   Specimen Description BLOOD RIGHT ARM  Final   Special Requests   Final    BOTTLES DRAWN AEROBIC ONLY Blood Culture results may not be optimal due to an inadequate volume of blood received in culture bottles   Culture   Final    NO GROWTH 5 DAYS Performed at [facility], [place] [address] [place], [place] [address]    Report Status 08/06/2020 FINAL  Final  Culture, Respiratory w Gram Stain     Status: None   Collection Time: 08/01/20  2:55 PM   Specimen: Tracheal Aspirate; Respiratory  Result  Value Ref Range Status   Specimen Description TRACHEAL ASPIRATE  Final   Special Requests NONE  Final   Gram Stain   Final    FEW WBC PRESENT, PREDOMINANTLY PMN MODERATE GRAM NEGATIVE RODS Performed at [facility], [place] [address] [place], [place] [address]    Culture ABUNDANT PSEUDOMONAS AERUGINOSA  Final   Report Status 08/04/2020 FINAL  Final   Organism ID, Bacteria PSEUDOMONAS AERUGINOSA  Final      Susceptibility   Pseudomonas aeruginosa - MIC*    CEFTAZIDIME <=1 SENSITIVE Sensitive     CIPROFLOXACIN <=0.25 SENSITIVE Sensitive     GENTAMICIN 8 INTERMEDIATE Intermediate     IMIPENEM >=16 RESISTANT Resistant     PIP/TAZO <=4 SENSITIVE Sensitive     CEFEPIME 2 SENSITIVE Sensitive     * ABUNDANT PSEUDOMONAS AERUGINOSA  Culture, Respiratory w Gram Stain     Status: None   Collection Time: 09/17/20  5:21 PM   Specimen: Tracheal Aspirate; Respiratory  Result Value Ref Range Status   Specimen Description TRACHEAL ASPIRATE  Final   Special Requests NONE  Final   Gram Stain   Final    ABUNDANT WBC PRESENT,BOTH PMN AND MONONUCLEAR MODERATE SQUAMOUS EPITHELIAL CELLS PRESENT MODERATE GRAM POSITIVE COCCI MODERATE GRAM NEGATIVE RODS Performed at [facility], [place] [address] [place], [place] [address]    Culture FEW PSEUDOMONAS AERUGINOSA  Final   Report Status 09/22/2020 FINAL  Final   Organism ID, Bacteria PSEUDOMONAS AERUGINOSA  Final      Susceptibility   Pseudomonas aeruginosa - MIC*    CEFTAZIDIME 2 SENSITIVE Sensitive     CIPROFLOXACIN 1 SENSITIVE Sensitive     GENTAMICIN >=16 RESISTANT Resistant     IMIPENEM >=16 RESISTANT Resistant     PIP/TAZO <=4 SENSITIVE Sensitive     * FEW PSEUDOMONAS AERUGINOSA  Culture, blood (routine x 2)     Status: None   Collection Time: 09/18/20 12:05 PM   Specimen: BLOOD RIGHT ARM  Result Value Ref Range Status   Specimen Description BLOOD RIGHT ARM  Final   Special Requests   Final    BOTTLES DRAWN AEROBIC AND ANAEROBIC  Blood Culture adequate volume   Culture   Final    NO GROWTH 5 DAYS Performed at [facility], [address] [place],  [address]    Report Status 09/23/2020 FINAL  Final  Culture, blood (routine x 2)     Status: None   Collection Time: 09/18/20 12:05 PM  Specimen: BLOOD RIGHT HAND  Result Value Ref Range Status   Specimen Description BLOOD RIGHT HAND  Final   Special Requests   Final    BOTTLES DRAWN AEROBIC AND ANAEROBIC Blood Culture adequate volume   Culture   Final    NO GROWTH 5 DAYS Performed at [facility], [address], [place], [place] [address]    Report Status 09/23/2020 FINAL  Final  Culture, Respiratory w Gram Stain     Status: None   Collection Time: 09/29/20 12:06 PM   Specimen: Tracheal Aspirate; Respiratory  Result Value Ref Range Status   Specimen Description TRACHEAL ASPIRATE  Final   Special Requests NONE  Final   Gram Stain   Final    RARE SQUAMOUS EPITHELIAL CELLS PRESENT ABUNDANT WBC PRESENT,BOTH PMN AND MONONUCLEAR ABUNDANT GRAM NEGATIVE RODS    Culture   Final    FEW PSEUDOMONAS AERUGINOSA MULTI-DRUG RESISTANT ORGANISM CRITICAL RESULT CALLED TO, READ BACK BY AND VERIFIED WITH: Enon RN @1145  10/03/20 EB Performed at [facility], [place] [address], [place], [place] [address]    Report Status 10/03/2020 FINAL  Final   Organism ID, Bacteria PSEUDOMONAS AERUGINOSA  Final      Susceptibility   Pseudomonas aeruginosa - MIC*    CEFTAZIDIME 16 INTERMEDIATE Intermediate     CIPROFLOXACIN 0.5 SENSITIVE Sensitive     GENTAMICIN >=16 RESISTANT Resistant     IMIPENEM >=16 RESISTANT Resistant     * FEW PSEUDOMONAS AERUGINOSA  Culture, blood (routine x 2)     Status: Abnormal   Collection Time: 09/29/20  1:07 PM   Specimen: BLOOD RIGHT ARM  Result Value Ref Range Status   Specimen Description BLOOD RIGHT ARM  Final   Special Requests   Final    BOTTLES DRAWN AEROBIC AND ANAEROBIC Blood Culture adequate volume   Culture  Setup  Time   Final    GRAM POSITIVE COCCI IN CHAINS AEROBIC BOTTLE ONLY CRITICAL RESULT CALLED TO, READ BACK BY AND VERIFIED WITH: RN S.SNED AT 1233 ON 09/30/2020 BY T.SAAD. Performed at [facility], [place] [address], [place], [place] [address]    Culture VANCOMYCIN RESISTANT ENTEROCOCCUS (A)  Final   Report Status 10/02/2020 FINAL  Final   Organism ID, Bacteria VANCOMYCIN RESISTANT ENTEROCOCCUS  Final      Susceptibility   Vancomycin resistant enterococcus - MIC*    AMPICILLIN <=2 SENSITIVE Sensitive     VANCOMYCIN >=32 RESISTANT Resistant     GENTAMICIN SYNERGY SENSITIVE Sensitive     LINEZOLID 2 SENSITIVE Sensitive     * VANCOMYCIN RESISTANT ENTEROCOCCUS  Blood Culture ID Panel (Reflexed)     Status: Abnormal   Collection Time: 09/29/20  1:07 PM  Result Value Ref Range Status   Enterococcus faecalis DETECTED (A) NOT DETECTED Final    Comment: CRITICAL RESULT CALLED TO, READ BACK BY AND VERIFIED WITH: RN S.SNED AT 1233 ON 09/30/2020 BY T.SAAD.    Enterococcus Faecium NOT DETECTED NOT DETECTED Final   Listeria monocytogenes NOT DETECTED NOT DETECTED Final   Staphylococcus species NOT DETECTED NOT DETECTED Final   Staphylococcus aureus (BCID) NOT DETECTED NOT DETECTED Final   Staphylococcus epidermidis NOT DETECTED NOT DETECTED Final   Staphylococcus lugdunensis NOT DETECTED NOT DETECTED Final   Streptococcus species NOT DETECTED NOT DETECTED Final   Streptococcus agalactiae NOT DETECTED NOT DETECTED Final   Streptococcus pneumoniae NOT DETECTED NOT DETECTED Final   Streptococcus pyogenes NOT DETECTED NOT DETECTED Final  A.calcoaceticus-baumannii NOT DETECTED NOT DETECTED Final   Bacteroides fragilis NOT DETECTED NOT DETECTED Final   Enterobacterales NOT DETECTED NOT DETECTED Final   Enterobacter cloacae complex NOT DETECTED NOT DETECTED Final   Escherichia coli NOT DETECTED NOT DETECTED Final   Klebsiella aerogenes NOT DETECTED NOT DETECTED Final   Klebsiella oxytoca NOT  DETECTED NOT DETECTED Final   Klebsiella pneumoniae NOT DETECTED NOT DETECTED Final   Proteus species NOT DETECTED NOT DETECTED Final   Salmonella species NOT DETECTED NOT DETECTED Final   Serratia marcescens NOT DETECTED NOT DETECTED Final   Haemophilus influenzae NOT DETECTED NOT DETECTED Final   Neisseria meningitidis NOT DETECTED NOT DETECTED Final   Pseudomonas aeruginosa NOT DETECTED NOT DETECTED Final   Stenotrophomonas maltophilia NOT DETECTED NOT DETECTED Final   Candida albicans NOT DETECTED NOT DETECTED Final   Candida auris NOT DETECTED NOT DETECTED Final   Candida glabrata NOT DETECTED NOT DETECTED Final   Candida krusei NOT DETECTED NOT DETECTED Final   Candida parapsilosis NOT DETECTED NOT DETECTED Final   Candida tropicalis NOT DETECTED NOT DETECTED Final   Cryptococcus neoformans/gattii NOT DETECTED NOT DETECTED Final   Vancomycin resistance DETECTED (A) NOT DETECTED Final    Comment: CRITICAL RESULT CALLED TO, READ BACK BY AND VERIFIED WITH: RN S.SNED AT 1233 ON 09/30/2020 BY T.SAAD. Performed at [facility], [place] [address], [place], [place] [address]   Culture, blood (routine x 2)     Status: None   Collection Time: 09/29/20  1:14 PM   Specimen: BLOOD RIGHT ARM  Result Value Ref Range Status   Specimen Description BLOOD RIGHT ARM  Final   Special Requests   Final    BOTTLES DRAWN AEROBIC AND ANAEROBIC Blood Culture adequate volume   Culture   Final    NO GROWTH 5 DAYS Performed at [facility], [place] [address], [place], [place] [address]    Report Status 10/04/2020 FINAL  Final  Culture, blood (routine x 2)     Status: None   Collection Time: 10/20/20  9:57 AM   Specimen: BLOOD RIGHT HAND  Result Value Ref Range Status   Specimen Description BLOOD RIGHT HAND  Final   Special Requests   Final    BOTTLES DRAWN AEROBIC ONLY Blood Culture adequate volume   Culture   Final    NO GROWTH 5 DAYS Performed at [facility], [place] [address],  [place], [place] [address]    Report Status 10/25/2020 FINAL  Final  Culture, blood (routine x 2)     Status: None   Collection Time: 10/20/20  9:57 AM   Specimen: BLOOD RIGHT HAND  Result Value Ref Range Status   Specimen Description BLOOD RIGHT HAND  Final   Special Requests   Final    BOTTLES DRAWN AEROBIC ONLY Blood Culture adequate volume   Culture   Final    NO GROWTH 5 DAYS Performed at [facility], [place] [address] [place], [place] [address]    Report Status 10/25/2020 FINAL  Final    Coagulation Studies: No results for input(s): LABPROT, INR in the last 72 hours.   Urinalysis: No results for input(s): COLORURINE, LABSPEC, PHURINE, GLUCOSEU, HGBUR, BILIRUBINUR, KETONESUR, PROTEINUR, UROBILINOGEN, NITRITE, LEUKOCYTESUR in the last 72 hours.  Invalid input(s): APPERANCEUR    Imaging: No results found.   Medications:       Assessment/ Plan:  45 y.o. male with a PMHx of ESRD on HD, posterior reversible encephalopathy  syndrome, diabetes mellitus type 2, chronic systolic heart failure, anemia of chronic kidney disease, secondary hyperparathyroidism, malignant hypertension, acute respiratory failure, who was admitted to [facility] on 06/29/2020 for ongoing treatment of acute respiratory failure, posterior reversible encephalopathy syndrome, and end-stage renal disease.   1.  ESRD on HD.  Patient due for hemodialysis treatment today per usual schedule.  Orders have been prepared.  2.  Anemia of chronic kidney disease.  Hemoglobin up to 8.2 posttransfusion.  Maintain the patient on Retacrit.  3.  Secondary hyperparathyroidism.  Repeat serum phosphorus today.  4.  Acute respiratory failure.  Tracheostomy placed 07/17/2020.  Continues to breathe comfortably through tracheostomy.  5.  Hypertension.  Blood pressure this a.m. 197/98.Marland Kitchen  Patient currently maintained on amlodipine, clonidine oral and patch, hydralazine, labetalol, losartan, and terazosin.  We also attempted  to increase ultrafiltration target on Monday.  Unclear as to whether these blood pressures are accurate as he has significant edema in his arms.   Jaterrius Ricketson 11/18/20228:12 AM

## 2020-11-23 NOTE — Progress Notes (Signed)
[facility]  ROUNDING NOTE   Subjective:  Patient sitting up in chair this AM. He continues on MWF dialysis treatments. Blood pressure appears to be improved today.   Objective:  Vital signs in last 24 hours:  Temperature 97.3 pulse 68 respirations 15 blood pressure 126/79  Physical Exam: General:  No acute distress  Head:  Normocephalic, atraumatic.    Eyes:  Anicteric  Neck:  Tracheostomy in place  Lungs:   Scattered rhonchi, normal effort  Heart:  S1S2 no rubs  Abdomen:   Soft, nontender, bowel sounds present  Extremities:  3+ bilateral upper extremity edema, pedal edema noted  Neurologic:  Bilateral hand contractures, not following commands  Skin:  No acute skin rash  Access:  Left upper extremity AV fistula    Basic Metabolic Panel: Recent Labs  Lab 11/18/20 0406  NA 133*  K 4.2  CL 97*  CO2 28  GLUCOSE 153*  BUN 40*  CREATININE 2.40*  CALCIUM 8.6*  PHOS 2.2*     Liver Function Tests: Recent Labs  Lab 11/18/20 0406  ALBUMIN 1.7*    No results for input(s): LIPASE, AMYLASE in the last 168 hours. No results for input(s): AMMONIA in the last 168 hours.  CBC: Recent Labs  Lab 11/18/20 0406 11/19/20 0331  WBC 9.1 7.5  HGB 7.0* 8.2*  HCT 24.2* 28.0*  MCV 82.3 82.1  PLT 200 188     Cardiac Enzymes: No results for input(s): CKTOTAL, CKMB, CKMBINDEX, TROPONINI in the last 168 hours.  BNP: Invalid input(s): POCBNP  CBG: No results for input(s): GLUCAP in the last 168 hours.  Microbiology: Results for orders placed or performed during the hospital encounter of 07/17/20  Culture, blood (routine x 2)     Status: None   Collection Time: 07/27/20 11:17 AM   Specimen: BLOOD RIGHT ARM  Result Value Ref Range Status   Specimen Description BLOOD RIGHT ARM  Final   Special Requests   Final    BOTTLES DRAWN AEROBIC ONLY Blood Culture results may not be optimal due to an inadequate volume of blood received in culture bottles   Culture    Final    NO GROWTH 5 DAYS Performed at [facility], [place] [address], [place], [place] [address]    Report Status 08/01/2020 FINAL  Final  Culture, blood (routine x 2)     Status: None   Collection Time: 07/27/20 11:23 AM   Specimen: BLOOD RIGHT HAND  Result Value Ref Range Status   Specimen Description BLOOD RIGHT HAND  Final   Special Requests   Final    BOTTLES DRAWN AEROBIC ONLY Blood Culture results may not be optimal due to an inadequate volume of blood received in culture bottles   Culture   Final    NO GROWTH 5 DAYS Performed at [facility], [place] [address], [place], [place] [address]    Report Status 08/01/2020 FINAL  Final  Culture, blood (routine x 2)     Status: None   Collection Time: 08/01/20  2:25 PM   Specimen: BLOOD RIGHT HAND  Result Value Ref Range Status   Specimen Description BLOOD RIGHT HAND  Final   Special Requests   Final    BOTTLES DRAWN AEROBIC AND ANAEROBIC Blood Culture results may not be optimal due to an inadequate volume of blood received in culture bottles   Culture   Final    NO GROWTH 5 DAYS Performed at [facility],  [address], [place], [place] [address]    Report Status 08/06/2020 FINAL  Final  Culture, blood (routine x 2)     Status: None   Collection Time: 08/01/20  2:39 PM   Specimen: BLOOD RIGHT ARM  Result Value Ref Range Status   Specimen Description BLOOD RIGHT ARM  Final   Special Requests   Final    BOTTLES DRAWN AEROBIC ONLY Blood Culture results may not be optimal due to an inadequate volume of blood received in culture bottles   Culture   Final    NO GROWTH 5 DAYS Performed at [facility], [place] [address], [place], [place] [address]    Report Status 08/06/2020 FINAL  Final  Culture, Respiratory w Gram Stain     Status: None   Collection Time: 08/01/20  2:55 PM   Specimen: Tracheal Aspirate; Respiratory  Result Value Ref Range Status   Specimen Description TRACHEAL ASPIRATE  Final   Special  Requests NONE  Final   Gram Stain   Final    FEW WBC PRESENT, PREDOMINANTLY PMN MODERATE GRAM NEGATIVE RODS Performed at [facility], [place] [address], [place], [place] [address]    Culture ABUNDANT PSEUDOMONAS AERUGINOSA  Final   Report Status 08/04/2020 FINAL  Final   Organism ID, Bacteria PSEUDOMONAS AERUGINOSA  Final      Susceptibility   Pseudomonas aeruginosa - MIC*    CEFTAZIDIME <=1 SENSITIVE Sensitive     CIPROFLOXACIN <=0.25 SENSITIVE Sensitive     GENTAMICIN 8 INTERMEDIATE Intermediate     IMIPENEM >=16 RESISTANT Resistant     PIP/TAZO <=4 SENSITIVE Sensitive     CEFEPIME 2 SENSITIVE Sensitive     * ABUNDANT PSEUDOMONAS AERUGINOSA  Culture, Respiratory w Gram Stain     Status: None   Collection Time: 09/17/20  5:21 PM   Specimen: Tracheal Aspirate; Respiratory  Result Value Ref Range Status   Specimen Description TRACHEAL ASPIRATE  Final   Special Requests NONE  Final   Gram Stain   Final    ABUNDANT WBC PRESENT,BOTH PMN AND MONONUCLEAR MODERATE SQUAMOUS EPITHELIAL CELLS PRESENT MODERATE GRAM POSITIVE COCCI MODERATE GRAM NEGATIVE RODS Performed at [facility], [place] [address] [place], [place] [address]    Culture FEW PSEUDOMONAS AERUGINOSA  Final   Report Status 09/22/2020 FINAL  Final   Organism ID, Bacteria PSEUDOMONAS AERUGINOSA  Final      Susceptibility   Pseudomonas aeruginosa - MIC*    CEFTAZIDIME 2 SENSITIVE Sensitive     CIPROFLOXACIN 1 SENSITIVE Sensitive     GENTAMICIN >=16 RESISTANT Resistant     IMIPENEM >=16 RESISTANT Resistant     PIP/TAZO <=4 SENSITIVE Sensitive     * FEW PSEUDOMONAS AERUGINOSA  Culture, blood (routine x 2)     Status: None   Collection Time: 09/18/20 12:05 PM   Specimen: BLOOD RIGHT ARM  Result Value Ref Range Status   Specimen Description BLOOD RIGHT ARM  Final   Special Requests   Final    BOTTLES DRAWN AEROBIC AND ANAEROBIC Blood Culture adequate volume   Culture   Final    NO GROWTH 5 DAYS Performed at  [facility], [address] [place], [place] [address]    Report Status 09/23/2020 FINAL  Final  Culture, blood (routine x 2)     Status: None   Collection Time: 09/18/20 12:05 PM   Specimen: BLOOD RIGHT HAND  Result Value Ref Range Status   Specimen  Description BLOOD RIGHT HAND  Final   Special Requests   Final    BOTTLES DRAWN AEROBIC AND ANAEROBIC Blood Culture adequate volume   Culture   Final    NO GROWTH 5 DAYS Performed at [facility], [address] [place], [place] [address]    Report Status 09/23/2020 FINAL  Final  Culture, Respiratory w Gram Stain     Status: None   Collection Time: 09/29/20 12:06 PM   Specimen: Tracheal Aspirate; Respiratory  Result Value Ref Range Status   Specimen Description TRACHEAL ASPIRATE  Final   Special Requests NONE  Final   Gram Stain   Final    RARE SQUAMOUS EPITHELIAL CELLS PRESENT ABUNDANT WBC PRESENT,BOTH PMN AND MONONUCLEAR ABUNDANT GRAM NEGATIVE RODS    Culture   Final    FEW PSEUDOMONAS AERUGINOSA MULTI-DRUG RESISTANT ORGANISM CRITICAL RESULT CALLED TO, READ BACK BY AND VERIFIED WITH: [place] RN @1145  10/03/20 EB Performed at [facility], [place] [address] [place], [place] [address]    Report Status 10/03/2020 FINAL  Final   Organism ID, Bacteria PSEUDOMONAS AERUGINOSA  Final      Susceptibility   Pseudomonas aeruginosa - MIC*    CEFTAZIDIME 16 INTERMEDIATE Intermediate     CIPROFLOXACIN 0.5 SENSITIVE Sensitive     GENTAMICIN >=16 RESISTANT Resistant     IMIPENEM >=16 RESISTANT Resistant     * FEW PSEUDOMONAS AERUGINOSA  Culture, blood (routine x 2)     Status: Abnormal   Collection Time: 09/29/20  1:07 PM   Specimen: BLOOD RIGHT ARM  Result Value Ref Range Status   Specimen Description BLOOD RIGHT ARM  Final   Special Requests   Final    BOTTLES DRAWN AEROBIC AND ANAEROBIC Blood Culture adequate volume   Culture  Setup Time   Final    GRAM POSITIVE COCCI IN CHAINS AEROBIC BOTTLE ONLY CRITICAL  RESULT CALLED TO, READ BACK BY AND VERIFIED WITH: RN S.SNED AT 1233 ON 09/30/2020 BY T.SAAD. Performed at [facility], [place] [address], [place], [place] [address]    Culture VANCOMYCIN RESISTANT ENTEROCOCCUS (A)  Final   Report Status 10/02/2020 FINAL  Final   Organism ID, Bacteria VANCOMYCIN RESISTANT ENTEROCOCCUS  Final      Susceptibility   Vancomycin resistant enterococcus - MIC*    AMPICILLIN <=2 SENSITIVE Sensitive     VANCOMYCIN >=32 RESISTANT Resistant     GENTAMICIN SYNERGY SENSITIVE Sensitive     LINEZOLID 2 SENSITIVE Sensitive     * VANCOMYCIN RESISTANT ENTEROCOCCUS  Blood Culture ID Panel (Reflexed)     Status: Abnormal   Collection Time: 09/29/20  1:07 PM  Result Value Ref Range Status   Enterococcus faecalis DETECTED (A) NOT DETECTED Final    Comment: CRITICAL RESULT CALLED TO, READ BACK BY AND VERIFIED WITH: RN S.SNED AT 1233 ON 09/30/2020 BY T.SAAD.    Enterococcus Faecium NOT DETECTED NOT DETECTED Final   Listeria monocytogenes NOT DETECTED NOT DETECTED Final   Staphylococcus species NOT DETECTED NOT DETECTED Final   Staphylococcus aureus (BCID) NOT DETECTED NOT DETECTED Final   Staphylococcus epidermidis NOT DETECTED NOT DETECTED Final   Staphylococcus lugdunensis NOT DETECTED NOT DETECTED Final   Streptococcus species NOT DETECTED NOT DETECTED Final   Streptococcus agalactiae NOT DETECTED NOT DETECTED Final   Streptococcus pneumoniae NOT DETECTED NOT DETECTED Final   Streptococcus pyogenes NOT DETECTED NOT DETECTED Final   A.calcoaceticus-baumannii NOT DETECTED NOT DETECTED Final   Bacteroides fragilis NOT DETECTED NOT  DETECTED Final   Enterobacterales NOT DETECTED NOT DETECTED Final   Enterobacter cloacae complex NOT DETECTED NOT DETECTED Final   Escherichia coli NOT DETECTED NOT DETECTED Final   Klebsiella aerogenes NOT DETECTED NOT DETECTED Final   Klebsiella oxytoca NOT DETECTED NOT DETECTED Final   Klebsiella pneumoniae NOT DETECTED NOT DETECTED  Final   Proteus species NOT DETECTED NOT DETECTED Final   Salmonella species NOT DETECTED NOT DETECTED Final   Serratia marcescens NOT DETECTED NOT DETECTED Final   Haemophilus influenzae NOT DETECTED NOT DETECTED Final   Neisseria meningitidis NOT DETECTED NOT DETECTED Final   Pseudomonas aeruginosa NOT DETECTED NOT DETECTED Final   Stenotrophomonas maltophilia NOT DETECTED NOT DETECTED Final   Candida albicans NOT DETECTED NOT DETECTED Final   Candida auris NOT DETECTED NOT DETECTED Final   Candida glabrata NOT DETECTED NOT DETECTED Final   Candida krusei NOT DETECTED NOT DETECTED Final   Candida parapsilosis NOT DETECTED NOT DETECTED Final   Candida tropicalis NOT DETECTED NOT DETECTED Final   Cryptococcus neoformans/gattii NOT DETECTED NOT DETECTED Final   Vancomycin resistance DETECTED (A) NOT DETECTED Final    Comment: CRITICAL RESULT CALLED TO, READ BACK BY AND VERIFIED WITH: RN S.SNED AT 1233 ON 09/30/2020 BY T.SAAD. Performed at [facility], [place] [address], [place], [place] [address]   Culture, blood (routine x 2)     Status: None   Collection Time: 09/29/20  1:14 PM   Specimen: BLOOD RIGHT ARM  Result Value Ref Range Status   Specimen Description BLOOD RIGHT ARM  Final   Special Requests   Final    BOTTLES DRAWN AEROBIC AND ANAEROBIC Blood Culture adequate volume   Culture   Final    NO GROWTH 5 DAYS Performed at [facility], [place] [address], [place], [place] [address]    Report Status 10/04/2020 FINAL  Final  Culture, blood (routine x 2)     Status: None   Collection Time: 10/20/20  9:57 AM   Specimen: BLOOD RIGHT HAND  Result Value Ref Range Status   Specimen Description BLOOD RIGHT HAND  Final   Special Requests   Final    BOTTLES DRAWN AEROBIC ONLY Blood Culture adequate volume   Culture   Final    NO GROWTH 5 DAYS Performed at [facility], [place] [address] [place], [place] [address]    Report Status 10/25/2020 FINAL  Final  Culture, blood  (routine x 2)     Status: None   Collection Time: 10/20/20  9:57 AM   Specimen: BLOOD RIGHT HAND  Result Value Ref Range Status   Specimen Description BLOOD RIGHT HAND  Final   Special Requests   Final    BOTTLES DRAWN AEROBIC ONLY Blood Culture adequate volume   Culture   Final    NO GROWTH 5 DAYS Performed at [facility], [place] [address] [place], [place] [address]    Report Status 10/25/2020 FINAL  Final    Coagulation Studies: No results for input(s): LABPROT, INR in the last 72 hours.   Urinalysis: No results for input(s): COLORURINE, LABSPEC, PHURINE, GLUCOSEU, HGBUR, BILIRUBINUR, KETONESUR, PROTEINUR, UROBILINOGEN, NITRITE, LEUKOCYTESUR in the last 72 hours.  Invalid input(s): APPERANCEUR    Imaging: No results found.   Medications:       Assessment/ Plan:  45 y.o. male with a PMHx of ESRD on HD, posterior reversible encephalopathy syndrome, diabetes mellitus type 2, chronic systolic heart failure, anemia of chronic kidney  disease, secondary hyperparathyroidism, malignant hypertension, acute respiratory failure, who was admitted to [facility] on 06/29/2020 for ongoing treatment of acute respiratory failure, posterior reversible encephalopathy syndrome, and end-stage renal disease.   1.  ESRD on HD.  We will plan to perform hemodialysis treatment today.  Usual schedule using left upper extremity AV fistula.  2.  Anemia of chronic kidney disease.  Hemoglobin 8.2 at last check.  Recommend rechecking CBC today.  3.  Secondary hyperparathyroidism.  Continue to monitor bone mineral metabolism parameters.  4.  Acute respiratory failure.  Tracheostomy placed 07/17/2020.  Continues to breathe comfortably through tracheostomy.  5.  Hypertension.  Blood pressure appears improved this a.m. to 126/79.  Patient currently maintained on amlodipine, clonidine oral and patch, hydralazine, labetalol, losartan, and terazosin.     Zaira Iacovelli 11/21/20227:58 AM

## 2020-11-25 LAB — RENAL FUNCTION PANEL
Albumin: 1.6 g/dL — ABNORMAL LOW (ref 3.5–5.0)
Anion gap: 10 (ref 5–15)
BUN: 54 mg/dL — ABNORMAL HIGH (ref 6–20)
CO2: 28 mmol/L (ref 22–32)
Calcium: 8.6 mg/dL — ABNORMAL LOW (ref 8.9–10.3)
Chloride: 93 mmol/L — ABNORMAL LOW (ref 98–111)
Creatinine, Ser: 2.54 mg/dL — ABNORMAL HIGH (ref 0.61–1.24)
GFR, Estimated: 31 mL/min — ABNORMAL LOW (ref >60)
Glucose, Bld: 180 mg/dL — ABNORMAL HIGH (ref 70–99)
Phosphorus: 1.9 mg/dL — ABNORMAL LOW (ref 2.5–4.6)
Potassium: 3.8 mmol/L (ref 3.5–5.1)
Sodium: 131 mmol/L — ABNORMAL LOW (ref 135–145)

## 2020-11-25 LAB — CBC
HCT: 26.4 % — ABNORMAL LOW (ref 39.0–52.0)
Hemoglobin: 8 g/dL — ABNORMAL LOW (ref 13.0–17.0)
MCH: 24.2 pg — ABNORMAL LOW (ref 26.0–34.0)
MCHC: 30.3 g/dL (ref 30.0–36.0)
MCV: 80 fL (ref 80.0–100.0)
Platelets: 176 10*3/uL (ref 150–400)
RBC: 3.3 MIL/uL — ABNORMAL LOW (ref 4.22–5.81)
RDW: 15.4 % (ref 11.5–15.5)
WBC: 12 10*3/uL — ABNORMAL HIGH (ref 4.0–10.5)
nRBC: 0.2 % (ref 0.0–0.2)

## 2020-11-25 LAB — HEPATITIS B CORE ANTIBODY, TOTAL: Hep B Core Total Ab: NONREACTIVE

## 2020-11-25 LAB — HEPATITIS B SURFACE ANTIGEN: Hepatitis B Surface Ag: NONREACTIVE

## 2020-11-25 NOTE — Progress Notes (Signed)
[facility]  ROUNDING NOTE   Subjective:  Patient laying in bed this AM. Due for hemodialysis treatment later today. Recently blood pressure has been under better control with a blood pressure 147/79.   Objective:  Vital signs in last 24 hours:  Temperature 97 pulse 62 respirations 22 blood pressure 147/79  Physical Exam: General:  No acute distress  Head:  Normocephalic, atraumatic.    Eyes:  Anicteric  Neck:  Tracheostomy in place  Lungs:   Scattered rhonchi, normal effort  Heart:  S1S2 no rubs  Abdomen:   Soft, nontender, bowel sounds present  Extremities:  3+ bilateral upper extremity edema, pedal edema noted  Neurologic:  Bilateral hand contractures, not following commands  Skin:  No acute skin rash  Access:  Left upper extremity AV fistula    Basic Metabolic Panel: No results for input(s): NA, K, CL, CO2, GLUCOSE, BUN, CREATININE, CALCIUM, MG, PHOS in the last 168 hours.   Liver Function Tests: No results for input(s): AST, ALT, ALKPHOS, BILITOT, PROT, ALBUMIN in the last 168 hours.  No results for input(s): LIPASE, AMYLASE in the last 168 hours. No results for input(s): AMMONIA in the last 168 hours.  CBC: Recent Labs  Lab 11/19/20 0331  WBC 7.5  HGB 8.2*  HCT 28.0*  MCV 82.1  PLT 188     Cardiac Enzymes: No results for input(s): CKTOTAL, CKMB, CKMBINDEX, TROPONINI in the last 168 hours.  BNP: Invalid input(s): POCBNP  CBG: No results for input(s): GLUCAP in the last 168 hours.  Microbiology: Results for orders placed or performed during the hospital encounter of 07/17/20  Culture, blood (routine x 2)     Status: None   Collection Time: 07/27/20 11:17 AM   Specimen: BLOOD RIGHT ARM  Result Value Ref Range Status   Specimen Description BLOOD RIGHT ARM  Final   Special Requests   Final    BOTTLES DRAWN AEROBIC ONLY Blood Culture results may not be optimal due to an inadequate volume of blood received in culture bottles   Culture    Final    NO GROWTH 5 DAYS Performed at [facility], [place] [address] [place], [place] [address]    Report Status 08/01/2020 FINAL  Final  Culture, blood (routine x 2)     Status: None   Collection Time: 07/27/20 11:23 AM   Specimen: BLOOD RIGHT HAND  Result Value Ref Range Status   Specimen Description BLOOD RIGHT HAND  Final   Special Requests   Final    BOTTLES DRAWN AEROBIC ONLY Blood Culture results may not be optimal due to an inadequate volume of blood received in culture bottles   Culture   Final    NO GROWTH 5 DAYS Performed at [facility], [place] [address], [place], [place] [address]    Report Status 08/01/2020 FINAL  Final  Culture, blood (routine x 2)     Status: None   Collection Time: 08/01/20  2:25 PM   Specimen: BLOOD RIGHT HAND  Result Value Ref Range Status   Specimen Description BLOOD RIGHT HAND  Final   Special Requests   Final    BOTTLES DRAWN AEROBIC AND ANAEROBIC Blood Culture results may not be optimal due to an inadequate volume of blood received in culture bottles   Culture   Final    NO GROWTH 5 DAYS Performed at [facility], [place] [address], [place], [place] [address]    Report Status 08/06/2020 FINAL  Final  Culture, blood (routine x 2)     Status: None   Collection Time: 08/01/20  2:39 PM   Specimen: BLOOD RIGHT ARM  Result Value Ref Range Status   Specimen Description BLOOD RIGHT ARM  Final   Special Requests   Final    BOTTLES DRAWN AEROBIC ONLY Blood Culture results may not be optimal due to an inadequate volume of blood received in culture bottles   Culture   Final    NO GROWTH 5 DAYS Performed at [facility], [address] [place], [place] [address]    Report Status 08/06/2020 FINAL  Final  Culture, Respiratory w Gram Stain     Status: None   Collection Time: 08/01/20  2:55 PM   Specimen: Tracheal Aspirate; Respiratory  Result Value Ref Range Status   Specimen Description TRACHEAL ASPIRATE  Final   Special  Requests NONE  Final   Gram Stain   Final    FEW WBC PRESENT, PREDOMINANTLY PMN MODERATE GRAM NEGATIVE RODS Performed at [facility], [place] [address], [place], [place] [address]    Culture ABUNDANT PSEUDOMONAS AERUGINOSA  Final   Report Status 08/04/2020 FINAL  Final   Organism ID, Bacteria PSEUDOMONAS AERUGINOSA  Final      Susceptibility   Pseudomonas aeruginosa - MIC*    CEFTAZIDIME <=1 SENSITIVE Sensitive     CIPROFLOXACIN <=0.25 SENSITIVE Sensitive     GENTAMICIN 8 INTERMEDIATE Intermediate     IMIPENEM >=16 RESISTANT Resistant     PIP/TAZO <=4 SENSITIVE Sensitive     CEFEPIME 2 SENSITIVE Sensitive     * ABUNDANT PSEUDOMONAS AERUGINOSA  Culture, Respiratory w Gram Stain     Status: None   Collection Time: 09/17/20  5:21 PM   Specimen: Tracheal Aspirate; Respiratory  Result Value Ref Range Status   Specimen Description TRACHEAL ASPIRATE  Final   Special Requests NONE  Final   Gram Stain   Final    ABUNDANT WBC PRESENT,BOTH PMN AND MONONUCLEAR MODERATE SQUAMOUS EPITHELIAL CELLS PRESENT MODERATE GRAM POSITIVE COCCI MODERATE GRAM NEGATIVE RODS Performed at [facility], [place] [address] [place], [place] [address]    Culture FEW PSEUDOMONAS AERUGINOSA  Final   Report Status 09/22/2020 FINAL  Final   Organism ID, Bacteria PSEUDOMONAS AERUGINOSA  Final      Susceptibility   Pseudomonas aeruginosa - MIC*    CEFTAZIDIME 2 SENSITIVE Sensitive     CIPROFLOXACIN 1 SENSITIVE Sensitive     GENTAMICIN >=16 RESISTANT Resistant     IMIPENEM >=16 RESISTANT Resistant     PIP/TAZO <=4 SENSITIVE Sensitive     * FEW PSEUDOMONAS AERUGINOSA  Culture, blood (routine x 2)     Status: None   Collection Time: 09/18/20 12:05 PM   Specimen: BLOOD RIGHT ARM  Result Value Ref Range Status   Specimen Description BLOOD RIGHT ARM  Final   Special Requests   Final    BOTTLES DRAWN AEROBIC AND ANAEROBIC Blood Culture adequate volume   Culture   Final    NO GROWTH 5 DAYS Performed at  [facility], [address], [place], [place] [address]    Report Status 09/23/2020 FINAL  Final  Culture, blood (routine x 2)     Status: None   Collection Time: 09/18/20 12:05 PM   Specimen: BLOOD RIGHT HAND  Result Value Ref Range Status   Specimen Description BLOOD RIGHT HAND  Final   Special Requests   Final  BOTTLES DRAWN AEROBIC AND ANAEROBIC Blood Culture adequate volume   Culture   Final    NO GROWTH 5 DAYS Performed at [facility], [place] [address] [place], [place] [address]    Report Status 09/23/2020 FINAL  Final  Culture, Respiratory w Gram Stain     Status: None   Collection Time: 09/29/20 12:06 PM   Specimen: Tracheal Aspirate; Respiratory  Result Value Ref Range Status   Specimen Description TRACHEAL ASPIRATE  Final   Special Requests NONE  Final   Gram Stain   Final    RARE SQUAMOUS EPITHELIAL CELLS PRESENT ABUNDANT WBC PRESENT,BOTH PMN AND MONONUCLEAR ABUNDANT GRAM NEGATIVE RODS    Culture   Final    FEW PSEUDOMONAS AERUGINOSA MULTI-DRUG RESISTANT ORGANISM CRITICAL RESULT CALLED TO, READ BACK BY AND VERIFIED WITH: [place] RN @1145  10/03/20 EB Performed at [facility], [place] [address] [place], [place] [address]    Report Status 10/03/2020 FINAL  Final   Organism ID, Bacteria PSEUDOMONAS AERUGINOSA  Final      Susceptibility   Pseudomonas aeruginosa - MIC*    CEFTAZIDIME 16 INTERMEDIATE Intermediate     CIPROFLOXACIN 0.5 SENSITIVE Sensitive     GENTAMICIN >=16 RESISTANT Resistant     IMIPENEM >=16 RESISTANT Resistant     * FEW PSEUDOMONAS AERUGINOSA  Culture, blood (routine x 2)     Status: Abnormal   Collection Time: 09/29/20  1:07 PM   Specimen: BLOOD RIGHT ARM  Result Value Ref Range Status   Specimen Description BLOOD RIGHT ARM  Final   Special Requests   Final    BOTTLES DRAWN AEROBIC AND ANAEROBIC Blood Culture adequate volume   Culture  Setup Time   Final    GRAM POSITIVE COCCI IN CHAINS AEROBIC BOTTLE ONLY CRITICAL  RESULT CALLED TO, READ BACK BY AND VERIFIED WITH: RN S.SNED AT 1233 ON 09/30/2020 BY T.SAAD. Performed at [facility], [place] [address], [place],  [address]    Culture VANCOMYCIN RESISTANT ENTEROCOCCUS (A)  Final   Report Status 10/02/2020 FINAL  Final   Organism ID, Bacteria VANCOMYCIN RESISTANT ENTEROCOCCUS  Final      Susceptibility   Vancomycin resistant enterococcus - MIC*    AMPICILLIN <=2 SENSITIVE Sensitive     VANCOMYCIN >=32 RESISTANT Resistant     GENTAMICIN SYNERGY SENSITIVE Sensitive     LINEZOLID 2 SENSITIVE Sensitive     * VANCOMYCIN RESISTANT ENTEROCOCCUS  Blood Culture ID Panel (Reflexed)     Status: Abnormal   Collection Time: 09/29/20  1:07 PM  Result Value Ref Range Status   Enterococcus faecalis DETECTED (A) NOT DETECTED Final    Comment: CRITICAL RESULT CALLED TO, READ BACK BY AND VERIFIED WITH: RN S.SNED AT 1233 ON 09/30/2020 BY T.SAAD.    Enterococcus Faecium NOT DETECTED NOT DETECTED Final   Listeria monocytogenes NOT DETECTED NOT DETECTED Final   Staphylococcus species NOT DETECTED NOT DETECTED Final   Staphylococcus aureus (BCID) NOT DETECTED NOT DETECTED Final   Staphylococcus epidermidis NOT DETECTED NOT DETECTED Final   Staphylococcus lugdunensis NOT DETECTED NOT DETECTED Final   Streptococcus species NOT DETECTED NOT DETECTED Final   Streptococcus agalactiae NOT DETECTED NOT DETECTED Final   Streptococcus pneumoniae NOT DETECTED NOT DETECTED Final   Streptococcus pyogenes NOT DETECTED NOT DETECTED Final   A.calcoaceticus-baumannii NOT DETECTED NOT DETECTED Final   Bacteroides fragilis NOT DETECTED NOT DETECTED Final   Enterobacterales NOT DETECTED NOT DETECTED Final   Enterobacter cloacae complex NOT  DETECTED NOT DETECTED Final   Escherichia coli NOT DETECTED NOT DETECTED Final   Klebsiella aerogenes NOT DETECTED NOT DETECTED Final   Klebsiella oxytoca NOT DETECTED NOT DETECTED Final   Klebsiella pneumoniae NOT DETECTED NOT DETECTED  Final   Proteus species NOT DETECTED NOT DETECTED Final   Salmonella species NOT DETECTED NOT DETECTED Final   Serratia marcescens NOT DETECTED NOT DETECTED Final   Haemophilus influenzae NOT DETECTED NOT DETECTED Final   Neisseria meningitidis NOT DETECTED NOT DETECTED Final   Pseudomonas aeruginosa NOT DETECTED NOT DETECTED Final   Stenotrophomonas maltophilia NOT DETECTED NOT DETECTED Final   Candida albicans NOT DETECTED NOT DETECTED Final   Candida auris NOT DETECTED NOT DETECTED Final   Candida glabrata NOT DETECTED NOT DETECTED Final   Candida krusei NOT DETECTED NOT DETECTED Final   Candida parapsilosis NOT DETECTED NOT DETECTED Final   Candida tropicalis NOT DETECTED NOT DETECTED Final   Cryptococcus neoformans/gattii NOT DETECTED NOT DETECTED Final   Vancomycin resistance DETECTED (A) NOT DETECTED Final    Comment: CRITICAL RESULT CALLED TO, READ BACK BY AND VERIFIED WITH: RN S.SNED AT 1233 ON 09/30/2020 BY T.SAAD. Performed at [facility], [place] [address] [place], [place] [address]   Culture, blood (routine x 2)     Status: None   Collection Time: 09/29/20  1:14 PM   Specimen: BLOOD RIGHT ARM  Result Value Ref Range Status   Specimen Description BLOOD RIGHT ARM  Final   Special Requests   Final    BOTTLES DRAWN AEROBIC AND ANAEROBIC Blood Culture adequate volume   Culture   Final    NO GROWTH 5 DAYS Performed at [facility], [place] [address], [place], [place] [address]    Report Status 10/04/2020 FINAL  Final  Culture, blood (routine x 2)     Status: None   Collection Time: 10/20/20  9:57 AM   Specimen: BLOOD RIGHT HAND  Result Value Ref Range Status   Specimen Description BLOOD RIGHT HAND  Final   Special Requests   Final    BOTTLES DRAWN AEROBIC ONLY Blood Culture adequate volume   Culture   Final    NO GROWTH 5 DAYS Performed at [facility], [place] [address], [place], [place] [address]    Report Status 10/25/2020 FINAL  Final  Culture, blood  (routine x 2)     Status: None   Collection Time: 10/20/20  9:57 AM   Specimen: BLOOD RIGHT HAND  Result Value Ref Range Status   Specimen Description BLOOD RIGHT HAND  Final   Special Requests   Final    BOTTLES DRAWN AEROBIC ONLY Blood Culture adequate volume   Culture   Final    NO GROWTH 5 DAYS Performed at [facility], [place] [address] [place],  [address]    Report Status 10/25/2020 FINAL  Final    Coagulation Studies: No results for input(s): LABPROT, INR in the last 72 hours.   Urinalysis: No results for input(s): COLORURINE, LABSPEC, PHURINE, GLUCOSEU, HGBUR, BILIRUBINUR, KETONESUR, PROTEINUR, UROBILINOGEN, NITRITE, LEUKOCYTESUR in the last 72 hours.  Invalid input(s): APPERANCEUR    Imaging: No results found.   Medications:       Assessment/ Plan:  45 y.o. male with a PMHx of ESRD on HD, posterior reversible encephalopathy syndrome, diabetes mellitus type 2, chronic systolic heart failure, anemia of chronic kidney disease, secondary hyperparathyroidism, malignant hypertension, acute respiratory failure, who was admitted to [facility] on 06/29/2020  for ongoing treatment of acute respiratory failure, posterior reversible encephalopathy syndrome, and end-stage renal disease.   1.  ESRD on HD.  We will plan to perform hemodialysis treatment today.  Usual schedule using left upper extremity AV fistula.  We will maintain the patient on MWF dialysis treatment schedule.  Therefore he is due for dialysis treatment today.  2.  Anemia of chronic kidney disease.  No new labs today.  He will need continued monitoring of his CBC.  3.  Secondary hyperparathyroidism.  Recommend rechecking serum phosphorus 7.  4.  Acute respiratory failure.  Tracheostomy placed 07/17/2020.  Continues to breathe comfortably through tracheostomy.  5.  Hypertension.  Blood pressures under better control this week.  Patient currently maintained on amlodipine, clonidine oral and  patch, hydralazine, labetalol, losartan, and terazosin.     Jeffery Andrews 11/23/20228:06 AM

## 2020-11-27 ENCOUNTER — Other Ambulatory Visit (HOSPITAL_COMMUNITY): Payer: Medicare Other

## 2020-11-27 LAB — HEPATITIS B SURFACE ANTIBODY, QUANTITATIVE: Hep B S AB Quant (Post): 147 m[IU]/mL (ref >9.9)

## 2020-11-27 LAB — CBC
HCT: 26.2 % — ABNORMAL LOW (ref 39.0–52.0)
Hemoglobin: 7.6 g/dL — ABNORMAL LOW (ref 13.0–17.0)
MCH: 23.5 pg — ABNORMAL LOW (ref 26.0–34.0)
MCHC: 29 g/dL — ABNORMAL LOW (ref 30.0–36.0)
MCV: 81.1 fL (ref 80.0–100.0)
Platelets: 160 10*3/uL (ref 150–400)
RBC: 3.23 MIL/uL — ABNORMAL LOW (ref 4.22–5.81)
RDW: 15.4 % (ref 11.5–15.5)
WBC: 13.1 10*3/uL — ABNORMAL HIGH (ref 4.0–10.5)
nRBC: 0 % (ref 0.0–0.2)

## 2020-11-27 LAB — RENAL FUNCTION PANEL
Albumin: 1.6 g/dL — ABNORMAL LOW (ref 3.5–5.0)
Anion gap: 9 (ref 5–15)
BUN: 52 mg/dL — ABNORMAL HIGH (ref 6–20)
CO2: 28 mmol/L (ref 22–32)
Calcium: 8.6 mg/dL — ABNORMAL LOW (ref 8.9–10.3)
Chloride: 96 mmol/L — ABNORMAL LOW (ref 98–111)
Creatinine, Ser: 2.54 mg/dL — ABNORMAL HIGH (ref 0.61–1.24)
GFR, Estimated: 31 mL/min — ABNORMAL LOW (ref >60)
Glucose, Bld: 126 mg/dL — ABNORMAL HIGH (ref 70–99)
Phosphorus: 2.3 mg/dL — ABNORMAL LOW (ref 2.5–4.6)
Potassium: 3.8 mmol/L (ref 3.5–5.1)
Sodium: 133 mmol/L — ABNORMAL LOW (ref 135–145)

## 2020-11-27 NOTE — Progress Notes (Signed)
[facility]  ROUNDING NOTE   Subjective:  Patient seen and evaluated during hemodialysis patient seen and evaluated at bedside. Patient currently sitting up in chair. Appears to be sleeping. Due for hemodialysis treatment today.   Objective:  Vital signs in last 24 hours:  Temperature 98 pulse 60 respirations 13 blood pressure 145/76  Physical Exam: General:  No acute distress  Head:  Normocephalic, atraumatic.    Eyes:  Anicteric  Neck:  Tracheostomy in place  Lungs:   Scattered rhonchi, normal effort  Heart:  S1S2 no rubs  Abdomen:   Soft, nontender, bowel sounds present  Extremities:  3+ bilateral upper extremity edema, pedal edema noted  Neurologic:  Bilateral hand contractures, not following commands  Skin:  No acute skin rash  Access:  Left upper extremity AV fistula    Basic Metabolic Panel: Recent Labs  Lab 11/25/20 0745 11/27/20 0811  NA 131* 133*  K 3.8 3.8  CL 93* 96*  CO2 28 28  GLUCOSE 180* 126*  BUN 54* 52*  CREATININE 2.54* 2.54*  CALCIUM 8.6* 8.6*  PHOS 1.9* 2.3*     Liver Function Tests: Recent Labs  Lab 11/25/20 0745 11/27/20 0811  ALBUMIN 1.6* 1.6*    No results for input(s): LIPASE, AMYLASE in the last 168 hours. No results for input(s): AMMONIA in the last 168 hours.  CBC: Recent Labs  Lab 11/25/20 0745 11/27/20 0811  WBC 12.0* 13.1*  HGB 8.0* 7.6*  HCT 26.4* 26.2*  MCV 80.0 81.1  PLT 176 160     Cardiac Enzymes: No results for input(s): CKTOTAL, CKMB, CKMBINDEX, TROPONINI in the last 168 hours.  BNP: Invalid input(s): POCBNP  CBG: No results for input(s): GLUCAP in the last 168 hours.  Microbiology: Results for orders placed or performed during the hospital encounter of 07/17/20  Culture, blood (routine x 2)     Status: None   Collection Time: 07/27/20 11:17 AM   Specimen: BLOOD RIGHT ARM  Result Value Ref Range Status   Specimen Description BLOOD RIGHT ARM  Final   Special Requests   Final     BOTTLES DRAWN AEROBIC ONLY Blood Culture results may not be optimal due to an inadequate volume of blood received in culture bottles   Culture   Final    NO GROWTH 5 DAYS Performed at [facility], [place] [address] [place], [place] [address]    Report Status 08/01/2020 FINAL  Final  Culture, blood (routine x 2)     Status: None   Collection Time: 07/27/20 11:23 AM   Specimen: BLOOD RIGHT HAND  Result Value Ref Range Status   Specimen Description BLOOD RIGHT HAND  Final   Special Requests   Final    BOTTLES DRAWN AEROBIC ONLY Blood Culture results may not be optimal due to an inadequate volume of blood received in culture bottles   Culture   Final    NO GROWTH 5 DAYS Performed at [facility], [place] [address], [place], [place] [address]    Report Status 08/01/2020 FINAL  Final  Culture, blood (routine x 2)     Status: None   Collection Time: 08/01/20  2:25 PM   Specimen: BLOOD RIGHT HAND  Result Value Ref Range Status   Specimen Description BLOOD RIGHT HAND  Final   Special Requests   Final    BOTTLES DRAWN AEROBIC AND ANAEROBIC Blood Culture results may not be optimal due to an inadequate volume of blood received in  culture bottles   Culture   Final    NO GROWTH 5 DAYS Performed at [facility], [place] [address], [name], [place] [address]    Report Status 08/06/2020 FINAL  Final  Culture, blood (routine x 2)     Status: None   Collection Time: 08/01/20  2:39 PM   Specimen: BLOOD RIGHT ARM  Result Value Ref Range Status   Specimen Description BLOOD RIGHT ARM  Final   Special Requests   Final    BOTTLES DRAWN AEROBIC ONLY Blood Culture results may not be optimal due to an inadequate volume of blood received in culture bottles   Culture   Final    NO GROWTH 5 DAYS Performed at [facility], [place] [address], [place], [place] [address]    Report Status 08/06/2020 FINAL  Final  Culture, Respiratory w Gram Stain     Status: None   Collection Time: 08/01/20   2:55 PM   Specimen: Tracheal Aspirate; Respiratory  Result Value Ref Range Status   Specimen Description TRACHEAL ASPIRATE  Final   Special Requests NONE  Final   Gram Stain   Final    FEW WBC PRESENT, PREDOMINANTLY PMN MODERATE GRAM NEGATIVE RODS Performed at [facility], [place] [address], [place], [place] [address]    Culture ABUNDANT PSEUDOMONAS AERUGINOSA  Final   Report Status 08/04/2020 FINAL  Final   Organism ID, Bacteria PSEUDOMONAS AERUGINOSA  Final      Susceptibility   Pseudomonas aeruginosa - MIC*    CEFTAZIDIME <=1 SENSITIVE Sensitive     CIPROFLOXACIN <=0.25 SENSITIVE Sensitive     GENTAMICIN 8 INTERMEDIATE Intermediate     IMIPENEM >=16 RESISTANT Resistant     PIP/TAZO <=4 SENSITIVE Sensitive     CEFEPIME 2 SENSITIVE Sensitive     * ABUNDANT PSEUDOMONAS AERUGINOSA  Culture, Respiratory w Gram Stain     Status: None   Collection Time: 09/17/20  5:21 PM   Specimen: Tracheal Aspirate; Respiratory  Result Value Ref Range Status   Specimen Description TRACHEAL ASPIRATE  Final   Special Requests NONE  Final   Gram Stain   Final    ABUNDANT WBC PRESENT,BOTH PMN AND MONONUCLEAR MODERATE SQUAMOUS EPITHELIAL CELLS PRESENT MODERATE GRAM POSITIVE COCCI MODERATE GRAM NEGATIVE RODS Performed at [facility], [place] [address] [place], [place] [address]    Culture FEW PSEUDOMONAS AERUGINOSA  Final   Report Status 09/22/2020 FINAL  Final   Organism ID, Bacteria PSEUDOMONAS AERUGINOSA  Final      Susceptibility   Pseudomonas aeruginosa - MIC*    CEFTAZIDIME 2 SENSITIVE Sensitive     CIPROFLOXACIN 1 SENSITIVE Sensitive     GENTAMICIN >=16 RESISTANT Resistant     IMIPENEM >=16 RESISTANT Resistant     PIP/TAZO <=4 SENSITIVE Sensitive     * FEW PSEUDOMONAS AERUGINOSA  Culture, blood (routine x 2)     Status: None   Collection Time: 09/18/20 12:05 PM   Specimen: BLOOD RIGHT ARM  Result Value Ref Range Status   Specimen Description BLOOD RIGHT ARM  Final   Special  Requests   Final    BOTTLES DRAWN AEROBIC AND ANAEROBIC Blood Culture adequate volume   Culture   Final    NO GROWTH 5 DAYS Performed at [facility], [address] [place],  [address]    Report Status 09/23/2020 FINAL  Final  Culture, blood (routine x 2)     Status: None  Collection Time: 09/18/20 12:05 PM   Specimen: BLOOD RIGHT HAND  Result Value Ref Range Status   Specimen Description BLOOD RIGHT HAND  Final   Special Requests   Final    BOTTLES DRAWN AEROBIC AND ANAEROBIC Blood Culture adequate volume   Culture   Final    NO GROWTH 5 DAYS Performed at [facility], [address] [place], [place] [address]    Report Status 09/23/2020 FINAL  Final  Culture, Respiratory w Gram Stain     Status: None   Collection Time: 09/29/20 12:06 PM   Specimen: Tracheal Aspirate; Respiratory  Result Value Ref Range Status   Specimen Description TRACHEAL ASPIRATE  Final   Special Requests NONE  Final   Gram Stain   Final    RARE SQUAMOUS EPITHELIAL CELLS PRESENT ABUNDANT WBC PRESENT,BOTH PMN AND MONONUCLEAR ABUNDANT GRAM NEGATIVE RODS    Culture   Final    FEW PSEUDOMONAS AERUGINOSA MULTI-DRUG RESISTANT ORGANISM CRITICAL RESULT CALLED TO, READ BACK BY AND VERIFIED WITH: [place] RN @1145  10/03/20 EB Performed at [facility], [place] [address] [place], [place] [address]    Report Status 10/03/2020 FINAL  Final   Organism ID, Bacteria PSEUDOMONAS AERUGINOSA  Final      Susceptibility   Pseudomonas aeruginosa - MIC*    CEFTAZIDIME 16 INTERMEDIATE Intermediate     CIPROFLOXACIN 0.5 SENSITIVE Sensitive     GENTAMICIN >=16 RESISTANT Resistant     IMIPENEM >=16 RESISTANT Resistant     * FEW PSEUDOMONAS AERUGINOSA  Culture, blood (routine x 2)     Status: Abnormal   Collection Time: 09/29/20  1:07 PM   Specimen: BLOOD RIGHT ARM  Result Value Ref Range Status   Specimen Description BLOOD RIGHT ARM  Final   Special Requests   Final    BOTTLES DRAWN AEROBIC AND  ANAEROBIC Blood Culture adequate volume   Culture  Setup Time   Final    GRAM POSITIVE COCCI IN CHAINS AEROBIC BOTTLE ONLY CRITICAL RESULT CALLED TO, READ BACK BY AND VERIFIED WITH: RN S.SNED AT 1233 ON 09/30/2020 BY T.SAAD. Performed at [facility], [place] [address], [place], [place] [address]    Culture VANCOMYCIN RESISTANT ENTEROCOCCUS (A)  Final   Report Status 10/02/2020 FINAL  Final   Organism ID, Bacteria VANCOMYCIN RESISTANT ENTEROCOCCUS  Final      Susceptibility   Vancomycin resistant enterococcus - MIC*    AMPICILLIN <=2 SENSITIVE Sensitive     VANCOMYCIN >=32 RESISTANT Resistant     GENTAMICIN SYNERGY SENSITIVE Sensitive     LINEZOLID 2 SENSITIVE Sensitive     * VANCOMYCIN RESISTANT ENTEROCOCCUS  Blood Culture ID Panel (Reflexed)     Status: Abnormal   Collection Time: 09/29/20  1:07 PM  Result Value Ref Range Status   Enterococcus faecalis DETECTED (A) NOT DETECTED Final    Comment: CRITICAL RESULT CALLED TO, READ BACK BY AND VERIFIED WITH: RN S.SNED AT 1233 ON 09/30/2020 BY T.SAAD.    Enterococcus Faecium NOT DETECTED NOT DETECTED Final   Listeria monocytogenes NOT DETECTED NOT DETECTED Final   Staphylococcus species NOT DETECTED NOT DETECTED Final   Staphylococcus aureus (BCID) NOT DETECTED NOT DETECTED Final   Staphylococcus epidermidis NOT DETECTED NOT DETECTED Final   Staphylococcus lugdunensis NOT DETECTED NOT DETECTED Final   Streptococcus species NOT DETECTED NOT DETECTED Final   Streptococcus agalactiae NOT DETECTED NOT DETECTED Final   Streptococcus pneumoniae NOT DETECTED NOT DETECTED Final   Streptococcus pyogenes  NOT DETECTED NOT DETECTED Final   A.calcoaceticus-baumannii NOT DETECTED NOT DETECTED Final   Bacteroides fragilis NOT DETECTED NOT DETECTED Final   Enterobacterales NOT DETECTED NOT DETECTED Final   Enterobacter cloacae complex NOT DETECTED NOT DETECTED Final   Escherichia coli NOT DETECTED NOT DETECTED Final   Klebsiella aerogenes NOT  DETECTED NOT DETECTED Final   Klebsiella oxytoca NOT DETECTED NOT DETECTED Final   Klebsiella pneumoniae NOT DETECTED NOT DETECTED Final   Proteus species NOT DETECTED NOT DETECTED Final   Salmonella species NOT DETECTED NOT DETECTED Final   Serratia marcescens NOT DETECTED NOT DETECTED Final   Haemophilus influenzae NOT DETECTED NOT DETECTED Final   Neisseria meningitidis NOT DETECTED NOT DETECTED Final   Pseudomonas aeruginosa NOT DETECTED NOT DETECTED Final   Stenotrophomonas maltophilia NOT DETECTED NOT DETECTED Final   Candida albicans NOT DETECTED NOT DETECTED Final   Candida auris NOT DETECTED NOT DETECTED Final   Candida glabrata NOT DETECTED NOT DETECTED Final   Candida krusei NOT DETECTED NOT DETECTED Final   Candida parapsilosis NOT DETECTED NOT DETECTED Final   Candida tropicalis NOT DETECTED NOT DETECTED Final   Cryptococcus neoformans/gattii NOT DETECTED NOT DETECTED Final   Vancomycin resistance DETECTED (A) NOT DETECTED Final    Comment: CRITICAL RESULT CALLED TO, READ BACK BY AND VERIFIED WITH: RN S.SNED AT 1233 ON 09/30/2020 BY T.SAAD. Performed at [facility], [place] [address] [place], [place] [address]   Culture, blood (routine x 2)     Status: None   Collection Time: 09/29/20  1:14 PM   Specimen: BLOOD RIGHT ARM  Result Value Ref Range Status   Specimen Description BLOOD RIGHT ARM  Final   Special Requests   Final    BOTTLES DRAWN AEROBIC AND ANAEROBIC Blood Culture adequate volume   Culture   Final    NO GROWTH 5 DAYS Performed at [facility], [place] [address], [place], [place] [address]    Report Status 10/04/2020 FINAL  Final  Culture, blood (routine x 2)     Status: None   Collection Time: 10/20/20  9:57 AM   Specimen: BLOOD RIGHT HAND  Result Value Ref Range Status   Specimen Description BLOOD RIGHT HAND  Final   Special Requests   Final    BOTTLES DRAWN AEROBIC ONLY Blood Culture adequate volume   Culture   Final    NO GROWTH 5  DAYS Performed at [facility], [place] [address], [place], [place] [address]    Report Status 10/25/2020 FINAL  Final  Culture, blood (routine x 2)     Status: None   Collection Time: 10/20/20  9:57 AM   Specimen: BLOOD RIGHT HAND  Result Value Ref Range Status   Specimen Description BLOOD RIGHT HAND  Final   Special Requests   Final    BOTTLES DRAWN AEROBIC ONLY Blood Culture adequate volume   Culture   Final    NO GROWTH 5 DAYS Performed at [facility], [place] [address], [place], [place] [address]    Report Status 10/25/2020 FINAL  Final    Coagulation Studies: No results for input(s): LABPROT, INR in the last 72 hours.   Urinalysis: No results for input(s): COLORURINE, LABSPEC, PHURINE, GLUCOSEU, HGBUR, BILIRUBINUR, KETONESUR, PROTEINUR, UROBILINOGEN, NITRITE, LEUKOCYTESUR in the last 72 hours.  Invalid input(s): APPERANCEUR    Imaging: No results found.   Medications:       Assessment/ Plan:  45 y.o. male with a PMHx  of ESRD on HD, posterior reversible encephalopathy syndrome, diabetes mellitus type 2, chronic systolic heart failure, anemia of chronic kidney disease, secondary hyperparathyroidism, malignant hypertension, acute respiratory failure, who was admitted to [facility] on 06/29/2020 for ongoing treatment of acute respiratory failure, posterior reversible encephalopathy syndrome, and end-stage renal disease.   1.  ESRD on HD.  Maintain the patient on MWF dialysis treatment schedule.  Due for dialysis treatment today.  2.  Anemia of chronic kidney disease.  Hemoglobin continues to periodically drop despite Retacrit.  Patient has required multiple transfusions this admission.  3.  Secondary hyperparathyroidism.  Phosphorus up slightly to 2.3.  Continue to monitor.  4.  Acute respiratory failure.  Tracheostomy placed 07/17/2020.  Continues to breathe comfortably through tracheostomy.  5.  Hypertension.  Blood pressure currently 145/76.  Continue  current antihypertensive regimen.   Sharmayne Jablon 11/25/202210:45 AM

## 2020-11-30 LAB — CBC
HCT: 25.1 % — ABNORMAL LOW (ref 39.0–52.0)
Hemoglobin: 7.6 g/dL — ABNORMAL LOW (ref 13.0–17.0)
MCH: 23.9 pg — ABNORMAL LOW (ref 26.0–34.0)
MCHC: 30.3 g/dL (ref 30.0–36.0)
MCV: 78.9 fL — ABNORMAL LOW (ref 80.0–100.0)
Platelets: 156 10*3/uL (ref 150–400)
RBC: 3.18 MIL/uL — ABNORMAL LOW (ref 4.22–5.81)
RDW: 15.6 % — ABNORMAL HIGH (ref 11.5–15.5)
WBC: 19.2 10*3/uL — ABNORMAL HIGH (ref 4.0–10.5)
nRBC: 0 % (ref 0.0–0.2)

## 2020-11-30 LAB — RENAL FUNCTION PANEL
Albumin: 1.5 g/dL — ABNORMAL LOW (ref 3.5–5.0)
Anion gap: 11 (ref 5–15)
BUN: 78 mg/dL — ABNORMAL HIGH (ref 6–20)
CO2: 25 mmol/L (ref 22–32)
Calcium: 8.4 mg/dL — ABNORMAL LOW (ref 8.9–10.3)
Chloride: 95 mmol/L — ABNORMAL LOW (ref 98–111)
Creatinine, Ser: 2.93 mg/dL — ABNORMAL HIGH (ref 0.61–1.24)
GFR, Estimated: 26 mL/min — ABNORMAL LOW (ref >60)
Glucose, Bld: 113 mg/dL — ABNORMAL HIGH (ref 70–99)
Phosphorus: 3.3 mg/dL (ref 2.5–4.6)
Potassium: 4.8 mmol/L (ref 3.5–5.1)
Sodium: 131 mmol/L — ABNORMAL LOW (ref 135–145)

## 2020-11-30 NOTE — Progress Notes (Signed)
[facility]  ROUNDING NOTE   Subjective:  Patient seen and evaluated this AM. Patient sitting upright in a chair. He will be due for hemodialysis treatment today. Disposition remains in question.   Objective:  Vital signs in last 24 hours:  Temperature 97.2 pulse 61 respiration 17 blood pressure 166/98  Physical Exam: General:  No acute distress  Head:  Normocephalic, atraumatic.    Eyes:  Anicteric  Neck:  Tracheostomy in place  Lungs:   Scattered rhonchi, normal effort  Heart:  S1S2 no rubs  Abdomen:   Soft, nontender, bowel sounds present  Extremities:  3+ bilateral upper extremity edema, pedal edema noted  Neurologic:  Bilateral hand contractures, not following commands  Skin:  No acute skin rash  Access:  Left upper extremity AV fistula    Basic Metabolic Panel: Recent Labs  Lab 11/25/20 0745 11/27/20 0811  NA 131* 133*  K 3.8 3.8  CL 93* 96*  CO2 28 28  GLUCOSE 180* 126*  BUN 54* 52*  CREATININE 2.54* 2.54*  CALCIUM 8.6* 8.6*  PHOS 1.9* 2.3*     Liver Function Tests: Recent Labs  Lab 11/25/20 0745 11/27/20 0811  ALBUMIN 1.6* 1.6*    No results for input(s): LIPASE, AMYLASE in the last 168 hours. No results for input(s): AMMONIA in the last 168 hours.  CBC: Recent Labs  Lab 11/25/20 0745 11/27/20 0811  WBC 12.0* 13.1*  HGB 8.0* 7.6*  HCT 26.4* 26.2*  MCV 80.0 81.1  PLT 176 160     Cardiac Enzymes: No results for input(s): CKTOTAL, CKMB, CKMBINDEX, TROPONINI in the last 168 hours.  BNP: Invalid input(s): POCBNP  CBG: No results for input(s): GLUCAP in the last 168 hours.  Microbiology: Results for orders placed or performed during the hospital encounter of 07/17/20  Culture, blood (routine x 2)     Status: None   Collection Time: 07/27/20 11:17 AM   Specimen: BLOOD RIGHT ARM  Result Value Ref Range Status   Specimen Description BLOOD RIGHT ARM  Final   Special Requests   Final    BOTTLES DRAWN AEROBIC ONLY Blood  Culture results may not be optimal due to an inadequate volume of blood received in culture bottles   Culture   Final    NO GROWTH 5 DAYS Performed at [facility], [place] [address], [place], [place] [address]    Report Status 08/01/2020 FINAL  Final  Culture, blood (routine x 2)     Status: None   Collection Time: 07/27/20 11:23 AM   Specimen: BLOOD RIGHT HAND  Result Value Ref Range Status   Specimen Description BLOOD RIGHT HAND  Final   Special Requests   Final    BOTTLES DRAWN AEROBIC ONLY Blood Culture results may not be optimal due to an inadequate volume of blood received in culture bottles   Culture   Final    NO GROWTH 5 DAYS Performed at [facility], [place] [address] [place], [place] [address]    Report Status 08/01/2020 FINAL  Final  Culture, blood (routine x 2)     Status: None   Collection Time: 08/01/20  2:25 PM   Specimen: BLOOD RIGHT HAND  Result Value Ref Range Status   Specimen Description BLOOD RIGHT HAND  Final   Special Requests   Final    BOTTLES DRAWN AEROBIC AND ANAEROBIC Blood Culture results may not be optimal due to an inadequate volume of blood received in culture bottles  Culture   Final    NO GROWTH 5 DAYS Performed at [facility], [place] [address], [place], [place] [address]    Report Status 08/06/2020 FINAL  Final  Culture, blood (routine x 2)     Status: None   Collection Time: 08/01/20  2:39 PM   Specimen: BLOOD RIGHT ARM  Result Value Ref Range Status   Specimen Description BLOOD RIGHT ARM  Final   Special Requests   Final    BOTTLES DRAWN AEROBIC ONLY Blood Culture results may not be optimal due to an inadequate volume of blood received in culture bottles   Culture   Final    NO GROWTH 5 DAYS Performed at [facility], [place] [address] [place], [place] [address]    Report Status 08/06/2020 FINAL  Final  Culture, Respiratory w Gram Stain     Status: None   Collection Time: 08/01/20  2:55 PM   Specimen: Tracheal  Aspirate; Respiratory  Result Value Ref Range Status   Specimen Description TRACHEAL ASPIRATE  Final   Special Requests NONE  Final   Gram Stain   Final    FEW WBC PRESENT, PREDOMINANTLY PMN MODERATE GRAM NEGATIVE RODS Performed at [facility], [place] [address] [place], [place] [address]    Culture ABUNDANT PSEUDOMONAS AERUGINOSA  Final   Report Status 08/04/2020 FINAL  Final   Organism ID, Bacteria PSEUDOMONAS AERUGINOSA  Final      Susceptibility   Pseudomonas aeruginosa - MIC*    CEFTAZIDIME <=1 SENSITIVE Sensitive     CIPROFLOXACIN <=0.25 SENSITIVE Sensitive     GENTAMICIN 8 INTERMEDIATE Intermediate     IMIPENEM >=16 RESISTANT Resistant     PIP/TAZO <=4 SENSITIVE Sensitive     CEFEPIME 2 SENSITIVE Sensitive     * ABUNDANT PSEUDOMONAS AERUGINOSA  Culture, Respiratory w Gram Stain     Status: None   Collection Time: 09/17/20  5:21 PM   Specimen: Tracheal Aspirate; Respiratory  Result Value Ref Range Status   Specimen Description TRACHEAL ASPIRATE  Final   Special Requests NONE  Final   Gram Stain   Final    ABUNDANT WBC PRESENT,BOTH PMN AND MONONUCLEAR MODERATE SQUAMOUS EPITHELIAL CELLS PRESENT MODERATE GRAM POSITIVE COCCI MODERATE GRAM NEGATIVE RODS Performed at [facility], [place] [address] [place], [place] [address]    Culture FEW PSEUDOMONAS AERUGINOSA  Final   Report Status 09/22/2020 FINAL  Final   Organism ID, Bacteria PSEUDOMONAS AERUGINOSA  Final      Susceptibility   Pseudomonas aeruginosa - MIC*    CEFTAZIDIME 2 SENSITIVE Sensitive     CIPROFLOXACIN 1 SENSITIVE Sensitive     GENTAMICIN >=16 RESISTANT Resistant     IMIPENEM >=16 RESISTANT Resistant     PIP/TAZO <=4 SENSITIVE Sensitive     * FEW PSEUDOMONAS AERUGINOSA  Culture, blood (routine x 2)     Status: None   Collection Time: 09/18/20 12:05 PM   Specimen: BLOOD RIGHT ARM  Result Value Ref Range Status   Specimen Description BLOOD RIGHT ARM  Final   Special Requests   Final    BOTTLES  DRAWN AEROBIC AND ANAEROBIC Blood Culture adequate volume   Culture   Final    NO GROWTH 5 DAYS Performed at [facility], [address], [place],  [address]    Report Status 09/23/2020 FINAL  Final  Culture, blood (routine x 2)     Status: None   Collection Time: 09/18/20  12:05 PM   Specimen: BLOOD RIGHT HAND  Result Value Ref Range Status   Specimen Description BLOOD RIGHT HAND  Final   Special Requests   Final    BOTTLES DRAWN AEROBIC AND ANAEROBIC Blood Culture adequate volume   Culture   Final    NO GROWTH 5 DAYS Performed at [facility], [address] [place], [place] [address]    Report Status 09/23/2020 FINAL  Final  Culture, Respiratory w Gram Stain     Status: None   Collection Time: 09/29/20 12:06 PM   Specimen: Tracheal Aspirate; Respiratory  Result Value Ref Range Status   Specimen Description TRACHEAL ASPIRATE  Final   Special Requests NONE  Final   Gram Stain   Final    RARE SQUAMOUS EPITHELIAL CELLS PRESENT ABUNDANT WBC PRESENT,BOTH PMN AND MONONUCLEAR ABUNDANT GRAM NEGATIVE RODS    Culture   Final    FEW PSEUDOMONAS AERUGINOSA MULTI-DRUG RESISTANT ORGANISM CRITICAL RESULT CALLED TO, READ BACK BY AND VERIFIED WITH: [place] RN @1145  10/03/20 EB Performed at [facility], [place] [address], [place], [place] [address]    Report Status 10/03/2020 FINAL  Final   Organism ID, Bacteria PSEUDOMONAS AERUGINOSA  Final      Susceptibility   Pseudomonas aeruginosa - MIC*    CEFTAZIDIME 16 INTERMEDIATE Intermediate     CIPROFLOXACIN 0.5 SENSITIVE Sensitive     GENTAMICIN >=16 RESISTANT Resistant     IMIPENEM >=16 RESISTANT Resistant     * FEW PSEUDOMONAS AERUGINOSA  Culture, blood (routine x 2)     Status: Abnormal   Collection Time: 09/29/20  1:07 PM   Specimen: BLOOD RIGHT ARM  Result Value Ref Range Status   Specimen Description BLOOD RIGHT ARM  Final   Special Requests   Final    BOTTLES DRAWN AEROBIC AND ANAEROBIC Blood Culture  adequate volume   Culture  Setup Time   Final    GRAM POSITIVE COCCI IN CHAINS AEROBIC BOTTLE ONLY CRITICAL RESULT CALLED TO, READ BACK BY AND VERIFIED WITH: RN S.SNED AT 1233 ON 09/30/2020 BY T.SAAD. Performed at [facility], [place] [address], [place], [place] [address]    Culture VANCOMYCIN RESISTANT ENTEROCOCCUS (A)  Final   Report Status 10/02/2020 FINAL  Final   Organism ID, Bacteria VANCOMYCIN RESISTANT ENTEROCOCCUS  Final      Susceptibility   Vancomycin resistant enterococcus - MIC*    AMPICILLIN <=2 SENSITIVE Sensitive     VANCOMYCIN >=32 RESISTANT Resistant     GENTAMICIN SYNERGY SENSITIVE Sensitive     LINEZOLID 2 SENSITIVE Sensitive     * VANCOMYCIN RESISTANT ENTEROCOCCUS  Blood Culture ID Panel (Reflexed)     Status: Abnormal   Collection Time: 09/29/20  1:07 PM  Result Value Ref Range Status   Enterococcus faecalis DETECTED (A) NOT DETECTED Final    Comment: CRITICAL RESULT CALLED TO, READ BACK BY AND VERIFIED WITH: RN S.SNED AT 1233 ON 09/30/2020 BY T.SAAD.    Enterococcus Faecium NOT DETECTED NOT DETECTED Final   Listeria monocytogenes NOT DETECTED NOT DETECTED Final   Staphylococcus species NOT DETECTED NOT DETECTED Final   Staphylococcus aureus (BCID) NOT DETECTED NOT DETECTED Final   Staphylococcus epidermidis NOT DETECTED NOT DETECTED Final   Staphylococcus lugdunensis NOT DETECTED NOT DETECTED Final   Streptococcus species NOT DETECTED NOT DETECTED Final   Streptococcus agalactiae NOT DETECTED NOT DETECTED Final   Streptococcus pneumoniae NOT DETECTED NOT DETECTED Final   Streptococcus pyogenes NOT DETECTED NOT  DETECTED Final   A.calcoaceticus-baumannii NOT DETECTED NOT DETECTED Final   Bacteroides fragilis NOT DETECTED NOT DETECTED Final   Enterobacterales NOT DETECTED NOT DETECTED Final   Enterobacter cloacae complex NOT DETECTED NOT DETECTED Final   Escherichia coli NOT DETECTED NOT DETECTED Final   Klebsiella aerogenes NOT DETECTED NOT DETECTED  Final   Klebsiella oxytoca NOT DETECTED NOT DETECTED Final   Klebsiella pneumoniae NOT DETECTED NOT DETECTED Final   Proteus species NOT DETECTED NOT DETECTED Final   Salmonella species NOT DETECTED NOT DETECTED Final   Serratia marcescens NOT DETECTED NOT DETECTED Final   Haemophilus influenzae NOT DETECTED NOT DETECTED Final   Neisseria meningitidis NOT DETECTED NOT DETECTED Final   Pseudomonas aeruginosa NOT DETECTED NOT DETECTED Final   Stenotrophomonas maltophilia NOT DETECTED NOT DETECTED Final   Candida albicans NOT DETECTED NOT DETECTED Final   Candida auris NOT DETECTED NOT DETECTED Final   Candida glabrata NOT DETECTED NOT DETECTED Final   Candida krusei NOT DETECTED NOT DETECTED Final   Candida parapsilosis NOT DETECTED NOT DETECTED Final   Candida tropicalis NOT DETECTED NOT DETECTED Final   Cryptococcus neoformans/gattii NOT DETECTED NOT DETECTED Final   Vancomycin resistance DETECTED (A) NOT DETECTED Final    Comment: CRITICAL RESULT CALLED TO, READ BACK BY AND VERIFIED WITH: RN S.SNED AT 1233 ON 09/30/2020 BY T.SAAD. Performed at [facility], [place] [address], [place], [place] [address]   Culture, blood (routine x 2)     Status: None   Collection Time: 09/29/20  1:14 PM   Specimen: BLOOD RIGHT ARM  Result Value Ref Range Status   Specimen Description BLOOD RIGHT ARM  Final   Special Requests   Final    BOTTLES DRAWN AEROBIC AND ANAEROBIC Blood Culture adequate volume   Culture   Final    NO GROWTH 5 DAYS Performed at [facility], [place] [address] [place], [place] [address]    Report Status 10/04/2020 FINAL  Final  Culture, blood (routine x 2)     Status: None   Collection Time: 10/20/20  9:57 AM   Specimen: BLOOD RIGHT HAND  Result Value Ref Range Status   Specimen Description BLOOD RIGHT HAND  Final   Special Requests   Final    BOTTLES DRAWN AEROBIC ONLY Blood Culture adequate volume   Culture   Final    NO GROWTH 5 DAYS Performed at [facility], [place] [address] [place], [place] [address]    Report Status 10/25/2020 FINAL  Final  Culture, blood (routine x 2)     Status: None   Collection Time: 10/20/20  9:57 AM   Specimen: BLOOD RIGHT HAND  Result Value Ref Range Status   Specimen Description BLOOD RIGHT HAND  Final   Special Requests   Final    BOTTLES DRAWN AEROBIC ONLY Blood Culture adequate volume   Culture   Final    NO GROWTH 5 DAYS Performed at [facility], [place] [address] [place], [place] [address]    Report Status 10/25/2020 FINAL  Final  Culture, blood (routine x 2)     Status: None (Preliminary result)   Collection Time: 11/27/20  9:33 PM   Specimen: BLOOD RIGHT HAND  Result Value Ref Range Status   Specimen Description BLOOD RIGHT HAND  Final   Special Requests AEROBIC BOTTLE ONLY Blood Culture adequate volume  Final   Culture   Final    NO GROWTH 3 DAYS Performed at  [facility], [place] [address] [place], [place] [address]    Report Status PENDING  Incomplete  Culture, blood (routine x 2)     Status: None (Preliminary result)   Collection Time: 11/27/20  9:33 PM   Specimen: BLOOD  Result Value Ref Range Status   Specimen Description BLOOD RIGHT ANTECUBITAL  Final   Special Requests   Final    BOTTLES DRAWN AEROBIC AND ANAEROBIC Blood Culture adequate volume   Culture   Final    NO GROWTH 3 DAYS Performed at [facility], [place] [address], [place], [place] [address]    Report Status PENDING  Incomplete    Coagulation Studies: No results for input(s): LABPROT, INR in the last 72 hours.   Urinalysis: No results for input(s): COLORURINE, LABSPEC, PHURINE, GLUCOSEU, HGBUR, BILIRUBINUR, KETONESUR, PROTEINUR, UROBILINOGEN, NITRITE, LEUKOCYTESUR in the last 72 hours.  Invalid input(s): APPERANCEUR    Imaging: No results found.   Medications:       Assessment/ Plan:  45 y.o. male with a PMHx of ESRD on HD, posterior reversible encephalopathy syndrome, diabetes mellitus  type 2, chronic systolic heart failure, anemia of chronic kidney disease, secondary hyperparathyroidism, malignant hypertension, acute respiratory failure, who was admitted to [facility] on 06/29/2020 for ongoing treatment of acute respiratory failure, posterior reversible encephalopathy syndrome, and end-stage renal disease.   1.  ESRD on HD.  Patient will be due for hemodialysis treatment today per his usual schedule.  2.  Anemia of chronic kidney disease.  Hemoglobin down a bit to 7.6 at last check.  Maintain the patient on Retacrit and monitor CBC.  3.  Secondary hyperparathyroidism.  Repeat serum phosphorus today.  4.  Acute respiratory failure.  Tracheostomy placed 07/17/2020.  Patient continues to be on T-piece collar.  5.  Hypertension.  Blood pressure bit higher this a.m. at 166/98.  Continue current antihypertensive regimen.   [place] 11/28/20228:04 AM

## 2020-12-01 LAB — CBC
HCT: 23.7 % — ABNORMAL LOW (ref 39.0–52.0)
Hemoglobin: 7.2 g/dL — ABNORMAL LOW (ref 13.0–17.0)
MCH: 24 pg — ABNORMAL LOW (ref 26.0–34.0)
MCHC: 30.4 g/dL (ref 30.0–36.0)
MCV: 79 fL — ABNORMAL LOW (ref 80.0–100.0)
Platelets: 151 10*3/uL (ref 150–400)
RBC: 3 MIL/uL — ABNORMAL LOW (ref 4.22–5.81)
RDW: 15.6 % — ABNORMAL HIGH (ref 11.5–15.5)
WBC: 16.4 10*3/uL — ABNORMAL HIGH (ref 4.0–10.5)
nRBC: 0 % (ref 0.0–0.2)

## 2020-12-02 ENCOUNTER — Other Ambulatory Visit (HOSPITAL_COMMUNITY): Payer: Medicare Other

## 2020-12-02 LAB — CULTURE, BLOOD (ROUTINE X 2)
Culture: NO GROWTH
Culture: NO GROWTH
Special Requests: ADEQUATE
Special Requests: ADEQUATE

## 2020-12-02 LAB — CBC
HCT: 24.6 % — ABNORMAL LOW (ref 39.0–52.0)
Hemoglobin: 7.5 g/dL — ABNORMAL LOW (ref 13.0–17.0)
MCH: 24.2 pg — ABNORMAL LOW (ref 26.0–34.0)
MCHC: 30.5 g/dL (ref 30.0–36.0)
MCV: 79.4 fL — ABNORMAL LOW (ref 80.0–100.0)
Platelets: 158 10*3/uL (ref 150–400)
RBC: 3.1 MIL/uL — ABNORMAL LOW (ref 4.22–5.81)
RDW: 15.9 % — ABNORMAL HIGH (ref 11.5–15.5)
WBC: 20.3 10*3/uL — ABNORMAL HIGH (ref 4.0–10.5)
nRBC: 0 % (ref 0.0–0.2)

## 2020-12-02 LAB — RENAL FUNCTION PANEL
Albumin: 1.8 g/dL — ABNORMAL LOW (ref 3.5–5.0)
Anion gap: 10 (ref 5–15)
BUN: 66 mg/dL — ABNORMAL HIGH (ref 6–20)
CO2: 29 mmol/L (ref 22–32)
Calcium: 8.5 mg/dL — ABNORMAL LOW (ref 8.9–10.3)
Chloride: 95 mmol/L — ABNORMAL LOW (ref 98–111)
Creatinine, Ser: 2.81 mg/dL — ABNORMAL HIGH (ref 0.61–1.24)
GFR, Estimated: 27 mL/min — ABNORMAL LOW (ref >60)
Glucose, Bld: 210 mg/dL — ABNORMAL HIGH (ref 70–99)
Phosphorus: 2.8 mg/dL (ref 2.5–4.6)
Potassium: 4 mmol/L (ref 3.5–5.1)
Sodium: 134 mmol/L — ABNORMAL LOW (ref 135–145)

## 2020-12-02 NOTE — Progress Notes (Signed)
[facility]  ROUNDING NOTE   Subjective:  Patient sitting up in chair this AM. Continues to have T collar attached to his tracheostomy. Patient due for hemodialysis treatment today. Hemoglobin is drifted down a bit to 7.2.   Objective:  Vital signs in last 24 hours:  Temperature 97 pulse 67 respiration 16 blood pressure 139/78  Physical Exam: General:  No acute distress  Head:  Normocephalic, atraumatic.    Eyes:  Anicteric  Neck:  Tracheostomy in place  Lungs:   Scattered rhonchi, normal effort  Heart:  S1S2 no rubs  Abdomen:   Soft, nontender, bowel sounds present  Extremities:  3+ bilateral upper extremity edema, pedal edema noted  Neurologic:  Bilateral hand contractures, not following commands  Skin:  No acute skin rash  Access:  Left upper extremity AV fistula    Basic Metabolic Panel: Recent Labs  Lab 11/27/20 0811 11/30/20 0948  NA 133* 131*  K 3.8 4.8  CL 96* 95*  CO2 28 25  GLUCOSE 126* 113*  BUN 52* 78*  CREATININE 2.54* 2.93*  CALCIUM 8.6* 8.4*  PHOS 2.3* 3.3     Liver Function Tests: Recent Labs  Lab 11/27/20 0811 11/30/20 0948  ALBUMIN 1.6* 1.5*    No results for input(s): LIPASE, AMYLASE in the last 168 hours. No results for input(s): AMMONIA in the last 168 hours.  CBC: Recent Labs  Lab 11/27/20 0811 11/30/20 2202 12/01/20 0621  WBC 13.1* 19.2* 16.4*  HGB 7.6* 7.6* 7.2*  HCT 26.2* 25.1* 23.7*  MCV 81.1 78.9* 79.0*  PLT 160 156 151     Cardiac Enzymes: No results for input(s): CKTOTAL, CKMB, CKMBINDEX, TROPONINI in the last 168 hours.  BNP: Invalid input(s): POCBNP  CBG: No results for input(s): GLUCAP in the last 168 hours.  Microbiology: Results for orders placed or performed during the hospital encounter of 07/17/20  Culture, blood (routine x 2)     Status: None   Collection Time: 07/27/20 11:17 AM   Specimen: BLOOD RIGHT ARM  Result Value Ref Range Status   Specimen Description BLOOD RIGHT ARM  Final    Special Requests   Final    BOTTLES DRAWN AEROBIC ONLY Blood Culture results may not be optimal due to an inadequate volume of blood received in culture bottles   Culture   Final    NO GROWTH 5 DAYS Performed at [facility], [place] [address], [place], [place] [address]    Report Status 08/01/2020 FINAL  Final  Culture, blood (routine x 2)     Status: None   Collection Time: 07/27/20 11:23 AM   Specimen: BLOOD RIGHT HAND  Result Value Ref Range Status   Specimen Description BLOOD RIGHT HAND  Final   Special Requests   Final    BOTTLES DRAWN AEROBIC ONLY Blood Culture results may not be optimal due to an inadequate volume of blood received in culture bottles   Culture   Final    NO GROWTH 5 DAYS Performed at [facility], [place] [address], [place], [place] [address]    Report Status 08/01/2020 FINAL  Final  Culture, blood (routine x 2)     Status: None   Collection Time: 08/01/20  2:25 PM   Specimen: BLOOD RIGHT HAND  Result Value Ref Range Status   Specimen Description BLOOD RIGHT HAND  Final   Special Requests   Final    BOTTLES DRAWN AEROBIC AND ANAEROBIC Blood Culture results may not be  optimal due to an inadequate volume of blood received in culture bottles   Culture   Final    NO GROWTH 5 DAYS Performed at [facility], [place] [address], [place], [place] [address]    Report Status 08/06/2020 FINAL  Final  Culture, blood (routine x 2)     Status: None   Collection Time: 08/01/20  2:39 PM   Specimen: BLOOD RIGHT ARM  Result Value Ref Range Status   Specimen Description BLOOD RIGHT ARM  Final   Special Requests   Final    BOTTLES DRAWN AEROBIC ONLY Blood Culture results may not be optimal due to an inadequate volume of blood received in culture bottles   Culture   Final    NO GROWTH 5 DAYS Performed at [facility], [place] [address], [place], [place] [address]    Report Status 08/06/2020 FINAL  Final  Culture, Respiratory w Gram Stain     Status: None    Collection Time: 08/01/20  2:55 PM   Specimen: Tracheal Aspirate; Respiratory  Result Value Ref Range Status   Specimen Description TRACHEAL ASPIRATE  Final   Special Requests NONE  Final   Gram Stain   Final    FEW WBC PRESENT, PREDOMINANTLY PMN MODERATE GRAM NEGATIVE RODS Performed at [facility], [place] [address], [place], [place] [address]    Culture ABUNDANT PSEUDOMONAS AERUGINOSA  Final   Report Status 08/04/2020 FINAL  Final   Organism ID, Bacteria PSEUDOMONAS AERUGINOSA  Final      Susceptibility   Pseudomonas aeruginosa - MIC*    CEFTAZIDIME <=1 SENSITIVE Sensitive     CIPROFLOXACIN <=0.25 SENSITIVE Sensitive     GENTAMICIN 8 INTERMEDIATE Intermediate     IMIPENEM >=16 RESISTANT Resistant     PIP/TAZO <=4 SENSITIVE Sensitive     CEFEPIME 2 SENSITIVE Sensitive     * ABUNDANT PSEUDOMONAS AERUGINOSA  Culture, Respiratory w Gram Stain     Status: None   Collection Time: 09/17/20  5:21 PM   Specimen: Tracheal Aspirate; Respiratory  Result Value Ref Range Status   Specimen Description TRACHEAL ASPIRATE  Final   Special Requests NONE  Final   Gram Stain   Final    ABUNDANT WBC PRESENT,BOTH PMN AND MONONUCLEAR MODERATE SQUAMOUS EPITHELIAL CELLS PRESENT MODERATE GRAM POSITIVE COCCI MODERATE GRAM NEGATIVE RODS Performed at [facility], [place] [address] [place], [place] [address]    Culture FEW PSEUDOMONAS AERUGINOSA  Final   Report Status 09/22/2020 FINAL  Final   Organism ID, Bacteria PSEUDOMONAS AERUGINOSA  Final      Susceptibility   Pseudomonas aeruginosa - MIC*    CEFTAZIDIME 2 SENSITIVE Sensitive     CIPROFLOXACIN 1 SENSITIVE Sensitive     GENTAMICIN >=16 RESISTANT Resistant     IMIPENEM >=16 RESISTANT Resistant     PIP/TAZO <=4 SENSITIVE Sensitive     * FEW PSEUDOMONAS AERUGINOSA  Culture, blood (routine x 2)     Status: None   Collection Time: 09/18/20 12:05 PM   Specimen: BLOOD RIGHT ARM  Result Value Ref Range Status   Specimen Description  BLOOD RIGHT ARM  Final   Special Requests   Final    BOTTLES DRAWN AEROBIC AND ANAEROBIC Blood Culture adequate volume   Culture   Final    NO GROWTH 5 DAYS Performed at [facility], [address], [place], [place] [address]    Report Status 09/23/2020 FINAL  Final  Culture, blood (  routine x 2)     Status: None   Collection Time: 09/18/20 12:05 PM   Specimen: BLOOD RIGHT HAND  Result Value Ref Range Status   Specimen Description BLOOD RIGHT HAND  Final   Special Requests   Final    BOTTLES DRAWN AEROBIC AND ANAEROBIC Blood Culture adequate volume   Culture   Final    NO GROWTH 5 DAYS Performed at [facility], [address], [place], [place] [address]    Report Status 09/23/2020 FINAL  Final  Culture, Respiratory w Gram Stain     Status: None   Collection Time: 09/29/20 12:06 PM   Specimen: Tracheal Aspirate; Respiratory  Result Value Ref Range Status   Specimen Description TRACHEAL ASPIRATE  Final   Special Requests NONE  Final   Gram Stain   Final    RARE SQUAMOUS EPITHELIAL CELLS PRESENT ABUNDANT WBC PRESENT,BOTH PMN AND MONONUCLEAR ABUNDANT GRAM NEGATIVE RODS    Culture   Final    FEW PSEUDOMONAS AERUGINOSA MULTI-DRUG RESISTANT ORGANISM CRITICAL RESULT CALLED TO, READ BACK BY AND VERIFIED WITH: Blanchard RN @1145  10/03/20 EB Performed at [facility], [place] [address], [place], [place] [address]    Report Status 10/03/2020 FINAL  Final   Organism ID, Bacteria PSEUDOMONAS AERUGINOSA  Final      Susceptibility   Pseudomonas aeruginosa - MIC*    CEFTAZIDIME 16 INTERMEDIATE Intermediate     CIPROFLOXACIN 0.5 SENSITIVE Sensitive     GENTAMICIN >=16 RESISTANT Resistant     IMIPENEM >=16 RESISTANT Resistant     * FEW PSEUDOMONAS AERUGINOSA  Culture, blood (routine x 2)     Status: Abnormal   Collection Time: 09/29/20  1:07 PM   Specimen: BLOOD RIGHT ARM  Result Value Ref Range Status   Specimen Description BLOOD RIGHT ARM  Final   Special Requests    Final    BOTTLES DRAWN AEROBIC AND ANAEROBIC Blood Culture adequate volume   Culture  Setup Time   Final    GRAM POSITIVE COCCI IN CHAINS AEROBIC BOTTLE ONLY CRITICAL RESULT CALLED TO, READ BACK BY AND VERIFIED WITH: RN S.SNED AT 1233 ON 09/30/2020 BY T.SAAD. Performed at [facility], [place] [address], [place], [place] [address]    Culture VANCOMYCIN RESISTANT ENTEROCOCCUS (A)  Final   Report Status 10/02/2020 FINAL  Final   Organism ID, Bacteria VANCOMYCIN RESISTANT ENTEROCOCCUS  Final      Susceptibility   Vancomycin resistant enterococcus - MIC*    AMPICILLIN <=2 SENSITIVE Sensitive     VANCOMYCIN >=32 RESISTANT Resistant     GENTAMICIN SYNERGY SENSITIVE Sensitive     LINEZOLID 2 SENSITIVE Sensitive     * VANCOMYCIN RESISTANT ENTEROCOCCUS  Blood Culture ID Panel (Reflexed)     Status: Abnormal   Collection Time: 09/29/20  1:07 PM  Result Value Ref Range Status   Enterococcus faecalis DETECTED (A) NOT DETECTED Final    Comment: CRITICAL RESULT CALLED TO, READ BACK BY AND VERIFIED WITH: RN S.SNED AT 1233 ON 09/30/2020 BY T.SAAD.    Enterococcus Faecium NOT DETECTED NOT DETECTED Final   Listeria monocytogenes NOT DETECTED NOT DETECTED Final   Staphylococcus species NOT DETECTED NOT DETECTED Final   Staphylococcus aureus (BCID) NOT DETECTED NOT DETECTED Final   Staphylococcus epidermidis NOT DETECTED NOT DETECTED Final   Staphylococcus lugdunensis NOT DETECTED NOT DETECTED Final   Streptococcus species NOT DETECTED NOT DETECTED Final   Streptococcus agalactiae NOT DETECTED NOT DETECTED Final  Streptococcus pneumoniae NOT DETECTED NOT DETECTED Final   Streptococcus pyogenes NOT DETECTED NOT DETECTED Final   A.calcoaceticus-baumannii NOT DETECTED NOT DETECTED Final   Bacteroides fragilis NOT DETECTED NOT DETECTED Final   Enterobacterales NOT DETECTED NOT DETECTED Final   Enterobacter cloacae complex NOT DETECTED NOT DETECTED Final   Escherichia coli NOT DETECTED NOT DETECTED  Final   Klebsiella aerogenes NOT DETECTED NOT DETECTED Final   Klebsiella oxytoca NOT DETECTED NOT DETECTED Final   Klebsiella pneumoniae NOT DETECTED NOT DETECTED Final   Proteus species NOT DETECTED NOT DETECTED Final   Salmonella species NOT DETECTED NOT DETECTED Final   Serratia marcescens NOT DETECTED NOT DETECTED Final   Haemophilus influenzae NOT DETECTED NOT DETECTED Final   Neisseria meningitidis NOT DETECTED NOT DETECTED Final   Pseudomonas aeruginosa NOT DETECTED NOT DETECTED Final   Stenotrophomonas maltophilia NOT DETECTED NOT DETECTED Final   Candida albicans NOT DETECTED NOT DETECTED Final   Candida auris NOT DETECTED NOT DETECTED Final   Candida glabrata NOT DETECTED NOT DETECTED Final   Candida krusei NOT DETECTED NOT DETECTED Final   Candida parapsilosis NOT DETECTED NOT DETECTED Final   Candida tropicalis NOT DETECTED NOT DETECTED Final   Cryptococcus neoformans/gattii NOT DETECTED NOT DETECTED Final   Vancomycin resistance DETECTED (A) NOT DETECTED Final    Comment: CRITICAL RESULT CALLED TO, READ BACK BY AND VERIFIED WITH: RN S.SNED AT 1233 ON 09/30/2020 BY T.SAAD. Performed at [facility], [place] [address], [place], [place] [address]   Culture, blood (routine x 2)     Status: None   Collection Time: 09/29/20  1:14 PM   Specimen: BLOOD RIGHT ARM  Result Value Ref Range Status   Specimen Description BLOOD RIGHT ARM  Final   Special Requests   Final    BOTTLES DRAWN AEROBIC AND ANAEROBIC Blood Culture adequate volume   Culture   Final    NO GROWTH 5 DAYS Performed at [facility], [place] [address], [place], [place] [address]    Report Status 10/04/2020 FINAL  Final  Culture, blood (routine x 2)     Status: None   Collection Time: 10/20/20  9:57 AM   Specimen: BLOOD RIGHT HAND  Result Value Ref Range Status   Specimen Description BLOOD RIGHT HAND  Final   Special Requests   Final    BOTTLES DRAWN AEROBIC ONLY Blood Culture adequate volume   Culture    Final    NO GROWTH 5 DAYS Performed at [facility], [place] [address] [place], [place] [address]    Report Status 10/25/2020 FINAL  Final  Culture, blood (routine x 2)     Status: None   Collection Time: 10/20/20  9:57 AM   Specimen: BLOOD RIGHT HAND  Result Value Ref Range Status   Specimen Description BLOOD RIGHT HAND  Final   Special Requests   Final    BOTTLES DRAWN AEROBIC ONLY Blood Culture adequate volume   Culture   Final    NO GROWTH 5 DAYS Performed at [facility], [place] [address] [place], [place] [address]    Report Status 10/25/2020 FINAL  Final  Culture, blood (routine x 2)     Status: None   Collection Time: 11/27/20  9:33 PM   Specimen: BLOOD RIGHT HAND  Result Value Ref Range Status   Specimen Description BLOOD RIGHT HAND  Final   Special Requests AEROBIC BOTTLE ONLY Blood Culture adequate volume  Final   Culture  Final    NO GROWTH 5 DAYS Performed at [facility], [place] [address] [place], [place] [address]    Report Status 12/02/2020 FINAL  Final  Culture, blood (routine x 2)     Status: None   Collection Time: 11/27/20  9:33 PM   Specimen: BLOOD  Result Value Ref Range Status   Specimen Description BLOOD RIGHT ANTECUBITAL  Final   Special Requests   Final    BOTTLES DRAWN AEROBIC AND ANAEROBIC Blood Culture adequate volume   Culture   Final    NO GROWTH 5 DAYS Performed at [facility], [place] [address] [place], [place] [address]    Report Status 12/02/2020 FINAL  Final    Coagulation Studies: No results for input(s): LABPROT, INR in the last 72 hours.   Urinalysis: No results for input(s): COLORURINE, LABSPEC, PHURINE, GLUCOSEU, HGBUR, BILIRUBINUR, KETONESUR, PROTEINUR, UROBILINOGEN, NITRITE, LEUKOCYTESUR in the last 72 hours.  Invalid input(s): APPERANCEUR    Imaging: No results found.   Medications:       Assessment/ Plan:  45 y.o. male with a PMHx of ESRD on HD, posterior reversible encephalopathy  syndrome, diabetes mellitus type 2, chronic systolic heart failure, anemia of chronic kidney disease, secondary hyperparathyroidism, malignant hypertension, acute respiratory failure, who was admitted to [facility] on 06/29/2020 for ongoing treatment of acute respiratory failure, posterior reversible encephalopathy syndrome, and end-stage renal disease.   1.  ESRD on HD.  Patient due for hemodialysis treatment today.  Orders have been prepared.  2.  Anemia of chronic kidney disease.  Hemoglobin is drifted down to 7.2.  Consider blood transfusion for hemoglobin of 7 or less.  3.  Secondary hyperparathyroidism.  Phosphorus acceptable at last check at 3.3.  Continue to monitor.  4.  Acute respiratory failure.  Tracheostomy placed 07/17/2020.  Patient continues to be on T-piece collar.  5.  Hypertension.  Blood pressure earlier this a.m. was 139/78.  He continue current antihypertensive regimen.   Tinnie Kunin 11/30/20228:20 AM

## 2020-12-04 LAB — RENAL FUNCTION PANEL
Albumin: 1.6 g/dL — ABNORMAL LOW (ref 3.5–5.0)
Anion gap: 10 (ref 5–15)
BUN: 63 mg/dL — ABNORMAL HIGH (ref 6–20)
CO2: 28 mmol/L (ref 22–32)
Calcium: 8.5 mg/dL — ABNORMAL LOW (ref 8.9–10.3)
Chloride: 93 mmol/L — ABNORMAL LOW (ref 98–111)
Creatinine, Ser: 2.67 mg/dL — ABNORMAL HIGH (ref 0.61–1.24)
GFR, Estimated: 29 mL/min — ABNORMAL LOW (ref >60)
Glucose, Bld: 195 mg/dL — ABNORMAL HIGH (ref 70–99)
Phosphorus: 3 mg/dL (ref 2.5–4.6)
Potassium: 3.8 mmol/L (ref 3.5–5.1)
Sodium: 131 mmol/L — ABNORMAL LOW (ref 135–145)

## 2020-12-04 LAB — CBC
HCT: 24.1 % — ABNORMAL LOW (ref 39.0–52.0)
Hemoglobin: 7.4 g/dL — ABNORMAL LOW (ref 13.0–17.0)
MCH: 24.2 pg — ABNORMAL LOW (ref 26.0–34.0)
MCHC: 30.7 g/dL (ref 30.0–36.0)
MCV: 78.8 fL — ABNORMAL LOW (ref 80.0–100.0)
Platelets: 172 10*3/uL (ref 150–400)
RBC: 3.06 MIL/uL — ABNORMAL LOW (ref 4.22–5.81)
RDW: 15.9 % — ABNORMAL HIGH (ref 11.5–15.5)
WBC: 14.3 10*3/uL — ABNORMAL HIGH (ref 4.0–10.5)
nRBC: 0 % (ref 0.0–0.2)

## 2020-12-04 NOTE — Progress Notes (Signed)
[facility]  ROUNDING NOTE   Subjective:  No significant change in pt status. Sitting up in chair. Eyes open. Not following commands.    Objective:  Vital signs in last 24 hours:  Temperature 97.4 pulse 52 respiration 35 blood pressure 153/88  Physical Exam: General:  No acute distress  Head:  Normocephalic, atraumatic.    Eyes:  Anicteric  Neck:  Tracheostomy in place  Lungs:   Scattered rhonchi, normal effort  Heart:  S1S2 no rubs  Abdomen:   Soft, nontender, bowel sounds present  Extremities:  3+ bilateral upper extremity edema, pedal edema noted  Neurologic:  Bilateral hand contractures, not following commands  Skin:  No acute skin rash  Access:  Left upper extremity AV fistula    Basic Metabolic Panel: Recent Labs  Lab 11/30/20 0948 12/02/20 1122  NA 131* 134*  K 4.8 4.0  CL 95* 95*  CO2 25 29  GLUCOSE 113* 210*  BUN 78* 66*  CREATININE 2.93* 2.81*  CALCIUM 8.4* 8.5*  PHOS 3.3 2.8     Liver Function Tests: Recent Labs  Lab 11/30/20 0948 12/02/20 1122  ALBUMIN 1.5* 1.8*    No results for input(s): LIPASE, AMYLASE in the last 168 hours. No results for input(s): AMMONIA in the last 168 hours.  CBC: Recent Labs  Lab 11/30/20 2202 12/01/20 0621 12/02/20 1121  WBC 19.2* 16.4* 20.3*  HGB 7.6* 7.2* 7.5*  HCT 25.1* 23.7* 24.6*  MCV 78.9* 79.0* 79.4*  PLT 156 151 158     Cardiac Enzymes: No results for input(s): CKTOTAL, CKMB, CKMBINDEX, TROPONINI in the last 168 hours.  BNP: Invalid input(s): POCBNP  CBG: No results for input(s): GLUCAP in the last 168 hours.  Microbiology: Results for orders placed or performed during the hospital encounter of 07/17/20  Culture, blood (routine x 2)     Status: None   Collection Time: 07/27/20 11:17 AM   Specimen: BLOOD RIGHT ARM  Result Value Ref Range Status   Specimen Description BLOOD RIGHT ARM  Final   Special Requests   Final    BOTTLES DRAWN AEROBIC ONLY Blood Culture results may  not be optimal due to an inadequate volume of blood received in culture bottles   Culture   Final    NO GROWTH 5 DAYS Performed at [facility], [place] [address], [place], [place] [address]    Report Status 08/01/2020 FINAL  Final  Culture, blood (routine x 2)     Status: None   Collection Time: 07/27/20 11:23 AM   Specimen: BLOOD RIGHT HAND  Result Value Ref Range Status   Specimen Description BLOOD RIGHT HAND  Final   Special Requests   Final    BOTTLES DRAWN AEROBIC ONLY Blood Culture results may not be optimal due to an inadequate volume of blood received in culture bottles   Culture   Final    NO GROWTH 5 DAYS Performed at [facility], [place] [address], [place], [place] [address]    Report Status 08/01/2020 FINAL  Final  Culture, blood (routine x 2)     Status: None   Collection Time: 08/01/20  2:25 PM   Specimen: BLOOD RIGHT HAND  Result Value Ref Range Status   Specimen Description BLOOD RIGHT HAND  Final   Special Requests   Final    BOTTLES DRAWN AEROBIC AND ANAEROBIC Blood Culture results may not be optimal due to an inadequate volume of blood received in culture bottles  Culture   Final    NO GROWTH 5 DAYS Performed at [facility], [place] [address], [place], [place] [address]    Report Status 08/06/2020 FINAL  Final  Culture, blood (routine x 2)     Status: None   Collection Time: 08/01/20  2:39 PM   Specimen: BLOOD RIGHT ARM  Result Value Ref Range Status   Specimen Description BLOOD RIGHT ARM  Final   Special Requests   Final    BOTTLES DRAWN AEROBIC ONLY Blood Culture results may not be optimal due to an inadequate volume of blood received in culture bottles   Culture   Final    NO GROWTH 5 DAYS Performed at [facility], [place] [address], [place], [place] [address]    Report Status 08/06/2020 FINAL  Final  Culture, Respiratory w Gram Stain     Status: None   Collection Time: 08/01/20  2:55 PM   Specimen: Tracheal Aspirate; Respiratory   Result Value Ref Range Status   Specimen Description TRACHEAL ASPIRATE  Final   Special Requests NONE  Final   Gram Stain   Final    FEW WBC PRESENT, PREDOMINANTLY PMN MODERATE GRAM NEGATIVE RODS Performed at [facility], [place] [address], [place], [place] [address]    Culture ABUNDANT PSEUDOMONAS AERUGINOSA  Final   Report Status 08/04/2020 FINAL  Final   Organism ID, Bacteria PSEUDOMONAS AERUGINOSA  Final      Susceptibility   Pseudomonas aeruginosa - MIC*    CEFTAZIDIME <=1 SENSITIVE Sensitive     CIPROFLOXACIN <=0.25 SENSITIVE Sensitive     GENTAMICIN 8 INTERMEDIATE Intermediate     IMIPENEM >=16 RESISTANT Resistant     PIP/TAZO <=4 SENSITIVE Sensitive     CEFEPIME 2 SENSITIVE Sensitive     * ABUNDANT PSEUDOMONAS AERUGINOSA  Culture, Respiratory w Gram Stain     Status: None   Collection Time: 09/17/20  5:21 PM   Specimen: Tracheal Aspirate; Respiratory  Result Value Ref Range Status   Specimen Description TRACHEAL ASPIRATE  Final   Special Requests NONE  Final   Gram Stain   Final    ABUNDANT WBC PRESENT,BOTH PMN AND MONONUCLEAR MODERATE SQUAMOUS EPITHELIAL CELLS PRESENT MODERATE GRAM POSITIVE COCCI MODERATE GRAM NEGATIVE RODS Performed at [facility], [place] [address] [place], [place] [address]    Culture FEW PSEUDOMONAS AERUGINOSA  Final   Report Status 09/22/2020 FINAL  Final   Organism ID, Bacteria PSEUDOMONAS AERUGINOSA  Final      Susceptibility   Pseudomonas aeruginosa - MIC*    CEFTAZIDIME 2 SENSITIVE Sensitive     CIPROFLOXACIN 1 SENSITIVE Sensitive     GENTAMICIN >=16 RESISTANT Resistant     IMIPENEM >=16 RESISTANT Resistant     PIP/TAZO <=4 SENSITIVE Sensitive     * FEW PSEUDOMONAS AERUGINOSA  Culture, blood (routine x 2)     Status: None   Collection Time: 09/18/20 12:05 PM   Specimen: BLOOD RIGHT ARM  Result Value Ref Range Status   Specimen Description BLOOD RIGHT ARM  Final   Special Requests   Final    BOTTLES DRAWN AEROBIC AND  ANAEROBIC Blood Culture adequate volume   Culture   Final    NO GROWTH 5 DAYS Performed at [facility], [address] [place], [place] [address]    Report Status 09/23/2020 FINAL  Final  Culture, blood (routine x 2)     Status: None   Collection Time: 09/18/20  12:05 PM   Specimen: BLOOD RIGHT HAND  Result Value Ref Range Status   Specimen Description BLOOD RIGHT HAND  Final   Special Requests   Final    BOTTLES DRAWN AEROBIC AND ANAEROBIC Blood Culture adequate volume   Culture   Final    NO GROWTH 5 DAYS Performed at [facility], [address] [place], [place] [address]    Report Status 09/23/2020 FINAL  Final  Culture, Respiratory w Gram Stain     Status: None   Collection Time: 09/29/20 12:06 PM   Specimen: Tracheal Aspirate; Respiratory  Result Value Ref Range Status   Specimen Description TRACHEAL ASPIRATE  Final   Special Requests NONE  Final   Gram Stain   Final    RARE SQUAMOUS EPITHELIAL CELLS PRESENT ABUNDANT WBC PRESENT,BOTH PMN AND MONONUCLEAR ABUNDANT GRAM NEGATIVE RODS    Culture   Final    FEW PSEUDOMONAS AERUGINOSA MULTI-DRUG RESISTANT ORGANISM CRITICAL RESULT CALLED TO, READ BACK BY AND VERIFIED WITH: Arvin RN @1145  10/03/20 EB Performed at [facility], [place] [address], [place], [place] [address]    Report Status 10/03/2020 FINAL  Final   Organism ID, Bacteria PSEUDOMONAS AERUGINOSA  Final      Susceptibility   Pseudomonas aeruginosa - MIC*    CEFTAZIDIME 16 INTERMEDIATE Intermediate     CIPROFLOXACIN 0.5 SENSITIVE Sensitive     GENTAMICIN >=16 RESISTANT Resistant     IMIPENEM >=16 RESISTANT Resistant     * FEW PSEUDOMONAS AERUGINOSA  Culture, blood (routine x 2)     Status: Abnormal   Collection Time: 09/29/20  1:07 PM   Specimen: BLOOD RIGHT ARM  Result Value Ref Range Status   Specimen Description BLOOD RIGHT ARM  Final   Special Requests   Final    BOTTLES DRAWN AEROBIC AND ANAEROBIC Blood Culture adequate volume    Culture  Setup Time   Final    GRAM POSITIVE COCCI IN CHAINS AEROBIC BOTTLE ONLY CRITICAL RESULT CALLED TO, READ BACK BY AND VERIFIED WITH: RN S.SNED AT 1233 ON 09/30/2020 BY T.SAAD. Performed at [facility], [place] [address], [place], [place] [address]    Culture VANCOMYCIN RESISTANT ENTEROCOCCUS (A)  Final   Report Status 10/02/2020 FINAL  Final   Organism ID, Bacteria VANCOMYCIN RESISTANT ENTEROCOCCUS  Final      Susceptibility   Vancomycin resistant enterococcus - MIC*    AMPICILLIN <=2 SENSITIVE Sensitive     VANCOMYCIN >=32 RESISTANT Resistant     GENTAMICIN SYNERGY SENSITIVE Sensitive     LINEZOLID 2 SENSITIVE Sensitive     * VANCOMYCIN RESISTANT ENTEROCOCCUS  Blood Culture ID Panel (Reflexed)     Status: Abnormal   Collection Time: 09/29/20  1:07 PM  Result Value Ref Range Status   Enterococcus faecalis DETECTED (A) NOT DETECTED Final    Comment: CRITICAL RESULT CALLED TO, READ BACK BY AND VERIFIED WITH: RN S.SNED AT 1233 ON 09/30/2020 BY T.SAAD.    Enterococcus Faecium NOT DETECTED NOT DETECTED Final   Listeria monocytogenes NOT DETECTED NOT DETECTED Final   Staphylococcus species NOT DETECTED NOT DETECTED Final   Staphylococcus aureus (BCID) NOT DETECTED NOT DETECTED Final   Staphylococcus epidermidis NOT DETECTED NOT DETECTED Final   Staphylococcus lugdunensis NOT DETECTED NOT DETECTED Final   Streptococcus species NOT DETECTED NOT DETECTED Final   Streptococcus agalactiae NOT DETECTED NOT DETECTED Final   Streptococcus pneumoniae NOT DETECTED NOT DETECTED Final   Streptococcus pyogenes NOT DETECTED NOT  DETECTED Final   A.calcoaceticus-baumannii NOT DETECTED NOT DETECTED Final   Bacteroides fragilis NOT DETECTED NOT DETECTED Final   Enterobacterales NOT DETECTED NOT DETECTED Final   Enterobacter cloacae complex NOT DETECTED NOT DETECTED Final   Escherichia coli NOT DETECTED NOT DETECTED Final   Klebsiella aerogenes NOT DETECTED NOT DETECTED Final   Klebsiella  oxytoca NOT DETECTED NOT DETECTED Final   Klebsiella pneumoniae NOT DETECTED NOT DETECTED Final   Proteus species NOT DETECTED NOT DETECTED Final   Salmonella species NOT DETECTED NOT DETECTED Final   Serratia marcescens NOT DETECTED NOT DETECTED Final   Haemophilus influenzae NOT DETECTED NOT DETECTED Final   Neisseria meningitidis NOT DETECTED NOT DETECTED Final   Pseudomonas aeruginosa NOT DETECTED NOT DETECTED Final   Stenotrophomonas maltophilia NOT DETECTED NOT DETECTED Final   Candida albicans NOT DETECTED NOT DETECTED Final   Candida auris NOT DETECTED NOT DETECTED Final   Candida glabrata NOT DETECTED NOT DETECTED Final   Candida krusei NOT DETECTED NOT DETECTED Final   Candida parapsilosis NOT DETECTED NOT DETECTED Final   Candida tropicalis NOT DETECTED NOT DETECTED Final   Cryptococcus neoformans/gattii NOT DETECTED NOT DETECTED Final   Vancomycin resistance DETECTED (A) NOT DETECTED Final    Comment: CRITICAL RESULT CALLED TO, READ BACK BY AND VERIFIED WITH: RN S.SNED AT 1233 ON 09/30/2020 BY T.SAAD. Performed at [facility], [place] [address] [place], [place] [address]   Culture, blood (routine x 2)     Status: None   Collection Time: 09/29/20  1:14 PM   Specimen: BLOOD RIGHT ARM  Result Value Ref Range Status   Specimen Description BLOOD RIGHT ARM  Final   Special Requests   Final    BOTTLES DRAWN AEROBIC AND ANAEROBIC Blood Culture adequate volume   Culture   Final    NO GROWTH 5 DAYS Performed at [facility], [place] [address], [place], [place] [address]    Report Status 10/04/2020 FINAL  Final  Culture, blood (routine x 2)     Status: None   Collection Time: 10/20/20  9:57 AM   Specimen: BLOOD RIGHT HAND  Result Value Ref Range Status   Specimen Description BLOOD RIGHT HAND  Final   Special Requests   Final    BOTTLES DRAWN AEROBIC ONLY Blood Culture adequate volume   Culture   Final    NO GROWTH 5 DAYS Performed at [facility], [place]  [address] [place], [place] [address]    Report Status 10/25/2020 FINAL  Final  Culture, blood (routine x 2)     Status: None   Collection Time: 10/20/20  9:57 AM   Specimen: BLOOD RIGHT HAND  Result Value Ref Range Status   Specimen Description BLOOD RIGHT HAND  Final   Special Requests   Final    BOTTLES DRAWN AEROBIC ONLY Blood Culture adequate volume   Culture   Final    NO GROWTH 5 DAYS Performed at [facility], [place] [address], [place], [place] [address]    Report Status 10/25/2020 FINAL  Final  Culture, blood (routine x 2)     Status: None   Collection Time: 11/27/20  9:33 PM   Specimen: BLOOD RIGHT HAND  Result Value Ref Range Status   Specimen Description BLOOD RIGHT HAND  Final   Special Requests AEROBIC BOTTLE ONLY Blood Culture adequate volume  Final   Culture   Final    NO GROWTH 5 DAYS Performed at [facility], [place] [address], [place], [place] [address]    Report Status 12/02/2020 FINAL  Final  Culture, blood (routine x 2)     Status: None   Collection Time: 11/27/20  9:33 PM   Specimen: BLOOD  Result Value Ref Range Status   Specimen Description BLOOD RIGHT ANTECUBITAL  Final   Special Requests   Final    BOTTLES DRAWN AEROBIC AND ANAEROBIC Blood Culture adequate volume   Culture   Final    NO GROWTH 5 DAYS Performed at [facility], [place] [address] [place], [place] [address]    Report Status 12/02/2020 FINAL  Final  Culture, Respiratory w Gram Stain     Status: None (Preliminary result)   Collection Time: 12/02/20  3:29 PM   Specimen: Tracheal Aspirate; Respiratory  Result Value Ref Range Status   Specimen Description TRACHEAL ASPIRATE  Final   Special Requests NONE  Final   Gram Stain   Final    FEW SQUAMOUS EPITHELIAL CELLS PRESENT FEW WBC SEEN FEW GRAM POSITIVE COCCI MODERATE GRAM NEGATIVE RODS    Culture   Final    TOO YOUNG TO READ Performed at [facility], [place] [address] [place], [place] [address]    Report Status PENDING   Incomplete    Coagulation Studies: No results for input(s): LABPROT, INR in the last 72 hours.   Urinalysis: No results for input(s): COLORURINE, LABSPEC, PHURINE, GLUCOSEU, HGBUR, BILIRUBINUR, KETONESUR, PROTEINUR, UROBILINOGEN, NITRITE, LEUKOCYTESUR in the last 72 hours.  Invalid input(s): APPERANCEUR    Imaging: DG Chest Port 1 View  Result Date: 12/02/2020 CLINICAL DATA:  Elevated white blood cell count. EXAM: PORTABLE CHEST 1 VIEW COMPARISON:  11/27/2020 FINDINGS: A tracheostomy tube projects over the airway. The cardiac silhouette remains mildly enlarged. Lung volumes are low with mild pulmonary vascular congestion. There are new asymmetric interstitial and hazy airspace opacities in the left mid lung and left lung base. No large pleural effusion or pneumothorax is identified. IMPRESSION: Low lung volumes with new asymmetric left lung opacities which could reflect pneumonia. Electronically Signed   By: Logan Bores M.D.   On: 12/02/2020 16:25     Medications:       Assessment/ Plan:  45 y.o. male with a PMHx of ESRD on HD, posterior reversible encephalopathy syndrome, diabetes mellitus type 2, chronic systolic heart failure, anemia of chronic kidney disease, secondary hyperparathyroidism, malignant hypertension, acute respiratory failure, who was admitted to [facility] on 06/29/2020 for ongoing treatment of acute respiratory failure, posterior reversible encephalopathy syndrome, and end-stage renal disease.   1.  ESRD on HD.  Pt will undergo HD later today.  Has been tolerating treatments well.   2.  Anemia of chronic kidney disease.  Hemoglobin up a bit to 7.5, continue retacrit 6000 units IV with HD.   3.  Secondary hyperparathyroidism.  Phosphorus currently 2.8 and acceptable.   4.  Acute respiratory failure.  Tracheostomy placed 07/17/2020.  Patient continues to be on T-piece collar.  5.  Hypertension.  Blood pressure currently 153/88.  Continue current  antihypertensive regimen.   Trysten Berti 12/2/20228:16 AM

## 2020-12-06 ENCOUNTER — Other Ambulatory Visit (HOSPITAL_COMMUNITY): Payer: Medicare Other

## 2020-12-06 LAB — CULTURE, RESPIRATORY W GRAM STAIN

## 2020-12-06 NOTE — Progress Notes (Signed)
PROGRESS NOTE    Jeffery Andrews  CWC:[id number] DOB: April 11, 1975 DOA: 07/17/2020  Brief Narrative:  Jeffery Andrews is an 45 y.o. male with medical history significant of ESRD on hemodialysis, posterior reversible encephalopathy syndrome, diabetes mellitus type 2, chronic systolic congestive heart failure, hypertension who was initially admitted to [facility] when he presented to the emergency department on 06/22/2020 due to encephalopathy.  He was found to have malignant hypertension and worsening congestive heart failure.  He also had significant electrolyte abnormalities.  Due to his encephalopathy he was intubated.  He also had hyperglycemia without ketoacidosis.  He was treated with insulin drip and dialysis.  He was diagnosed with posterior reversible encephalopathy syndrome.  Due to his complex medical problems he was transferred and admitted to [facility].  After admission here he had respiratory cultures which showed Pseudomonas for which he received treatment with antibiotics.  As soon as antibiotics are discontinued he starts having worsening leukocytosis, fever. He received treatment with multiple rounds of antibiotics.  12/06/2020: Now having leukocytosis again.  Respiratory cultures from 12/02/2020 showing abundant Pseudomonas aeruginosa.  Assessment & Plan:  Active Problems:   Acute on chronic respiratory failure with hypoxia  Recurrent pneumonia with Pseudomonas aeruginosa Systemic inflammatory response syndrome/leukocytosis Sacrococcygeal pressure ulcer, stage IV with previous osteomyelitis   End stage renal disease on dialysis  Diabetes mellitus     Chronic diastolic CHF (congestive heart failure) Hypertension Encephalopathy Dysphagia/protein calorie malnutrition   Acute on chronic respiratory failure with hypoxemia: Multifactorial etiology.  He has had multiple episodes of pneumonia and received treatment with multiple rounds of  antibiotics.  Previous respiratory cultures from 09/29/2020 showed Pseudomonas.  He had having worsening leukocytosis along with on and off fever and received treatment with IV meropenem.  Now again having leukocytosis with respiratory cultures collected on 12/02/2020 again showing abundant Pseudomonas aeruginosa.  Started on treatment with IV cefepime.  Plan to treat for duration of 7-10 days pending improvement.  If he is worsening consider switching to ceftazidime and adding tobramycin nebulizers.  Recommend to repeat chest imaging preferably chest CT without contrast if worsening. Please monitor CBC, BUN/creatinine closely while on the antibiotics. Recommend to continue pulmonary toileting.  He unfortunately has dysphagia and also high risk for recurrent aspiration and aspiration pneumonitis despite being on antibiotics.   Pneumonia: Respiratory culture showing abundant Pseudomonas aeruginosa.  Antibiotics and plan as mentioned above. As mentioned above, due to his dysphagia and aspiration he is high risk for recurrent aspiration pneumonitis and worsening respiratory failure despite being on antibiotics.   Systemic inflammatory response syndrome/leukocytosis: He had multiple sources including pneumonia, dysphagia with high suspicion for ongoing aspiration, sacrococcygeal pressure ulcer with previous osteomyelitis.  Antibiotics and plan as mentioned above.  Also had bacteremia with VRE previously that has been treated already.  Blood cultures from 11/27/2020 did not show any growth.  Respiratory culture from 12/02/2020 showing abundant Pseudomonas aeruginosa.  Please monitor CBC, BUN/creatinine closely while on antibiotics.  Sacrococcygeal pressure ulcer, stage IV with previous osteomyelitis: He has received treatment with multiple rounds of antibiotics.  Unfortunately due to his debility, inability to offload he is high risk for further skin breakdown and worsening of the pressure ulcers.  Continue local  wound care.  In the meantime, if he is having worsening of the pressure ulcer then we may need to consider consulting surgery to evaluate for debridement.   End-stage renal disease on dialysis: Antibiotics renally dosed.  Nephrology following.  Dialysis per  nephrology.   Diabetes mellitus: Continue to monitor Accu-Cheks, medications and management of diabetes per the primary team.  He will need proper glycemic control in order to enable wound healing.   Congestive heart failure: Management per the primary team.   Encephalopathy: Likely toxic/metabolic.  Antibiotics as mentioned above.  Continue supportive management per primary team.   Hypertension: He previously had uncontrolled hypertension with hypertensive encephalopathy at the acute facility.  Continue medications and management of hypertension per the primary team.   Dysphagia/protein calorie malnutrition: Due to his dysphagia he is at risk for ongoing aspiration and recurrent aspiration pneumonitis and pneumonia with worsening respiratory failure.  Management of protein calorie malnutrition per the primary team.   Unfortunately due to his complex medical problems he is high risk for worsening and decompensation.  Overall poor prognosis.  Recommend to discuss goals of care.   Subjective: He is having worsening leukocytosis.  Respiratory cultures showing abundant Pseudomonas aeruginosa.     Objective: Vitals: Temperature 97.5, pulse 62, respiratory rate 26, blood pressure 139/71, oxygen saturation 98%  Examination: Constitutional: chronically ill-appearing male, encephalopathic Head: Atraumatic Eyes: PERLA  ENMT: external ears and nose appear normal, Lips appears normal, moist oral mucosa  Neck: Has trach in place CVS: S1-S2   Respiratory: scattered rhonchi, no wheezing Abdomen: soft nontender, nondistended, normal bowel sounds, PEG tube in place Musculoskeletal: edema Neuro: Encephalopathic, he is not following commands at this  time.  Unable to do a neurologic exam. Psych: Unable to assess at this time Skin: no rashes   Data Reviewed: I have personally reviewed labs and imaging studies  Recent Results (from the past 240 hour(s))  Culture, blood (routine x 2)     Status: None   Collection Time: 11/27/20  9:33 PM   Specimen: BLOOD RIGHT HAND  Result Value Ref Range Status   Specimen Description BLOOD RIGHT HAND  Final   Special Requests AEROBIC BOTTLE ONLY Blood Culture adequate volume  Final   Culture   Final    NO GROWTH 5 DAYS Performed at [facility], [address] [place], [place] [address]    Report Status 12/02/2020 FINAL  Final  Culture, blood (routine x 2)     Status: None   Collection Time: 11/27/20  9:33 PM   Specimen: BLOOD  Result Value Ref Range Status   Specimen Description BLOOD RIGHT ANTECUBITAL  Final   Special Requests   Final    BOTTLES DRAWN AEROBIC AND ANAEROBIC Blood Culture adequate volume   Culture   Final    NO GROWTH 5 DAYS Performed at [facility], [place] [address] [place], [place] [address]    Report Status 12/02/2020 FINAL  Final  Culture, Respiratory w Gram Stain     Status: None   Collection Time: 12/02/20  3:29 PM   Specimen: Tracheal Aspirate; Respiratory  Result Value Ref Range Status   Specimen Description TRACHEAL ASPIRATE  Final   Special Requests NONE  Final   Gram Stain   Final    FEW SQUAMOUS EPITHELIAL CELLS PRESENT FEW WBC SEEN FEW GRAM POSITIVE COCCI MODERATE GRAM NEGATIVE RODS Performed at [facility], [place] [address] [place], [place] [address]    Culture ABUNDANT PSEUDOMONAS AERUGINOSA  Final   Report Status 12/06/2020 FINAL  Final   Organism ID, Bacteria PSEUDOMONAS AERUGINOSA  Final      Susceptibility   Pseudomonas aeruginosa - MIC*    CEFTAZIDIME 2 SENSITIVE Sensitive  CIPROFLOXACIN 2 RESISTANT Resistant     GENTAMICIN >=16 RESISTANT Resistant     IMIPENEM >=16 RESISTANT Resistant     PIP/TAZO <=4 SENSITIVE  Sensitive     * ABUNDANT PSEUDOMONAS AERUGINOSA     Scheduled Meds: Please see MAR    Yaakov Guthrie, MD 12/06/20

## 2020-12-07 LAB — RENAL FUNCTION PANEL
Albumin: 1.8 g/dL — ABNORMAL LOW (ref 3.5–5.0)
Anion gap: 9 (ref 5–15)
BUN: 67 mg/dL — ABNORMAL HIGH (ref 6–20)
CO2: 29 mmol/L (ref 22–32)
Calcium: 9.2 mg/dL (ref 8.9–10.3)
Chloride: 94 mmol/L — ABNORMAL LOW (ref 98–111)
Creatinine, Ser: 2.66 mg/dL — ABNORMAL HIGH (ref 0.61–1.24)
GFR, Estimated: 29 mL/min — ABNORMAL LOW (ref >60)
Glucose, Bld: 198 mg/dL — ABNORMAL HIGH (ref 70–99)
Phosphorus: 3.2 mg/dL (ref 2.5–4.6)
Potassium: 4.1 mmol/L (ref 3.5–5.1)
Sodium: 132 mmol/L — ABNORMAL LOW (ref 135–145)

## 2020-12-07 LAB — CBC
HCT: 26 % — ABNORMAL LOW (ref 39.0–52.0)
Hemoglobin: 7.9 g/dL — ABNORMAL LOW (ref 13.0–17.0)
MCH: 24.2 pg — ABNORMAL LOW (ref 26.0–34.0)
MCHC: 30.4 g/dL (ref 30.0–36.0)
MCV: 79.5 fL — ABNORMAL LOW (ref 80.0–100.0)
Platelets: 241 10*3/uL (ref 150–400)
RBC: 3.27 MIL/uL — ABNORMAL LOW (ref 4.22–5.81)
RDW: 16 % — ABNORMAL HIGH (ref 11.5–15.5)
WBC: 14.9 10*3/uL — ABNORMAL HIGH (ref 4.0–10.5)
nRBC: 0 % (ref 0.0–0.2)

## 2020-12-07 NOTE — Progress Notes (Signed)
[facility]  ROUNDING NOTE   Subjective:  Respiratory cultures now showing Pseudomonas aeruginosa. Due for hemodialysis treatment today. Sitting up in chair at the moment. Noninteractive.  Temperature 98 pulse 81 respirations 29 blood pressure 145/83   Objective:  Vital signs in last 24 hours:  Temperature 97.4 pulse 52 respiration 35 blood pressure 153/88  Physical Exam: General:  No acute distress  Head:  Normocephalic, atraumatic.    Eyes:  Anicteric  Neck:  Tracheostomy in place  Lungs:   Scattered rhonchi, normal effort  Heart:  S1S2 no rubs  Abdomen:   Soft, nontender, bowel sounds present  Extremities:  3+ bilateral upper extremity edema, pedal edema noted  Neurologic:  Bilateral hand contractures, not following commands  Skin:  No acute skin rash  Access:  Left upper extremity AV fistula    Basic Metabolic Panel: Recent Labs  Lab 11/30/20 0948 12/02/20 1122 12/04/20 0400  NA 131* 134* 131*  K 4.8 4.0 3.8  CL 95* 95* 93*  CO2 25 29 28   GLUCOSE 113* 210* 195*  BUN 78* 66* 63*  CREATININE 2.93* 2.81* 2.67*  CALCIUM 8.4* 8.5* 8.5*  PHOS 3.3 2.8 3.0     Liver Function Tests: Recent Labs  Lab 11/30/20 0948 12/02/20 1122 12/04/20 0400  ALBUMIN 1.5* 1.8* 1.6*    No results for input(s): LIPASE, AMYLASE in the last 168 hours. No results for input(s): AMMONIA in the last 168 hours.  CBC: Recent Labs  Lab 11/30/20 2202 12/01/20 0621 12/02/20 1121 12/04/20 1357 12/07/20 0706  WBC 19.2* 16.4* 20.3* 14.3* 14.9*  HGB 7.6* 7.2* 7.5* 7.4* 7.9*  HCT 25.1* 23.7* 24.6* 24.1* 26.0*  MCV 78.9* 79.0* 79.4* 78.8* 79.5*  PLT 156 151 158 172 241     Cardiac Enzymes: No results for input(s): CKTOTAL, CKMB, CKMBINDEX, TROPONINI in the last 168 hours.  BNP: Invalid input(s): POCBNP  CBG: No results for input(s): GLUCAP in the last 168 hours.  Microbiology: Results for orders placed or performed during the hospital encounter of 07/17/20   Culture, blood (routine x 2)     Status: None   Collection Time: 07/27/20 11:17 AM   Specimen: BLOOD RIGHT ARM  Result Value Ref Range Status   Specimen Description BLOOD RIGHT ARM  Final   Special Requests   Final    BOTTLES DRAWN AEROBIC ONLY Blood Culture results may not be optimal due to an inadequate volume of blood received in culture bottles   Culture   Final    NO GROWTH 5 DAYS Performed at [facility], [place] [address], [place], [place] [address]    Report Status 08/01/2020 FINAL  Final  Culture, blood (routine x 2)     Status: None   Collection Time: 07/27/20 11:23 AM   Specimen: BLOOD RIGHT HAND  Result Value Ref Range Status   Specimen Description BLOOD RIGHT HAND  Final   Special Requests   Final    BOTTLES DRAWN AEROBIC ONLY Blood Culture results may not be optimal due to an inadequate volume of blood received in culture bottles   Culture   Final    NO GROWTH 5 DAYS Performed at [facility], [place] [address], [place],  [address]    Report Status 08/01/2020 FINAL  Final  Culture, blood (routine x 2)     Status: None   Collection Time: 08/01/20  2:25 PM   Specimen: BLOOD RIGHT HAND  Result Value Ref Range Status   Specimen  Description BLOOD RIGHT HAND  Final   Special Requests   Final    BOTTLES DRAWN AEROBIC AND ANAEROBIC Blood Culture results may not be optimal due to an inadequate volume of blood received in culture bottles   Culture   Final    NO GROWTH 5 DAYS Performed at [facility], [place] [address] [place], [place] [address]    Report Status 08/06/2020 FINAL  Final  Culture, blood (routine x 2)     Status: None   Collection Time: 08/01/20  2:39 PM   Specimen: BLOOD RIGHT ARM  Result Value Ref Range Status   Specimen Description BLOOD RIGHT ARM  Final   Special Requests   Final    BOTTLES DRAWN AEROBIC ONLY Blood Culture results may not be optimal due to an inadequate volume of blood received in culture bottles   Culture   Final     NO GROWTH 5 DAYS Performed at [facility], [place] [address], [place], [place] [address]    Report Status 08/06/2020 FINAL  Final  Culture, Respiratory w Gram Stain     Status: None   Collection Time: 08/01/20  2:55 PM   Specimen: Tracheal Aspirate; Respiratory  Result Value Ref Range Status   Specimen Description TRACHEAL ASPIRATE  Final   Special Requests NONE  Final   Gram Stain   Final    FEW WBC PRESENT, PREDOMINANTLY PMN MODERATE GRAM NEGATIVE RODS Performed at [facility], [place] [address] [place], [place] [address]    Culture ABUNDANT PSEUDOMONAS AERUGINOSA  Final   Report Status 08/04/2020 FINAL  Final   Organism ID, Bacteria PSEUDOMONAS AERUGINOSA  Final      Susceptibility   Pseudomonas aeruginosa - MIC*    CEFTAZIDIME <=1 SENSITIVE Sensitive     CIPROFLOXACIN <=0.25 SENSITIVE Sensitive     GENTAMICIN 8 INTERMEDIATE Intermediate     IMIPENEM >=16 RESISTANT Resistant     PIP/TAZO <=4 SENSITIVE Sensitive     CEFEPIME 2 SENSITIVE Sensitive     * ABUNDANT PSEUDOMONAS AERUGINOSA  Culture, Respiratory w Gram Stain     Status: None   Collection Time: 09/17/20  5:21 PM   Specimen: Tracheal Aspirate; Respiratory  Result Value Ref Range Status   Specimen Description TRACHEAL ASPIRATE  Final   Special Requests NONE  Final   Gram Stain   Final    ABUNDANT WBC PRESENT,BOTH PMN AND MONONUCLEAR MODERATE SQUAMOUS EPITHELIAL CELLS PRESENT MODERATE GRAM POSITIVE COCCI MODERATE GRAM NEGATIVE RODS Performed at [facility], [place] [address] [place], [place] [address]    Culture FEW PSEUDOMONAS AERUGINOSA  Final   Report Status 09/22/2020 FINAL  Final   Organism ID, Bacteria PSEUDOMONAS AERUGINOSA  Final      Susceptibility   Pseudomonas aeruginosa - MIC*    CEFTAZIDIME 2 SENSITIVE Sensitive     CIPROFLOXACIN 1 SENSITIVE Sensitive     GENTAMICIN >=16 RESISTANT Resistant     IMIPENEM >=16 RESISTANT Resistant     PIP/TAZO <=4 SENSITIVE Sensitive     * FEW  PSEUDOMONAS AERUGINOSA  Culture, blood (routine x 2)     Status: None   Collection Time: 09/18/20 12:05 PM   Specimen: BLOOD RIGHT ARM  Result Value Ref Range Status   Specimen Description BLOOD RIGHT ARM  Final   Special Requests   Final    BOTTLES DRAWN AEROBIC AND ANAEROBIC Blood Culture adequate volume   Culture   Final    NO GROWTH  5 DAYS Performed at [facility], [place] [address], [place], [place] [address]    Report Status 09/23/2020 FINAL  Final  Culture, blood (routine x 2)     Status: None   Collection Time: 09/18/20 12:05 PM   Specimen: BLOOD RIGHT HAND  Result Value Ref Range Status   Specimen Description BLOOD RIGHT HAND  Final   Special Requests   Final    BOTTLES DRAWN AEROBIC AND ANAEROBIC Blood Culture adequate volume   Culture   Final    NO GROWTH 5 DAYS Performed at [facility], [place] [address], [place], [place] [address]    Report Status 09/23/2020 FINAL  Final  Culture, Respiratory w Gram Stain     Status: None   Collection Time: 09/29/20 12:06 PM   Specimen: Tracheal Aspirate; Respiratory  Result Value Ref Range Status   Specimen Description TRACHEAL ASPIRATE  Final   Special Requests NONE  Final   Gram Stain   Final    RARE SQUAMOUS EPITHELIAL CELLS PRESENT ABUNDANT WBC PRESENT,BOTH PMN AND MONONUCLEAR ABUNDANT GRAM NEGATIVE RODS    Culture   Final    FEW PSEUDOMONAS AERUGINOSA MULTI-DRUG RESISTANT ORGANISM CRITICAL RESULT CALLED TO, READ BACK BY AND VERIFIED WITH: [place] RN @1145  10/03/20 EB Performed at [facility], [place] [address] [place], [place] [address]    Report Status 10/03/2020 FINAL  Final   Organism ID, Bacteria PSEUDOMONAS AERUGINOSA  Final      Susceptibility   Pseudomonas aeruginosa - MIC*    CEFTAZIDIME 16 INTERMEDIATE Intermediate     CIPROFLOXACIN 0.5 SENSITIVE Sensitive     GENTAMICIN >=16 RESISTANT Resistant     IMIPENEM >=16 RESISTANT Resistant     * FEW PSEUDOMONAS AERUGINOSA  Culture, blood (routine x  2)     Status: Abnormal   Collection Time: 09/29/20  1:07 PM   Specimen: BLOOD RIGHT ARM  Result Value Ref Range Status   Specimen Description BLOOD RIGHT ARM  Final   Special Requests   Final    BOTTLES DRAWN AEROBIC AND ANAEROBIC Blood Culture adequate volume   Culture  Setup Time   Final    GRAM POSITIVE COCCI IN CHAINS AEROBIC BOTTLE ONLY CRITICAL RESULT CALLED TO, READ BACK BY AND VERIFIED WITH: RN S.SNED AT 1233 ON 09/30/2020 BY T.SAAD. Performed at [facility], [place] [address], [place], [place] [address]    Culture VANCOMYCIN RESISTANT ENTEROCOCCUS (A)  Final   Report Status 10/02/2020 FINAL  Final   Organism ID, Bacteria VANCOMYCIN RESISTANT ENTEROCOCCUS  Final      Susceptibility   Vancomycin resistant enterococcus - MIC*    AMPICILLIN <=2 SENSITIVE Sensitive     VANCOMYCIN >=32 RESISTANT Resistant     GENTAMICIN SYNERGY SENSITIVE Sensitive     LINEZOLID 2 SENSITIVE Sensitive     * VANCOMYCIN RESISTANT ENTEROCOCCUS  Blood Culture ID Panel (Reflexed)     Status: Abnormal   Collection Time: 09/29/20  1:07 PM  Result Value Ref Range Status   Enterococcus faecalis DETECTED (A) NOT DETECTED Final    Comment: CRITICAL RESULT CALLED TO, READ BACK BY AND VERIFIED WITH: RN S.SNED AT 1233 ON 09/30/2020 BY T.SAAD.    Enterococcus Faecium NOT DETECTED NOT DETECTED Final   Listeria monocytogenes NOT DETECTED NOT DETECTED Final   Staphylococcus species NOT DETECTED NOT DETECTED Final   Staphylococcus aureus (BCID) NOT DETECTED NOT DETECTED Final   Staphylococcus epidermidis NOT DETECTED NOT DETECTED Final  Staphylococcus lugdunensis NOT DETECTED NOT DETECTED Final   Streptococcus species NOT DETECTED NOT DETECTED Final   Streptococcus agalactiae NOT DETECTED NOT DETECTED Final   Streptococcus pneumoniae NOT DETECTED NOT DETECTED Final   Streptococcus pyogenes NOT DETECTED NOT DETECTED Final   A.calcoaceticus-baumannii NOT DETECTED NOT DETECTED Final   Bacteroides fragilis  NOT DETECTED NOT DETECTED Final   Enterobacterales NOT DETECTED NOT DETECTED Final   Enterobacter cloacae complex NOT DETECTED NOT DETECTED Final   Escherichia coli NOT DETECTED NOT DETECTED Final   Klebsiella aerogenes NOT DETECTED NOT DETECTED Final   Klebsiella oxytoca NOT DETECTED NOT DETECTED Final   Klebsiella pneumoniae NOT DETECTED NOT DETECTED Final   Proteus species NOT DETECTED NOT DETECTED Final   Salmonella species NOT DETECTED NOT DETECTED Final   Serratia marcescens NOT DETECTED NOT DETECTED Final   Haemophilus influenzae NOT DETECTED NOT DETECTED Final   Neisseria meningitidis NOT DETECTED NOT DETECTED Final   Pseudomonas aeruginosa NOT DETECTED NOT DETECTED Final   Stenotrophomonas maltophilia NOT DETECTED NOT DETECTED Final   Candida albicans NOT DETECTED NOT DETECTED Final   Candida auris NOT DETECTED NOT DETECTED Final   Candida glabrata NOT DETECTED NOT DETECTED Final   Candida krusei NOT DETECTED NOT DETECTED Final   Candida parapsilosis NOT DETECTED NOT DETECTED Final   Candida tropicalis NOT DETECTED NOT DETECTED Final   Cryptococcus neoformans/gattii NOT DETECTED NOT DETECTED Final   Vancomycin resistance DETECTED (A) NOT DETECTED Final    Comment: CRITICAL RESULT CALLED TO, READ BACK BY AND VERIFIED WITH: RN S.SNED AT 1233 ON 09/30/2020 BY T.SAAD. Performed at [facility], [place] [address] [place], [place] [address]   Culture, blood (routine x 2)     Status: None   Collection Time: 09/29/20  1:14 PM   Specimen: BLOOD RIGHT ARM  Result Value Ref Range Status   Specimen Description BLOOD RIGHT ARM  Final   Special Requests   Final    BOTTLES DRAWN AEROBIC AND ANAEROBIC Blood Culture adequate volume   Culture   Final    NO GROWTH 5 DAYS Performed at [facility], [place] [address] [place], [place] [address]    Report Status 10/04/2020 FINAL  Final  Culture, blood (routine x 2)     Status: None   Collection Time: 10/20/20  9:57 AM   Specimen:  BLOOD RIGHT HAND  Result Value Ref Range Status   Specimen Description BLOOD RIGHT HAND  Final   Special Requests   Final    BOTTLES DRAWN AEROBIC ONLY Blood Culture adequate volume   Culture   Final    NO GROWTH 5 DAYS Performed at [facility], [place] [address] [place], [place] [address]    Report Status 10/25/2020 FINAL  Final  Culture, blood (routine x 2)     Status: None   Collection Time: 10/20/20  9:57 AM   Specimen: BLOOD RIGHT HAND  Result Value Ref Range Status   Specimen Description BLOOD RIGHT HAND  Final   Special Requests   Final    BOTTLES DRAWN AEROBIC ONLY Blood Culture adequate volume   Culture   Final    NO GROWTH 5 DAYS Performed at [facility], [place] [address], [place], [place] [address]    Report Status 10/25/2020 FINAL  Final  Culture, blood (routine x 2)     Status: None   Collection Time: 11/27/20  9:33 PM   Specimen: BLOOD RIGHT HAND  Result Value Ref  Range Status   Specimen Description BLOOD RIGHT HAND  Final   Special Requests AEROBIC BOTTLE ONLY Blood Culture adequate volume  Final   Culture   Final    NO GROWTH 5 DAYS Performed at [facility], [address] [place], [place] [address]    Report Status 12/02/2020 FINAL  Final  Culture, blood (routine x 2)     Status: None   Collection Time: 11/27/20  9:33 PM   Specimen: BLOOD  Result Value Ref Range Status   Specimen Description BLOOD RIGHT ANTECUBITAL  Final   Special Requests   Final    BOTTLES DRAWN AEROBIC AND ANAEROBIC Blood Culture adequate volume   Culture   Final    NO GROWTH 5 DAYS Performed at [facility], [place] [address], [place], [place] [address]    Report Status 12/02/2020 FINAL  Final  Culture, Respiratory w Gram Stain     Status: None   Collection Time: 12/02/20  3:29 PM   Specimen: Tracheal Aspirate; Respiratory  Result Value Ref Range Status   Specimen Description TRACHEAL ASPIRATE  Final   Special Requests NONE  Final   Gram Stain   Final     FEW SQUAMOUS EPITHELIAL CELLS PRESENT FEW WBC SEEN FEW GRAM POSITIVE COCCI MODERATE GRAM NEGATIVE RODS Performed at [facility], [place] [address] [place], [place] [address]    Culture ABUNDANT PSEUDOMONAS AERUGINOSA  Final   Report Status 12/06/2020 FINAL  Final   Organism ID, Bacteria PSEUDOMONAS AERUGINOSA  Final      Susceptibility   Pseudomonas aeruginosa - MIC*    CEFTAZIDIME 2 SENSITIVE Sensitive     CIPROFLOXACIN 2 RESISTANT Resistant     GENTAMICIN >=16 RESISTANT Resistant     IMIPENEM >=16 RESISTANT Resistant     PIP/TAZO <=4 SENSITIVE Sensitive     * ABUNDANT PSEUDOMONAS AERUGINOSA    Coagulation Studies: No results for input(s): LABPROT, INR in the last 72 hours.   Urinalysis: No results for input(s): COLORURINE, LABSPEC, PHURINE, GLUCOSEU, HGBUR, BILIRUBINUR, KETONESUR, PROTEINUR, UROBILINOGEN, NITRITE, LEUKOCYTESUR in the last 72 hours.  Invalid input(s): APPERANCEUR    Imaging: DG Chest Port 1 View  Result Date: 12/06/2020 CLINICAL DATA:  Respiratory failure EXAM: PORTABLE CHEST 1 VIEW COMPARISON:  Chest x-ray dated 12/02/2020 and chest x-ray dated 11/27/2020. FINDINGS: Central pulmonary vascular congestion persists. Slightly increased interstitial prominence bilaterally. No confluent opacity to suggest a consolidating pneumonia. No pleural effusion or pneumothorax is seen. Stable cardiomegaly. Tracheostomy tube appears adequately positioned in the midline. IMPRESSION: 1. Mild CHF/volume overload, perhaps slightly worsened compared to chest x-ray of 12/02/2020. 2. No evidence of consolidating pneumonia or alveolar pulmonary edema Electronically Signed   By: Franki Cabot M.D.   On: 12/06/2020 15:34     Medications:       Assessment/ Plan:  45 y.o. male with a PMHx of ESRD on HD, posterior reversible encephalopathy syndrome, diabetes mellitus type 2, chronic systolic heart failure, anemia of chronic kidney disease, secondary hyperparathyroidism, malignant  hypertension, acute respiratory failure, who was admitted to [facility] on 06/29/2020 for ongoing treatment of acute respiratory failure, posterior reversible encephalopathy syndrome, and end-stage renal disease.   1.  ESRD on HD.  Continue dialysis on MWF schedule.  Due for dialysis treatment today.  2.  Anemia of chronic kidney disease.  Hemoglobin up slightly to 7.9.  Continue Retacrit 6000 units IV with dialysis treatments.  3.  Secondary hyperparathyroidism.  Serum phosphorus was 3.0 at  last check.  We will continue to monitor serum phosphorus.  4.  Acute respiratory failure.  Tracheostomy placed 07/17/2020.  Patient continues to be on T-piece collar.  Pseudomonas now growing in respiratory culture.  5.  Hypertension.  Blood pressure currently 145/83 and showing better control.  Maintain patient on current antihypertensive regimen.   Jeffery [place] 12/5/20228:02 AM

## 2020-12-09 LAB — CBC
HCT: 22 % — ABNORMAL LOW (ref 39.0–52.0)
Hemoglobin: 6.6 g/dL — CL (ref 13.0–17.0)
MCH: 23.9 pg — ABNORMAL LOW (ref 26.0–34.0)
MCHC: 30 g/dL (ref 30.0–36.0)
MCV: 79.7 fL — ABNORMAL LOW (ref 80.0–100.0)
Platelets: 198 10*3/uL (ref 150–400)
RBC: 2.76 MIL/uL — ABNORMAL LOW (ref 4.22–5.81)
RDW: 16.1 % — ABNORMAL HIGH (ref 11.5–15.5)
WBC: 10.8 10*3/uL — ABNORMAL HIGH (ref 4.0–10.5)
nRBC: 0 % (ref 0.0–0.2)

## 2020-12-09 LAB — RENAL FUNCTION PANEL
Albumin: 1.7 g/dL — ABNORMAL LOW (ref 3.5–5.0)
Anion gap: 7 (ref 5–15)
BUN: 61 mg/dL — ABNORMAL HIGH (ref 6–20)
CO2: 29 mmol/L (ref 22–32)
Calcium: 8.5 mg/dL — ABNORMAL LOW (ref 8.9–10.3)
Chloride: 97 mmol/L — ABNORMAL LOW (ref 98–111)
Creatinine, Ser: 2.49 mg/dL — ABNORMAL HIGH (ref 0.61–1.24)
GFR, Estimated: 31 mL/min — ABNORMAL LOW (ref >60)
Glucose, Bld: 117 mg/dL — ABNORMAL HIGH (ref 70–99)
Phosphorus: 2.4 mg/dL — ABNORMAL LOW (ref 2.5–4.6)
Potassium: 4.2 mmol/L (ref 3.5–5.1)
Sodium: 133 mmol/L — ABNORMAL LOW (ref 135–145)

## 2020-12-09 LAB — PREPARE RBC (CROSSMATCH)

## 2020-12-09 NOTE — Progress Notes (Signed)
[facility]  ROUNDING NOTE   Subjective:  Patient sitting up in chair. Eyes open but not conversant. No significant neurologic change. Due for dialysis today.  Temperature 98.8 pulse 60 respirations 24 blood pressure 177/85   Objective:  Vital signs in last 24 hours:  Temperature 97.4 pulse 52 respiration 35 blood pressure 153/88  Physical Exam: General:  No acute distress  Head:  Normocephalic, atraumatic.    Eyes:  Anicteric  Neck:  Tracheostomy in place  Lungs:   Scattered rhonchi, normal effort  Heart:  S1S2 no rubs  Abdomen:   Soft, nontender, bowel sounds present  Extremities:  3+ bilateral upper extremity edema, pedal edema noted  Neurologic:  Bilateral hand contractures, not following commands  Skin:  No acute skin rash  Access:  Left upper extremity AV fistula    Basic Metabolic Panel: Recent Labs  Lab 12/02/20 1122 12/04/20 0400 12/07/20 0706 12/09/20 0940  NA 134* 131* 132* 133*  K 4.0 3.8 4.1 4.2  CL 95* 93* 94* 97*  CO2 29 28 29 29   GLUCOSE 210* 195* 198* 117*  BUN 66* 63* 67* 61*  CREATININE 2.81* 2.67* 2.66* 2.49*  CALCIUM 8.5* 8.5* 9.2 8.5*  PHOS 2.8 3.0 3.2 2.4*     Liver Function Tests: Recent Labs  Lab 12/02/20 1122 12/04/20 0400 12/07/20 0706 12/09/20 0940  ALBUMIN 1.8* 1.6* 1.8* 1.7*    No results for input(s): LIPASE, AMYLASE in the last 168 hours. No results for input(s): AMMONIA in the last 168 hours.  CBC: Recent Labs  Lab 12/02/20 1121 12/04/20 1357 12/07/20 0706  WBC 20.3* 14.3* 14.9*  HGB 7.5* 7.4* 7.9*  HCT 24.6* 24.1* 26.0*  MCV 79.4* 78.8* 79.5*  PLT 158 172 241     Cardiac Enzymes: No results for input(s): CKTOTAL, CKMB, CKMBINDEX, TROPONINI in the last 168 hours.  BNP: Invalid input(s): POCBNP  CBG: No results for input(s): GLUCAP in the last 168 hours.  Microbiology: Results for orders placed or performed during the hospital encounter of 07/17/20  Culture, blood (routine x 2)      Status: None   Collection Time: 07/27/20 11:17 AM   Specimen: BLOOD RIGHT ARM  Result Value Ref Range Status   Specimen Description BLOOD RIGHT ARM  Final   Special Requests   Final    BOTTLES DRAWN AEROBIC ONLY Blood Culture results may not be optimal due to an inadequate volume of blood received in culture bottles   Culture   Final    NO GROWTH 5 DAYS Performed at [facility], [place] [address] [place], [place] [address]    Report Status 08/01/2020 FINAL  Final  Culture, blood (routine x 2)     Status: None   Collection Time: 07/27/20 11:23 AM   Specimen: BLOOD RIGHT HAND  Result Value Ref Range Status   Specimen Description BLOOD RIGHT HAND  Final   Special Requests   Final    BOTTLES DRAWN AEROBIC ONLY Blood Culture results may not be optimal due to an inadequate volume of blood received in culture bottles   Culture   Final    NO GROWTH 5 DAYS Performed at [facility], [place] [address] [place], [place] [address]    Report Status 08/01/2020 FINAL  Final  Culture, blood (routine x 2)     Status: None   Collection Time: 08/01/20  2:25 PM   Specimen: BLOOD RIGHT HAND  Result Value Ref Range Status   Specimen Description  BLOOD RIGHT HAND  Final   Special Requests   Final    BOTTLES DRAWN AEROBIC AND ANAEROBIC Blood Culture results may not be optimal due to an inadequate volume of blood received in culture bottles   Culture   Final    NO GROWTH 5 DAYS Performed at [facility], [place] [address] [place], [place] [address]    Report Status 08/06/2020 FINAL  Final  Culture, blood (routine x 2)     Status: None   Collection Time: 08/01/20  2:39 PM   Specimen: BLOOD RIGHT ARM  Result Value Ref Range Status   Specimen Description BLOOD RIGHT ARM  Final   Special Requests   Final    BOTTLES DRAWN AEROBIC ONLY Blood Culture results may not be optimal due to an inadequate volume of blood received in culture bottles   Culture   Final    NO GROWTH 5 DAYS Performed at  [facility], [place] [address] [place], [place] [address]    Report Status 08/06/2020 FINAL  Final  Culture, Respiratory w Gram Stain     Status: None   Collection Time: 08/01/20  2:55 PM   Specimen: Tracheal Aspirate; Respiratory  Result Value Ref Range Status   Specimen Description TRACHEAL ASPIRATE  Final   Special Requests NONE  Final   Gram Stain   Final    FEW WBC PRESENT, PREDOMINANTLY PMN MODERATE GRAM NEGATIVE RODS Performed at [facility], [place] [address] [place], [place] [address]    Culture ABUNDANT PSEUDOMONAS AERUGINOSA  Final   Report Status 08/04/2020 FINAL  Final   Organism ID, Bacteria PSEUDOMONAS AERUGINOSA  Final      Susceptibility   Pseudomonas aeruginosa - MIC*    CEFTAZIDIME <=1 SENSITIVE Sensitive     CIPROFLOXACIN <=0.25 SENSITIVE Sensitive     GENTAMICIN 8 INTERMEDIATE Intermediate     IMIPENEM >=16 RESISTANT Resistant     PIP/TAZO <=4 SENSITIVE Sensitive     CEFEPIME 2 SENSITIVE Sensitive     * ABUNDANT PSEUDOMONAS AERUGINOSA  Culture, Respiratory w Gram Stain     Status: None   Collection Time: 09/17/20  5:21 PM   Specimen: Tracheal Aspirate; Respiratory  Result Value Ref Range Status   Specimen Description TRACHEAL ASPIRATE  Final   Special Requests NONE  Final   Gram Stain   Final    ABUNDANT WBC PRESENT,BOTH PMN AND MONONUCLEAR MODERATE SQUAMOUS EPITHELIAL CELLS PRESENT MODERATE GRAM POSITIVE COCCI MODERATE GRAM NEGATIVE RODS Performed at [facility], [place] [address], [place],  [address]    Culture FEW PSEUDOMONAS AERUGINOSA  Final   Report Status 09/22/2020 FINAL  Final   Organism ID, Bacteria PSEUDOMONAS AERUGINOSA  Final      Susceptibility   Pseudomonas aeruginosa - MIC*    CEFTAZIDIME 2 SENSITIVE Sensitive     CIPROFLOXACIN 1 SENSITIVE Sensitive     GENTAMICIN >=16 RESISTANT Resistant     IMIPENEM >=16 RESISTANT Resistant     PIP/TAZO <=4 SENSITIVE Sensitive     * FEW PSEUDOMONAS AERUGINOSA  Culture, blood  (routine x 2)     Status: None   Collection Time: 09/18/20 12:05 PM   Specimen: BLOOD RIGHT ARM  Result Value Ref Range Status   Specimen Description BLOOD RIGHT ARM  Final   Special Requests   Final    BOTTLES DRAWN AEROBIC AND ANAEROBIC Blood Culture adequate volume   Culture   Final    NO GROWTH 5  DAYS Performed at [facility], [place] [address], [place], [place] [address]    Report Status 09/23/2020 FINAL  Final  Culture, blood (routine x 2)     Status: None   Collection Time: 09/18/20 12:05 PM   Specimen: BLOOD RIGHT HAND  Result Value Ref Range Status   Specimen Description BLOOD RIGHT HAND  Final   Special Requests   Final    BOTTLES DRAWN AEROBIC AND ANAEROBIC Blood Culture adequate volume   Culture   Final    NO GROWTH 5 DAYS Performed at [facility], [place] [address] [place], [place] [address]    Report Status 09/23/2020 FINAL  Final  Culture, Respiratory w Gram Stain     Status: None   Collection Time: 09/29/20 12:06 PM   Specimen: Tracheal Aspirate; Respiratory  Result Value Ref Range Status   Specimen Description TRACHEAL ASPIRATE  Final   Special Requests NONE  Final   Gram Stain   Final    RARE SQUAMOUS EPITHELIAL CELLS PRESENT ABUNDANT WBC PRESENT,BOTH PMN AND MONONUCLEAR ABUNDANT GRAM NEGATIVE RODS    Culture   Final    FEW PSEUDOMONAS AERUGINOSA MULTI-DRUG RESISTANT ORGANISM CRITICAL RESULT CALLED TO, READ BACK BY AND VERIFIED WITH: [place] RN @1145  10/03/20 EB Performed at [facility], [place] [address] [place], [place] [address]    Report Status 10/03/2020 FINAL  Final   Organism ID, Bacteria PSEUDOMONAS AERUGINOSA  Final      Susceptibility   Pseudomonas aeruginosa - MIC*    CEFTAZIDIME 16 INTERMEDIATE Intermediate     CIPROFLOXACIN 0.5 SENSITIVE Sensitive     GENTAMICIN >=16 RESISTANT Resistant     IMIPENEM >=16 RESISTANT Resistant     * FEW PSEUDOMONAS AERUGINOSA  Culture, blood (routine x 2)     Status: Abnormal   Collection  Time: 09/29/20  1:07 PM   Specimen: BLOOD RIGHT ARM  Result Value Ref Range Status   Specimen Description BLOOD RIGHT ARM  Final   Special Requests   Final    BOTTLES DRAWN AEROBIC AND ANAEROBIC Blood Culture adequate volume   Culture  Setup Time   Final    GRAM POSITIVE COCCI IN CHAINS AEROBIC BOTTLE ONLY CRITICAL RESULT CALLED TO, READ BACK BY AND VERIFIED WITH: RN S.SNED AT 1233 ON 09/30/2020 BY T.SAAD. Performed at [facility], [place] [address] [place], [place] [address]    Culture VANCOMYCIN RESISTANT ENTEROCOCCUS (A)  Final   Report Status 10/02/2020 FINAL  Final   Organism ID, Bacteria VANCOMYCIN RESISTANT ENTEROCOCCUS  Final      Susceptibility   Vancomycin resistant enterococcus - MIC*    AMPICILLIN <=2 SENSITIVE Sensitive     VANCOMYCIN >=32 RESISTANT Resistant     GENTAMICIN SYNERGY SENSITIVE Sensitive     LINEZOLID 2 SENSITIVE Sensitive     * VANCOMYCIN RESISTANT ENTEROCOCCUS  Blood Culture ID Panel (Reflexed)     Status: Abnormal   Collection Time: 09/29/20  1:07 PM  Result Value Ref Range Status   Enterococcus faecalis DETECTED (A) NOT DETECTED Final    Comment: CRITICAL RESULT CALLED TO, READ BACK BY AND VERIFIED WITH: RN S.SNED AT 1233 ON 09/30/2020 BY T.SAAD.    Enterococcus Faecium NOT DETECTED NOT DETECTED Final   Listeria monocytogenes NOT DETECTED NOT DETECTED Final   Staphylococcus species NOT DETECTED NOT DETECTED Final   Staphylococcus aureus (BCID) NOT DETECTED NOT DETECTED Final   Staphylococcus epidermidis NOT DETECTED NOT DETECTED Final   Staphylococcus  lugdunensis NOT DETECTED NOT DETECTED Final   Streptococcus species NOT DETECTED NOT DETECTED Final   Streptococcus agalactiae NOT DETECTED NOT DETECTED Final   Streptococcus pneumoniae NOT DETECTED NOT DETECTED Final   Streptococcus pyogenes NOT DETECTED NOT DETECTED Final   A.calcoaceticus-baumannii NOT DETECTED NOT DETECTED Final   Bacteroides fragilis NOT DETECTED NOT DETECTED Final    Enterobacterales NOT DETECTED NOT DETECTED Final   Enterobacter cloacae complex NOT DETECTED NOT DETECTED Final   Escherichia coli NOT DETECTED NOT DETECTED Final   Klebsiella aerogenes NOT DETECTED NOT DETECTED Final   Klebsiella oxytoca NOT DETECTED NOT DETECTED Final   Klebsiella pneumoniae NOT DETECTED NOT DETECTED Final   Proteus species NOT DETECTED NOT DETECTED Final   Salmonella species NOT DETECTED NOT DETECTED Final   Serratia marcescens NOT DETECTED NOT DETECTED Final   Haemophilus influenzae NOT DETECTED NOT DETECTED Final   Neisseria meningitidis NOT DETECTED NOT DETECTED Final   Pseudomonas aeruginosa NOT DETECTED NOT DETECTED Final   Stenotrophomonas maltophilia NOT DETECTED NOT DETECTED Final   Candida albicans NOT DETECTED NOT DETECTED Final   Candida auris NOT DETECTED NOT DETECTED Final   Candida glabrata NOT DETECTED NOT DETECTED Final   Candida krusei NOT DETECTED NOT DETECTED Final   Candida parapsilosis NOT DETECTED NOT DETECTED Final   Candida tropicalis NOT DETECTED NOT DETECTED Final   Cryptococcus neoformans/gattii NOT DETECTED NOT DETECTED Final   Vancomycin resistance DETECTED (A) NOT DETECTED Final    Comment: CRITICAL RESULT CALLED TO, READ BACK BY AND VERIFIED WITH: RN S.SNED AT 1233 ON 09/30/2020 BY T.SAAD. Performed at [facility], [place] [address], [place], [place] [address]   Culture, blood (routine x 2)     Status: None   Collection Time: 09/29/20  1:14 PM   Specimen: BLOOD RIGHT ARM  Result Value Ref Range Status   Specimen Description BLOOD RIGHT ARM  Final   Special Requests   Final    BOTTLES DRAWN AEROBIC AND ANAEROBIC Blood Culture adequate volume   Culture   Final    NO GROWTH 5 DAYS Performed at [facility], [place] [address], [place], [place] [address]    Report Status 10/04/2020 FINAL  Final  Culture, blood (routine x 2)     Status: None   Collection Time: 10/20/20  9:57 AM   Specimen: BLOOD RIGHT HAND  Result Value Ref  Range Status   Specimen Description BLOOD RIGHT HAND  Final   Special Requests   Final    BOTTLES DRAWN AEROBIC ONLY Blood Culture adequate volume   Culture   Final    NO GROWTH 5 DAYS Performed at [facility], [place] [address], [place], [place] [address]    Report Status 10/25/2020 FINAL  Final  Culture, blood (routine x 2)     Status: None   Collection Time: 10/20/20  9:57 AM   Specimen: BLOOD RIGHT HAND  Result Value Ref Range Status   Specimen Description BLOOD RIGHT HAND  Final   Special Requests   Final    BOTTLES DRAWN AEROBIC ONLY Blood Culture adequate volume   Culture   Final    NO GROWTH 5 DAYS Performed at [facility], [place] [address], [place], [place] [address]    Report Status 10/25/2020 FINAL  Final  Culture, blood (routine x 2)     Status: None   Collection Time: 11/27/20  9:33 PM   Specimen: BLOOD RIGHT HAND  Result Value Ref Range  Status   Specimen Description BLOOD RIGHT HAND  Final   Special Requests AEROBIC BOTTLE ONLY Blood Culture adequate volume  Final   Culture   Final    NO GROWTH 5 DAYS Performed at [facility], [address], [place], [place] [address]    Report Status 12/02/2020 FINAL  Final  Culture, blood (routine x 2)     Status: None   Collection Time: 11/27/20  9:33 PM   Specimen: BLOOD  Result Value Ref Range Status   Specimen Description BLOOD RIGHT ANTECUBITAL  Final   Special Requests   Final    BOTTLES DRAWN AEROBIC AND ANAEROBIC Blood Culture adequate volume   Culture   Final    NO GROWTH 5 DAYS Performed at [facility], [place] [address], [place], [place] [address]    Report Status 12/02/2020 FINAL  Final  Culture, Respiratory w Gram Stain     Status: None   Collection Time: 12/02/20  3:29 PM   Specimen: Tracheal Aspirate; Respiratory  Result Value Ref Range Status   Specimen Description TRACHEAL ASPIRATE  Final   Special Requests NONE  Final   Gram Stain   Final    FEW SQUAMOUS EPITHELIAL CELLS  PRESENT FEW WBC SEEN FEW GRAM POSITIVE COCCI MODERATE GRAM NEGATIVE RODS Performed at [facility], [place] [address] [place], [place] [address]    Culture ABUNDANT PSEUDOMONAS AERUGINOSA  Final   Report Status 12/06/2020 FINAL  Final   Organism ID, Bacteria PSEUDOMONAS AERUGINOSA  Final      Susceptibility   Pseudomonas aeruginosa - MIC*    CEFTAZIDIME 2 SENSITIVE Sensitive     CIPROFLOXACIN 2 RESISTANT Resistant     GENTAMICIN >=16 RESISTANT Resistant     IMIPENEM >=16 RESISTANT Resistant     PIP/TAZO <=4 SENSITIVE Sensitive     * ABUNDANT PSEUDOMONAS AERUGINOSA    Coagulation Studies: No results for input(s): LABPROT, INR in the last 72 hours.   Urinalysis: No results for input(s): COLORURINE, LABSPEC, PHURINE, GLUCOSEU, HGBUR, BILIRUBINUR, KETONESUR, PROTEINUR, UROBILINOGEN, NITRITE, LEUKOCYTESUR in the last 72 hours.  Invalid input(s): APPERANCEUR    Imaging: No results found.   Medications:       Assessment/ Plan:  45 y.o. male with a PMHx of ESRD on HD, posterior reversible encephalopathy syndrome, diabetes mellitus type 2, chronic systolic heart failure, anemia of chronic kidney disease, secondary hyperparathyroidism, malignant hypertension, acute respiratory failure, who was admitted to [facility] on 06/29/2020 for ongoing treatment of acute respiratory failure, posterior reversible encephalopathy syndrome, and end-stage renal disease.   1.  ESRD on HD.  Patient due for hemodialysis treatment today per usual schedule.  2.  Anemia of chronic kidney disease.  Continue to monitor CBC.  Maintain the patient on Retacrit 6070 with dialysis.  3.  Secondary hyperparathyroidism.  Phosphorus currently 2.4.  We will continue to monitor.  4.  Acute respiratory failure.  Tracheostomy placed 07/17/2020.  Patient continues to be on T-piece collar.  Pseudomonas now growing in respiratory culture.  5.  Hypertension.  Blood pressure currently 177/85.  Has been  fluctuating.  Continue current antihypertensive regimen.   Saman Giddens 12/7/202211:19 AM

## 2020-12-10 LAB — HEMOGLOBIN AND HEMATOCRIT, BLOOD
HCT: 25.5 % — ABNORMAL LOW (ref 39.0–52.0)
Hemoglobin: 7.6 g/dL — ABNORMAL LOW (ref 13.0–17.0)

## 2020-12-11 ENCOUNTER — Other Ambulatory Visit (HOSPITAL_COMMUNITY): Payer: Medicare Other

## 2020-12-11 LAB — TYPE AND SCREEN
ABO/RH(D): O POS
Antibody Screen: NEGATIVE
Unit division: 0

## 2020-12-11 LAB — BPAM RBC
Blood Product Expiration Date: 202301022359
ISSUE DATE / TIME: 202212080009
Unit Type and Rh: 5100

## 2020-12-11 NOTE — Progress Notes (Signed)
[facility]  ROUNDING NOTE   Subjective:  Patient resting in bed this AM. Due for dialysis later today   Objective:  Vital signs in last 24 hours:  Temperature 96.8 pulse 49 respirations 39 blood pressure 205/103  Physical Exam: General:  No acute distress  Head:  Normocephalic, atraumatic.    Eyes:  Anicteric  Neck:  Tracheostomy in place  Lungs:   Scattered rhonchi, normal effort  Heart:  S1S2 no rubs  Abdomen:   Soft, nontender, bowel sounds present  Extremities:  3+ bilateral upper extremity edema, pedal edema noted  Neurologic:  Bilateral hand contractures, not following commands  Skin:  No acute skin rash  Access:  Left upper extremity AV fistula    Basic Metabolic Panel: Recent Labs  Lab 12/07/20 0706 12/09/20 0940  NA 132* 133*  K 4.1 4.2  CL 94* 97*  CO2 29 29  GLUCOSE 198* 117*  BUN 67* 61*  CREATININE 2.66* 2.49*  CALCIUM 9.2 8.5*  PHOS 3.2 2.4*     Liver Function Tests: Recent Labs  Lab 12/07/20 0706 12/09/20 0940  ALBUMIN 1.8* 1.7*    No results for input(s): LIPASE, AMYLASE in the last 168 hours. No results for input(s): AMMONIA in the last 168 hours.  CBC: Recent Labs  Lab 12/04/20 1357 12/07/20 0706 12/09/20 0950 12/10/20 0411  WBC 14.3* 14.9* 10.8*  --   HGB 7.4* 7.9* 6.6* 7.6*  HCT 24.1* 26.0* 22.0* 25.5*  MCV 78.8* 79.5* 79.7*  --   PLT 172 241 198  --      Cardiac Enzymes: No results for input(s): CKTOTAL, CKMB, CKMBINDEX, TROPONINI in the last 168 hours.  BNP: Invalid input(s): POCBNP  CBG: No results for input(s): GLUCAP in the last 168 hours.  Microbiology: Results for orders placed or performed during the hospital encounter of 07/17/20  Culture, blood (routine x 2)     Status: None   Collection Time: 07/27/20 11:17 AM   Specimen: BLOOD RIGHT ARM  Result Value Ref Range Status   Specimen Description BLOOD RIGHT ARM  Final   Special Requests   Final    BOTTLES DRAWN AEROBIC ONLY Blood Culture  results may not be optimal due to an inadequate volume of blood received in culture bottles   Culture   Final    NO GROWTH 5 DAYS Performed at [facility], [place] [address] [place], [place] [address]    Report Status 08/01/2020 FINAL  Final  Culture, blood (routine x 2)     Status: None   Collection Time: 07/27/20 11:23 AM   Specimen: BLOOD RIGHT HAND  Result Value Ref Range Status   Specimen Description BLOOD RIGHT HAND  Final   Special Requests   Final    BOTTLES DRAWN AEROBIC ONLY Blood Culture results may not be optimal due to an inadequate volume of blood received in culture bottles   Culture   Final    NO GROWTH 5 DAYS Performed at [facility], [place] [address] [place], [place] [address]    Report Status 08/01/2020 FINAL  Final  Culture, blood (routine x 2)     Status: None   Collection Time: 08/01/20  2:25 PM   Specimen: BLOOD RIGHT HAND  Result Value Ref Range Status   Specimen Description BLOOD RIGHT HAND  Final   Special Requests   Final    BOTTLES DRAWN AEROBIC AND ANAEROBIC Blood Culture results may not be optimal due to an inadequate volume  of blood received in culture bottles   Culture   Final    NO GROWTH 5 DAYS Performed at [facility], [place] [address], [place], [place] [address]    Report Status 08/06/2020 FINAL  Final  Culture, blood (routine x 2)     Status: None   Collection Time: 08/01/20  2:39 PM   Specimen: BLOOD RIGHT ARM  Result Value Ref Range Status   Specimen Description BLOOD RIGHT ARM  Final   Special Requests   Final    BOTTLES DRAWN AEROBIC ONLY Blood Culture results may not be optimal due to an inadequate volume of blood received in culture bottles   Culture   Final    NO GROWTH 5 DAYS Performed at [facility], [place] [address] [place], [place] [address]    Report Status 08/06/2020 FINAL  Final  Culture, Respiratory w Gram Stain     Status: None   Collection Time: 08/01/20  2:55 PM   Specimen: Tracheal Aspirate;  Respiratory  Result Value Ref Range Status   Specimen Description TRACHEAL ASPIRATE  Final   Special Requests NONE  Final   Gram Stain   Final    FEW WBC PRESENT, PREDOMINANTLY PMN MODERATE GRAM NEGATIVE RODS Performed at [facility], [place] [address], [place], [place] [address]    Culture ABUNDANT PSEUDOMONAS AERUGINOSA  Final   Report Status 08/04/2020 FINAL  Final   Organism ID, Bacteria PSEUDOMONAS AERUGINOSA  Final      Susceptibility   Pseudomonas aeruginosa - MIC*    CEFTAZIDIME <=1 SENSITIVE Sensitive     CIPROFLOXACIN <=0.25 SENSITIVE Sensitive     GENTAMICIN 8 INTERMEDIATE Intermediate     IMIPENEM >=16 RESISTANT Resistant     PIP/TAZO <=4 SENSITIVE Sensitive     CEFEPIME 2 SENSITIVE Sensitive     * ABUNDANT PSEUDOMONAS AERUGINOSA  Culture, Respiratory w Gram Stain     Status: None   Collection Time: 09/17/20  5:21 PM   Specimen: Tracheal Aspirate; Respiratory  Result Value Ref Range Status   Specimen Description TRACHEAL ASPIRATE  Final   Special Requests NONE  Final   Gram Stain   Final    ABUNDANT WBC PRESENT,BOTH PMN AND MONONUCLEAR MODERATE SQUAMOUS EPITHELIAL CELLS PRESENT MODERATE GRAM POSITIVE COCCI MODERATE GRAM NEGATIVE RODS Performed at [facility], [place] [address], [place], [place] [address]    Culture FEW PSEUDOMONAS AERUGINOSA  Final   Report Status 09/22/2020 FINAL  Final   Organism ID, Bacteria PSEUDOMONAS AERUGINOSA  Final      Susceptibility   Pseudomonas aeruginosa - MIC*    CEFTAZIDIME 2 SENSITIVE Sensitive     CIPROFLOXACIN 1 SENSITIVE Sensitive     GENTAMICIN >=16 RESISTANT Resistant     IMIPENEM >=16 RESISTANT Resistant     PIP/TAZO <=4 SENSITIVE Sensitive     * FEW PSEUDOMONAS AERUGINOSA  Culture, blood (routine x 2)     Status: None   Collection Time: 09/18/20 12:05 PM   Specimen: BLOOD RIGHT ARM  Result Value Ref Range Status   Specimen Description BLOOD RIGHT ARM  Final   Special Requests   Final    BOTTLES DRAWN  AEROBIC AND ANAEROBIC Blood Culture adequate volume   Culture   Final    NO GROWTH 5 DAYS Performed at [facility], [address] [place], [place] [address]    Report Status 09/23/2020 FINAL  Final  Culture, blood (routine x 2)  Status: None   Collection Time: 09/18/20 12:05 PM   Specimen: BLOOD RIGHT HAND  Result Value Ref Range Status   Specimen Description BLOOD RIGHT HAND  Final   Special Requests   Final    BOTTLES DRAWN AEROBIC AND ANAEROBIC Blood Culture adequate volume   Culture   Final    NO GROWTH 5 DAYS Performed at [facility], [address] [place], [place] [address]    Report Status 09/23/2020 FINAL  Final  Culture, Respiratory w Gram Stain     Status: None   Collection Time: 09/29/20 12:06 PM   Specimen: Tracheal Aspirate; Respiratory  Result Value Ref Range Status   Specimen Description TRACHEAL ASPIRATE  Final   Special Requests NONE  Final   Gram Stain   Final    RARE SQUAMOUS EPITHELIAL CELLS PRESENT ABUNDANT WBC PRESENT,BOTH PMN AND MONONUCLEAR ABUNDANT GRAM NEGATIVE RODS    Culture   Final    FEW PSEUDOMONAS AERUGINOSA MULTI-DRUG RESISTANT ORGANISM CRITICAL RESULT CALLED TO, READ BACK BY AND VERIFIED WITH: [place] RN @1145  10/03/20 EB Performed at [facility], [place] [address], [place], [place] [address]    Report Status 10/03/2020 FINAL  Final   Organism ID, Bacteria PSEUDOMONAS AERUGINOSA  Final      Susceptibility   Pseudomonas aeruginosa - MIC*    CEFTAZIDIME 16 INTERMEDIATE Intermediate     CIPROFLOXACIN 0.5 SENSITIVE Sensitive     GENTAMICIN >=16 RESISTANT Resistant     IMIPENEM >=16 RESISTANT Resistant     * FEW PSEUDOMONAS AERUGINOSA  Culture, blood (routine x 2)     Status: Abnormal   Collection Time: 09/29/20  1:07 PM   Specimen: BLOOD RIGHT ARM  Result Value Ref Range Status   Specimen Description BLOOD RIGHT ARM  Final   Special Requests   Final    BOTTLES DRAWN AEROBIC AND ANAEROBIC Blood Culture adequate  volume   Culture  Setup Time   Final    GRAM POSITIVE COCCI IN CHAINS AEROBIC BOTTLE ONLY CRITICAL RESULT CALLED TO, READ BACK BY AND VERIFIED WITH: RN S.SNED AT 1233 ON 09/30/2020 BY T.SAAD. Performed at [facility], [place] [address], [place], [place] [address]    Culture VANCOMYCIN RESISTANT ENTEROCOCCUS (A)  Final   Report Status 10/02/2020 FINAL  Final   Organism ID, Bacteria VANCOMYCIN RESISTANT ENTEROCOCCUS  Final      Susceptibility   Vancomycin resistant enterococcus - MIC*    AMPICILLIN <=2 SENSITIVE Sensitive     VANCOMYCIN >=32 RESISTANT Resistant     GENTAMICIN SYNERGY SENSITIVE Sensitive     LINEZOLID 2 SENSITIVE Sensitive     * VANCOMYCIN RESISTANT ENTEROCOCCUS  Blood Culture ID Panel (Reflexed)     Status: Abnormal   Collection Time: 09/29/20  1:07 PM  Result Value Ref Range Status   Enterococcus faecalis DETECTED (A) NOT DETECTED Final    Comment: CRITICAL RESULT CALLED TO, READ BACK BY AND VERIFIED WITH: RN S.SNED AT 1233 ON 09/30/2020 BY T.SAAD.    Enterococcus Faecium NOT DETECTED NOT DETECTED Final   Listeria monocytogenes NOT DETECTED NOT DETECTED Final   Staphylococcus species NOT DETECTED NOT DETECTED Final   Staphylococcus aureus (BCID) NOT DETECTED NOT DETECTED Final   Staphylococcus epidermidis NOT DETECTED NOT DETECTED Final   Staphylococcus lugdunensis NOT DETECTED NOT DETECTED Final   Streptococcus species NOT DETECTED NOT DETECTED Final   Streptococcus agalactiae NOT DETECTED NOT DETECTED Final   Streptococcus pneumoniae NOT DETECTED NOT DETECTED Final  Streptococcus pyogenes NOT DETECTED NOT DETECTED Final   A.calcoaceticus-baumannii NOT DETECTED NOT DETECTED Final   Bacteroides fragilis NOT DETECTED NOT DETECTED Final   Enterobacterales NOT DETECTED NOT DETECTED Final   Enterobacter cloacae complex NOT DETECTED NOT DETECTED Final   Escherichia coli NOT DETECTED NOT DETECTED Final   Klebsiella aerogenes NOT DETECTED NOT DETECTED Final    Klebsiella oxytoca NOT DETECTED NOT DETECTED Final   Klebsiella pneumoniae NOT DETECTED NOT DETECTED Final   Proteus species NOT DETECTED NOT DETECTED Final   Salmonella species NOT DETECTED NOT DETECTED Final   Serratia marcescens NOT DETECTED NOT DETECTED Final   Haemophilus influenzae NOT DETECTED NOT DETECTED Final   Neisseria meningitidis NOT DETECTED NOT DETECTED Final   Pseudomonas aeruginosa NOT DETECTED NOT DETECTED Final   Stenotrophomonas maltophilia NOT DETECTED NOT DETECTED Final   Candida albicans NOT DETECTED NOT DETECTED Final   Candida auris NOT DETECTED NOT DETECTED Final   Candida glabrata NOT DETECTED NOT DETECTED Final   Candida krusei NOT DETECTED NOT DETECTED Final   Candida parapsilosis NOT DETECTED NOT DETECTED Final   Candida tropicalis NOT DETECTED NOT DETECTED Final   Cryptococcus neoformans/gattii NOT DETECTED NOT DETECTED Final   Vancomycin resistance DETECTED (A) NOT DETECTED Final    Comment: CRITICAL RESULT CALLED TO, READ BACK BY AND VERIFIED WITH: RN S.SNED AT 1233 ON 09/30/2020 BY T.SAAD. Performed at [facility], [place] [address], [place], [place] [address]   Culture, blood (routine x 2)     Status: None   Collection Time: 09/29/20  1:14 PM   Specimen: BLOOD RIGHT ARM  Result Value Ref Range Status   Specimen Description BLOOD RIGHT ARM  Final   Special Requests   Final    BOTTLES DRAWN AEROBIC AND ANAEROBIC Blood Culture adequate volume   Culture   Final    NO GROWTH 5 DAYS Performed at [facility], [place] [address] [place], [place] [address]    Report Status 10/04/2020 FINAL  Final  Culture, blood (routine x 2)     Status: None   Collection Time: 10/20/20  9:57 AM   Specimen: BLOOD RIGHT HAND  Result Value Ref Range Status   Specimen Description BLOOD RIGHT HAND  Final   Special Requests   Final    BOTTLES DRAWN AEROBIC ONLY Blood Culture adequate volume   Culture   Final    NO GROWTH 5 DAYS Performed at [place], [place] [address] [place], [place] [address]    Report Status 10/25/2020 FINAL  Final  Culture, blood (routine x 2)     Status: None   Collection Time: 10/20/20  9:57 AM   Specimen: BLOOD RIGHT HAND  Result Value Ref Range Status   Specimen Description BLOOD RIGHT HAND  Final   Special Requests   Final    BOTTLES DRAWN AEROBIC ONLY Blood Culture adequate volume   Culture   Final    NO GROWTH 5 DAYS Performed at [facility], [place] [address] [place], [place] [address]    Report Status 10/25/2020 FINAL  Final  Culture, blood (routine x 2)     Status: None   Collection Time: 11/27/20  9:33 PM   Specimen: BLOOD RIGHT HAND  Result Value Ref Range Status   Specimen Description BLOOD RIGHT HAND  Final   Special Requests AEROBIC BOTTLE ONLY Blood Culture adequate volume  Final   Culture   Final    NO GROWTH 5  DAYS Performed at [facility], [place] [address], [place], [place] [address]    Report Status 12/02/2020 FINAL  Final  Culture, blood (routine x 2)     Status: None   Collection Time: 11/27/20  9:33 PM   Specimen: BLOOD  Result Value Ref Range Status   Specimen Description BLOOD RIGHT ANTECUBITAL  Final   Special Requests   Final    BOTTLES DRAWN AEROBIC AND ANAEROBIC Blood Culture adequate volume   Culture   Final    NO GROWTH 5 DAYS Performed at [facility], [place] [address], [place], [place] [address]    Report Status 12/02/2020 FINAL  Final  Culture, Respiratory w Gram Stain     Status: None   Collection Time: 12/02/20  3:29 PM   Specimen: Tracheal Aspirate; Respiratory  Result Value Ref Range Status   Specimen Description TRACHEAL ASPIRATE  Final   Special Requests NONE  Final   Gram Stain   Final    FEW SQUAMOUS EPITHELIAL CELLS PRESENT FEW WBC SEEN FEW GRAM POSITIVE COCCI MODERATE GRAM NEGATIVE RODS Performed at [facility], [place] [address] [place], [place] [address]    Culture ABUNDANT PSEUDOMONAS AERUGINOSA  Final   Report Status  12/06/2020 FINAL  Final   Organism ID, Bacteria PSEUDOMONAS AERUGINOSA  Final      Susceptibility   Pseudomonas aeruginosa - MIC*    CEFTAZIDIME 2 SENSITIVE Sensitive     CIPROFLOXACIN 2 RESISTANT Resistant     GENTAMICIN >=16 RESISTANT Resistant     IMIPENEM >=16 RESISTANT Resistant     PIP/TAZO <=4 SENSITIVE Sensitive     * ABUNDANT PSEUDOMONAS AERUGINOSA    Coagulation Studies: No results for input(s): LABPROT, INR in the last 72 hours.   Urinalysis: No results for input(s): COLORURINE, LABSPEC, PHURINE, GLUCOSEU, HGBUR, BILIRUBINUR, KETONESUR, PROTEINUR, UROBILINOGEN, NITRITE, LEUKOCYTESUR in the last 72 hours.  Invalid input(s): APPERANCEUR    Imaging: No results found.   Medications:       Assessment/ Plan:  45 y.o. Andrews with a PMHx of ESRD on HD, posterior reversible encephalopathy syndrome, diabetes mellitus type 2, chronic systolic heart failure, anemia of chronic kidney disease, secondary hyperparathyroidism, malignant hypertension, acute respiratory failure, who was admitted to [facility] on 06/29/2020 for ongoing treatment of acute respiratory failure, posterior reversible encephalopathy syndrome, and end-stage renal disease.   1.  ESRD on HD.  Patient continues to tolerate dialysis treatments well on MWF schedule.  He will be due for dialysis treatment today.  2.  Anemia of chronic kidney disease.  Hemoglobin up to 7.6 posttransfusion.  Continue to monitor CBC.  3.  Secondary hyperparathyroidism.  Repeat serum phosphorus today.  4.  Acute respiratory failure.  Tracheostomy placed 07/17/2020.  Patient continues to be on T-piece collar.  Pseudomonas now growing in respiratory culture.  5.  Hypertension.  Blood pressure continues to be quite labile and was actually high this morning at 205/103.  There have been some issues with obtaining accurate blood pressures given significant edema in his upper extremities.   Continue current antihypertensive  regimen.   Yitta Gongaware 12/9/20228:39 AM

## 2020-12-14 LAB — CBC
HCT: 25.1 % — ABNORMAL LOW (ref 39.0–52.0)
Hemoglobin: 7.7 g/dL — ABNORMAL LOW (ref 13.0–17.0)
MCH: 24.4 pg — ABNORMAL LOW (ref 26.0–34.0)
MCHC: 30.7 g/dL (ref 30.0–36.0)
MCV: 79.7 fL — ABNORMAL LOW (ref 80.0–100.0)
Platelets: 238 10*3/uL (ref 150–400)
RBC: 3.15 MIL/uL — ABNORMAL LOW (ref 4.22–5.81)
RDW: 16.9 % — ABNORMAL HIGH (ref 11.5–15.5)
WBC: 10 10*3/uL (ref 4.0–10.5)
nRBC: 0 % (ref 0.0–0.2)

## 2020-12-14 LAB — RENAL FUNCTION PANEL
Albumin: 1.6 g/dL — ABNORMAL LOW (ref 3.5–5.0)
Anion gap: 10 (ref 5–15)
BUN: 62 mg/dL — ABNORMAL HIGH (ref 6–20)
CO2: 28 mmol/L (ref 22–32)
Calcium: 9.1 mg/dL (ref 8.9–10.3)
Chloride: 95 mmol/L — ABNORMAL LOW (ref 98–111)
Creatinine, Ser: 2.51 mg/dL — ABNORMAL HIGH (ref 0.61–1.24)
GFR, Estimated: 31 mL/min — ABNORMAL LOW (ref >60)
Glucose, Bld: 110 mg/dL — ABNORMAL HIGH (ref 70–99)
Phosphorus: 2.2 mg/dL — ABNORMAL LOW (ref 2.5–4.6)
Potassium: 4.1 mmol/L (ref 3.5–5.1)
Sodium: 133 mmol/L — ABNORMAL LOW (ref 135–145)

## 2020-12-14 NOTE — Progress Notes (Signed)
[facility]  ROUNDING NOTE   Subjective:  Patient sitting up in chair at the moment this AM. Not responding to voice. Due for dialysis treatment later today.   Objective:  Vital signs in last 24 hours:  Temperature 97.5 pulse 62 respirations 22 blood pressure 194/90  Physical Exam: General:  No acute distress  Head:  Normocephalic, atraumatic.    Eyes:  Anicteric  Neck:  Tracheostomy in place  Lungs:   Scattered rhonchi, normal effort  Heart:  S1S2 no rubs  Abdomen:   Soft, nontender, bowel sounds present  Extremities:  2+ bilateral upper extremity edema  Neurologic:  Bilateral hand contractures, not following commands  Skin:  No acute skin rash  Access:  Left upper extremity AV fistula    Basic Metabolic Panel: Recent Labs  Lab 12/09/20 0940  NA 133*  K 4.2  CL 97*  CO2 29  GLUCOSE 117*  BUN 61*  CREATININE 2.49*  CALCIUM 8.5*  PHOS 2.4*     Liver Function Tests: Recent Labs  Lab 12/09/20 0940  ALBUMIN 1.7*    No results for input(s): LIPASE, AMYLASE in the last 168 hours. No results for input(s): AMMONIA in the last 168 hours.  CBC: Recent Labs  Lab 12/09/20 0950 12/10/20 0411  WBC 10.8*  --   HGB 6.6* 7.6*  HCT 22.0* 25.5*  MCV 79.7*  --   PLT 198  --      Cardiac Enzymes: No results for input(s): CKTOTAL, CKMB, CKMBINDEX, TROPONINI in the last 168 hours.  BNP: Invalid input(s): POCBNP  CBG: No results for input(s): GLUCAP in the last 168 hours.  Microbiology: Results for orders placed or performed during the hospital encounter of 07/17/20  Culture, blood (routine x 2)     Status: None   Collection Time: 07/27/20 11:17 AM   Specimen: BLOOD RIGHT ARM  Result Value Ref Range Status   Specimen Description BLOOD RIGHT ARM  Final   Special Requests   Final    BOTTLES DRAWN AEROBIC ONLY Blood Culture results may not be optimal due to an inadequate volume of blood received in culture bottles   Culture   Final    NO GROWTH  5 DAYS Performed at [facility], [place] [address] [place], [place] [address]    Report Status 08/01/2020 FINAL  Final  Culture, blood (routine x 2)     Status: None   Collection Time: 07/27/20 11:23 AM   Specimen: BLOOD RIGHT HAND  Result Value Ref Range Status   Specimen Description BLOOD RIGHT HAND  Final   Special Requests   Final    BOTTLES DRAWN AEROBIC ONLY Blood Culture results may not be optimal due to an inadequate volume of blood received in culture bottles   Culture   Final    NO GROWTH 5 DAYS Performed at [facility], [place] [address] [place], [place] [address]    Report Status 08/01/2020 FINAL  Final  Culture, blood (routine x 2)     Status: None   Collection Time: 08/01/20  2:25 PM   Specimen: BLOOD RIGHT HAND  Result Value Ref Range Status   Specimen Description BLOOD RIGHT HAND  Final   Special Requests   Final    BOTTLES DRAWN AEROBIC AND ANAEROBIC Blood Culture results may not be optimal due to an inadequate volume of blood received in culture bottles   Culture   Final    NO GROWTH 5 DAYS Performed at [facility], [place] [address], [place], [place] [address]    Report Status 08/06/2020 FINAL  Final  Culture, blood (routine x 2)     Status: None   Collection Time: 08/01/20  2:39 PM   Specimen: BLOOD RIGHT ARM  Result Value Ref Range Status   Specimen Description BLOOD RIGHT ARM  Final   Special Requests   Final    BOTTLES DRAWN AEROBIC ONLY Blood Culture results may not be optimal due to an inadequate volume of blood received in culture bottles   Culture   Final    NO GROWTH 5 DAYS Performed at [facility], [place] [address], [place], [place] [address]    Report Status 08/06/2020 FINAL  Final  Culture, Respiratory w Gram Stain     Status: None   Collection Time: 08/01/20  2:55 PM   Specimen: Tracheal Aspirate; Respiratory  Result Value Ref Range Status   Specimen Description TRACHEAL ASPIRATE  Final   Special Requests NONE  Final    Gram Stain   Final    FEW WBC PRESENT, PREDOMINANTLY PMN MODERATE GRAM NEGATIVE RODS Performed at [facility], [place] [address], [place], [place] [address]    Culture ABUNDANT PSEUDOMONAS AERUGINOSA  Final   Report Status 08/04/2020 FINAL  Final   Organism ID, Bacteria PSEUDOMONAS AERUGINOSA  Final      Susceptibility   Pseudomonas aeruginosa - MIC*    CEFTAZIDIME <=1 SENSITIVE Sensitive     CIPROFLOXACIN <=0.25 SENSITIVE Sensitive     GENTAMICIN 8 INTERMEDIATE Intermediate     IMIPENEM >=16 RESISTANT Resistant     PIP/TAZO <=4 SENSITIVE Sensitive     CEFEPIME 2 SENSITIVE Sensitive     * ABUNDANT PSEUDOMONAS AERUGINOSA  Culture, Respiratory w Gram Stain     Status: None   Collection Time: 09/17/20  5:21 PM   Specimen: Tracheal Aspirate; Respiratory  Result Value Ref Range Status   Specimen Description TRACHEAL ASPIRATE  Final   Special Requests NONE  Final   Gram Stain   Final    ABUNDANT WBC PRESENT,BOTH PMN AND MONONUCLEAR MODERATE SQUAMOUS EPITHELIAL CELLS PRESENT MODERATE GRAM POSITIVE COCCI MODERATE GRAM NEGATIVE RODS Performed at [facility], [place] [address], [place], [place] [address]    Culture FEW PSEUDOMONAS AERUGINOSA  Final   Report Status 09/22/2020 FINAL  Final   Organism ID, Bacteria PSEUDOMONAS AERUGINOSA  Final      Susceptibility   Pseudomonas aeruginosa - MIC*    CEFTAZIDIME 2 SENSITIVE Sensitive     CIPROFLOXACIN 1 SENSITIVE Sensitive     GENTAMICIN >=16 RESISTANT Resistant     IMIPENEM >=16 RESISTANT Resistant     PIP/TAZO <=4 SENSITIVE Sensitive     * FEW PSEUDOMONAS AERUGINOSA  Culture, blood (routine x 2)     Status: None   Collection Time: 09/18/20 12:05 PM   Specimen: BLOOD RIGHT ARM  Result Value Ref Range Status   Specimen Description BLOOD RIGHT ARM  Final   Special Requests   Final    BOTTLES DRAWN AEROBIC AND ANAEROBIC Blood Culture adequate volume   Culture   Final    NO GROWTH 5 DAYS Performed at [facility],  [address], [place],  [address]    Report Status 09/23/2020 FINAL  Final  Culture, blood (routine x 2)     Status: None   Collection Time: 09/18/20 12:05 PM   Specimen: BLOOD RIGHT HAND  Result Value Ref Range Status  Specimen Description BLOOD RIGHT HAND  Final   Special Requests   Final    BOTTLES DRAWN AEROBIC AND ANAEROBIC Blood Culture adequate volume   Culture   Final    NO GROWTH 5 DAYS Performed at [facility], [address] [place], [place] [address]    Report Status 09/23/2020 FINAL  Final  Culture, Respiratory w Gram Stain     Status: None   Collection Time: 09/29/20 12:06 PM   Specimen: Tracheal Aspirate; Respiratory  Result Value Ref Range Status   Specimen Description TRACHEAL ASPIRATE  Final   Special Requests NONE  Final   Gram Stain   Final    RARE SQUAMOUS EPITHELIAL CELLS PRESENT ABUNDANT WBC PRESENT,BOTH PMN AND MONONUCLEAR ABUNDANT GRAM NEGATIVE RODS    Culture   Final    FEW PSEUDOMONAS AERUGINOSA MULTI-DRUG RESISTANT ORGANISM CRITICAL RESULT CALLED TO, READ BACK BY AND VERIFIED WITH: [place] RN @1145  10/03/20 EB Performed at [facility], [place] [address], [place], [place] [address]    Report Status 10/03/2020 FINAL  Final   Organism ID, Bacteria PSEUDOMONAS AERUGINOSA  Final      Susceptibility   Pseudomonas aeruginosa - MIC*    CEFTAZIDIME 16 INTERMEDIATE Intermediate     CIPROFLOXACIN 0.5 SENSITIVE Sensitive     GENTAMICIN >=16 RESISTANT Resistant     IMIPENEM >=16 RESISTANT Resistant     * FEW PSEUDOMONAS AERUGINOSA  Culture, blood (routine x 2)     Status: Abnormal   Collection Time: 09/29/20  1:07 PM   Specimen: BLOOD RIGHT ARM  Result Value Ref Range Status   Specimen Description BLOOD RIGHT ARM  Final   Special Requests   Final    BOTTLES DRAWN AEROBIC AND ANAEROBIC Blood Culture adequate volume   Culture  Setup Time   Final    GRAM POSITIVE COCCI IN CHAINS AEROBIC BOTTLE ONLY CRITICAL RESULT CALLED TO, READ BACK BY  AND VERIFIED WITH: RN S.SNED AT 1233 ON 09/30/2020 BY T.SAAD. Performed at [facility], [place] [address] [place], [place] [address]    Culture VANCOMYCIN RESISTANT ENTEROCOCCUS (A)  Final   Report Status 10/02/2020 FINAL  Final   Organism ID, Bacteria VANCOMYCIN RESISTANT ENTEROCOCCUS  Final      Susceptibility   Vancomycin resistant enterococcus - MIC*    AMPICILLIN <=2 SENSITIVE Sensitive     VANCOMYCIN >=32 RESISTANT Resistant     GENTAMICIN SYNERGY SENSITIVE Sensitive     LINEZOLID 2 SENSITIVE Sensitive     * VANCOMYCIN RESISTANT ENTEROCOCCUS  Blood Culture ID Panel (Reflexed)     Status: Abnormal   Collection Time: 09/29/20  1:07 PM  Result Value Ref Range Status   Enterococcus faecalis DETECTED (A) NOT DETECTED Final    Comment: CRITICAL RESULT CALLED TO, READ BACK BY AND VERIFIED WITH: RN S.SNED AT 1233 ON 09/30/2020 BY T.SAAD.    Enterococcus Faecium NOT DETECTED NOT DETECTED Final   Listeria monocytogenes NOT DETECTED NOT DETECTED Final   Staphylococcus species NOT DETECTED NOT DETECTED Final   Staphylococcus aureus (BCID) NOT DETECTED NOT DETECTED Final   Staphylococcus epidermidis NOT DETECTED NOT DETECTED Final   Staphylococcus lugdunensis NOT DETECTED NOT DETECTED Final   Streptococcus species NOT DETECTED NOT DETECTED Final   Streptococcus agalactiae NOT DETECTED NOT DETECTED Final   Streptococcus pneumoniae NOT DETECTED NOT DETECTED Final   Streptococcus pyogenes NOT DETECTED NOT DETECTED Final   A.calcoaceticus-baumannii NOT DETECTED NOT DETECTED Final   Bacteroides fragilis NOT DETECTED  NOT DETECTED Final   Enterobacterales NOT DETECTED NOT DETECTED Final   Enterobacter cloacae complex NOT DETECTED NOT DETECTED Final   Escherichia coli NOT DETECTED NOT DETECTED Final   Klebsiella aerogenes NOT DETECTED NOT DETECTED Final   Klebsiella oxytoca NOT DETECTED NOT DETECTED Final   Klebsiella pneumoniae NOT DETECTED NOT DETECTED Final   Proteus species NOT  DETECTED NOT DETECTED Final   Salmonella species NOT DETECTED NOT DETECTED Final   Serratia marcescens NOT DETECTED NOT DETECTED Final   Haemophilus influenzae NOT DETECTED NOT DETECTED Final   Neisseria meningitidis NOT DETECTED NOT DETECTED Final   Pseudomonas aeruginosa NOT DETECTED NOT DETECTED Final   Stenotrophomonas maltophilia NOT DETECTED NOT DETECTED Final   Candida albicans NOT DETECTED NOT DETECTED Final   Candida auris NOT DETECTED NOT DETECTED Final   Candida glabrata NOT DETECTED NOT DETECTED Final   Candida krusei NOT DETECTED NOT DETECTED Final   Candida parapsilosis NOT DETECTED NOT DETECTED Final   Candida tropicalis NOT DETECTED NOT DETECTED Final   Cryptococcus neoformans/gattii NOT DETECTED NOT DETECTED Final   Vancomycin resistance DETECTED (A) NOT DETECTED Final    Comment: CRITICAL RESULT CALLED TO, READ BACK BY AND VERIFIED WITH: RN S.SNED AT 1233 ON 09/30/2020 BY T.SAAD. Performed at [facility], [place] [address], [place], [place] [address]   Culture, blood (routine x 2)     Status: None   Collection Time: 09/29/20  1:14 PM   Specimen: BLOOD RIGHT ARM  Result Value Ref Range Status   Specimen Description BLOOD RIGHT ARM  Final   Special Requests   Final    BOTTLES DRAWN AEROBIC AND ANAEROBIC Blood Culture adequate volume   Culture   Final    NO GROWTH 5 DAYS Performed at [facility], [place] [address], [place], [place] [address]    Report Status 10/04/2020 FINAL  Final  Culture, blood (routine x 2)     Status: None   Collection Time: 10/20/20  9:57 AM   Specimen: BLOOD RIGHT HAND  Result Value Ref Range Status   Specimen Description BLOOD RIGHT HAND  Final   Special Requests   Final    BOTTLES DRAWN AEROBIC ONLY Blood Culture adequate volume   Culture   Final    NO GROWTH 5 DAYS Performed at [facility], [place] [address] [place], [place] [address]    Report Status 10/25/2020 FINAL  Final  Culture, blood (routine x 2)     Status:  None   Collection Time: 10/20/20  9:57 AM   Specimen: BLOOD RIGHT HAND  Result Value Ref Range Status   Specimen Description BLOOD RIGHT HAND  Final   Special Requests   Final    BOTTLES DRAWN AEROBIC ONLY Blood Culture adequate volume   Culture   Final    NO GROWTH 5 DAYS Performed at [facility], [place] [address], [place], [place] [address]    Report Status 10/25/2020 FINAL  Final  Culture, blood (routine x 2)     Status: None   Collection Time: 11/27/20  9:33 PM   Specimen: BLOOD RIGHT HAND  Result Value Ref Range Status   Specimen Description BLOOD RIGHT HAND  Final   Special Requests AEROBIC BOTTLE ONLY Blood Culture adequate volume  Final   Culture   Final    NO GROWTH 5 DAYS Performed at [facility], [place] [address], [place], [place] [address]    Report Status 12/02/2020 FINAL  Final  Culture, blood (routine x 2)     Status: None   Collection Time: 11/27/20  9:33 PM   Specimen: BLOOD  Result Value Ref Range Status   Specimen Description BLOOD RIGHT ANTECUBITAL  Final   Special Requests   Final    BOTTLES DRAWN AEROBIC AND ANAEROBIC Blood Culture adequate volume   Culture   Final    NO GROWTH 5 DAYS Performed at [facility], [address] [place], [place] [address]    Report Status 12/02/2020 FINAL  Final  Culture, Respiratory w Gram Stain     Status: None   Collection Time: 12/02/20  3:29 PM   Specimen: Tracheal Aspirate; Respiratory  Result Value Ref Range Status   Specimen Description TRACHEAL ASPIRATE  Final   Special Requests NONE  Final   Gram Stain   Final    FEW SQUAMOUS EPITHELIAL CELLS PRESENT FEW WBC SEEN FEW GRAM POSITIVE COCCI MODERATE GRAM NEGATIVE RODS Performed at [facility], [place] [address] [place], [place] [address]    Culture ABUNDANT PSEUDOMONAS AERUGINOSA  Final   Report Status 12/06/2020 FINAL  Final   Organism ID, Bacteria PSEUDOMONAS AERUGINOSA  Final      Susceptibility   Pseudomonas aeruginosa - MIC*     CEFTAZIDIME 2 SENSITIVE Sensitive     CIPROFLOXACIN 2 RESISTANT Resistant     GENTAMICIN >=16 RESISTANT Resistant     IMIPENEM >=16 RESISTANT Resistant     PIP/TAZO <=4 SENSITIVE Sensitive     * ABUNDANT PSEUDOMONAS AERUGINOSA    Coagulation Studies: No results for input(s): LABPROT, INR in the last 72 hours.   Urinalysis: No results for input(s): COLORURINE, LABSPEC, PHURINE, GLUCOSEU, HGBUR, BILIRUBINUR, KETONESUR, PROTEINUR, UROBILINOGEN, NITRITE, LEUKOCYTESUR in the last 72 hours.  Invalid input(s): APPERANCEUR    Imaging: No results found.   Medications:       Assessment/ Plan:  45 y.o. male with a PMHx of ESRD on HD, posterior reversible encephalopathy syndrome, diabetes mellitus type 2, chronic systolic heart failure, anemia of chronic kidney disease, secondary hyperparathyroidism, malignant hypertension, acute respiratory failure, who was admitted to [facility] on 06/29/2020 for ongoing treatment of acute respiratory failure, posterior reversible encephalopathy syndrome, and end-stage renal disease.   1.  ESRD on HD.  We are planning for hemodialysis treatment today.  Orders have been prepared.  2.  Anemia of chronic kidney disease.  Hemoglobin was as low as 6.6 last week.  He was transfused 1 unit.  Hemoglobin 7.6 thereafter.  Continue to monitor.  3.  Secondary hyperparathyroidism.  Continue to monitor serum phosphorus periodically.  4.  Acute respiratory failure.  Tracheostomy placed 07/17/2020.  Patient continues to be on T-piece collar.  Pseudomonas now growing in respiratory culture.  5.  Hypertension.  Blood pressure continues to be labile and blood pressure earlier this a.m. was 194/90.  Continue current antihypertensive regimen.   Latonja Bobeck 12/12/20228:07 AM

## 2020-12-16 LAB — CBC
HCT: 25.7 % — ABNORMAL LOW (ref 39.0–52.0)
Hemoglobin: 7.9 g/dL — ABNORMAL LOW (ref 13.0–17.0)
MCH: 24.6 pg — ABNORMAL LOW (ref 26.0–34.0)
MCHC: 30.7 g/dL (ref 30.0–36.0)
MCV: 80.1 fL (ref 80.0–100.0)
Platelets: 204 10*3/uL (ref 150–400)
RBC: 3.21 MIL/uL — ABNORMAL LOW (ref 4.22–5.81)
RDW: 16.8 % — ABNORMAL HIGH (ref 11.5–15.5)
WBC: 8.4 10*3/uL (ref 4.0–10.5)
nRBC: 0 % (ref 0.0–0.2)

## 2020-12-16 LAB — RENAL FUNCTION PANEL
Albumin: 1.7 g/dL — ABNORMAL LOW (ref 3.5–5.0)
Anion gap: 8 (ref 5–15)
BUN: 24 mg/dL — ABNORMAL HIGH (ref 6–20)
CO2: 27 mmol/L (ref 22–32)
Calcium: 8.9 mg/dL (ref 8.9–10.3)
Chloride: 98 mmol/L (ref 98–111)
Creatinine, Ser: 1.22 mg/dL (ref 0.61–1.24)
GFR, Estimated: >60 mL/min (ref >60)
Glucose, Bld: 100 mg/dL — ABNORMAL HIGH (ref 70–99)
Phosphorus: 1.2 mg/dL — ABNORMAL LOW (ref 2.5–4.6)
Potassium: 3.3 mmol/L — ABNORMAL LOW (ref 3.5–5.1)
Sodium: 133 mmol/L — ABNORMAL LOW (ref 135–145)

## 2020-12-16 NOTE — Progress Notes (Signed)
[facility]  ROUNDING NOTE   Subjective:  Patient seen and evaluated during hemodialysis treatment this AM. Appears to be tolerating well. Left AV fistula functioning well.     Objective:  Vital signs in last 24 hours:  Temperature 96.9 pulse 61 respirations 13 blood pressure 150/81  Physical Exam: General:  No acute distress  Head:  Normocephalic, atraumatic.    Eyes:  Anicteric  Neck:  Tracheostomy in place  Lungs:   Scattered rhonchi, normal effort  Heart:  S1S2 no rubs  Abdomen:   Soft, nontender, bowel sounds present  Extremities:  2+ bilateral upper extremity edema  Neurologic:  Bilateral hand contractures, not following commands  Skin:  No acute skin rash  Access:  Left upper extremity AV fistula    Basic Metabolic Panel: Recent Labs  Lab 12/09/20 0940 12/14/20 0832  NA 133* 133*  K 4.2 4.1  CL 97* 95*  CO2 29 28  GLUCOSE 117* 110*  BUN 61* 62*  CREATININE 2.49* 2.51*  CALCIUM 8.5* 9.1  PHOS 2.4* 2.2*     Liver Function Tests: Recent Labs  Lab 12/09/20 0940 12/14/20 0832  ALBUMIN 1.7* 1.6*    No results for input(s): LIPASE, AMYLASE in the last 168 hours. No results for input(s): AMMONIA in the last 168 hours.  CBC: Recent Labs  Lab 12/09/20 0950 12/10/20 0411 12/14/20 0832  WBC 10.8*  --  10.0  HGB 6.6* 7.6* 7.7*  HCT 22.0* 25.5* 25.1*  MCV 79.7*  --  79.7*  PLT 198  --  238     Cardiac Enzymes: No results for input(s): CKTOTAL, CKMB, CKMBINDEX, TROPONINI in the last 168 hours.  BNP: Invalid input(s): POCBNP  CBG: No results for input(s): GLUCAP in the last 168 hours.  Microbiology: Results for orders placed or performed during the hospital encounter of 07/17/20  Culture, blood (routine x 2)     Status: None   Collection Time: 07/27/20 11:17 AM   Specimen: BLOOD RIGHT ARM  Result Value Ref Range Status   Specimen Description BLOOD RIGHT ARM  Final   Special Requests   Final    BOTTLES DRAWN AEROBIC ONLY Blood  Culture results may not be optimal due to an inadequate volume of blood received in culture bottles   Culture   Final    NO GROWTH 5 DAYS Performed at [facility], [place] [address], [place], [place] [address]    Report Status 08/01/2020 FINAL  Final  Culture, blood (routine x 2)     Status: None   Collection Time: 07/27/20 11:23 AM   Specimen: BLOOD RIGHT HAND  Result Value Ref Range Status   Specimen Description BLOOD RIGHT HAND  Final   Special Requests   Final    BOTTLES DRAWN AEROBIC ONLY Blood Culture results may not be optimal due to an inadequate volume of blood received in culture bottles   Culture   Final    NO GROWTH 5 DAYS Performed at [facility], [place] [address] [place], [place] [address]    Report Status 08/01/2020 FINAL  Final  Culture, blood (routine x 2)     Status: None   Collection Time: 08/01/20  2:25 PM   Specimen: BLOOD RIGHT HAND  Result Value Ref Range Status   Specimen Description BLOOD RIGHT HAND  Final   Special Requests   Final    BOTTLES DRAWN AEROBIC AND ANAEROBIC Blood Culture results may not be optimal due to an inadequate volume  of blood received in culture bottles   Culture   Final    NO GROWTH 5 DAYS Performed at [facility], [place] [address] [place], [place] [address]    Report Status 08/06/2020 FINAL  Final  Culture, blood (routine x 2)     Status: None   Collection Time: 08/01/20  2:39 PM   Specimen: BLOOD RIGHT ARM  Result Value Ref Range Status   Specimen Description BLOOD RIGHT ARM  Final   Special Requests   Final    BOTTLES DRAWN AEROBIC ONLY Blood Culture results may not be optimal due to an inadequate volume of blood received in culture bottles   Culture   Final    NO GROWTH 5 DAYS Performed at [facility], [place] [address], [place], [place] [address]    Report Status 08/06/2020 FINAL  Final  Culture, Respiratory w Gram Stain     Status: None   Collection Time: 08/01/20  2:55 PM   Specimen: Tracheal  Aspirate; Respiratory  Result Value Ref Range Status   Specimen Description TRACHEAL ASPIRATE  Final   Special Requests NONE  Final   Gram Stain   Final    FEW WBC PRESENT, PREDOMINANTLY PMN MODERATE GRAM NEGATIVE RODS Performed at [facility], [place] [address], [place], [place] [address]    Culture ABUNDANT PSEUDOMONAS AERUGINOSA  Final   Report Status 08/04/2020 FINAL  Final   Organism ID, Bacteria PSEUDOMONAS AERUGINOSA  Final      Susceptibility   Pseudomonas aeruginosa - MIC*    CEFTAZIDIME <=1 SENSITIVE Sensitive     CIPROFLOXACIN <=0.25 SENSITIVE Sensitive     GENTAMICIN 8 INTERMEDIATE Intermediate     IMIPENEM >=16 RESISTANT Resistant     PIP/TAZO <=4 SENSITIVE Sensitive     CEFEPIME 2 SENSITIVE Sensitive     * ABUNDANT PSEUDOMONAS AERUGINOSA  Culture, Respiratory w Gram Stain     Status: None   Collection Time: 09/17/20  5:21 PM   Specimen: Tracheal Aspirate; Respiratory  Result Value Ref Range Status   Specimen Description TRACHEAL ASPIRATE  Final   Special Requests NONE  Final   Gram Stain   Final    ABUNDANT WBC PRESENT,BOTH PMN AND MONONUCLEAR MODERATE SQUAMOUS EPITHELIAL CELLS PRESENT MODERATE GRAM POSITIVE COCCI MODERATE GRAM NEGATIVE RODS Performed at [facility], [place] [address] [place], [place] [address]    Culture FEW PSEUDOMONAS AERUGINOSA  Final   Report Status 09/22/2020 FINAL  Final   Organism ID, Bacteria PSEUDOMONAS AERUGINOSA  Final      Susceptibility   Pseudomonas aeruginosa - MIC*    CEFTAZIDIME 2 SENSITIVE Sensitive     CIPROFLOXACIN 1 SENSITIVE Sensitive     GENTAMICIN >=16 RESISTANT Resistant     IMIPENEM >=16 RESISTANT Resistant     PIP/TAZO <=4 SENSITIVE Sensitive     * FEW PSEUDOMONAS AERUGINOSA  Culture, blood (routine x 2)     Status: None   Collection Time: 09/18/20 12:05 PM   Specimen: BLOOD RIGHT ARM  Result Value Ref Range Status   Specimen Description BLOOD RIGHT ARM  Final   Special Requests   Final    BOTTLES  DRAWN AEROBIC AND ANAEROBIC Blood Culture adequate volume   Culture   Final    NO GROWTH 5 DAYS Performed at [facility], [address] [place], [place] [address]    Report Status 09/23/2020 FINAL  Final  Culture, blood (routine x 2)  Status: None   Collection Time: 09/18/20 12:05 PM   Specimen: BLOOD RIGHT HAND  Result Value Ref Range Status   Specimen Description BLOOD RIGHT HAND  Final   Special Requests   Final    BOTTLES DRAWN AEROBIC AND ANAEROBIC Blood Culture adequate volume   Culture   Final    NO GROWTH 5 DAYS Performed at [facility], [address], [place], [place] [address]    Report Status 09/23/2020 FINAL  Final  Culture, Respiratory w Gram Stain     Status: None   Collection Time: 09/29/20 12:06 PM   Specimen: Tracheal Aspirate; Respiratory  Result Value Ref Range Status   Specimen Description TRACHEAL ASPIRATE  Final   Special Requests NONE  Final   Gram Stain   Final    RARE SQUAMOUS EPITHELIAL CELLS PRESENT ABUNDANT WBC PRESENT,BOTH PMN AND MONONUCLEAR ABUNDANT GRAM NEGATIVE RODS    Culture   Final    FEW PSEUDOMONAS AERUGINOSA MULTI-DRUG RESISTANT ORGANISM CRITICAL RESULT CALLED TO, READ BACK BY AND VERIFIED WITH: [place] RN @1145  10/03/20 EB Performed at [facility], [place] [address], [place], [place] [address]    Report Status 10/03/2020 FINAL  Final   Organism ID, Bacteria PSEUDOMONAS AERUGINOSA  Final      Susceptibility   Pseudomonas aeruginosa - MIC*    CEFTAZIDIME 16 INTERMEDIATE Intermediate     CIPROFLOXACIN 0.5 SENSITIVE Sensitive     GENTAMICIN >=16 RESISTANT Resistant     IMIPENEM >=16 RESISTANT Resistant     * FEW PSEUDOMONAS AERUGINOSA  Culture, blood (routine x 2)     Status: Abnormal   Collection Time: 09/29/20  1:07 PM   Specimen: BLOOD RIGHT ARM  Result Value Ref Range Status   Specimen Description BLOOD RIGHT ARM  Final   Special Requests   Final    BOTTLES DRAWN AEROBIC AND ANAEROBIC Blood Culture  adequate volume   Culture  Setup Time   Final    GRAM POSITIVE COCCI IN CHAINS AEROBIC BOTTLE ONLY CRITICAL RESULT CALLED TO, READ BACK BY AND VERIFIED WITH: RN S.SNED AT 1233 ON 09/30/2020 BY T.SAAD. Performed at [facility], [place] [address] [place], [place] [address]    Culture VANCOMYCIN RESISTANT ENTEROCOCCUS (A)  Final   Report Status 10/02/2020 FINAL  Final   Organism ID, Bacteria VANCOMYCIN RESISTANT ENTEROCOCCUS  Final      Susceptibility   Vancomycin resistant enterococcus - MIC*    AMPICILLIN <=2 SENSITIVE Sensitive     VANCOMYCIN >=32 RESISTANT Resistant     GENTAMICIN SYNERGY SENSITIVE Sensitive     LINEZOLID 2 SENSITIVE Sensitive     * VANCOMYCIN RESISTANT ENTEROCOCCUS  Blood Culture ID Panel (Reflexed)     Status: Abnormal   Collection Time: 09/29/20  1:07 PM  Result Value Ref Range Status   Enterococcus faecalis DETECTED (A) NOT DETECTED Final    Comment: CRITICAL RESULT CALLED TO, READ BACK BY AND VERIFIED WITH: RN S.SNED AT 1233 ON 09/30/2020 BY T.SAAD.    Enterococcus Faecium NOT DETECTED NOT DETECTED Final   Listeria monocytogenes NOT DETECTED NOT DETECTED Final   Staphylococcus species NOT DETECTED NOT DETECTED Final   Staphylococcus aureus (BCID) NOT DETECTED NOT DETECTED Final   Staphylococcus epidermidis NOT DETECTED NOT DETECTED Final   Staphylococcus lugdunensis NOT DETECTED NOT DETECTED Final   Streptococcus species NOT DETECTED NOT DETECTED Final   Streptococcus agalactiae NOT DETECTED NOT DETECTED Final   Streptococcus pneumoniae NOT DETECTED NOT DETECTED Final  Streptococcus pyogenes NOT DETECTED NOT DETECTED Final   A.calcoaceticus-baumannii NOT DETECTED NOT DETECTED Final   Bacteroides fragilis NOT DETECTED NOT DETECTED Final   Enterobacterales NOT DETECTED NOT DETECTED Final   Enterobacter cloacae complex NOT DETECTED NOT DETECTED Final   Escherichia coli NOT DETECTED NOT DETECTED Final   Klebsiella aerogenes NOT DETECTED NOT DETECTED  Final   Klebsiella oxytoca NOT DETECTED NOT DETECTED Final   Klebsiella pneumoniae NOT DETECTED NOT DETECTED Final   Proteus species NOT DETECTED NOT DETECTED Final   Salmonella species NOT DETECTED NOT DETECTED Final   Serratia marcescens NOT DETECTED NOT DETECTED Final   Haemophilus influenzae NOT DETECTED NOT DETECTED Final   Neisseria meningitidis NOT DETECTED NOT DETECTED Final   Pseudomonas aeruginosa NOT DETECTED NOT DETECTED Final   Stenotrophomonas maltophilia NOT DETECTED NOT DETECTED Final   Candida albicans NOT DETECTED NOT DETECTED Final   Candida auris NOT DETECTED NOT DETECTED Final   Candida glabrata NOT DETECTED NOT DETECTED Final   Candida krusei NOT DETECTED NOT DETECTED Final   Candida parapsilosis NOT DETECTED NOT DETECTED Final   Candida tropicalis NOT DETECTED NOT DETECTED Final   Cryptococcus neoformans/gattii NOT DETECTED NOT DETECTED Final   Vancomycin resistance DETECTED (A) NOT DETECTED Final    Comment: CRITICAL RESULT CALLED TO, READ BACK BY AND VERIFIED WITH: RN S.SNED AT 1233 ON 09/30/2020 BY T.SAAD. Performed at [facility], [place] [address] [place], [place] [address]   Culture, blood (routine x 2)     Status: None   Collection Time: 09/29/20  1:14 PM   Specimen: BLOOD RIGHT ARM  Result Value Ref Range Status   Specimen Description BLOOD RIGHT ARM  Final   Special Requests   Final    BOTTLES DRAWN AEROBIC AND ANAEROBIC Blood Culture adequate volume   Culture   Final    NO GROWTH 5 DAYS Performed at [facility], [place] [address] [place], [place] [address]    Report Status 10/04/2020 FINAL  Final  Culture, blood (routine x 2)     Status: None   Collection Time: 10/20/20  9:57 AM   Specimen: BLOOD RIGHT HAND  Result Value Ref Range Status   Specimen Description BLOOD RIGHT HAND  Final   Special Requests   Final    BOTTLES DRAWN AEROBIC ONLY Blood Culture adequate volume   Culture   Final    NO GROWTH 5 DAYS Performed at [facility], [place] [address] [place], [place] [address]    Report Status 10/25/2020 FINAL  Final  Culture, blood (routine x 2)     Status: None   Collection Time: 10/20/20  9:57 AM   Specimen: BLOOD RIGHT HAND  Result Value Ref Range Status   Specimen Description BLOOD RIGHT HAND  Final   Special Requests   Final    BOTTLES DRAWN AEROBIC ONLY Blood Culture adequate volume   Culture   Final    NO GROWTH 5 DAYS Performed at [facility], [place] [address] [place], [place] [address]    Report Status 10/25/2020 FINAL  Final  Culture, blood (routine x 2)     Status: None   Collection Time: 11/27/20  9:33 PM   Specimen: BLOOD RIGHT HAND  Result Value Ref Range Status   Specimen Description BLOOD RIGHT HAND  Final   Special Requests AEROBIC BOTTLE ONLY Blood Culture adequate volume  Final   Culture   Final    NO GROWTH 5  DAYS Performed at [facility], [place] [address], [place], [place] [address]    Report Status 12/02/2020 FINAL  Final  Culture, blood (routine x 2)     Status: None   Collection Time: 11/27/20  9:33 PM   Specimen: BLOOD  Result Value Ref Range Status   Specimen Description BLOOD RIGHT ANTECUBITAL  Final   Special Requests   Final    BOTTLES DRAWN AEROBIC AND ANAEROBIC Blood Culture adequate volume   Culture   Final    NO GROWTH 5 DAYS Performed at [facility], [place] [address] [place], [place] [address]    Report Status 12/02/2020 FINAL  Final  Culture, Respiratory w Gram Stain     Status: None   Collection Time: 12/02/20  3:29 PM   Specimen: Tracheal Aspirate; Respiratory  Result Value Ref Range Status   Specimen Description TRACHEAL ASPIRATE  Final   Special Requests NONE  Final   Gram Stain   Final    FEW SQUAMOUS EPITHELIAL CELLS PRESENT FEW WBC SEEN FEW GRAM POSITIVE COCCI MODERATE GRAM NEGATIVE RODS Performed at [facility], [place] [address], [place], [place] [address]    Culture ABUNDANT PSEUDOMONAS AERUGINOSA  Final   Report Status  12/06/2020 FINAL  Final   Organism ID, Bacteria PSEUDOMONAS AERUGINOSA  Final      Susceptibility   Pseudomonas aeruginosa - MIC*    CEFTAZIDIME 2 SENSITIVE Sensitive     CIPROFLOXACIN 2 RESISTANT Resistant     GENTAMICIN >=16 RESISTANT Resistant     IMIPENEM >=16 RESISTANT Resistant     PIP/TAZO <=4 SENSITIVE Sensitive     * ABUNDANT PSEUDOMONAS AERUGINOSA    Coagulation Studies: No results for input(s): LABPROT, INR in the last 72 hours.   Urinalysis: No results for input(s): COLORURINE, LABSPEC, PHURINE, GLUCOSEU, HGBUR, BILIRUBINUR, KETONESUR, PROTEINUR, UROBILINOGEN, NITRITE, LEUKOCYTESUR in the last 72 hours.  Invalid input(s): APPERANCEUR    Imaging: No results found.   Medications:       Assessment/ Plan:  45 y.o. male with a PMHx of ESRD on HD, posterior reversible encephalopathy syndrome, diabetes mellitus type 2, chronic systolic heart failure, anemia of chronic kidney disease, secondary hyperparathyroidism, malignant hypertension, acute respiratory failure, who was admitted to [facility] on 06/29/2020 for ongoing treatment of acute respiratory failure, posterior reversible encephalopathy syndrome, and end-stage renal disease.   1.  ESRD on HD.  Patient seen and evaluated during hemodialysis treatment.  Appears to be tolerating well.  We plan to complete dialysis treatment today.  2.  Anemia of chronic kidney disease.  Hemoglobin up to 7.7.  Received blood transfusion earlier in the week.  Continue to monitor.  3.  Secondary hyperparathyroidism.  Serum phosphorus 2.2 at last check.  Continue Parikh to monitor.  4.  Acute respiratory failure.  Tracheostomy placed 07/17/2020.  Patient continues to be on T-piece collar.  Pseudomonas growing in respiratory culture.  Respiratory status appears to be stable.  5.  Hypertension.  Blood pressure better this a.m. at 150/81.  Continue current antihypertensive regimen.   Kinan Safley 12/14/20228:40 AM

## 2020-12-18 LAB — CBC
HCT: 24.1 % — ABNORMAL LOW (ref 39.0–52.0)
Hemoglobin: 7.2 g/dL — ABNORMAL LOW (ref 13.0–17.0)
MCH: 24.2 pg — ABNORMAL LOW (ref 26.0–34.0)
MCHC: 29.9 g/dL — ABNORMAL LOW (ref 30.0–36.0)
MCV: 80.9 fL (ref 80.0–100.0)
Platelets: 169 10*3/uL (ref 150–400)
RBC: 2.98 MIL/uL — ABNORMAL LOW (ref 4.22–5.81)
RDW: 16.6 % — ABNORMAL HIGH (ref 11.5–15.5)
WBC: 11 10*3/uL — ABNORMAL HIGH (ref 4.0–10.5)
nRBC: 0 % (ref 0.0–0.2)

## 2020-12-18 LAB — RENAL FUNCTION PANEL
Albumin: 1.6 g/dL — ABNORMAL LOW (ref 3.5–5.0)
Anion gap: 10 (ref 5–15)
BUN: 55 mg/dL — ABNORMAL HIGH (ref 6–20)
CO2: 26 mmol/L (ref 22–32)
Calcium: 8.9 mg/dL (ref 8.9–10.3)
Chloride: 94 mmol/L — ABNORMAL LOW (ref 98–111)
Creatinine, Ser: 2.22 mg/dL — ABNORMAL HIGH (ref 0.61–1.24)
GFR, Estimated: 36 mL/min — ABNORMAL LOW (ref >60)
Glucose, Bld: 174 mg/dL — ABNORMAL HIGH (ref 70–99)
Phosphorus: 2.5 mg/dL (ref 2.5–4.6)
Potassium: 4.1 mmol/L (ref 3.5–5.1)
Sodium: 130 mmol/L — ABNORMAL LOW (ref 135–145)

## 2020-12-18 NOTE — Progress Notes (Signed)
[facility]  ROUNDING NOTE   Subjective:  Patient resting in bed this AM. Due for hemodialysis treatment later today.   Objective:  Vital signs in last 24 hours:  Temperature 97.8 pulse 84 respirations 13 blood pressure 188/99  Physical Exam: General:  No acute distress  Head:  Normocephalic, atraumatic.    Eyes:  Anicteric  Neck:  Tracheostomy in place  Lungs:   Scattered rhonchi, normal effort  Heart:  S1S2 no rubs  Abdomen:   Soft, nontender, bowel sounds present  Extremities:  2+ bilateral upper extremity edema  Neurologic:  Bilateral hand contractures, not following commands  Skin:  No acute skin rash  Access:  Left upper extremity AV fistula    Basic Metabolic Panel: Recent Labs  Lab 12/14/20 0832 12/16/20 1031 12/18/20 0640  NA 133* 133* 130*  K 4.1 3.3* 4.1  CL 95* 98 94*  CO2 28 27 26   GLUCOSE 110* 100* 174*  BUN 62* 24* 55*  CREATININE 2.51* 1.22 2.22*  CALCIUM 9.1 8.9 8.9  PHOS 2.2* 1.2* 2.5     Liver Function Tests: Recent Labs  Lab 12/14/20 0832 12/16/20 1031 12/18/20 0640  ALBUMIN 1.6* 1.7* 1.6*    No results for input(s): LIPASE, AMYLASE in the last 168 hours. No results for input(s): AMMONIA in the last 168 hours.  CBC: Recent Labs  Lab 12/14/20 0832 12/16/20 1031 12/18/20 0640  WBC 10.0 8.4 11.0*  HGB 7.7* 7.9* 7.2*  HCT 25.1* 25.7* 24.1*  MCV 79.7* 80.1 80.9  PLT 238 204 169     Cardiac Enzymes: No results for input(s): CKTOTAL, CKMB, CKMBINDEX, TROPONINI in the last 168 hours.  BNP: Invalid input(s): POCBNP  CBG: No results for input(s): GLUCAP in the last 168 hours.  Microbiology: Results for orders placed or performed during the hospital encounter of 07/17/20  Culture, blood (routine x 2)     Status: None   Collection Time: 07/27/20 11:17 AM   Specimen: BLOOD RIGHT ARM  Result Value Ref Range Status   Specimen Description BLOOD RIGHT ARM  Final   Special Requests   Final    BOTTLES DRAWN AEROBIC  ONLY Blood Culture results may not be optimal due to an inadequate volume of blood received in culture bottles   Culture   Final    NO GROWTH 5 DAYS Performed at [facility], [place] [address] [place], [place] [address]    Report Status 08/01/2020 FINAL  Final  Culture, blood (routine x 2)     Status: None   Collection Time: 07/27/20 11:23 AM   Specimen: BLOOD RIGHT HAND  Result Value Ref Range Status   Specimen Description BLOOD RIGHT HAND  Final   Special Requests   Final    BOTTLES DRAWN AEROBIC ONLY Blood Culture results may not be optimal due to an inadequate volume of blood received in culture bottles   Culture   Final    NO GROWTH 5 DAYS Performed at [facility], [place] [address] [place], [place] [address]    Report Status 08/01/2020 FINAL  Final  Culture, blood (routine x 2)     Status: None   Collection Time: 08/01/20  2:25 PM   Specimen: BLOOD RIGHT HAND  Result Value Ref Range Status   Specimen Description BLOOD RIGHT HAND  Final   Special Requests   Final    BOTTLES DRAWN AEROBIC AND ANAEROBIC Blood Culture results may not be optimal due to an inadequate volume of  blood received in culture bottles   Culture   Final    NO GROWTH 5 DAYS Performed at [facility], [place] [address], [place], [place] [address]    Report Status 08/06/2020 FINAL  Final  Culture, blood (routine x 2)     Status: None   Collection Time: 08/01/20  2:39 PM   Specimen: BLOOD RIGHT ARM  Result Value Ref Range Status   Specimen Description BLOOD RIGHT ARM  Final   Special Requests   Final    BOTTLES DRAWN AEROBIC ONLY Blood Culture results may not be optimal due to an inadequate volume of blood received in culture bottles   Culture   Final    NO GROWTH 5 DAYS Performed at [facility], [place] [address], [place], [place] [address]    Report Status 08/06/2020 FINAL  Final  Culture, Respiratory w Gram Stain     Status: None   Collection Time: 08/01/20  2:55 PM   Specimen:  Tracheal Aspirate; Respiratory  Result Value Ref Range Status   Specimen Description TRACHEAL ASPIRATE  Final   Special Requests NONE  Final   Gram Stain   Final    FEW WBC PRESENT, PREDOMINANTLY PMN MODERATE GRAM NEGATIVE RODS Performed at [facility], [place] [address] [place], [place] [address]    Culture ABUNDANT PSEUDOMONAS AERUGINOSA  Final   Report Status 08/04/2020 FINAL  Final   Organism ID, Bacteria PSEUDOMONAS AERUGINOSA  Final      Susceptibility   Pseudomonas aeruginosa - MIC*    CEFTAZIDIME <=1 SENSITIVE Sensitive     CIPROFLOXACIN <=0.25 SENSITIVE Sensitive     GENTAMICIN 8 INTERMEDIATE Intermediate     IMIPENEM >=16 RESISTANT Resistant     PIP/TAZO <=4 SENSITIVE Sensitive     CEFEPIME 2 SENSITIVE Sensitive     * ABUNDANT PSEUDOMONAS AERUGINOSA  Culture, Respiratory w Gram Stain     Status: None   Collection Time: 09/17/20  5:21 PM   Specimen: Tracheal Aspirate; Respiratory  Result Value Ref Range Status   Specimen Description TRACHEAL ASPIRATE  Final   Special Requests NONE  Final   Gram Stain   Final    ABUNDANT WBC PRESENT,BOTH PMN AND MONONUCLEAR MODERATE SQUAMOUS EPITHELIAL CELLS PRESENT MODERATE GRAM POSITIVE COCCI MODERATE GRAM NEGATIVE RODS Performed at [facility], [place] [address] [place], [place] [address]    Culture FEW PSEUDOMONAS AERUGINOSA  Final   Report Status 09/22/2020 FINAL  Final   Organism ID, Bacteria PSEUDOMONAS AERUGINOSA  Final      Susceptibility   Pseudomonas aeruginosa - MIC*    CEFTAZIDIME 2 SENSITIVE Sensitive     CIPROFLOXACIN 1 SENSITIVE Sensitive     GENTAMICIN >=16 RESISTANT Resistant     IMIPENEM >=16 RESISTANT Resistant     PIP/TAZO <=4 SENSITIVE Sensitive     * FEW PSEUDOMONAS AERUGINOSA  Culture, blood (routine x 2)     Status: None   Collection Time: 09/18/20 12:05 PM   Specimen: BLOOD RIGHT ARM  Result Value Ref Range Status   Specimen Description BLOOD RIGHT ARM  Final   Special Requests   Final     BOTTLES DRAWN AEROBIC AND ANAEROBIC Blood Culture adequate volume   Culture   Final    NO GROWTH 5 DAYS Performed at [facility], [address] [place], [place] [address]    Report Status 09/23/2020 FINAL  Final  Culture, blood (routine x 2)  Status: None   Collection Time: 09/18/20 12:05 PM   Specimen: BLOOD RIGHT HAND  Result Value Ref Range Status   Specimen Description BLOOD RIGHT HAND  Final   Special Requests   Final    BOTTLES DRAWN AEROBIC AND ANAEROBIC Blood Culture adequate volume   Culture   Final    NO GROWTH 5 DAYS Performed at [facility], [address] [place], [place] [address]    Report Status 09/23/2020 FINAL  Final  Culture, Respiratory w Gram Stain     Status: None   Collection Time: 09/29/20 12:06 PM   Specimen: Tracheal Aspirate; Respiratory  Result Value Ref Range Status   Specimen Description TRACHEAL ASPIRATE  Final   Special Requests NONE  Final   Gram Stain   Final    RARE SQUAMOUS EPITHELIAL CELLS PRESENT ABUNDANT WBC PRESENT,BOTH PMN AND MONONUCLEAR ABUNDANT GRAM NEGATIVE RODS    Culture   Final    FEW PSEUDOMONAS AERUGINOSA MULTI-DRUG RESISTANT ORGANISM CRITICAL RESULT CALLED TO, READ BACK BY AND VERIFIED WITH: [place] RN @1145  10/03/20 EB Performed at [facility], [place] [address], [place], [place] [address]    Report Status 10/03/2020 FINAL  Final   Organism ID, Bacteria PSEUDOMONAS AERUGINOSA  Final      Susceptibility   Pseudomonas aeruginosa - MIC*    CEFTAZIDIME 16 INTERMEDIATE Intermediate     CIPROFLOXACIN 0.5 SENSITIVE Sensitive     GENTAMICIN >=16 RESISTANT Resistant     IMIPENEM >=16 RESISTANT Resistant     * FEW PSEUDOMONAS AERUGINOSA  Culture, blood (routine x 2)     Status: Abnormal   Collection Time: 09/29/20  1:07 PM   Specimen: BLOOD RIGHT ARM  Result Value Ref Range Status   Specimen Description BLOOD RIGHT ARM  Final   Special Requests   Final    BOTTLES DRAWN AEROBIC AND ANAEROBIC Blood  Culture adequate volume   Culture  Setup Time   Final    GRAM POSITIVE COCCI IN CHAINS AEROBIC BOTTLE ONLY CRITICAL RESULT CALLED TO, READ BACK BY AND VERIFIED WITH: RN S.SNED AT 1233 ON 09/30/2020 BY T.SAAD. Performed at [facility], [place] [address] [place], [place] [address]    Culture VANCOMYCIN RESISTANT ENTEROCOCCUS (A)  Final   Report Status 10/02/2020 FINAL  Final   Organism ID, Bacteria VANCOMYCIN RESISTANT ENTEROCOCCUS  Final      Susceptibility   Vancomycin resistant enterococcus - MIC*    AMPICILLIN <=2 SENSITIVE Sensitive     VANCOMYCIN >=32 RESISTANT Resistant     GENTAMICIN SYNERGY SENSITIVE Sensitive     LINEZOLID 2 SENSITIVE Sensitive     * VANCOMYCIN RESISTANT ENTEROCOCCUS  Blood Culture ID Panel (Reflexed)     Status: Abnormal   Collection Time: 09/29/20  1:07 PM  Result Value Ref Range Status   Enterococcus faecalis DETECTED (A) NOT DETECTED Final    Comment: CRITICAL RESULT CALLED TO, READ BACK BY AND VERIFIED WITH: RN S.SNED AT 1233 ON 09/30/2020 BY T.SAAD.    Enterococcus Faecium NOT DETECTED NOT DETECTED Final   Listeria monocytogenes NOT DETECTED NOT DETECTED Final   Staphylococcus species NOT DETECTED NOT DETECTED Final   Staphylococcus aureus (BCID) NOT DETECTED NOT DETECTED Final   Staphylococcus epidermidis NOT DETECTED NOT DETECTED Final   Staphylococcus lugdunensis NOT DETECTED NOT DETECTED Final   Streptococcus species NOT DETECTED NOT DETECTED Final   Streptococcus agalactiae NOT DETECTED NOT DETECTED Final   Streptococcus pneumoniae NOT DETECTED NOT DETECTED Final  Streptococcus pyogenes NOT DETECTED NOT DETECTED Final   A.calcoaceticus-baumannii NOT DETECTED NOT DETECTED Final   Bacteroides fragilis NOT DETECTED NOT DETECTED Final   Enterobacterales NOT DETECTED NOT DETECTED Final   Enterobacter cloacae complex NOT DETECTED NOT DETECTED Final   Escherichia coli NOT DETECTED NOT DETECTED Final   Klebsiella aerogenes NOT DETECTED NOT  DETECTED Final   Klebsiella oxytoca NOT DETECTED NOT DETECTED Final   Klebsiella pneumoniae NOT DETECTED NOT DETECTED Final   Proteus species NOT DETECTED NOT DETECTED Final   Salmonella species NOT DETECTED NOT DETECTED Final   Serratia marcescens NOT DETECTED NOT DETECTED Final   Haemophilus influenzae NOT DETECTED NOT DETECTED Final   Neisseria meningitidis NOT DETECTED NOT DETECTED Final   Pseudomonas aeruginosa NOT DETECTED NOT DETECTED Final   Stenotrophomonas maltophilia NOT DETECTED NOT DETECTED Final   Candida albicans NOT DETECTED NOT DETECTED Final   Candida auris NOT DETECTED NOT DETECTED Final   Candida glabrata NOT DETECTED NOT DETECTED Final   Candida krusei NOT DETECTED NOT DETECTED Final   Candida parapsilosis NOT DETECTED NOT DETECTED Final   Candida tropicalis NOT DETECTED NOT DETECTED Final   Cryptococcus neoformans/gattii NOT DETECTED NOT DETECTED Final   Vancomycin resistance DETECTED (A) NOT DETECTED Final    Comment: CRITICAL RESULT CALLED TO, READ BACK BY AND VERIFIED WITH: RN S.SNED AT 1233 ON 09/30/2020 BY T.SAAD. Performed at [facility], [place] [address], [place], [place] [address]   Culture, blood (routine x 2)     Status: None   Collection Time: 09/29/20  1:14 PM   Specimen: BLOOD RIGHT ARM  Result Value Ref Range Status   Specimen Description BLOOD RIGHT ARM  Final   Special Requests   Final    BOTTLES DRAWN AEROBIC AND ANAEROBIC Blood Culture adequate volume   Culture   Final    NO GROWTH 5 DAYS Performed at [facility], [place] [address], [place], [place] [address]    Report Status 10/04/2020 FINAL  Final  Culture, blood (routine x 2)     Status: None   Collection Time: 10/20/20  9:57 AM   Specimen: BLOOD RIGHT HAND  Result Value Ref Range Status   Specimen Description BLOOD RIGHT HAND  Final   Special Requests   Final    BOTTLES DRAWN AEROBIC ONLY Blood Culture adequate volume   Culture   Final    NO GROWTH 5 DAYS Performed at  [facility], [place] [address], [place], [place] [address]    Report Status 10/25/2020 FINAL  Final  Culture, blood (routine x 2)     Status: None   Collection Time: 10/20/20  9:57 AM   Specimen: BLOOD RIGHT HAND  Result Value Ref Range Status   Specimen Description BLOOD RIGHT HAND  Final   Special Requests   Final    BOTTLES DRAWN AEROBIC ONLY Blood Culture adequate volume   Culture   Final    NO GROWTH 5 DAYS Performed at [facility], [place] [address], [place], [place] [address]    Report Status 10/25/2020 FINAL  Final  Culture, blood (routine x 2)     Status: None   Collection Time: 11/27/20  9:33 PM   Specimen: BLOOD RIGHT HAND  Result Value Ref Range Status   Specimen Description BLOOD RIGHT HAND  Final   Special Requests AEROBIC BOTTLE ONLY Blood Culture adequate volume  Final   Culture   Final    NO GROWTH 5  DAYS Performed at [facility], [place] [address], [place], [place] [address]    Report Status 12/02/2020 FINAL  Final  Culture, blood (routine x 2)     Status: None   Collection Time: 11/27/20  9:33 PM   Specimen: BLOOD  Result Value Ref Range Status   Specimen Description BLOOD RIGHT ANTECUBITAL  Final   Special Requests   Final    BOTTLES DRAWN AEROBIC AND ANAEROBIC Blood Culture adequate volume   Culture   Final    NO GROWTH 5 DAYS Performed at [facility], [place] [address] [place], [place] [address]    Report Status 12/02/2020 FINAL  Final  Culture, Respiratory w Gram Stain     Status: None   Collection Time: 12/02/20  3:29 PM   Specimen: Tracheal Aspirate; Respiratory  Result Value Ref Range Status   Specimen Description TRACHEAL ASPIRATE  Final   Special Requests NONE  Final   Gram Stain   Final    FEW SQUAMOUS EPITHELIAL CELLS PRESENT FEW WBC SEEN FEW GRAM POSITIVE COCCI MODERATE GRAM NEGATIVE RODS Performed at [facility], [place] [address] [place], [place] [address]    Culture ABUNDANT PSEUDOMONAS AERUGINOSA  Final    Report Status 12/06/2020 FINAL  Final   Organism ID, Bacteria PSEUDOMONAS AERUGINOSA  Final      Susceptibility   Pseudomonas aeruginosa - MIC*    CEFTAZIDIME 2 SENSITIVE Sensitive     CIPROFLOXACIN 2 RESISTANT Resistant     GENTAMICIN >=16 RESISTANT Resistant     IMIPENEM >=16 RESISTANT Resistant     PIP/TAZO <=4 SENSITIVE Sensitive     * ABUNDANT PSEUDOMONAS AERUGINOSA    Coagulation Studies: No results for input(s): LABPROT, INR in the last 72 hours.   Urinalysis: No results for input(s): COLORURINE, LABSPEC, PHURINE, GLUCOSEU, HGBUR, BILIRUBINUR, KETONESUR, PROTEINUR, UROBILINOGEN, NITRITE, LEUKOCYTESUR in the last 72 hours.  Invalid input(s): APPERANCEUR    Imaging: No results found.   Medications:       Assessment/ Plan:  45 y.o. male with a PMHx of ESRD on HD, posterior reversible encephalopathy syndrome, diabetes mellitus type 2, chronic systolic heart failure, anemia of chronic kidney disease, secondary hyperparathyroidism, malignant hypertension, acute respiratory failure, who was admitted to [facility] on 06/29/2020 for ongoing treatment of acute respiratory failure, posterior reversible encephalopathy syndrome, and end-stage renal disease.   1.  ESRD on HD.  Patient continues to tolerate dialysis treatments well.  He is due for hemodialysis treatment today.  2.  Anemia of chronic kidney disease.  Hemoglobin is drifted down further today and currently 7.2.  Consider blood transfusion for hemoglobin of 7 or or less.  3.  Secondary hyperparathyroidism.  Phosphorus currently 2.5 and acceptable.  4.  Acute respiratory failure.  Tracheostomy placed 07/17/2020.  Patient continues to be on T-piece collar.  Pseudomonas growing in respiratory culture.  Respiratory status appears to be stable.  5.  Hypertension.  As before blood pressure remains labile and currently 188/99.  Continue current antihypertensive treatment regimen.   Jeffery Andrews 12/16/20228:52  AM

## 2020-12-19 ENCOUNTER — Other Ambulatory Visit (HOSPITAL_COMMUNITY): Payer: Medicare Other

## 2020-12-19 LAB — CBC
HCT: 26.6 % — ABNORMAL LOW (ref 39.0–52.0)
Hemoglobin: 8.3 g/dL — ABNORMAL LOW (ref 13.0–17.0)
MCH: 25 pg — ABNORMAL LOW (ref 26.0–34.0)
MCHC: 31.2 g/dL (ref 30.0–36.0)
MCV: 80.1 fL (ref 80.0–100.0)
Platelets: 206 10*3/uL (ref 150–400)
RBC: 3.32 MIL/uL — ABNORMAL LOW (ref 4.22–5.81)
RDW: 16.6 % — ABNORMAL HIGH (ref 11.5–15.5)
WBC: 17.1 10*3/uL — ABNORMAL HIGH (ref 4.0–10.5)
nRBC: 0 % (ref 0.0–0.2)

## 2020-12-21 LAB — RENAL FUNCTION PANEL
Albumin: <1.5 g/dL — ABNORMAL LOW (ref 3.5–5.0)
Anion gap: 8 (ref 5–15)
BUN: 37 mg/dL — ABNORMAL HIGH (ref 6–20)
CO2: 29 mmol/L (ref 22–32)
Calcium: 8.5 mg/dL — ABNORMAL LOW (ref 8.9–10.3)
Chloride: 95 mmol/L — ABNORMAL LOW (ref 98–111)
Creatinine, Ser: 1.64 mg/dL — ABNORMAL HIGH (ref 0.61–1.24)
GFR, Estimated: 52 mL/min — ABNORMAL LOW (ref >60)
Glucose, Bld: 127 mg/dL — ABNORMAL HIGH (ref 70–99)
Phosphorus: 1.7 mg/dL — ABNORMAL LOW (ref 2.5–4.6)
Potassium: 3.3 mmol/L — ABNORMAL LOW (ref 3.5–5.1)
Sodium: 132 mmol/L — ABNORMAL LOW (ref 135–145)

## 2020-12-21 LAB — CBC
HCT: 21.8 % — ABNORMAL LOW (ref 39.0–52.0)
Hemoglobin: 6.9 g/dL — CL (ref 13.0–17.0)
MCH: 25 pg — ABNORMAL LOW (ref 26.0–34.0)
MCHC: 31.7 g/dL (ref 30.0–36.0)
MCV: 79 fL — ABNORMAL LOW (ref 80.0–100.0)
Platelets: 174 10*3/uL (ref 150–400)
RBC: 2.76 MIL/uL — ABNORMAL LOW (ref 4.22–5.81)
RDW: 16 % — ABNORMAL HIGH (ref 11.5–15.5)
WBC: 14.7 10*3/uL — ABNORMAL HIGH (ref 4.0–10.5)
nRBC: 0 % (ref 0.0–0.2)

## 2020-12-21 NOTE — Progress Notes (Signed)
[facility]  ROUNDING NOTE   Subjective:  Patient laying in bed this AM. Appears to be comfortable. Due for hemodialysis treatment today.   Objective:  Vital signs in last 24 hours:  Temperature 98.7 pulse 65 respiration 16 blood pressure 190/96  Physical Exam: General:  No acute distress  Head:  Normocephalic, atraumatic.    Eyes:  Anicteric  Neck:  Tracheostomy in place  Lungs:   Scattered rhonchi, normal effort  Heart:  S1S2 no rubs  Abdomen:   Soft, nontender, bowel sounds present  Extremities:  2+ bilateral upper extremity edema  Neurologic:  Bilateral hand contractures, not following commands  Skin:  No acute skin rash  Access:  Left upper extremity AV fistula    Basic Metabolic Panel: Recent Labs  Lab 12/16/20 1031 12/18/20 0640  NA 133* 130*  K 3.3* 4.1  CL 98 94*  CO2 27 26  GLUCOSE 100* 174*  BUN 24* 55*  CREATININE 1.22 2.22*  CALCIUM 8.9 8.9  PHOS 1.2* 2.5     Liver Function Tests: Recent Labs  Lab 12/16/20 1031 12/18/20 0640  ALBUMIN 1.7* 1.6*    No results for input(s): LIPASE, AMYLASE in the last 168 hours. No results for input(s): AMMONIA in the last 168 hours.  CBC: Recent Labs  Lab 12/16/20 1031 12/18/20 0640 12/19/20 0609  WBC 8.4 11.0* 17.1*  HGB 7.9* 7.2* 8.3*  HCT 25.7* 24.1* 26.6*  MCV 80.1 80.9 80.1  PLT 204 169 206     Cardiac Enzymes: No results for input(s): CKTOTAL, CKMB, CKMBINDEX, TROPONINI in the last 168 hours.  BNP: Invalid input(s): POCBNP  CBG: No results for input(s): GLUCAP in the last 168 hours.  Microbiology: Results for orders placed or performed during the hospital encounter of 07/17/20  Culture, blood (routine x 2)     Status: None   Collection Time: 07/27/20 11:17 AM   Specimen: BLOOD RIGHT ARM  Result Value Ref Range Status   Specimen Description BLOOD RIGHT ARM  Final   Special Requests   Final    BOTTLES DRAWN AEROBIC ONLY Blood Culture results may not be optimal due to an  inadequate volume of blood received in culture bottles   Culture   Final    NO GROWTH 5 DAYS Performed at [facility], [place] [address] [place], [place] [address]    Report Status 08/01/2020 FINAL  Final  Culture, blood (routine x 2)     Status: None   Collection Time: 07/27/20 11:23 AM   Specimen: BLOOD RIGHT HAND  Result Value Ref Range Status   Specimen Description BLOOD RIGHT HAND  Final   Special Requests   Final    BOTTLES DRAWN AEROBIC ONLY Blood Culture results may not be optimal due to an inadequate volume of blood received in culture bottles   Culture   Final    NO GROWTH 5 DAYS Performed at [facility], [place] [address] [place],  [address]    Report Status 08/01/2020 FINAL  Final  Culture, blood (routine x 2)     Status: None   Collection Time: 08/01/20  2:25 PM   Specimen: BLOOD RIGHT HAND  Result Value Ref Range Status   Specimen Description BLOOD RIGHT HAND  Final   Special Requests   Final    BOTTLES DRAWN AEROBIC AND ANAEROBIC Blood Culture results may not be optimal due to an inadequate volume of blood received in culture bottles   Culture   Final  NO GROWTH 5 DAYS Performed at [facility], [place] [address], [place], [place] [address]    Report Status 08/06/2020 FINAL  Final  Culture, blood (routine x 2)     Status: None   Collection Time: 08/01/20  2:39 PM   Specimen: BLOOD RIGHT ARM  Result Value Ref Range Status   Specimen Description BLOOD RIGHT ARM  Final   Special Requests   Final    BOTTLES DRAWN AEROBIC ONLY Blood Culture results may not be optimal due to an inadequate volume of blood received in culture bottles   Culture   Final    NO GROWTH 5 DAYS Performed at [facility], [place] [address], [place], [place] [address]    Report Status 08/06/2020 FINAL  Final  Culture, Respiratory w Gram Stain     Status: None   Collection Time: 08/01/20  2:55 PM   Specimen: Tracheal Aspirate; Respiratory  Result Value Ref Range  Status   Specimen Description TRACHEAL ASPIRATE  Final   Special Requests NONE  Final   Gram Stain   Final    FEW WBC PRESENT, PREDOMINANTLY PMN MODERATE GRAM NEGATIVE RODS Performed at [facility], [place] [address], [place], [place] [address]    Culture ABUNDANT PSEUDOMONAS AERUGINOSA  Final   Report Status 08/04/2020 FINAL  Final   Organism ID, Bacteria PSEUDOMONAS AERUGINOSA  Final      Susceptibility   Pseudomonas aeruginosa - MIC*    CEFTAZIDIME <=1 SENSITIVE Sensitive     CIPROFLOXACIN <=0.25 SENSITIVE Sensitive     GENTAMICIN 8 INTERMEDIATE Intermediate     IMIPENEM >=16 RESISTANT Resistant     PIP/TAZO <=4 SENSITIVE Sensitive     CEFEPIME 2 SENSITIVE Sensitive     * ABUNDANT PSEUDOMONAS AERUGINOSA  Culture, Respiratory w Gram Stain     Status: None   Collection Time: 09/17/20  5:21 PM   Specimen: Tracheal Aspirate; Respiratory  Result Value Ref Range Status   Specimen Description TRACHEAL ASPIRATE  Final   Special Requests NONE  Final   Gram Stain   Final    ABUNDANT WBC PRESENT,BOTH PMN AND MONONUCLEAR MODERATE SQUAMOUS EPITHELIAL CELLS PRESENT MODERATE GRAM POSITIVE COCCI MODERATE GRAM NEGATIVE RODS Performed at [facility], [place] [address] [place], [place] [address]    Culture FEW PSEUDOMONAS AERUGINOSA  Final   Report Status 09/22/2020 FINAL  Final   Organism ID, Bacteria PSEUDOMONAS AERUGINOSA  Final      Susceptibility   Pseudomonas aeruginosa - MIC*    CEFTAZIDIME 2 SENSITIVE Sensitive     CIPROFLOXACIN 1 SENSITIVE Sensitive     GENTAMICIN >=16 RESISTANT Resistant     IMIPENEM >=16 RESISTANT Resistant     PIP/TAZO <=4 SENSITIVE Sensitive     * FEW PSEUDOMONAS AERUGINOSA  Culture, blood (routine x 2)     Status: None   Collection Time: 09/18/20 12:05 PM   Specimen: BLOOD RIGHT ARM  Result Value Ref Range Status   Specimen Description BLOOD RIGHT ARM  Final   Special Requests   Final    BOTTLES DRAWN AEROBIC AND ANAEROBIC Blood Culture  adequate volume   Culture   Final    NO GROWTH 5 DAYS Performed at [facility], [address], [place],  [address]    Report Status 09/23/2020 FINAL  Final  Culture, blood (routine x 2)     Status: None   Collection Time: 09/18/20 12:05 PM   Specimen: BLOOD RIGHT  HAND  Result Value Ref Range Status   Specimen Description BLOOD RIGHT HAND  Final   Special Requests   Final    BOTTLES DRAWN AEROBIC AND ANAEROBIC Blood Culture adequate volume   Culture   Final    NO GROWTH 5 DAYS Performed at [facility], [address], [place], [place] [address]    Report Status 09/23/2020 FINAL  Final  Culture, Respiratory w Gram Stain     Status: None   Collection Time: 09/29/20 12:06 PM   Specimen: Tracheal Aspirate; Respiratory  Result Value Ref Range Status   Specimen Description TRACHEAL ASPIRATE  Final   Special Requests NONE  Final   Gram Stain   Final    RARE SQUAMOUS EPITHELIAL CELLS PRESENT ABUNDANT WBC PRESENT,BOTH PMN AND MONONUCLEAR ABUNDANT GRAM NEGATIVE RODS    Culture   Final    FEW PSEUDOMONAS AERUGINOSA MULTI-DRUG RESISTANT ORGANISM CRITICAL RESULT CALLED TO, READ BACK BY AND VERIFIED WITH: [place] RN @1145  10/03/20 EB Performed at [facility], [place] [address], [place], [place] [address]    Report Status 10/03/2020 FINAL  Final   Organism ID, Bacteria PSEUDOMONAS AERUGINOSA  Final      Susceptibility   Pseudomonas aeruginosa - MIC*    CEFTAZIDIME 16 INTERMEDIATE Intermediate     CIPROFLOXACIN 0.5 SENSITIVE Sensitive     GENTAMICIN >=16 RESISTANT Resistant     IMIPENEM >=16 RESISTANT Resistant     * FEW PSEUDOMONAS AERUGINOSA  Culture, blood (routine x 2)     Status: Abnormal   Collection Time: 09/29/20  1:07 PM   Specimen: BLOOD RIGHT ARM  Result Value Ref Range Status   Specimen Description BLOOD RIGHT ARM  Final   Special Requests   Final    BOTTLES DRAWN AEROBIC AND ANAEROBIC Blood Culture adequate volume   Culture  Setup Time   Final     GRAM POSITIVE COCCI IN CHAINS AEROBIC BOTTLE ONLY CRITICAL RESULT CALLED TO, READ BACK BY AND VERIFIED WITH: RN S.SNED AT 1233 ON 09/30/2020 BY T.SAAD. Performed at [facility], [place] [address] [place],  [address]    Culture VANCOMYCIN RESISTANT ENTEROCOCCUS (A)  Final   Report Status 10/02/2020 FINAL  Final   Organism ID, Bacteria VANCOMYCIN RESISTANT ENTEROCOCCUS  Final      Susceptibility   Vancomycin resistant enterococcus - MIC*    AMPICILLIN <=2 SENSITIVE Sensitive     VANCOMYCIN >=32 RESISTANT Resistant     GENTAMICIN SYNERGY SENSITIVE Sensitive     LINEZOLID 2 SENSITIVE Sensitive     * VANCOMYCIN RESISTANT ENTEROCOCCUS  Blood Culture ID Panel (Reflexed)     Status: Abnormal   Collection Time: 09/29/20  1:07 PM  Result Value Ref Range Status   Enterococcus faecalis DETECTED (A) NOT DETECTED Final    Comment: CRITICAL RESULT CALLED TO, READ BACK BY AND VERIFIED WITH: RN S.SNED AT 1233 ON 09/30/2020 BY T.SAAD.    Enterococcus Faecium NOT DETECTED NOT DETECTED Final   Listeria monocytogenes NOT DETECTED NOT DETECTED Final   Staphylococcus species NOT DETECTED NOT DETECTED Final   Staphylococcus aureus (BCID) NOT DETECTED NOT DETECTED Final   Staphylococcus epidermidis NOT DETECTED NOT DETECTED Final   Staphylococcus lugdunensis NOT DETECTED NOT DETECTED Final   Streptococcus species NOT DETECTED NOT DETECTED Final   Streptococcus agalactiae NOT DETECTED NOT DETECTED Final   Streptococcus pneumoniae NOT DETECTED NOT DETECTED Final   Streptococcus pyogenes NOT DETECTED NOT DETECTED Final   A.calcoaceticus-baumannii NOT DETECTED  NOT DETECTED Final   Bacteroides fragilis NOT DETECTED NOT DETECTED Final   Enterobacterales NOT DETECTED NOT DETECTED Final   Enterobacter cloacae complex NOT DETECTED NOT DETECTED Final   Escherichia coli NOT DETECTED NOT DETECTED Final   Klebsiella aerogenes NOT DETECTED NOT DETECTED Final   Klebsiella oxytoca NOT DETECTED NOT  DETECTED Final   Klebsiella pneumoniae NOT DETECTED NOT DETECTED Final   Proteus species NOT DETECTED NOT DETECTED Final   Salmonella species NOT DETECTED NOT DETECTED Final   Serratia marcescens NOT DETECTED NOT DETECTED Final   Haemophilus influenzae NOT DETECTED NOT DETECTED Final   Neisseria meningitidis NOT DETECTED NOT DETECTED Final   Pseudomonas aeruginosa NOT DETECTED NOT DETECTED Final   Stenotrophomonas maltophilia NOT DETECTED NOT DETECTED Final   Candida albicans NOT DETECTED NOT DETECTED Final   Candida auris NOT DETECTED NOT DETECTED Final   Candida glabrata NOT DETECTED NOT DETECTED Final   Candida krusei NOT DETECTED NOT DETECTED Final   Candida parapsilosis NOT DETECTED NOT DETECTED Final   Candida tropicalis NOT DETECTED NOT DETECTED Final   Cryptococcus neoformans/gattii NOT DETECTED NOT DETECTED Final   Vancomycin resistance DETECTED (A) NOT DETECTED Final    Comment: CRITICAL RESULT CALLED TO, READ BACK BY AND VERIFIED WITH: RN S.SNED AT 1233 ON 09/30/2020 BY T.SAAD. Performed at [facility], [place] [address] [place], [place] [address]   Culture, blood (routine x 2)     Status: None   Collection Time: 09/29/20  1:14 PM   Specimen: BLOOD RIGHT ARM  Result Value Ref Range Status   Specimen Description BLOOD RIGHT ARM  Final   Special Requests   Final    BOTTLES DRAWN AEROBIC AND ANAEROBIC Blood Culture adequate volume   Culture   Final    NO GROWTH 5 DAYS Performed at [facility], [place] [address], [place], [place] [address]    Report Status 10/04/2020 FINAL  Final  Culture, blood (routine x 2)     Status: None   Collection Time: 10/20/20  9:57 AM   Specimen: BLOOD RIGHT HAND  Result Value Ref Range Status   Specimen Description BLOOD RIGHT HAND  Final   Special Requests   Final    BOTTLES DRAWN AEROBIC ONLY Blood Culture adequate volume   Culture   Final    NO GROWTH 5 DAYS Performed at [facility], [place] [address] [place], [place]  [address]    Report Status 10/25/2020 FINAL  Final  Culture, blood (routine x 2)     Status: None   Collection Time: 10/20/20  9:57 AM   Specimen: BLOOD RIGHT HAND  Result Value Ref Range Status   Specimen Description BLOOD RIGHT HAND  Final   Special Requests   Final    BOTTLES DRAWN AEROBIC ONLY Blood Culture adequate volume   Culture   Final    NO GROWTH 5 DAYS Performed at [facility], [place] [address], [place], [place] [address]    Report Status 10/25/2020 FINAL  Final  Culture, blood (routine x 2)     Status: None   Collection Time: 11/27/20  9:33 PM   Specimen: BLOOD RIGHT HAND  Result Value Ref Range Status   Specimen Description BLOOD RIGHT HAND  Final   Special Requests AEROBIC BOTTLE ONLY Blood Culture adequate volume  Final   Culture   Final    NO GROWTH 5 DAYS Performed at [facility], [place],  [place] [address]    Report Status 12/02/2020 FINAL  Final  Culture, blood (routine x 2)     Status: None   Collection Time: 11/27/20  9:33 PM   Specimen: BLOOD  Result Value Ref Range Status   Specimen Description BLOOD RIGHT ANTECUBITAL  Final   Special Requests   Final    BOTTLES DRAWN AEROBIC AND ANAEROBIC Blood Culture adequate volume   Culture   Final    NO GROWTH 5 DAYS Performed at [facility], [address] [place], [place] [address]    Report Status 12/02/2020 FINAL  Final  Culture, Respiratory w Gram Stain     Status: None   Collection Time: 12/02/20  3:29 PM   Specimen: Tracheal Aspirate; Respiratory  Result Value Ref Range Status   Specimen Description TRACHEAL ASPIRATE  Final   Special Requests NONE  Final   Gram Stain   Final    FEW SQUAMOUS EPITHELIAL CELLS PRESENT FEW WBC SEEN FEW GRAM POSITIVE COCCI MODERATE GRAM NEGATIVE RODS Performed at [facility], [place] [address] [place], [place] [address]    Culture ABUNDANT PSEUDOMONAS AERUGINOSA  Final   Report Status 12/06/2020 FINAL  Final   Organism ID,  Bacteria PSEUDOMONAS AERUGINOSA  Final      Susceptibility   Pseudomonas aeruginosa - MIC*    CEFTAZIDIME 2 SENSITIVE Sensitive     CIPROFLOXACIN 2 RESISTANT Resistant     GENTAMICIN >=16 RESISTANT Resistant     IMIPENEM >=16 RESISTANT Resistant     PIP/TAZO <=4 SENSITIVE Sensitive     * ABUNDANT PSEUDOMONAS AERUGINOSA    Coagulation Studies: No results for input(s): LABPROT, INR in the last 72 hours.   Urinalysis: No results for input(s): COLORURINE, LABSPEC, PHURINE, GLUCOSEU, HGBUR, BILIRUBINUR, KETONESUR, PROTEINUR, UROBILINOGEN, NITRITE, LEUKOCYTESUR in the last 72 hours.  Invalid input(s): APPERANCEUR    Imaging: No results found.   Medications:       Assessment/ Plan:  45 y.o. male with a PMHx of ESRD on HD, posterior reversible encephalopathy syndrome, diabetes mellitus type 2, chronic systolic heart failure, anemia of chronic kidney disease, secondary hyperparathyroidism, malignant hypertension, acute respiratory failure, who was admitted to [facility] on 06/29/2020 for ongoing treatment of acute respiratory failure, posterior reversible encephalopathy syndrome, and end-stage renal disease.   1.  ESRD on HD.  Patient due for hemodialysis treatment today.  He appears to be a ward of the state at this time now.  Placement at facility that can manage tracheostomy, PEG tube, and dialysis will need to be found.  2.  Anemia of chronic kidney disease.  Hemoglobin up to 8.3.  Maintain the patient on Retacrit 6070 with dialysis.  3.  Secondary hyperparathyroidism.  Phosphorus currently 2.5 and acceptable.  4.  Acute respiratory failure.  Tracheostomy placed 07/17/2020.  Patient continues to be on T-piece collar.  Pseudomonas grew in respiratory culture.  Respiratory status appears to be stable.  5.  Hypertension.  Blood pressure elevated this a.m. at 190/96.  He remains on multiple antihypertensives including amlodipine, clonidine oral and patch, hydralazine, labetalol,  losartan, and terazosin.   Jeffery Andrews 12/19/20229:23 AM

## 2020-12-22 LAB — PREPARE RBC (CROSSMATCH)

## 2020-12-22 LAB — HEMOGLOBIN AND HEMATOCRIT, BLOOD
HCT: 24.2 % — ABNORMAL LOW (ref 39.0–52.0)
Hemoglobin: 7.3 g/dL — ABNORMAL LOW (ref 13.0–17.0)

## 2020-12-23 LAB — CBC
HCT: 23.9 % — ABNORMAL LOW (ref 39.0–52.0)
Hemoglobin: 7.2 g/dL — ABNORMAL LOW (ref 13.0–17.0)
MCH: 23.8 pg — ABNORMAL LOW (ref 26.0–34.0)
MCHC: 30.1 g/dL (ref 30.0–36.0)
MCV: 78.9 fL — ABNORMAL LOW (ref 80.0–100.0)
Platelets: 177 10*3/uL (ref 150–400)
RBC: 3.03 MIL/uL — ABNORMAL LOW (ref 4.22–5.81)
RDW: 15.7 % — ABNORMAL HIGH (ref 11.5–15.5)
WBC: 12 10*3/uL — ABNORMAL HIGH (ref 4.0–10.5)
nRBC: 0.2 % (ref 0.0–0.2)

## 2020-12-23 LAB — RENAL FUNCTION PANEL
Albumin: <1.5 g/dL — ABNORMAL LOW (ref 3.5–5.0)
Anion gap: 9 (ref 5–15)
BUN: 59 mg/dL — ABNORMAL HIGH (ref 6–20)
CO2: 27 mmol/L (ref 22–32)
Calcium: 8.7 mg/dL — ABNORMAL LOW (ref 8.9–10.3)
Chloride: 95 mmol/L — ABNORMAL LOW (ref 98–111)
Creatinine, Ser: 2.35 mg/dL — ABNORMAL HIGH (ref 0.61–1.24)
GFR, Estimated: 34 mL/min — ABNORMAL LOW (ref >60)
Glucose, Bld: 184 mg/dL — ABNORMAL HIGH (ref 70–99)
Phosphorus: 2.7 mg/dL (ref 2.5–4.6)
Potassium: 3.5 mmol/L (ref 3.5–5.1)
Sodium: 131 mmol/L — ABNORMAL LOW (ref 135–145)

## 2020-12-23 LAB — TYPE AND SCREEN
ABO/RH(D): O POS
Antibody Screen: NEGATIVE
Unit division: 0

## 2020-12-23 LAB — BPAM RBC
Blood Product Expiration Date: 202301122359
ISSUE DATE / TIME: 202212200228
Unit Type and Rh: 5100

## 2020-12-23 NOTE — Progress Notes (Signed)
[facility]  ROUNDING NOTE   Subjective:  Patient laying in bed. Had extended back. Patient not verbalizing.   Objective:  Vital signs in last 24 hours:  Temperature 97.5 pulse 55 respirations 20 blood pressure 156/87  Physical Exam: General:  No acute distress  Head:  Normocephalic, atraumatic.    Eyes:  Anicteric  Neck:  Tracheostomy in place  Lungs:   Scattered rhonchi, normal effort  Heart:  S1S2 no rubs  Abdomen:   Soft, nontender, bowel sounds present  Extremities:  2+ bilateral upper extremity edema  Neurologic:  Bilateral hand contractures, not following commands  Skin:  No acute skin rash  Access:  Left upper extremity AV fistula    Basic Metabolic Panel: Recent Labs  Lab 12/16/20 1031 12/18/20 0640 12/21/20 1804 12/23/20 0615  NA 133* 130* 132* 131*  K 3.3* 4.1 3.3* 3.5  CL 98 94* 95* 95*  CO2 27 26 29 27   GLUCOSE 100* 174* 127* 184*  BUN 24* 55* 37* 59*  CREATININE 1.22 2.22* 1.64* 2.35*  CALCIUM 8.9 8.9 8.5* 8.7*  PHOS 1.2* 2.5 1.7* 2.7     Liver Function Tests: Recent Labs  Lab 12/16/20 1031 12/18/20 0640 12/21/20 1804 12/23/20 0615  ALBUMIN 1.7* 1.6* <1.5* <1.5*    No results for input(s): LIPASE, AMYLASE in the last 168 hours. No results for input(s): AMMONIA in the last 168 hours.  CBC: Recent Labs  Lab 12/16/20 1031 12/18/20 0640 12/19/20 0609 12/21/20 1804 12/22/20 1326 12/23/20 0500  WBC 8.4 11.0* 17.1* 14.7*  --  12.0*  HGB 7.9* 7.2* 8.3* 6.9* 7.3* 7.2*  HCT 25.7* 24.1* 26.6* 21.8* 24.2* 23.9*  MCV 80.1 80.9 80.1 79.0*  --  78.9*  PLT 204 169 206 174  --  177     Cardiac Enzymes: No results for input(s): CKTOTAL, CKMB, CKMBINDEX, TROPONINI in the last 168 hours.  BNP: Invalid input(s): POCBNP  CBG: No results for input(s): GLUCAP in the last 168 hours.  Microbiology: Results for orders placed or performed during the hospital encounter of 07/17/20  Culture, blood (routine x 2)     Status: None    Collection Time: 07/27/20 11:17 AM   Specimen: BLOOD RIGHT ARM  Result Value Ref Range Status   Specimen Description BLOOD RIGHT ARM  Final   Special Requests   Final    BOTTLES DRAWN AEROBIC ONLY Blood Culture results may not be optimal due to an inadequate volume of blood received in culture bottles   Culture   Final    NO GROWTH 5 DAYS Performed at [facility], [place] [address] [place], [place] [address]    Report Status 08/01/2020 FINAL  Final  Culture, blood (routine x 2)     Status: None   Collection Time: 07/27/20 11:23 AM   Specimen: BLOOD RIGHT HAND  Result Value Ref Range Status   Specimen Description BLOOD RIGHT HAND  Final   Special Requests   Final    BOTTLES DRAWN AEROBIC ONLY Blood Culture results may not be optimal due to an inadequate volume of blood received in culture bottles   Culture   Final    NO GROWTH 5 DAYS Performed at [facility], [place] [address] [place], [place] [address]    Report Status 08/01/2020 FINAL  Final  Culture, blood (routine x 2)     Status: None   Collection Time: 08/01/20  2:25 PM   Specimen: BLOOD RIGHT HAND  Result Value Ref  Range Status   Specimen Description BLOOD RIGHT HAND  Final   Special Requests   Final    BOTTLES DRAWN AEROBIC AND ANAEROBIC Blood Culture results may not be optimal due to an inadequate volume of blood received in culture bottles   Culture   Final    NO GROWTH 5 DAYS Performed at [facility], [place] [address], [place], [place] [address]    Report Status 08/06/2020 FINAL  Final  Culture, blood (routine x 2)     Status: None   Collection Time: 08/01/20  2:39 PM   Specimen: BLOOD RIGHT ARM  Result Value Ref Range Status   Specimen Description BLOOD RIGHT ARM  Final   Special Requests   Final    BOTTLES DRAWN AEROBIC ONLY Blood Culture results may not be optimal due to an inadequate volume of blood received in culture bottles   Culture   Final    NO GROWTH 5 DAYS Performed at [facility], [place] [address], [place], [place] [address]    Report Status 08/06/2020 FINAL  Final  Culture, Respiratory w Gram Stain     Status: None   Collection Time: 08/01/20  2:55 PM   Specimen: Tracheal Aspirate; Respiratory  Result Value Ref Range Status   Specimen Description TRACHEAL ASPIRATE  Final   Special Requests NONE  Final   Gram Stain   Final    FEW WBC PRESENT, PREDOMINANTLY PMN MODERATE GRAM NEGATIVE RODS Performed at [facility], [place] [address], [place], [place] [address]    Culture ABUNDANT PSEUDOMONAS AERUGINOSA  Final   Report Status 08/04/2020 FINAL  Final   Organism ID, Bacteria PSEUDOMONAS AERUGINOSA  Final      Susceptibility   Pseudomonas aeruginosa - MIC*    CEFTAZIDIME <=1 SENSITIVE Sensitive     CIPROFLOXACIN <=0.25 SENSITIVE Sensitive     GENTAMICIN 8 INTERMEDIATE Intermediate     IMIPENEM >=16 RESISTANT Resistant     PIP/TAZO <=4 SENSITIVE Sensitive     CEFEPIME 2 SENSITIVE Sensitive     * ABUNDANT PSEUDOMONAS AERUGINOSA  Culture, Respiratory w Gram Stain     Status: None   Collection Time: 09/17/20  5:21 PM   Specimen: Tracheal Aspirate; Respiratory  Result Value Ref Range Status   Specimen Description TRACHEAL ASPIRATE  Final   Special Requests NONE  Final   Gram Stain   Final    ABUNDANT WBC PRESENT,BOTH PMN AND MONONUCLEAR MODERATE SQUAMOUS EPITHELIAL CELLS PRESENT MODERATE GRAM POSITIVE COCCI MODERATE GRAM NEGATIVE RODS Performed at [facility], [place] [address], [place], [place] [address]    Culture FEW PSEUDOMONAS AERUGINOSA  Final   Report Status 09/22/2020 FINAL  Final   Organism ID, Bacteria PSEUDOMONAS AERUGINOSA  Final      Susceptibility   Pseudomonas aeruginosa - MIC*    CEFTAZIDIME 2 SENSITIVE Sensitive     CIPROFLOXACIN 1 SENSITIVE Sensitive     GENTAMICIN >=16 RESISTANT Resistant     IMIPENEM >=16 RESISTANT Resistant     PIP/TAZO <=4 SENSITIVE Sensitive     * FEW PSEUDOMONAS AERUGINOSA  Culture, blood (routine x  2)     Status: None   Collection Time: 09/18/20 12:05 PM   Specimen: BLOOD RIGHT ARM  Result Value Ref Range Status   Specimen Description BLOOD RIGHT ARM  Final   Special Requests   Final    BOTTLES DRAWN AEROBIC AND ANAEROBIC Blood Culture adequate volume   Culture   Final  NO GROWTH 5 DAYS Performed at [facility], [place] [address] [place], [place] [address]    Report Status 09/23/2020 FINAL  Final  Culture, blood (routine x 2)     Status: None   Collection Time: 09/18/20 12:05 PM   Specimen: BLOOD RIGHT HAND  Result Value Ref Range Status   Specimen Description BLOOD RIGHT HAND  Final   Special Requests   Final    BOTTLES DRAWN AEROBIC AND ANAEROBIC Blood Culture adequate volume   Culture   Final    NO GROWTH 5 DAYS Performed at [facility], [place] [address], [place], [place] [address]    Report Status 09/23/2020 FINAL  Final  Culture, Respiratory w Gram Stain     Status: None   Collection Time: 09/29/20 12:06 PM   Specimen: Tracheal Aspirate; Respiratory  Result Value Ref Range Status   Specimen Description TRACHEAL ASPIRATE  Final   Special Requests NONE  Final   Gram Stain   Final    RARE SQUAMOUS EPITHELIAL CELLS PRESENT ABUNDANT WBC PRESENT,BOTH PMN AND MONONUCLEAR ABUNDANT GRAM NEGATIVE RODS    Culture   Final    FEW PSEUDOMONAS AERUGINOSA MULTI-DRUG RESISTANT ORGANISM CRITICAL RESULT CALLED TO, READ BACK BY AND VERIFIED WITH: Olar RN @1145  10/03/20 EB Performed at [facility], [place] [address], [place], [place] [address]    Report Status 10/03/2020 FINAL  Final   Organism ID, Bacteria PSEUDOMONAS AERUGINOSA  Final      Susceptibility   Pseudomonas aeruginosa - MIC*    CEFTAZIDIME 16 INTERMEDIATE Intermediate     CIPROFLOXACIN 0.5 SENSITIVE Sensitive     GENTAMICIN >=16 RESISTANT Resistant     IMIPENEM >=16 RESISTANT Resistant     * FEW PSEUDOMONAS AERUGINOSA  Culture, blood (routine x 2)     Status: Abnormal   Collection Time:  09/29/20  1:07 PM   Specimen: BLOOD RIGHT ARM  Result Value Ref Range Status   Specimen Description BLOOD RIGHT ARM  Final   Special Requests   Final    BOTTLES DRAWN AEROBIC AND ANAEROBIC Blood Culture adequate volume   Culture  Setup Time   Final    GRAM POSITIVE COCCI IN CHAINS AEROBIC BOTTLE ONLY CRITICAL RESULT CALLED TO, READ BACK BY AND VERIFIED WITH: RN S.SNED AT 1233 ON 09/30/2020 BY T.SAAD. Performed at [facility], [place] [address], [place], [place] [address]    Culture VANCOMYCIN RESISTANT ENTEROCOCCUS (A)  Final   Report Status 10/02/2020 FINAL  Final   Organism ID, Bacteria VANCOMYCIN RESISTANT ENTEROCOCCUS  Final      Susceptibility   Vancomycin resistant enterococcus - MIC*    AMPICILLIN <=2 SENSITIVE Sensitive     VANCOMYCIN >=32 RESISTANT Resistant     GENTAMICIN SYNERGY SENSITIVE Sensitive     LINEZOLID 2 SENSITIVE Sensitive     * VANCOMYCIN RESISTANT ENTEROCOCCUS  Blood Culture ID Panel (Reflexed)     Status: Abnormal   Collection Time: 09/29/20  1:07 PM  Result Value Ref Range Status   Enterococcus faecalis DETECTED (A) NOT DETECTED Final    Comment: CRITICAL RESULT CALLED TO, READ BACK BY AND VERIFIED WITH: RN S.SNED AT 1233 ON 09/30/2020 BY T.SAAD.    Enterococcus Faecium NOT DETECTED NOT DETECTED Final   Listeria monocytogenes NOT DETECTED NOT DETECTED Final   Staphylococcus species NOT DETECTED NOT DETECTED Final   Staphylococcus aureus (BCID) NOT DETECTED NOT DETECTED Final   Staphylococcus epidermidis NOT DETECTED NOT DETECTED Final  Staphylococcus lugdunensis NOT DETECTED NOT DETECTED Final   Streptococcus species NOT DETECTED NOT DETECTED Final   Streptococcus agalactiae NOT DETECTED NOT DETECTED Final   Streptococcus pneumoniae NOT DETECTED NOT DETECTED Final   Streptococcus pyogenes NOT DETECTED NOT DETECTED Final   A.calcoaceticus-baumannii NOT DETECTED NOT DETECTED Final   Bacteroides fragilis NOT DETECTED NOT DETECTED Final    Enterobacterales NOT DETECTED NOT DETECTED Final   Enterobacter cloacae complex NOT DETECTED NOT DETECTED Final   Escherichia coli NOT DETECTED NOT DETECTED Final   Klebsiella aerogenes NOT DETECTED NOT DETECTED Final   Klebsiella oxytoca NOT DETECTED NOT DETECTED Final   Klebsiella pneumoniae NOT DETECTED NOT DETECTED Final   Proteus species NOT DETECTED NOT DETECTED Final   Salmonella species NOT DETECTED NOT DETECTED Final   Serratia marcescens NOT DETECTED NOT DETECTED Final   Haemophilus influenzae NOT DETECTED NOT DETECTED Final   Neisseria meningitidis NOT DETECTED NOT DETECTED Final   Pseudomonas aeruginosa NOT DETECTED NOT DETECTED Final   Stenotrophomonas maltophilia NOT DETECTED NOT DETECTED Final   Candida albicans NOT DETECTED NOT DETECTED Final   Candida auris NOT DETECTED NOT DETECTED Final   Candida glabrata NOT DETECTED NOT DETECTED Final   Candida krusei NOT DETECTED NOT DETECTED Final   Candida parapsilosis NOT DETECTED NOT DETECTED Final   Candida tropicalis NOT DETECTED NOT DETECTED Final   Cryptococcus neoformans/gattii NOT DETECTED NOT DETECTED Final   Vancomycin resistance DETECTED (A) NOT DETECTED Final    Comment: CRITICAL RESULT CALLED TO, READ BACK BY AND VERIFIED WITH: RN S.SNED AT 1233 ON 09/30/2020 BY T.SAAD. Performed at [facility], [place] [address] [place], [place] [address]   Culture, blood (routine x 2)     Status: None   Collection Time: 09/29/20  1:14 PM   Specimen: BLOOD RIGHT ARM  Result Value Ref Range Status   Specimen Description BLOOD RIGHT ARM  Final   Special Requests   Final    BOTTLES DRAWN AEROBIC AND ANAEROBIC Blood Culture adequate volume   Culture   Final    NO GROWTH 5 DAYS Performed at [facility], [place] [address] [place], [place] [address]    Report Status 10/04/2020 FINAL  Final  Culture, blood (routine x 2)     Status: None   Collection Time: 10/20/20  9:57 AM   Specimen: BLOOD RIGHT HAND  Result Value Ref  Range Status   Specimen Description BLOOD RIGHT HAND  Final   Special Requests   Final    BOTTLES DRAWN AEROBIC ONLY Blood Culture adequate volume   Culture   Final    NO GROWTH 5 DAYS Performed at [facility], [place] [address] [place], [place] [address]    Report Status 10/25/2020 FINAL  Final  Culture, blood (routine x 2)     Status: None   Collection Time: 10/20/20  9:57 AM   Specimen: BLOOD RIGHT HAND  Result Value Ref Range Status   Specimen Description BLOOD RIGHT HAND  Final   Special Requests   Final    BOTTLES DRAWN AEROBIC ONLY Blood Culture adequate volume   Culture   Final    NO GROWTH 5 DAYS Performed at [facility], [place] [address] [place],  [address]    Report Status 10/25/2020 FINAL  Final  Culture, blood (routine x 2)     Status: None   Collection Time: 11/27/20  9:33 PM   Specimen: BLOOD RIGHT HAND  Result Value Ref  Range Status   Specimen Description BLOOD RIGHT HAND  Final   Special Requests AEROBIC BOTTLE ONLY Blood Culture adequate volume  Final   Culture   Final    NO GROWTH 5 DAYS Performed at [facility], [address], [place], [place] [address]    Report Status 12/02/2020 FINAL  Final  Culture, blood (routine x 2)     Status: None   Collection Time: 11/27/20  9:33 PM   Specimen: BLOOD  Result Value Ref Range Status   Specimen Description BLOOD RIGHT ANTECUBITAL  Final   Special Requests   Final    BOTTLES DRAWN AEROBIC AND ANAEROBIC Blood Culture adequate volume   Culture   Final    NO GROWTH 5 DAYS Performed at [facility], [place] [address], [place], [place] [address]    Report Status 12/02/2020 FINAL  Final  Culture, Respiratory w Gram Stain     Status: None   Collection Time: 12/02/20  3:29 PM   Specimen: Tracheal Aspirate; Respiratory  Result Value Ref Range Status   Specimen Description TRACHEAL ASPIRATE  Final   Special Requests NONE  Final   Gram Stain   Final    FEW SQUAMOUS EPITHELIAL CELLS  PRESENT FEW WBC SEEN FEW GRAM POSITIVE COCCI MODERATE GRAM NEGATIVE RODS Performed at [facility], [place] [address], [place], [place] [address]    Culture ABUNDANT PSEUDOMONAS AERUGINOSA  Final   Report Status 12/06/2020 FINAL  Final   Organism ID, Bacteria PSEUDOMONAS AERUGINOSA  Final      Susceptibility   Pseudomonas aeruginosa - MIC*    CEFTAZIDIME 2 SENSITIVE Sensitive     CIPROFLOXACIN 2 RESISTANT Resistant     GENTAMICIN >=16 RESISTANT Resistant     IMIPENEM >=16 RESISTANT Resistant     PIP/TAZO <=4 SENSITIVE Sensitive     * ABUNDANT PSEUDOMONAS AERUGINOSA    Coagulation Studies: No results for input(s): LABPROT, INR in the last 72 hours.   Urinalysis: No results for input(s): COLORURINE, LABSPEC, PHURINE, GLUCOSEU, HGBUR, BILIRUBINUR, KETONESUR, PROTEINUR, UROBILINOGEN, NITRITE, LEUKOCYTESUR in the last 72 hours.  Invalid input(s): APPERANCEUR    Imaging: No results found.   Medications:       Assessment/ Plan:  45 y.o. male with a PMHx of ESRD on HD, posterior reversible encephalopathy syndrome, diabetes mellitus type 2, chronic systolic heart failure, anemia of chronic kidney disease, secondary hyperparathyroidism, malignant hypertension, acute respiratory failure, who was admitted to [facility] on 06/29/2020 for ongoing treatment of acute respiratory failure, posterior reversible encephalopathy syndrome, and end-stage renal disease.   1.  ESRD on HD.  Patient maintained on 3 times a week hemodialysis treatments on Monday Wednesdays and Fridays.  Due for dialysis treatment today.  2.  Anemia of chronic kidney disease.  Hemoglobin down to 7.2.  Received blood transfusion earlier in the week.  Maintain the patient on current dosage of Retacrit.  3.  Secondary hyperparathyroidism.  Phosphorus 2.7 and acceptable.  4.  Acute respiratory failure.  Tracheostomy placed 07/17/2020.  Patient continues to be on T-piece collar.  Pseudomonas grew in respiratory  culture.  Respiratory status appears to be stable.  5.  Hypertension.  Blood pressure currently 156/87 and better.  He remains on multiple antihypertensives including amlodipine, clonidine oral and patch, hydralazine, labetalol, losartan, and terazosin.   Aspynn Clover 12/21/20228:00 AM

## 2020-12-25 LAB — RENAL FUNCTION PANEL
Albumin: <1.5 g/dL — ABNORMAL LOW (ref 3.5–5.0)
Anion gap: 7 (ref 5–15)
BUN: 37 mg/dL — ABNORMAL HIGH (ref 6–20)
CO2: 28 mmol/L (ref 22–32)
Calcium: 8.1 mg/dL — ABNORMAL LOW (ref 8.9–10.3)
Chloride: 97 mmol/L — ABNORMAL LOW (ref 98–111)
Creatinine, Ser: 1.7 mg/dL — ABNORMAL HIGH (ref 0.61–1.24)
GFR, Estimated: 50 mL/min — ABNORMAL LOW (ref >60)
Glucose, Bld: 172 mg/dL — ABNORMAL HIGH (ref 70–99)
Phosphorus: 1.6 mg/dL — ABNORMAL LOW (ref 2.5–4.6)
Potassium: 3.3 mmol/L — ABNORMAL LOW (ref 3.5–5.1)
Sodium: 132 mmol/L — ABNORMAL LOW (ref 135–145)

## 2020-12-25 LAB — CBC
HCT: 23 % — ABNORMAL LOW (ref 39.0–52.0)
Hemoglobin: 7.2 g/dL — ABNORMAL LOW (ref 13.0–17.0)
MCH: 24.5 pg — ABNORMAL LOW (ref 26.0–34.0)
MCHC: 31.3 g/dL (ref 30.0–36.0)
MCV: 78.2 fL — ABNORMAL LOW (ref 80.0–100.0)
Platelets: 149 10*3/uL — ABNORMAL LOW (ref 150–400)
RBC: 2.94 MIL/uL — ABNORMAL LOW (ref 4.22–5.81)
RDW: 15.9 % — ABNORMAL HIGH (ref 11.5–15.5)
WBC: 15.3 10*3/uL — ABNORMAL HIGH (ref 4.0–10.5)
nRBC: 0 % (ref 0.0–0.2)

## 2020-12-25 NOTE — Progress Notes (Signed)
[facility]  ROUNDING NOTE   Subjective:  Patient has completed treatment today.  He will be transitioning to a facility in [facility]. Assisted with the is able to take care of patients with tracheostomies, PEG tube, and 4 on hemodialysis.     Objective:  Vital signs in last 24 hours:  Temperature 98.4 pulse 62 respiration 17 blood pressure 146/79  Physical Exam: General:  No acute distress  Head:  Normocephalic, atraumatic.    Eyes:  Anicteric  Neck:  Tracheostomy in place  Lungs:   Scattered rhonchi, normal effort  Heart:  S1S2 no rubs  Abdomen:   Soft, nontender, bowel sounds present  Extremities:  2+ bilateral upper extremity edema  Neurologic:  Bilateral hand contractures, not following commands, upward gaze  Skin:  No acute skin rash  Access:  Left upper extremity AV fistula    Basic Metabolic Panel: Recent Labs  Lab 12/21/20 1804 12/23/20 0615 12/25/20 0427  NA 132* 131* 132*  K 3.3* 3.5 3.3*  CL 95* 95* 97*  CO2 29 27 28   GLUCOSE 127* 184* 172*  BUN 37* 59* 37*  CREATININE 1.64* 2.35* 1.70*  CALCIUM 8.5* 8.7* 8.1*  PHOS 1.7* 2.7 1.6*     Liver Function Tests: Recent Labs  Lab 12/21/20 1804 12/23/20 0615 12/25/20 0427  ALBUMIN <1.5* <1.5* <1.5*    No results for input(s): LIPASE, AMYLASE in the last 168 hours. No results for input(s): AMMONIA in the last 168 hours.  CBC: Recent Labs  Lab 12/19/20 0609 12/21/20 1804 12/22/20 1326 12/23/20 0500 12/25/20 0427  WBC 17.1* 14.7*  --  12.0* 15.3*  HGB 8.3* 6.9* 7.3* 7.2* 7.2*  HCT 26.6* 21.8* 24.2* 23.9* 23.0*  MCV 80.1 79.0*  --  78.9* 78.2*  PLT 206 174  --  177 149*     Cardiac Enzymes: No results for input(s): CKTOTAL, CKMB, CKMBINDEX, TROPONINI in the last 168 hours.  BNP: Invalid input(s): POCBNP  CBG: No results for input(s): GLUCAP in the last 168 hours.  Microbiology: Results for orders placed or performed during the hospital encounter of 07/17/20   Culture, blood (routine x 2)     Status: None   Collection Time: 07/27/20 11:17 AM   Specimen: BLOOD RIGHT ARM  Result Value Ref Range Status   Specimen Description BLOOD RIGHT ARM  Final   Special Requests   Final    BOTTLES DRAWN AEROBIC ONLY Blood Culture results may not be optimal due to an inadequate volume of blood received in culture bottles   Culture   Final    NO GROWTH 5 DAYS Performed at [facility], [place] [address] [place], [place] [address]    Report Status 08/01/2020 FINAL  Final  Culture, blood (routine x 2)     Status: None   Collection Time: 07/27/20 11:23 AM   Specimen: BLOOD RIGHT HAND  Result Value Ref Range Status   Specimen Description BLOOD RIGHT HAND  Final   Special Requests   Final    BOTTLES DRAWN AEROBIC ONLY Blood Culture results may not be optimal due to an inadequate volume of blood received in culture bottles   Culture   Final    NO GROWTH 5 DAYS Performed at [facility], [place] [address] [place], [place] [address]    Report Status 08/01/2020 FINAL  Final  Culture, blood (routine x 2)     Status: None   Collection Time: 08/01/20  2:25 PM   Specimen:  BLOOD RIGHT HAND  Result Value Ref Range Status   Specimen Description BLOOD RIGHT HAND  Final   Special Requests   Final    BOTTLES DRAWN AEROBIC AND ANAEROBIC Blood Culture results may not be optimal due to an inadequate volume of blood received in culture bottles   Culture   Final    NO GROWTH 5 DAYS Performed at [facility], [place] [address] [place], [place] [address]    Report Status 08/06/2020 FINAL  Final  Culture, blood (routine x 2)     Status: None   Collection Time: 08/01/20  2:39 PM   Specimen: BLOOD RIGHT ARM  Result Value Ref Range Status   Specimen Description BLOOD RIGHT ARM  Final   Special Requests   Final    BOTTLES DRAWN AEROBIC ONLY Blood Culture results may not be optimal due to an inadequate volume of blood received in culture bottles   Culture   Final     NO GROWTH 5 DAYS Performed at [facility], [place] [address] [place], [place] [address]    Report Status 08/06/2020 FINAL  Final  Culture, Respiratory w Gram Stain     Status: None   Collection Time: 08/01/20  2:55 PM   Specimen: Tracheal Aspirate; Respiratory  Result Value Ref Range Status   Specimen Description TRACHEAL ASPIRATE  Final   Special Requests NONE  Final   Gram Stain   Final    FEW WBC PRESENT, PREDOMINANTLY PMN MODERATE GRAM NEGATIVE RODS Performed at [facility], [place] [address] [place], [place] [address]    Culture ABUNDANT PSEUDOMONAS AERUGINOSA  Final   Report Status 08/04/2020 FINAL  Final   Organism ID, Bacteria PSEUDOMONAS AERUGINOSA  Final      Susceptibility   Pseudomonas aeruginosa - MIC*    CEFTAZIDIME <=1 SENSITIVE Sensitive     CIPROFLOXACIN <=0.25 SENSITIVE Sensitive     GENTAMICIN 8 INTERMEDIATE Intermediate     IMIPENEM >=16 RESISTANT Resistant     PIP/TAZO <=4 SENSITIVE Sensitive     CEFEPIME 2 SENSITIVE Sensitive     * ABUNDANT PSEUDOMONAS AERUGINOSA  Culture, Respiratory w Gram Stain     Status: None   Collection Time: 09/17/20  5:21 PM   Specimen: Tracheal Aspirate; Respiratory  Result Value Ref Range Status   Specimen Description TRACHEAL ASPIRATE  Final   Special Requests NONE  Final   Gram Stain   Final    ABUNDANT WBC PRESENT,BOTH PMN AND MONONUCLEAR MODERATE SQUAMOUS EPITHELIAL CELLS PRESENT MODERATE GRAM POSITIVE COCCI MODERATE GRAM NEGATIVE RODS Performed at [facility], [place] [address] [place], [place] [address]    Culture FEW PSEUDOMONAS AERUGINOSA  Final   Report Status 09/22/2020 FINAL  Final   Organism ID, Bacteria PSEUDOMONAS AERUGINOSA  Final      Susceptibility   Pseudomonas aeruginosa - MIC*    CEFTAZIDIME 2 SENSITIVE Sensitive     CIPROFLOXACIN 1 SENSITIVE Sensitive     GENTAMICIN >=16 RESISTANT Resistant     IMIPENEM >=16 RESISTANT Resistant     PIP/TAZO <=4 SENSITIVE Sensitive     * FEW  PSEUDOMONAS AERUGINOSA  Culture, blood (routine x 2)     Status: None   Collection Time: 09/18/20 12:05 PM   Specimen: BLOOD RIGHT ARM  Result Value Ref Range Status   Specimen Description BLOOD RIGHT ARM  Final   Special Requests   Final    BOTTLES DRAWN AEROBIC AND ANAEROBIC Blood Culture adequate  volume   Culture   Final    NO GROWTH 5 DAYS Performed at [facility], [place] [address], [place], [place] [address]    Report Status 09/23/2020 FINAL  Final  Culture, blood (routine x 2)     Status: None   Collection Time: 09/18/20 12:05 PM   Specimen: BLOOD RIGHT HAND  Result Value Ref Range Status   Specimen Description BLOOD RIGHT HAND  Final   Special Requests   Final    BOTTLES DRAWN AEROBIC AND ANAEROBIC Blood Culture adequate volume   Culture   Final    NO GROWTH 5 DAYS Performed at [facility], [place] [address] [place], [place] [address]    Report Status 09/23/2020 FINAL  Final  Culture, Respiratory w Gram Stain     Status: None   Collection Time: 09/29/20 12:06 PM   Specimen: Tracheal Aspirate; Respiratory  Result Value Ref Range Status   Specimen Description TRACHEAL ASPIRATE  Final   Special Requests NONE  Final   Gram Stain   Final    RARE SQUAMOUS EPITHELIAL CELLS PRESENT ABUNDANT WBC PRESENT,BOTH PMN AND MONONUCLEAR ABUNDANT GRAM NEGATIVE RODS    Culture   Final    FEW PSEUDOMONAS AERUGINOSA MULTI-DRUG RESISTANT ORGANISM CRITICAL RESULT CALLED TO, READ BACK BY AND VERIFIED WITH: [place] RN @1145  10/03/20 EB Performed at [facility], [place] [address], [place], [place] [address]    Report Status 10/03/2020 FINAL  Final   Organism ID, Bacteria PSEUDOMONAS AERUGINOSA  Final      Susceptibility   Pseudomonas aeruginosa - MIC*    CEFTAZIDIME 16 INTERMEDIATE Intermediate     CIPROFLOXACIN 0.5 SENSITIVE Sensitive     GENTAMICIN >=16 RESISTANT Resistant     IMIPENEM >=16 RESISTANT Resistant     * FEW PSEUDOMONAS AERUGINOSA  Culture, blood (routine x  2)     Status: Abnormal   Collection Time: 09/29/20  1:07 PM   Specimen: BLOOD RIGHT ARM  Result Value Ref Range Status   Specimen Description BLOOD RIGHT ARM  Final   Special Requests   Final    BOTTLES DRAWN AEROBIC AND ANAEROBIC Blood Culture adequate volume   Culture  Setup Time   Final    GRAM POSITIVE COCCI IN CHAINS AEROBIC BOTTLE ONLY CRITICAL RESULT CALLED TO, READ BACK BY AND VERIFIED WITH: RN S.SNED AT 1233 ON 09/30/2020 BY T.SAAD. Performed at [facility], [place] [address] [place] [address] [place], [place] [address]    Culture VANCOMYCIN RESISTANT ENTEROCOCCUS (A)  Final   Report Status 10/02/2020 FINAL  Final   Organism ID, Bacteria VANCOMYCIN RESISTANT ENTEROCOCCUS  Final      Susceptibility   Vancomycin resistant enterococcus - MIC*    AMPICILLIN <=2 SENSITIVE Sensitive     VANCOMYCIN >=32 RESISTANT Resistant     GENTAMICIN SYNERGY SENSITIVE Sensitive     LINEZOLID 2 SENSITIVE Sensitive     * VANCOMYCIN RESISTANT ENTEROCOCCUS  Blood Culture ID Panel (Reflexed)     Status: Abnormal   Collection Time: 09/29/20  1:07 PM  Result Value Ref Range Status   Enterococcus faecalis DETECTED (A) NOT DETECTED Final    Comment: CRITICAL RESULT CALLED TO, READ BACK BY AND VERIFIED WITH: RN S.SNED AT 1233 ON 09/30/2020 BY T.SAAD.    Enterococcus Faecium NOT DETECTED NOT DETECTED Final   Listeria monocytogenes NOT DETECTED NOT DETECTED Final   Staphylococcus species NOT DETECTED NOT DETECTED Final   Staphylococcus aureus (BCID) NOT DETECTED NOT DETECTED  Final   Staphylococcus epidermidis NOT DETECTED NOT DETECTED Final   Staphylococcus lugdunensis NOT DETECTED NOT DETECTED Final   Streptococcus species NOT DETECTED NOT DETECTED Final   Streptococcus agalactiae NOT DETECTED NOT DETECTED Final   Streptococcus pneumoniae NOT DETECTED NOT DETECTED Final   Streptococcus pyogenes NOT DETECTED NOT DETECTED Final   A.calcoaceticus-baumannii NOT DETECTED NOT DETECTED Final   Bacteroides fragilis  NOT DETECTED NOT DETECTED Final   Enterobacterales NOT DETECTED NOT DETECTED Final   Enterobacter cloacae complex NOT DETECTED NOT DETECTED Final   Escherichia coli NOT DETECTED NOT DETECTED Final   Klebsiella aerogenes NOT DETECTED NOT DETECTED Final   Klebsiella oxytoca NOT DETECTED NOT DETECTED Final   Klebsiella pneumoniae NOT DETECTED NOT DETECTED Final   Proteus species NOT DETECTED NOT DETECTED Final   Salmonella species NOT DETECTED NOT DETECTED Final   Serratia marcescens NOT DETECTED NOT DETECTED Final   Haemophilus influenzae NOT DETECTED NOT DETECTED Final   Neisseria meningitidis NOT DETECTED NOT DETECTED Final   Pseudomonas aeruginosa NOT DETECTED NOT DETECTED Final   Stenotrophomonas maltophilia NOT DETECTED NOT DETECTED Final   Candida albicans NOT DETECTED NOT DETECTED Final   Candida auris NOT DETECTED NOT DETECTED Final   Candida glabrata NOT DETECTED NOT DETECTED Final   Candida krusei NOT DETECTED NOT DETECTED Final   Candida parapsilosis NOT DETECTED NOT DETECTED Final   Candida tropicalis NOT DETECTED NOT DETECTED Final   Cryptococcus neoformans/gattii NOT DETECTED NOT DETECTED Final   Vancomycin resistance DETECTED (A) NOT DETECTED Final    Comment: CRITICAL RESULT CALLED TO, READ BACK BY AND VERIFIED WITH: RN S.SNED AT 1233 ON 09/30/2020 BY T.SAAD. Performed at [facility], [place] [address] [place], [place] [address]   Culture, blood (routine x 2)     Status: None   Collection Time: 09/29/20  1:14 PM   Specimen: BLOOD RIGHT ARM  Result Value Ref Range Status   Specimen Description BLOOD RIGHT ARM  Final   Special Requests   Final    BOTTLES DRAWN AEROBIC AND ANAEROBIC Blood Culture adequate volume   Culture   Final    NO GROWTH 5 DAYS Performed at [facility], [place] [address] [place], [place] [address]    Report Status 10/04/2020 FINAL  Final  Culture, blood (routine x 2)     Status: None   Collection Time: 10/20/20  9:57 AM   Specimen:  BLOOD RIGHT HAND  Result Value Ref Range Status   Specimen Description BLOOD RIGHT HAND  Final   Special Requests   Final    BOTTLES DRAWN AEROBIC ONLY Blood Culture adequate volume   Culture   Final    NO GROWTH 5 DAYS Performed at [facility], [place] [address], [place], [place] [address]    Report Status 10/25/2020 FINAL  Final  Culture, blood (routine x 2)     Status: None   Collection Time: 10/20/20  9:57 AM   Specimen: BLOOD RIGHT HAND  Result Value Ref Range Status   Specimen Description BLOOD RIGHT HAND  Final   Special Requests   Final    BOTTLES DRAWN AEROBIC ONLY Blood Culture adequate volume   Culture   Final    NO GROWTH 5 DAYS Performed at [facility], [place] [address], [place], [place] [address]    Report Status 10/25/2020 FINAL  Final  Culture, blood (routine x 2)     Status: None   Collection Time: 11/27/20  9:33 PM   Specimen: BLOOD RIGHT HAND  Result Value Ref Range Status   Specimen Description BLOOD RIGHT HAND  Final   Special Requests AEROBIC BOTTLE ONLY Blood Culture adequate volume  Final   Culture   Final    NO GROWTH 5 DAYS Performed at [facility], [address], [place], [place] [address]    Report Status 12/02/2020 FINAL  Final  Culture, blood (routine x 2)     Status: None   Collection Time: 11/27/20  9:33 PM   Specimen: BLOOD  Result Value Ref Range Status   Specimen Description BLOOD RIGHT ANTECUBITAL  Final   Special Requests   Final    BOTTLES DRAWN AEROBIC AND ANAEROBIC Blood Culture adequate volume   Culture   Final    NO GROWTH 5 DAYS Performed at [facility], [place] [address] [place], [place] [address]    Report Status 12/02/2020 FINAL  Final  Culture, Respiratory w Gram Stain     Status: None   Collection Time: 12/02/20  3:29 PM   Specimen: Tracheal Aspirate; Respiratory  Result Value Ref Range Status   Specimen Description TRACHEAL ASPIRATE  Final   Special Requests NONE  Final   Gram Stain   Final     FEW SQUAMOUS EPITHELIAL CELLS PRESENT FEW WBC SEEN FEW GRAM POSITIVE COCCI MODERATE GRAM NEGATIVE RODS Performed at [facility], [place] [address] [place], [place] [address]    Culture ABUNDANT PSEUDOMONAS AERUGINOSA  Final   Report Status 12/06/2020 FINAL  Final   Organism ID, Bacteria PSEUDOMONAS AERUGINOSA  Final      Susceptibility   Pseudomonas aeruginosa - MIC*    CEFTAZIDIME 2 SENSITIVE Sensitive     CIPROFLOXACIN 2 RESISTANT Resistant     GENTAMICIN >=16 RESISTANT Resistant     IMIPENEM >=16 RESISTANT Resistant     PIP/TAZO <=4 SENSITIVE Sensitive     * ABUNDANT PSEUDOMONAS AERUGINOSA    Coagulation Studies: No results for input(s): LABPROT, INR in the last 72 hours.   Urinalysis: No results for input(s): COLORURINE, LABSPEC, PHURINE, GLUCOSEU, HGBUR, BILIRUBINUR, KETONESUR, PROTEINUR, UROBILINOGEN, NITRITE, LEUKOCYTESUR in the last 72 hours.  Invalid input(s): APPERANCEUR    Imaging: No results found.   Medications:       Assessment/ Plan:  45 y.o. male with a PMHx of ESRD on HD, posterior reversible encephalopathy syndrome, diabetes mellitus type 2, chronic systolic heart failure, anemia of chronic kidney disease, secondary hyperparathyroidism, malignant hypertension, acute respiratory failure, who was admitted to [facility] on 06/29/2020 for ongoing treatment of acute respiratory failure, posterior reversible encephalopathy syndrome, and end-stage renal disease.   1.  ESRD on HD.  Patient completed hemodialysis treatment today.  He is being transitioned to a facility in [facility] that is able to take care of patients that have tracheostomy, PEG tube, and that are on hemodialysis.   2.  Anemia of chronic kidney disease.  Hemoglobin still remains low at 7.2.  He has required blood transfusion periodically.  Has been rather resistant to erythropoietin stimulating agents.  3.  Secondary hyperparathyroidism.  Continue to monitor bone mineral metabolism  parameters.  Has not required binders.  4.  Acute respiratory failure.  Tracheostomy placed 07/17/2020.  Patient continues to be on T-piece collar.  Pseudomonas grew in respiratory culture.    5.  Hypertension.  Blood pressure improved today down to 146/79.  He remains on multiple antihypertensives including amlodipine, clonidine oral and patch, hydralazine, labetalol,  losartan, and terazosin.   Langston [place] 12/23/20229:18 AM

## 2021-02-03 DEATH — deceased

## 2023-01-24 IMAGING — DX DG CHEST 1V PORT
1 series · 1 of 1 positions shown · non-contrast
Comparison: Earlier the same date and 06/29/2020.

CLINICAL DATA: PICC line repositioning.

EXAM:
PORTABLE CHEST 1 VIEW

[chest ap]
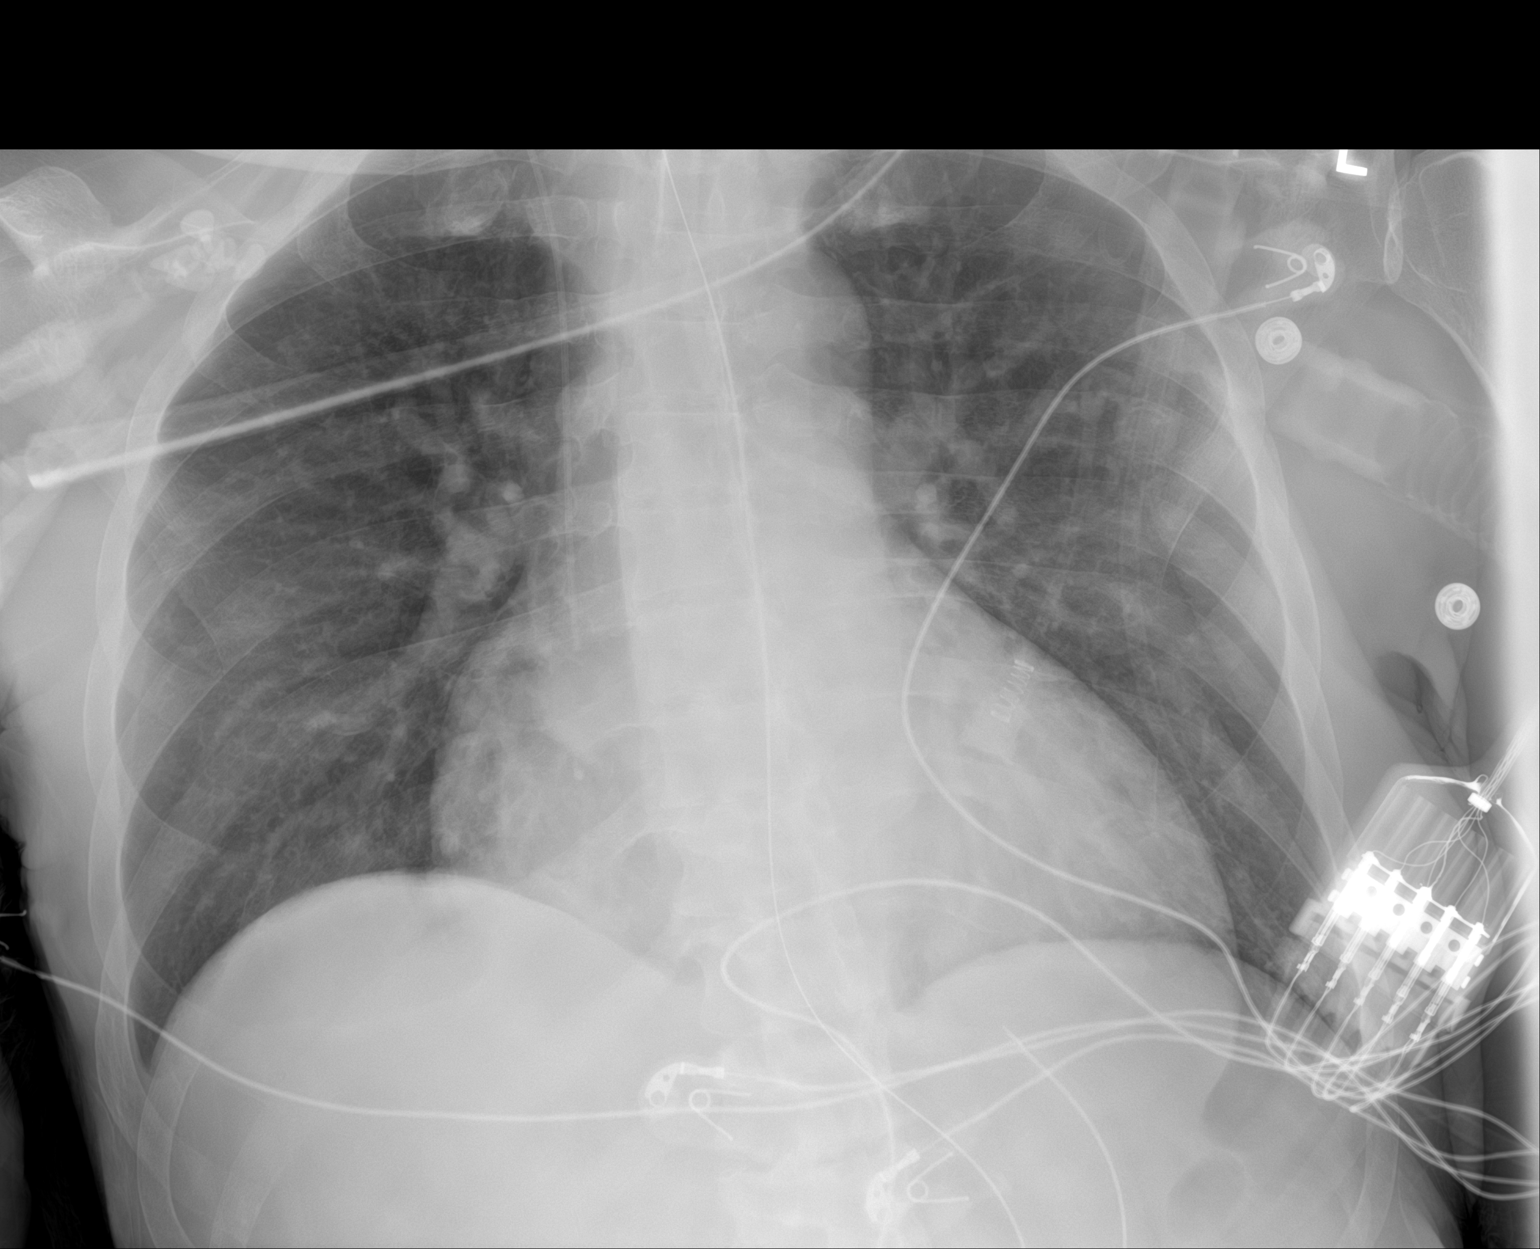

[1 of 1 positions shown; findings below may reference images not displayed]

FINDINGS: 2353 hours. Right arm PICC has been redirected with the tip
overlying the superior cavoatrial junction. The endotracheal and
enteric tubes are unchanged in position. The lungs appear clear.
There is no pleural effusion or pneumothorax. The heart size and
mediastinal contours are unchanged.
IMPRESSION: PICC line has been redirected with the tip projecting over the
superior cavoatrial junction.

## 2023-06-15 IMAGING — DX DG CHEST 1V PORT
1 series · 1 of 1 positions shown · non-contrast
Comparison: 10/03/2020

CLINICAL DATA: Fever and leukocytosis

EXAM:
PORTABLE CHEST 1 VIEW

[chest]
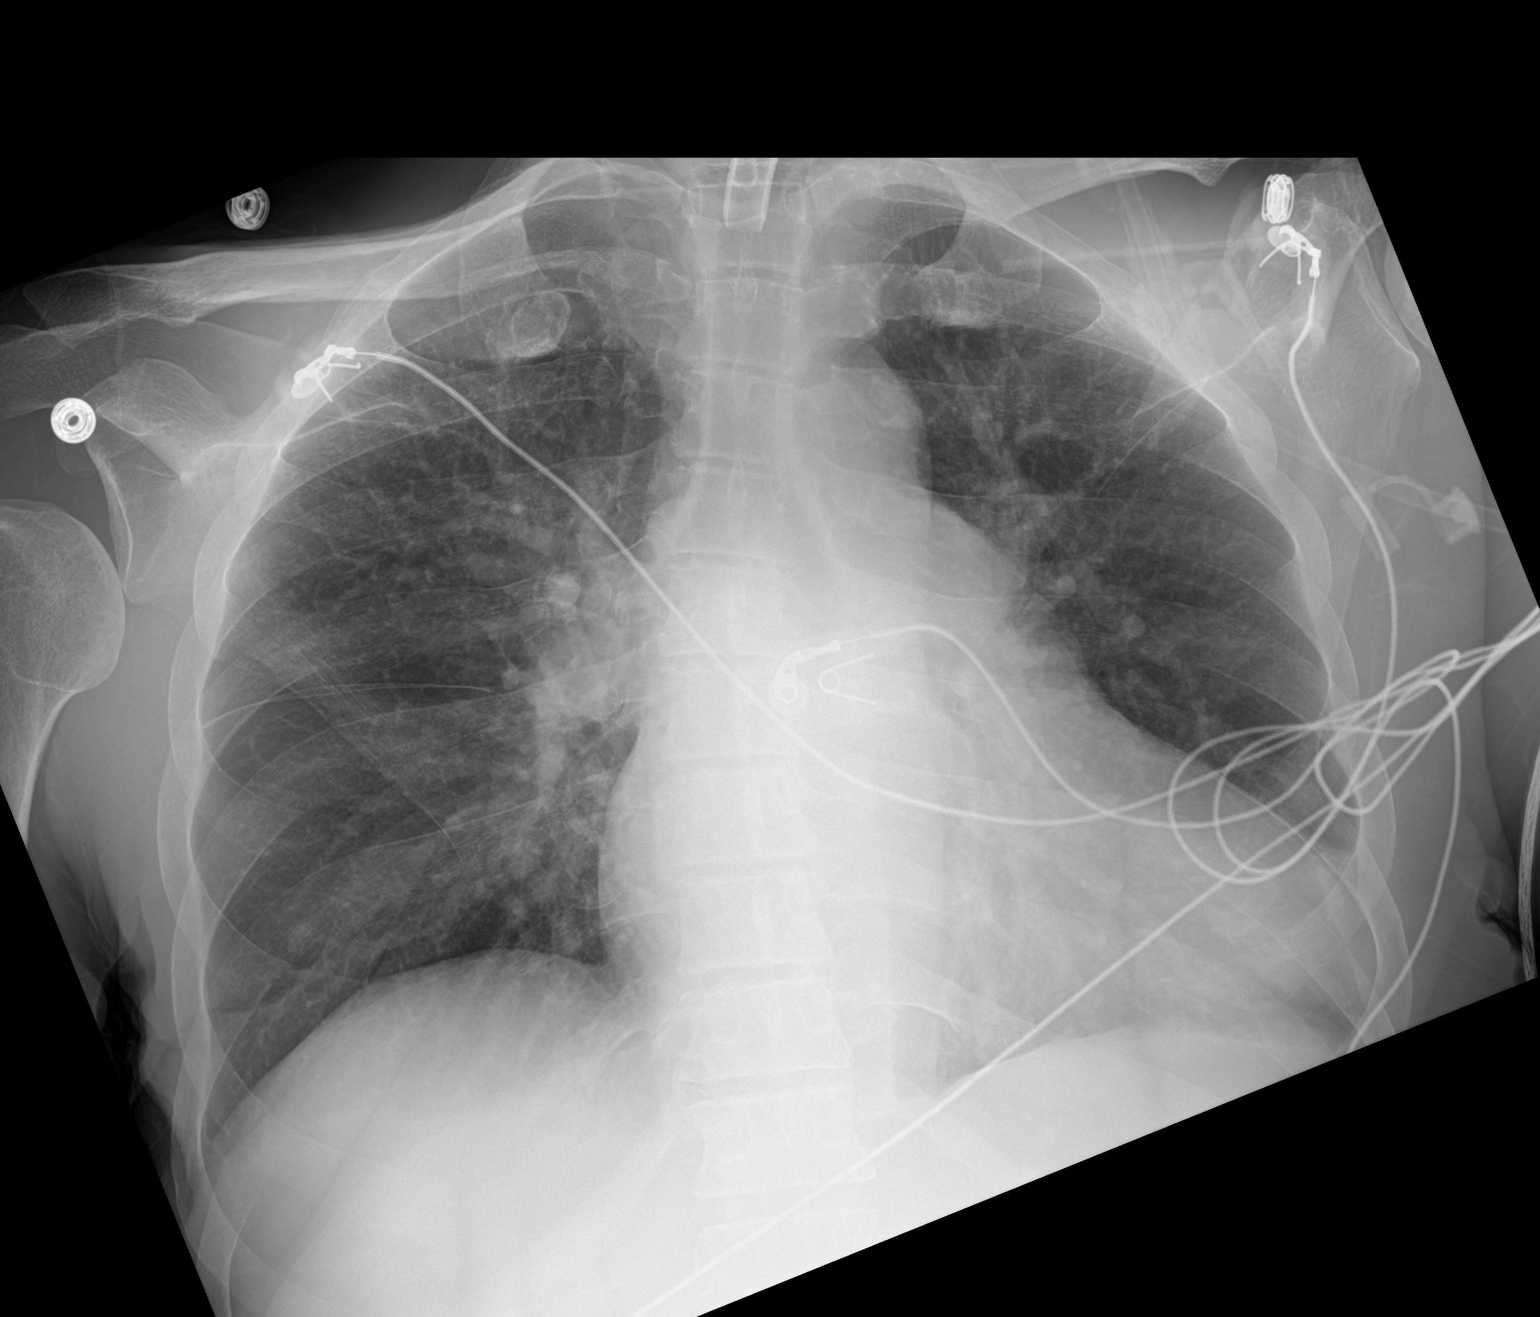

[1 of 1 positions shown; findings below may reference images not displayed]

FINDINGS: Mild enlargement of the cardiopericardial silhouette, without edema.
Upper zone pulmonary vascular prominence may be due to semi erect
positioning. Tracheostomy tube noted. No compelling findings of
pneumonia.
IMPRESSION: 1. Mild enlargement of the cardiopericardial silhouette, stable,
without edema.
